# Patient Record
Sex: Female | Born: 1940 | Race: White | Hispanic: No | Marital: Single | Smoking: Never smoker
Health system: Southern US, Community
[De-identification: ages and names within clinical notes are randomized; demographics above are authoritative.]

## PROBLEM LIST (undated history)

## (undated) DIAGNOSIS — N289 Disorder of kidney and ureter, unspecified: Secondary | ICD-10-CM

## (undated) DIAGNOSIS — S82091A Other fracture of right patella, initial encounter for closed fracture: Secondary | ICD-10-CM

## (undated) DIAGNOSIS — I82409 Acute embolism and thrombosis of unspecified deep veins of unspecified lower extremity: Secondary | ICD-10-CM

## (undated) DIAGNOSIS — I10 Essential (primary) hypertension: Secondary | ICD-10-CM

## (undated) DIAGNOSIS — E118 Type 2 diabetes mellitus with unspecified complications: Secondary | ICD-10-CM

## (undated) DIAGNOSIS — I5042 Chronic combined systolic (congestive) and diastolic (congestive) heart failure: Secondary | ICD-10-CM

## (undated) DIAGNOSIS — F419 Anxiety disorder, unspecified: Secondary | ICD-10-CM

## (undated) DIAGNOSIS — I2510 Atherosclerotic heart disease of native coronary artery without angina pectoris: Secondary | ICD-10-CM

## (undated) DIAGNOSIS — F329 Major depressive disorder, single episode, unspecified: Secondary | ICD-10-CM

## (undated) DIAGNOSIS — M1990 Unspecified osteoarthritis, unspecified site: Secondary | ICD-10-CM

## (undated) DIAGNOSIS — F32A Depression, unspecified: Secondary | ICD-10-CM

## (undated) DIAGNOSIS — E785 Hyperlipidemia, unspecified: Secondary | ICD-10-CM

## (undated) DIAGNOSIS — G20 Parkinson's disease: Secondary | ICD-10-CM

## (undated) DIAGNOSIS — J449 Chronic obstructive pulmonary disease, unspecified: Secondary | ICD-10-CM

## (undated) DIAGNOSIS — N184 Chronic kidney disease, stage 4 (severe): Secondary | ICD-10-CM

## (undated) DIAGNOSIS — G20A1 Parkinson's disease without dyskinesia, without mention of fluctuations: Secondary | ICD-10-CM

## (undated) HISTORY — DX: Type 2 diabetes mellitus with unspecified complications: E11.8

## (undated) HISTORY — PX: NECK SURGERY: SHX720

## (undated) HISTORY — DX: Acute embolism and thrombosis of unspecified deep veins of unspecified lower extremity: I82.409

## (undated) HISTORY — PX: BACK SURGERY: SHX140

## (undated) HISTORY — DX: Major depressive disorder, single episode, unspecified: F32.9

## (undated) HISTORY — DX: Depression, unspecified: F32.A

## (undated) HISTORY — DX: Anxiety disorder, unspecified: F41.9

## (undated) HISTORY — DX: Unspecified osteoarthritis, unspecified site: M19.90

## (undated) HISTORY — DX: Chronic combined systolic (congestive) and diastolic (congestive) heart failure: I50.42

## (undated) HISTORY — PX: CORONARY ANGIOPLASTY WITH STENT PLACEMENT: SHX49

---

## 2016-07-12 ENCOUNTER — Emergency Department
Admission: EM | Admit: 2016-07-12 | Discharge: 2016-07-12 | Disposition: A | Discharge status: Home / Self Care | Payer: Medicare Other | Attending: Emergency Medicine | Admitting: Emergency Medicine

## 2016-07-12 ENCOUNTER — Encounter: Payer: Self-pay | Admitting: Emergency Medicine

## 2016-07-12 ENCOUNTER — Emergency Department: Payer: Medicare Other

## 2016-07-12 DIAGNOSIS — N189 Chronic kidney disease, unspecified: Secondary | ICD-10-CM | POA: Diagnosis not present

## 2016-07-12 DIAGNOSIS — N950 Postmenopausal bleeding: Secondary | ICD-10-CM | POA: Diagnosis not present

## 2016-07-12 DIAGNOSIS — I2510 Atherosclerotic heart disease of native coronary artery without angina pectoris: Secondary | ICD-10-CM | POA: Diagnosis not present

## 2016-07-12 DIAGNOSIS — N939 Abnormal uterine and vaginal bleeding, unspecified: Secondary | ICD-10-CM | POA: Diagnosis present

## 2016-07-12 DIAGNOSIS — I129 Hypertensive chronic kidney disease with stage 1 through stage 4 chronic kidney disease, or unspecified chronic kidney disease: Secondary | ICD-10-CM | POA: Insufficient documentation

## 2016-07-12 HISTORY — DX: Essential (primary) hypertension: I10

## 2016-07-12 HISTORY — DX: Atherosclerotic heart disease of native coronary artery without angina pectoris: I25.10

## 2016-07-12 LAB — COMPREHENSIVE METABOLIC PANEL
ALBUMIN: 3.7 g/dL (ref 3.5–5.0)
ALT: 10 U/L — AB (ref 14–54)
AST: 14 U/L — AB (ref 15–41)
Alkaline Phosphatase: 61 U/L (ref 38–126)
Anion gap: 3 — ABNORMAL LOW (ref 5–15)
BUN: 40 mg/dL — AB (ref 6–20)
CHLORIDE: 103 mmol/L (ref 101–111)
CO2: 32 mmol/L (ref 22–32)
Calcium: 8.8 mg/dL — ABNORMAL LOW (ref 8.9–10.3)
Creatinine, Ser: 2.11 mg/dL — ABNORMAL HIGH (ref 0.44–1.00)
GFR calc Af Amer: 25 mL/min — ABNORMAL LOW (ref >60)
GFR, EST NON AFRICAN AMERICAN: 22 mL/min — AB (ref >60)
GLUCOSE: 365 mg/dL — AB (ref 65–99)
POTASSIUM: 4.6 mmol/L (ref 3.5–5.1)
SODIUM: 138 mmol/L (ref 135–145)
Total Bilirubin: 1.5 mg/dL — ABNORMAL HIGH (ref 0.3–1.2)
Total Protein: 6.5 g/dL (ref 6.5–8.1)

## 2016-07-12 LAB — URINALYSIS COMPLETE WITH MICROSCOPIC (ARMC ONLY)
Bilirubin Urine: NEGATIVE
Glucose, UA: >500 mg/dL — AB
Ketones, ur: NEGATIVE mg/dL
Nitrite: NEGATIVE
PH: 6 (ref 5.0–8.0)
PROTEIN: 100 mg/dL — AB
SQUAMOUS EPITHELIAL / LPF: NONE SEEN
Specific Gravity, Urine: 1.008 (ref 1.005–1.030)

## 2016-07-12 LAB — WET PREP, GENITAL
Clue Cells Wet Prep HPF POC: NONE SEEN
Sperm: NONE SEEN
Trich, Wet Prep: NONE SEEN
Yeast Wet Prep HPF POC: NONE SEEN

## 2016-07-12 LAB — CBC WITH DIFFERENTIAL/PLATELET
BASOS ABS: 0.1 10*3/uL (ref 0–0.1)
BASOS PCT: 1 %
EOS ABS: 0.2 10*3/uL (ref 0–0.7)
EOS PCT: 3 %
HCT: 36.5 % (ref 35.0–47.0)
Hemoglobin: 12.4 g/dL (ref 12.0–16.0)
Lymphocytes Relative: 18 %
Lymphs Abs: 1.1 10*3/uL (ref 1.0–3.6)
MCH: 29.7 pg (ref 26.0–34.0)
MCHC: 33.8 g/dL (ref 32.0–36.0)
MCV: 87.8 fL (ref 80.0–100.0)
MONO ABS: 0.4 10*3/uL (ref 0.2–0.9)
Monocytes Relative: 7 %
Neutro Abs: 4.2 10*3/uL (ref 1.4–6.5)
Neutrophils Relative %: 71 %
PLATELETS: 169 10*3/uL (ref 150–440)
RBC: 4.16 MIL/uL (ref 3.80–5.20)
RDW: 13.8 % (ref 11.5–14.5)
WBC: 5.9 10*3/uL (ref 3.6–11.0)

## 2016-07-12 LAB — TYPE AND SCREEN
ABO/RH(D): A NEG
ANTIBODY SCREEN: NEGATIVE

## 2016-07-12 LAB — CHLAMYDIA/NGC RT PCR (ARMC ONLY)
CHLAMYDIA TR: NOT DETECTED
N GONORRHOEAE: NOT DETECTED

## 2016-07-12 NOTE — ED Provider Notes (Signed)
[facility] Emergency Department Provider Note   ____________________________________________   First MD Initiated Contact with Patient 07/12/16 1540     (approximate)  I have reviewed the triage vital signs and the nursing notes.   HISTORY  Chief Complaint Rectal Bleeding   HPI Molly Wolf is a 75 y.o. female with a history of vaginal bleeding 3 months ago who is presenting with vaginal bleeding today. Says that she found blood in her underwear today. She is denying any pain. However, she does say that she had vaginal bleeding 2 months ago and had a workup but said "they found nothing." She says that she also has a 75 pound weight loss over the past year. Denies any pain. Denies any diarrhea, nausea or vomiting.   No past medical history on file.  There are no active problems to display for this patient.   No past surgical history on file.  Prior to Admission medications   Not on File    Allergies Review of patient's allergies indicates no known allergies.  No family history on file.  Social History Social History  Substance Use Topics  . Smoking status: Not on file  . Smokeless tobacco: Not on file  . Alcohol use Not on file    Review of Systems Constitutional: No fever/chills Eyes: No visual changes. ENT: No sore throat. Cardiovascular: Denies chest pain. Respiratory: Denies shortness of breath. Gastrointestinal: No abdominal pain.  No nausea, no vomiting.  No diarrhea.  No constipation. Genitourinary: Negative for dysuria. Musculoskeletal: Negative for back pain. Skin: Negative for rash. Neurological: Negative for headaches, focal weakness or numbness.  10-point ROS otherwise negative.  ____________________________________________   PHYSICAL EXAM:  VITAL SIGNS: ED Triage Vitals  Enc Vitals Group     BP 07/12/16 1532 (!) 141/68     Pulse Rate 07/12/16 1532 62     Resp 07/12/16 1532 18     Temp 07/12/16 1532 97.6 F  (36.4 C)     Temp Source 07/12/16 1532 Oral     SpO2 07/12/16 1532 99 %     Weight 07/12/16 1536 164 lb (74.4 kg)     Height 07/12/16 1536 5\' 9"  (1.753 m)     Head Circumference --      Peak Flow --      Pain Score --      Pain Loc --      Pain Edu? --      Excl. in GC? --     Constitutional: Alert and oriented. Well appearing and in no acute distress. Eyes: Conjunctivae are normal. PERRL. EOMI. Head: Atraumatic. Nose: No congestion/rhinnorhea. Mouth/Throat: Mucous membranes are moist.  Neck: No stridor.   Cardiovascular: Normal rate, regular rhythm. Grossly normal heart sounds.   Respiratory: Normal respiratory effort.  No retractions. Lungs CTAB. Gastrointestinal: Soft and nontender. No distention. Trace heme positive brown stool. Genitourinary:  Blood clot seen at the introitus. Speculum exam with a small to moderate amount of brown blood in the vagina but without any pooling. Bimanual exam without any tenderness. No masses palpated or tenderness to the adnexae or the uterus. \Musculoskeletal: No lower extremity tenderness nor edema.  No joint effusions. Neurologic:  Normal speech and language. No gross focal neurologic deficits are appreciated.  Skin:  Skin is warm, dry and intact. No rash noted. Psychiatric: Mood and affect are normal. Speech and behavior are normal.  ____________________________________________   LABS (all labs ordered are listed, but only abnormal results are displayed)  Labs Reviewed  WET PREP, GENITAL - Abnormal; Notable for the following:       Result Value   WBC, Wet Prep HPF POC FEW (*)    All other components within normal limits  COMPREHENSIVE METABOLIC PANEL - Abnormal; Notable for the following:    Glucose, Bld 365 (*)    BUN 40 (*)    Creatinine, Ser 2.11 (*)    Calcium 8.8 (*)    AST 14 (*)    ALT 10 (*)    Total Bilirubin 1.5 (*)    GFR calc non Af Amer 22 (*)    GFR calc Af Amer 25 (*)    Anion gap 3 (*)    All other components  within normal limits  URINALYSIS COMPLETEWITH MICROSCOPIC ([facility] ONLY) - Abnormal; Notable for the following:    Color, Urine YELLOW (*)    APPearance CLOUDY (*)    Glucose, UA >500 (*)    Hgb urine dipstick 3+ (*)    Protein, ur 100 (*)    Leukocytes, UA 1+ (*)    Bacteria, UA RARE (*)    All other components within normal limits  CHLAMYDIA/NGC RT PCR ([facility] ONLY)  URINE CULTURE  CBC WITH DIFFERENTIAL/PLATELET  TYPE AND SCREEN   ____________________________________________  EKG   ____________________________________________  RADIOLOGY  US Transvaginal Non-OB (Accession [id number]) (Order [id number])  Imaging  Date: 07/12/2016 Department: [facility] EMERGENCY DEPARTMENT Released By/Authorizing: Myrna Blazer, MD (auto-released)  Exam Information   Status Exam Begun  Exam Ended   Final [99] 07/12/2016 4:20 PM 07/12/2016 5:16 PM  PACS Images   Show images for US Transvaginal Non-OB  Study Result   CLINICAL DATA:  Postmenopausal bleeding.  EXAM: TRANSABDOMINAL AND TRANSVAGINAL ULTRASOUND OF PELVIS  TECHNIQUE: Both transabdominal and transvaginal ultrasound examinations of the pelvis were performed. Transabdominal technique was performed for global imaging of the pelvis including uterus, ovaries, adnexal regions, and pelvic cul-de-sac. It was necessary to proceed with endovaginal exam following the transabdominal exam to visualize the endometrium and ovaries.  COMPARISON:  None  FINDINGS: Uterus  Measurements: 6.8 x 4.2 x 3.1 cm. No fibroids or other mass visualized. Calcifications are seen posteriorly within the uterus.  Endometrium  Thickness: 2.6 mm.  No focal abnormality visualized.  Right ovary  Not visualized.  Left ovary  Not visualized.  Other findings  No abnormal free fluid.  IMPRESSION: Uterine calcifications are noted. Ovaries are not visualized. Endometrial thickness within  normal limits. In the setting of post-menopausal bleeding, this is consistent with a benign etiology such as endometrial atrophy. If bleeding remains unresponsive to hormonal or medical therapy, sonohysterogram should be considered for focal lesion work-up. (Ref: Radiological Reasoning: Algorithmic Workup of Abnormal Vaginal Bleeding with Endovaginal Sonography and Sonohysterography. AJR 2008; [id number]:[id number])   Electronically Signed   By: Lupita Raider, M.D.   On: 07/12/2016 17:26     ____________________________________________   PROCEDURES  Procedure(s) performed:   Procedures  Critical Care performed:   ____________________________________________   INITIAL IMPRESSION / ASSESSMENT AND PLAN / ED COURSE  Pertinent labs & imaging results that were available during my care of the patient were reviewed by me and considered in my medical decision making (see chart for details).  ----------------------------------------- 7:18 PM on 07/12/2016 -----------------------------------------  Patient is resting comfortably at this time. I discussed the case with Dr. Bonney Aid of the OB/GYN service who agrees that this may be a result of uterine atrophy causing bleeding. However, he does recommend  that the patient is seen in clinic for evaluation and possible biopsy. I explained the results of the imaging as well as lab results to the family as well as the patient are understanding of the plan and willing to comply. The patient also had a urinalysis which showed too numerous to count red blood cells as well as white blood cells. The patient denies any burning with urination or frequency and says that she was just treated for UTI. She is denying any issues with her urination at this time. I will send a culture but will not treat her urine with antibiotics empirically. She also had a creatinine of 2.11 but has known kidney disease. The family and the patient do not know her baseline  creatinine. I recommended that they follow up outpatient to make sure that there is a worsening of the kidney function. I will not be doing a further workup for her kidney function at this time is I will assume this is close to her baseline because of her known kidney disease.  Clinical Course     ____________________________________________   FINAL CLINICAL IMPRESSION(S) / ED DIAGNOSES  Final diagnoses:  Vaginal bleeding  Chronic kidney disease.    NEW MEDICATIONS STARTED DURING THIS VISIT:  New Prescriptions   No medications on file     Note:  This document was prepared using Dragon voice recognition software and may include unintentional dictation errors.    Myrna Blazeravid Matthew Schaevitz, MD 07/12/16 Jerene Bears1920

## 2016-07-12 NOTE — ED Triage Notes (Signed)
Pt states that while she was shower this afternoon she noticed blood running down into the tub, states that she thinks the bleeding is from her rectum, denies any pain and states she has never had bleeding from her rectum, pt also c/o weakness and thinks she has lost a lot of weight lately, pt also states that she is staying with different family members

## 2016-07-12 NOTE — ED Notes (Signed)
In ultrasound

## 2016-07-15 LAB — URINE CULTURE: Culture: >=100000 — AB

## 2016-07-16 ENCOUNTER — Telehealth: Payer: Self-pay | Admitting: Pharmacist

## 2016-07-16 NOTE — Telephone Encounter (Signed)
Entered in error  Bari MantisKristin Kolin Erdahl PharmD Clinical Pharmacist 07/16/2016

## 2016-07-16 NOTE — Progress Notes (Signed)
07/16/16 at 1434: Called patient and left message concerning Urine CX from ED visit 07/12/16.  Patient phone # [phone]   Per Dr. Alphonzo LemmingsMcShane: Plan Rx for Cephalexin 250mg  po bid x 7 days  (patient with Crcl 24 ml/min at ED visit).  Per ED notes pt had no burning/frequency but stated she had recently been treated for UTI. IN ER for vaginal bleeding.   Results for orders placed or performed during the hospital encounter of 07/12/16  Wet prep, genital     Status: Abnormal   Collection Time: 07/12/16  3:41 PM  Result Value Ref Range Status   Yeast Wet Prep HPF POC NONE SEEN NONE SEEN Final   Trich, Wet Prep NONE SEEN NONE SEEN Final   Clue Cells Wet Prep HPF POC NONE SEEN NONE SEEN Final   WBC, Wet Prep HPF POC FEW (A) NONE SEEN Final   Sperm NONE SEEN  Final  Chlamydia/NGC rt PCR ([facility] only)     Status: None   Collection Time: 07/12/16  3:41 PM  Result Value Ref Range Status   Specimen source GC/Chlam SWAB  Final   Chlamydia Tr NOT DETECTED NOT DETECTED Final   N gonorrhoeae NOT DETECTED NOT DETECTED Final    Comment: (NOTE) 100  This methodology has not been evaluated in pregnant women or in 200  patients with a history of hysterectomy. 300 400  This methodology will not be performed on patients less than 3214  years of age.   Urine culture     Status: Abnormal   Collection Time: 07/12/16  5:51 PM  Result Value Ref Range Status   Specimen Description URINE, RANDOM  Final   Special Requests NONE  Final   Culture >=100,000 COLONIES/mL ESCHERICHIA COLI (A)  Final   Report Status 07/15/2016 FINAL  Final   Organism ID, Bacteria ESCHERICHIA COLI (A)  Final      Susceptibility   Escherichia coli - MIC*    AMPICILLIN >=32 RESISTANT Resistant     CEFAZOLIN 16 SENSITIVE Sensitive     CEFTRIAXONE <=1 SENSITIVE Sensitive     CIPROFLOXACIN >=4 RESISTANT Resistant     GENTAMICIN <=1 SENSITIVE Sensitive     IMIPENEM <=0.25 SENSITIVE Sensitive     NITROFURANTOIN <=16 SENSITIVE Sensitive      TRIMETH/SULFA >=320 RESISTANT Resistant     AMPICILLIN/SULBACTAM 16 INTERMEDIATE Intermediate     PIP/TAZO <=4 SENSITIVE Sensitive     Extended ESBL NEGATIVE Sensitive     * >=100,000 COLONIES/mL ESCHERICHIA COLI    Bari MantisKristin Glorianna Gott PharmD Clinical Pharmacist 07/16/2016

## 2016-07-17 NOTE — Progress Notes (Signed)
07/17/16 at 1500- 2nd attempt to contact patient concerning ER culture report. Left voicemail.  Bari MantisKristin Gael Londo PharmD Clinical Pharmacist 07/17/2016

## 2016-10-25 ENCOUNTER — Emergency Department (HOSPITAL_COMMUNITY): Payer: Medicare Other

## 2016-10-25 ENCOUNTER — Encounter (HOSPITAL_COMMUNITY): Payer: Self-pay | Admitting: Emergency Medicine

## 2016-10-25 ENCOUNTER — Emergency Department (HOSPITAL_COMMUNITY)
Admission: EM | Admit: 2016-10-25 | Discharge: 2016-10-25 | Disposition: A | Discharge status: Home / Self Care | Payer: Medicare Other | Attending: Emergency Medicine | Admitting: Emergency Medicine

## 2016-10-25 DIAGNOSIS — S0101XA Laceration without foreign body of scalp, initial encounter: Secondary | ICD-10-CM | POA: Diagnosis not present

## 2016-10-25 DIAGNOSIS — Z955 Presence of coronary angioplasty implant and graft: Secondary | ICD-10-CM | POA: Diagnosis not present

## 2016-10-25 DIAGNOSIS — S0990XA Unspecified injury of head, initial encounter: Secondary | ICD-10-CM | POA: Diagnosis present

## 2016-10-25 DIAGNOSIS — W228XXA Striking against or struck by other objects, initial encounter: Secondary | ICD-10-CM | POA: Diagnosis not present

## 2016-10-25 DIAGNOSIS — Y92009 Unspecified place in unspecified non-institutional (private) residence as the place of occurrence of the external cause: Secondary | ICD-10-CM | POA: Insufficient documentation

## 2016-10-25 DIAGNOSIS — Y999 Unspecified external cause status: Secondary | ICD-10-CM | POA: Insufficient documentation

## 2016-10-25 DIAGNOSIS — I2510 Atherosclerotic heart disease of native coronary artery without angina pectoris: Secondary | ICD-10-CM | POA: Insufficient documentation

## 2016-10-25 DIAGNOSIS — I129 Hypertensive chronic kidney disease with stage 1 through stage 4 chronic kidney disease, or unspecified chronic kidney disease: Secondary | ICD-10-CM | POA: Insufficient documentation

## 2016-10-25 DIAGNOSIS — E1122 Type 2 diabetes mellitus with diabetic chronic kidney disease: Secondary | ICD-10-CM | POA: Diagnosis not present

## 2016-10-25 DIAGNOSIS — Z794 Long term (current) use of insulin: Secondary | ICD-10-CM | POA: Insufficient documentation

## 2016-10-25 DIAGNOSIS — N189 Chronic kidney disease, unspecified: Secondary | ICD-10-CM | POA: Insufficient documentation

## 2016-10-25 DIAGNOSIS — G20 Parkinson's disease: Secondary | ICD-10-CM | POA: Insufficient documentation

## 2016-10-25 DIAGNOSIS — Z23 Encounter for immunization: Secondary | ICD-10-CM | POA: Diagnosis not present

## 2016-10-25 DIAGNOSIS — W19XXXA Unspecified fall, initial encounter: Secondary | ICD-10-CM

## 2016-10-25 DIAGNOSIS — Y939 Activity, unspecified: Secondary | ICD-10-CM | POA: Diagnosis not present

## 2016-10-25 HISTORY — DX: Hyperlipidemia, unspecified: E78.5

## 2016-10-25 HISTORY — DX: Parkinson's disease: G20

## 2016-10-25 HISTORY — DX: Parkinson's disease without dyskinesia, without mention of fluctuations: G20.A1

## 2016-10-25 LAB — URINALYSIS, ROUTINE W REFLEX MICROSCOPIC
Bilirubin Urine: NEGATIVE
HGB URINE DIPSTICK: NEGATIVE
Ketones, ur: NEGATIVE mg/dL
LEUKOCYTES UA: NEGATIVE
NITRITE: NEGATIVE
PH: 6 (ref 5.0–8.0)
Protein, ur: 100 mg/dL — AB
SPECIFIC GRAVITY, URINE: 1.012 (ref 1.005–1.030)
SQUAMOUS EPITHELIAL / LPF: NONE SEEN

## 2016-10-25 LAB — CBC
HCT: 33 % — ABNORMAL LOW (ref 36.0–46.0)
Hemoglobin: 10.8 g/dL — ABNORMAL LOW (ref 12.0–15.0)
MCH: 29.4 pg (ref 26.0–34.0)
MCHC: 32.7 g/dL (ref 30.0–36.0)
MCV: 89.9 fL (ref 78.0–100.0)
PLATELETS: 160 10*3/uL (ref 150–400)
RBC: 3.67 MIL/uL — ABNORMAL LOW (ref 3.87–5.11)
RDW: 12.7 % (ref 11.5–15.5)
WBC: 4.1 10*3/uL (ref 4.0–10.5)

## 2016-10-25 LAB — BASIC METABOLIC PANEL
Anion gap: 9 (ref 5–15)
BUN: 48 mg/dL — AB (ref 6–20)
CALCIUM: 8.6 mg/dL — AB (ref 8.9–10.3)
CO2: 26 mmol/L (ref 22–32)
CREATININE: 2.42 mg/dL — AB (ref 0.44–1.00)
Chloride: 101 mmol/L (ref 101–111)
GFR, EST AFRICAN AMERICAN: 21 mL/min — AB (ref >60)
GFR, EST NON AFRICAN AMERICAN: 18 mL/min — AB (ref >60)
Glucose, Bld: 396 mg/dL — ABNORMAL HIGH (ref 65–99)
Potassium: 4.5 mmol/L (ref 3.5–5.1)
SODIUM: 136 mmol/L (ref 135–145)

## 2016-10-25 LAB — CBG MONITORING, ED: GLUCOSE-CAPILLARY: 375 mg/dL — AB (ref 65–99)

## 2016-10-25 MED ORDER — HYDROCODONE-ACETAMINOPHEN 5-325 MG PO TABS
1.0000 | ORAL_TABLET | Freq: Once | ORAL | Status: AC
  Administered 2016-10-25: 1 via ORAL
  Filled 2016-10-25: qty 1

## 2016-10-25 MED ORDER — LIDOCAINE HCL (PF) 1 % IJ SOLN
5.0000 mL | Freq: Once | INTRAMUSCULAR | Status: AC
  Administered 2016-10-25: 5 mL via INTRADERMAL
  Filled 2016-10-25: qty 5

## 2016-10-25 MED ORDER — SODIUM CHLORIDE 0.9 % IV BOLUS (SEPSIS)
500.0000 mL | Freq: Once | INTRAVENOUS | Status: AC
  Administered 2016-10-25: 500 mL via INTRAVENOUS

## 2016-10-25 MED ORDER — TETANUS-DIPHTH-ACELL PERTUSSIS 5-2.5-18.5 LF-MCG/0.5 IM SUSP
0.5000 mL | Freq: Once | INTRAMUSCULAR | Status: AC
  Administered 2016-10-25: 0.5 mL via INTRAMUSCULAR
  Filled 2016-10-25: qty 0.5

## 2016-10-25 NOTE — ED Triage Notes (Signed)
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

## 2016-10-25 NOTE — ED Notes (Signed)
ED Provider at bedside. 

## 2016-10-25 NOTE — ED Notes (Signed)
Patient transported to CT 

## 2016-10-25 NOTE — ED Provider Notes (Addendum)
[facility]-EMERGENCY DEPT Provider Note   CSN: [id number] Arrival date & time: 10/25/16  0128     History   Chief Complaint Chief Complaint  Patient presents with  . Fall    head laceration    HPI   Blood pressure 164/82, pulse 80, temperature 97.7 F (36.5 C), temperature source Oral, resp. rate 16, height 5\' 7"  (1.702 m), weight 74.4 kg, SpO2 97 %.  Molly Wolf is a 76 y.o. female with past medical history significant for insulin-dependent diabetes, hypertension, hyperlipidemia, chronic kidney disease complaining of fall with head trauma. She takes Plavix, there was no loss of consciousness. She reports severe pain to the occipital area of the head where she sustained a cut, bleeding has been difficult to control. She denies cervicalgia, change in vision, dysarthria, ataxia, chest pain. There is no particular prodrome before the fall she states she falls frequently with the issues with her Parkinson's. She lives at home and is cared for by her grandson. She feels constantly lightheaded this is unchanged from prior. She's been eating and drinking normally, denies fever, chills, nausea, vomiting. She states that she is urinating more frequently and has dysuria as well. As per daughter she is difficult to control diabetes and her blood sugars normally run around 200.  Past Medical History:  Diagnosis Date  . Chronic kidney disease   . Coronary artery disease   . Diabetes mellitus without complication (HCC)   . Hyperlipidemia   . Hypertension   . Parkinson disease (HCC)   . Renal disorder     There are no active problems to display for this patient.   Past Surgical History:  Procedure Laterality Date  . CORONARY ANGIOPLASTY WITH STENT PLACEMENT      OB History    No data available       Home Medications    Prior to Admission medications   Medication Sig Start Date End Date Taking? Authorizing Provider  acetaminophen (TYLENOL) 325 MG tablet Take 650 mg by mouth every 6  (six) hours as needed for mild pain.   Yes Historical Provider, MD  atorvastatin (LIPITOR) 20 MG tablet Take 20 mg by mouth daily.   Yes Historical Provider, MD  Carbidopa-Levodopa ER (SINEMET CR) 25-100 MG tablet controlled release Take 2 tablets by mouth 4 (four) times daily.   Yes Historical Provider, MD  clopidogrel (PLAVIX) 75 MG tablet Take 75 mg by mouth daily.   Yes Historical Provider, MD  Coenzyme Q10 (COQ10) 50 MG CAPS Take 50 mg by mouth daily.   Yes Historical Provider, MD  CRANBERRY PO Take 1 tablet by mouth daily.   Yes Historical Provider, MD  escitalopram (LEXAPRO) 20 MG tablet Take 20 mg by mouth daily.   Yes Historical Provider, MD  furosemide (LASIX) 20 MG tablet Take 20 mg by mouth daily.   Yes Historical Provider, MD  gabapentin (NEURONTIN) 300 MG capsule Take 300 mg by mouth at bedtime.   Yes Historical Provider, MD  hydrALAZINE (APRESOLINE) 25 MG tablet Take 25 mg by mouth 3 (three) times daily.   Yes Historical Provider, MD  insulin glargine (LANTUS) 100 UNIT/ML injection Inject 40 Units into the skin at bedtime.   Yes Historical Provider, MD  insulin lispro (HUMALOG) 100 UNIT/ML injection Inject 6 Units into the skin 3 (three) times daily before meals.   Yes Historical Provider, MD  isosorbide mononitrate (IMDUR) 60 MG 24 hr tablet Take 60 mg by mouth daily.   Yes Historical Provider, MD  loratadine (  CLARITIN) 10 MG tablet Take 5 mg by mouth daily.   Yes Historical Provider, MD  LORazepam (ATIVAN) 1 MG tablet Take 1 mg by mouth 2 (two) times daily.   Yes Historical Provider, MD  metoprolol tartrate (LOPRESSOR) 25 MG tablet Take 12.5 mg by mouth 2 (two) times daily.   Yes Historical Provider, MD  mirabegron ER (MYRBETRIQ) 25 MG TB24 tablet Take 25 mg by mouth daily.   Yes Historical Provider, MD  nitroGLYCERIN (NITROSTAT) 0.4 MG SL tablet Place 0.4 mg under the tongue every 5 (five) minutes as needed for chest pain.   Yes Historical Provider, MD  Omega-3 Fatty Acids (FISH  OIL) 1000 MG CAPS Take 1,000 mg by mouth daily.   Yes Historical Provider, MD  polyethylene glycol (MIRALAX / GLYCOLAX) packet Take 17 g by mouth daily as needed for mild constipation.   Yes Historical Provider, MD  promethazine (PHENERGAN) 25 MG tablet Take 25 mg by mouth every 6 (six) hours as needed for nausea or vomiting.   Yes Historical Provider, MD  saccharomyces boulardii (FLORASTOR) 250 MG capsule Take 250 mg by mouth daily.   Yes Historical Provider, MD  traMADol (ULTRAM) 50 MG tablet Take 50 mg by mouth daily as needed for moderate pain.   Yes Historical Provider, MD  traZODone (DESYREL) 50 MG tablet Take 75 mg by mouth at bedtime.   Yes Historical Provider, MD    Family History History reviewed. No pertinent family history.  Social History Social History  Substance Use Topics  . Smoking status: Never Smoker  . Smokeless tobacco: Never Used  . Alcohol use No     Allergies   Patient has no known allergies.   Review of Systems Review of Systems   10 systems reviewed and found to be negative, except as noted in the HPI.   Physical Exam Updated Vital Signs BP 139/79   Pulse 82   Temp 97.7 F (36.5 C) (Oral)   Resp 16   Ht 5\' 7"  (1.702 m)   Wt 74.4 kg   SpO2 96%   BMI 25.69 kg/m   Physical Exam  Constitutional: She is oriented to person, place, and time. She appears well-developed and well-nourished. No distress.  HENT:  Head: Normocephalic.  Mouth/Throat: Oropharynx is clear and moist.  Large hematoma to occipital area there is a half centimeter laceration with bright red blood, no pulsatile bleeding.   Eyes: Conjunctivae and EOM are normal. Pupils are equal, round, and reactive to light.  Neck: Normal range of motion.  No midline C-spine  tenderness to palpation or step-offs appreciated. Patient has full range of motion without pain.  Grip strength, biceps, triceps 5/5 bilaterally;  can differentiate between pinprick and light touch bilaterally.     Cardiovascular: Normal rate, regular rhythm and intact distal pulses.   Pulmonary/Chest: Effort normal and breath sounds normal. No respiratory distress. She has no wheezes. She has no rales. She exhibits no tenderness.  No tenderness to palpation or crepitance along the chest  Abdominal: Soft. She exhibits no distension and no mass. There is no tenderness. There is no rebound and no guarding.  Musculoskeletal: Normal range of motion.  No tenderness to palpation along the greater trochanter, patient can flex both hips. Tenderness palpation along knees ankles shoulders elbows or any major joints.  Neurological: She is alert and oriented to person, place, and time.  Positive resting tremor in the head and bilateral upper extremities.   Skin: She is not diaphoretic.  Psychiatric:  She has a normal mood and affect.  Nursing note and vitals reviewed.    ED Treatments / Results  Labs (all labs ordered are listed, but only abnormal results are displayed) Labs Reviewed  BASIC METABOLIC PANEL - Abnormal; Notable for the following:       Result Value   Glucose, Bld 396 (*)    BUN 48 (*)    Creatinine, Ser 2.42 (*)    Calcium 8.6 (*)    GFR calc non Af Amer 18 (*)    GFR calc Af Amer 21 (*)    All other components within normal limits  CBC - Abnormal; Notable for the following:    RBC 3.67 (*)    Hemoglobin 10.8 (*)    HCT 33.0 (*)    All other components within normal limits  URINALYSIS, ROUTINE W REFLEX MICROSCOPIC - Abnormal; Notable for the following:    Color, Urine STRAW (*)    Glucose, UA >=500 (*)    Protein, ur 100 (*)    Bacteria, UA RARE (*)    All other components within normal limits  CBG MONITORING, ED - Abnormal; Notable for the following:    Glucose-Capillary 375 (*)    All other components within normal limits    EKG  EKG Interpretation  Date/Time:  Sunday October 25 2016 01:56:17 EST Ventricular Rate:  85 PR Interval:    QRS Duration: 108 QT  Interval:  420 QTC Calculation: 500 R Axis:   -26 Text Interpretation:  Sinus rhythm Incomplete left bundle branch block LVH with secondary repolarization abnormality Anterior Q waves, possibly due to LVH Baseline wander in lead(s) V3 Confirmed by DELO  MD, DOUGLAS ([id number]) on 10/25/2016 2:29:12 AM       Radiology Ct Head Wo Contrast  Result Date: 10/25/2016 CLINICAL DATA:  Status post fall. Hit back of head on floor. Large laceration at the back of the head. Concern for cervical spine injury. Initial encounter. EXAM: CT HEAD WITHOUT CONTRAST CT CERVICAL SPINE WITHOUT CONTRAST TECHNIQUE: Multidetector CT imaging of the head and cervical spine was performed following the standard protocol without intravenous contrast. Multiplanar CT image reconstructions of the cervical spine were also generated. COMPARISON:  None. FINDINGS: CT HEAD FINDINGS Brain: No evidence of acute infarction, hemorrhage, hydrocephalus, extra-axial collection or mass lesion/mass effect. Prominence of the ventricles and sulci reflects mild cortical volume loss. Scattered periventricular and subcortical white matter change likely reflects small vessel ischemic microangiopathy. The brainstem and fourth ventricle are within normal limits. The basal ganglia are unremarkable in appearance. The cerebral hemispheres demonstrate grossly normal gray-white differentiation. No mass effect or midline shift is seen. Vascular: No hyperdense vessel or unexpected calcification. Skull: There is no evidence of fracture; visualized osseous structures are unremarkable in appearance. Sinuses/Orbits: The orbits are within normal limits. The paranasal sinuses and mastoid air cells are well-aerated. Other: A soft tissue laceration is noted at the occiput, with associated soft tissue swelling. CT CERVICAL SPINE FINDINGS Alignment: Normal. Skull base and vertebrae: No acute fracture. No primary bone lesion or focal pathologic process. Soft tissues and spinal  canal: No prevertebral fluid or swelling. No visible canal hematoma. Disc levels: Multiple disc space narrowing is noted along the lower cervical spine, with associated anterior and posterior disc osteophyte complexes. Mild degenerative change is noted about the dens. There is mild chronic deformity at vertebral body C4, reflecting remote injury. Upper chest: Calcification is seen at the carotid bifurcations bilaterally. The visualized lung apices are grossly clear. The  thyroid gland is grossly unremarkable in appearance. Other: No additional soft tissue abnormalities are seen. IMPRESSION: 1. No evidence of traumatic intracranial injury or fracture. 2. No evidence of fracture or subluxation along the cervical spine. 3. Soft tissue laceration at the occiput, with associated soft tissue swelling. 4. Mild cortical volume loss and scattered small vessel ischemic microangiopathy. 5. Degenerative change along the lower cervical spine. Mild chronic deformity of vertebral body C4, reflecting remote injury. 6. Calcification at the carotid bifurcations bilaterally. Carotid ultrasound would be helpful for further evaluation, when and as deemed clinically appropriate. Electronically Signed   By: Roanna Raider M.D.   On: 10/25/2016 03:43   Ct Cervical Spine Wo Contrast  Result Date: 10/25/2016 CLINICAL DATA:  Status post fall. Hit back of head on floor. Large laceration at the back of the head. Concern for cervical spine injury. Initial encounter. EXAM: CT HEAD WITHOUT CONTRAST CT CERVICAL SPINE WITHOUT CONTRAST TECHNIQUE: Multidetector CT imaging of the head and cervical spine was performed following the standard protocol without intravenous contrast. Multiplanar CT image reconstructions of the cervical spine were also generated. COMPARISON:  None. FINDINGS: CT HEAD FINDINGS Brain: No evidence of acute infarction, hemorrhage, hydrocephalus, extra-axial collection or mass lesion/mass effect. Prominence of the ventricles and  sulci reflects mild cortical volume loss. Scattered periventricular and subcortical white matter change likely reflects small vessel ischemic microangiopathy. The brainstem and fourth ventricle are within normal limits. The basal ganglia are unremarkable in appearance. The cerebral hemispheres demonstrate grossly normal gray-white differentiation. No mass effect or midline shift is seen. Vascular: No hyperdense vessel or unexpected calcification. Skull: There is no evidence of fracture; visualized osseous structures are unremarkable in appearance. Sinuses/Orbits: The orbits are within normal limits. The paranasal sinuses and mastoid air cells are well-aerated. Other: A soft tissue laceration is noted at the occiput, with associated soft tissue swelling. CT CERVICAL SPINE FINDINGS Alignment: Normal. Skull base and vertebrae: No acute fracture. No primary bone lesion or focal pathologic process. Soft tissues and spinal canal: No prevertebral fluid or swelling. No visible canal hematoma. Disc levels: Multiple disc space narrowing is noted along the lower cervical spine, with associated anterior and posterior disc osteophyte complexes. Mild degenerative change is noted about the dens. There is mild chronic deformity at vertebral body C4, reflecting remote injury. Upper chest: Calcification is seen at the carotid bifurcations bilaterally. The visualized lung apices are grossly clear. The thyroid gland is grossly unremarkable in appearance. Other: No additional soft tissue abnormalities are seen. IMPRESSION: 1. No evidence of traumatic intracranial injury or fracture. 2. No evidence of fracture or subluxation along the cervical spine. 3. Soft tissue laceration at the occiput, with associated soft tissue swelling. 4. Mild cortical volume loss and scattered small vessel ischemic microangiopathy. 5. Degenerative change along the lower cervical spine. Mild chronic deformity of vertebral body C4, reflecting remote injury. 6.  Calcification at the carotid bifurcations bilaterally. Carotid ultrasound would be helpful for further evaluation, when and as deemed clinically appropriate. Electronically Signed   By: Roanna Raider M.D.   On: 10/25/2016 03:43    Procedures .Marland KitchenLaceration Repair Date/Time: 10/25/2016 5:57 AM Performed by: Wynetta Emery Authorized by: Wynetta Emery   Consent:    Consent obtained:  Verbal   Consent given by:  Patient Laceration details:    Location:  Scalp   Scalp location:  Occipital   Length (cm):  0.5 Repair type:    Repair type:  Complex Treatment:    Irrigation solution:  Sterile saline  Irrigation method:  Syringe Skin repair:    Repair method:  Sutures   Suture size:  3-0   Suture material:  Prolene Post-procedure details:    Dressing:  Antibiotic ointment and bulky dressing   Patient tolerance of procedure:  Tolerated well, no immediate complications Comments:     Initially I put a staple in the wound however there continue to be a significant amount of bleeding. Staple was removed and wound was anesthetized with 1% lidocaine without epinephrine approximately 2 mL and a large figure-of-eight was placed with good hemostasis. Anabiotic ointment and bulky dressing is applied.   (including critical care time)  Medications Ordered in ED Medications  HYDROcodone-acetaminophen (NORCO/VICODIN) 5-325 MG per tablet 1 tablet (1 tablet Oral Given 10/25/16 0215)  sodium chloride 0.9 % bolus 500 mL (0 mLs Intravenous Stopped 10/25/16 0529)  Tdap (BOOSTRIX) injection 0.5 mL (0.5 mLs Intramuscular Given 10/25/16 0215)  lidocaine (PF) (XYLOCAINE) 1 % injection 5 mL (5 mLs Intradermal Given by Other 10/25/16 0406)     Initial Impression / Assessment and Plan / ED Course  I have reviewed the triage vital signs and the nursing notes.  Pertinent labs & imaging results that were available during my care of the patient were reviewed by me and considered in my medical decision making  (see chart for details).  Clinical Course     Vitals:   10/25/16 0345 10/25/16 0415 10/25/16 0445 10/25/16 0515  BP: 125/84 153/98 147/93 139/79  Pulse: 74 75 82   Resp:      Temp:      TempSrc:      SpO2: 97% 98% 94% 96%  Weight:      Height:        Medications  HYDROcodone-acetaminophen (NORCO/VICODIN) 5-325 MG per tablet 1 tablet (1 tablet Oral Given 10/25/16 0215)  sodium chloride 0.9 % bolus 500 mL (0 mLs Intravenous Stopped 10/25/16 0529)  Tdap (BOOSTRIX) injection 0.5 mL (0.5 mLs Intramuscular Given 10/25/16 0215)  lidocaine (PF) (XYLOCAINE) 1 % injection 5 mL (5 mLs Intradermal Given by Other 10/25/16 0406)    Renella CunasJudy Tokunaga is 76 y.o. female presenting with Fall at home. She does not live alone and ambulation has been increasingly difficult with her Parkinson's, she does always ambulate with a walker. She has occipital head trauma with laceration. CT head and C-spine are negative, blood work reassuring. Urinalysis is without signs of infection. She has an arterial bleed which is small in the occipital area. Figure-of-eight sutures places and bulky dressing is applied with hemostasis achieved. I've advised family to leave the dressing on for the next 24-36 hours. They can remove the dressing and clean and reapply.  Evaluation does not show pathology that would require ongoing emergent intervention or inpatient treatment. Pt is hemodynamically stable and mentating appropriately. Discussed findings and plan with patient/guardian, who agrees with care plan. All questions answered. Return precautions discussed and outpatient follow up given.      Final Clinical Impressions(s) / ED Diagnoses   Final diagnoses:  Fall, initial encounter  Laceration of scalp, initial encounter    New Prescriptions Discharge Medication List as of 10/25/2016  5:14 AM       Wynetta EmeryNicole Shun Pletz, PA-C 10/25/16 0600    Geoffery Lyonsouglas Delo, MD 10/25/16 [id number]    Wynetta EmeryNicole Zakhari Fogel, PA-C 10/25/16 [id number]      Geoffery Lyonsouglas Delo, MD 10/25/16 [phone]

## 2016-10-25 NOTE — Discharge Instructions (Signed)
Keep the dressing on the wound for the next day.  If the bleeding goes through the dressing do not remove the dressing but apply direct pressure for 10 minutes if he continues to bleed return to the emergency room for recheck.  Stitches will have to be removed in 8-10 days.

## 2016-11-06 ENCOUNTER — Emergency Department: Payer: Medicare Other

## 2016-11-06 ENCOUNTER — Emergency Department
Admission: EM | Admit: 2016-11-06 | Discharge: 2016-11-06 | Disposition: A | Discharge status: Home / Self Care | Payer: Medicare Other | Attending: Emergency Medicine | Admitting: Emergency Medicine

## 2016-11-06 DIAGNOSIS — Z794 Long term (current) use of insulin: Secondary | ICD-10-CM | POA: Insufficient documentation

## 2016-11-06 DIAGNOSIS — E11649 Type 2 diabetes mellitus with hypoglycemia without coma: Secondary | ICD-10-CM | POA: Diagnosis not present

## 2016-11-06 DIAGNOSIS — I2510 Atherosclerotic heart disease of native coronary artery without angina pectoris: Secondary | ICD-10-CM | POA: Insufficient documentation

## 2016-11-06 DIAGNOSIS — G20 Parkinson's disease: Secondary | ICD-10-CM | POA: Insufficient documentation

## 2016-11-06 DIAGNOSIS — E1122 Type 2 diabetes mellitus with diabetic chronic kidney disease: Secondary | ICD-10-CM | POA: Insufficient documentation

## 2016-11-06 DIAGNOSIS — I129 Hypertensive chronic kidney disease with stage 1 through stage 4 chronic kidney disease, or unspecified chronic kidney disease: Secondary | ICD-10-CM | POA: Insufficient documentation

## 2016-11-06 DIAGNOSIS — E162 Hypoglycemia, unspecified: Secondary | ICD-10-CM

## 2016-11-06 DIAGNOSIS — Z79899 Other long term (current) drug therapy: Secondary | ICD-10-CM | POA: Insufficient documentation

## 2016-11-06 DIAGNOSIS — N189 Chronic kidney disease, unspecified: Secondary | ICD-10-CM | POA: Insufficient documentation

## 2016-11-06 DIAGNOSIS — N3000 Acute cystitis without hematuria: Secondary | ICD-10-CM | POA: Insufficient documentation

## 2016-11-06 LAB — COMPREHENSIVE METABOLIC PANEL
ALBUMIN: 3.6 g/dL (ref 3.5–5.0)
AST: 18 U/L (ref 15–41)
Alkaline Phosphatase: 60 U/L (ref 38–126)
Anion gap: 10 (ref 5–15)
BUN: 56 mg/dL — AB (ref 6–20)
CHLORIDE: 106 mmol/L (ref 101–111)
CO2: 22 mmol/L (ref 22–32)
CREATININE: 2.4 mg/dL — AB (ref 0.44–1.00)
Calcium: 8.7 mg/dL — ABNORMAL LOW (ref 8.9–10.3)
GFR calc Af Amer: 21 mL/min — ABNORMAL LOW (ref >60)
GFR, EST NON AFRICAN AMERICAN: 19 mL/min — AB (ref >60)
GLUCOSE: 178 mg/dL — AB (ref 65–99)
POTASSIUM: 4.6 mmol/L (ref 3.5–5.1)
SODIUM: 138 mmol/L (ref 135–145)
Total Bilirubin: 0.7 mg/dL (ref 0.3–1.2)
Total Protein: 6.7 g/dL (ref 6.5–8.1)

## 2016-11-06 LAB — GLUCOSE, CAPILLARY: Glucose-Capillary: 279 mg/dL — ABNORMAL HIGH (ref 65–99)

## 2016-11-06 LAB — URINALYSIS, COMPLETE (UACMP) WITH MICROSCOPIC
Bilirubin Urine: NEGATIVE
Glucose, UA: NEGATIVE mg/dL
Ketones, ur: NEGATIVE mg/dL
Nitrite: NEGATIVE
PROTEIN: 100 mg/dL — AB
SPECIFIC GRAVITY, URINE: 1.009 (ref 1.005–1.030)
pH: 5 (ref 5.0–8.0)

## 2016-11-06 LAB — INFLUENZA PANEL BY PCR (TYPE A & B)
INFLBPCR: NEGATIVE
Influenza A By PCR: NEGATIVE

## 2016-11-06 LAB — CBC
HEMATOCRIT: 34.3 % — AB (ref 35.0–47.0)
Hemoglobin: 11.5 g/dL — ABNORMAL LOW (ref 12.0–16.0)
MCH: 30.2 pg (ref 26.0–34.0)
MCHC: 33.6 g/dL (ref 32.0–36.0)
MCV: 89.9 fL (ref 80.0–100.0)
PLATELETS: 206 10*3/uL (ref 150–440)
RBC: 3.81 MIL/uL (ref 3.80–5.20)
RDW: 13.8 % (ref 11.5–14.5)
WBC: 6.8 10*3/uL (ref 3.6–11.0)

## 2016-11-06 LAB — TROPONIN I: Troponin I: <0.03 ng/mL (ref <0.03)

## 2016-11-06 MED ORDER — CEFTRIAXONE SODIUM-DEXTROSE 1-3.74 GM-% IV SOLR
1.0000 g | Freq: Once | INTRAVENOUS | Status: AC
  Administered 2016-11-06: 1 g via INTRAVENOUS
  Filled 2016-11-06: qty 50

## 2016-11-06 MED ORDER — CEPHALEXIN 500 MG PO CAPS: 500.0000 mg | ORAL_CAPSULE | Freq: Four times a day (QID) | ORAL | 0 refills | Status: AC

## 2016-11-06 MED ORDER — CEFTRIAXONE SODIUM 1 G IJ SOLR: 1.0000 g | Freq: Once | INTRAMUSCULAR | Status: DC

## 2016-11-06 MED ORDER — BLOOD GLUCOSE MONITOR KIT: PACK | 0 refills | Status: DC

## 2016-11-06 NOTE — ED Notes (Signed)
Pt is aaox4. Pt in NAD at this time.

## 2016-11-06 NOTE — ED Provider Notes (Signed)
[facility] Emergency Department Provider Note  ____________________________________________  Time seen: Approximately 7:18 PM  I have reviewed the triage vital signs and the nursing notes.   HISTORY  Chief Complaint Hypoglycemia   HPI Ronnesha Mester is a 76 y.o. female the history of type 2 diabetes on insulin, CAD, CK-MB, hypertension, hyperlipidemia and Parkinson's disease and presents for evaluation of hypoglycemia. Patient is accompanied by her daughter who tells me the patient received her dose of insulin this afternoon and was given a hamburger to eat. Daughter left the house and when she returned a few hours later patient had not eaten the sandwich. She noticed that later on patient started to become confused and EMS was called. Fire department arrived at the scene and patient was found to have a blood glucose of 30. They gave her by mouth glucose and when EMS arrived blood glucose was 62. Patient received 150 ML of D10 en route. Patient reports that she was in her usual state of health until lunchtime today. No cough, no fever, no chills, no nausea or vomiting, no diarrhea, no chest pain or shortness of breath, no abdominal pain, no dysuria. No recent changes on patient's insulin regimen. Patient is in sliding scale and Lantus at bedtime.  Past Medical History:  Diagnosis Date  . Chronic kidney disease   . Coronary artery disease   . Diabetes mellitus without complication ([place])   . Hyperlipidemia   . Hypertension   . Parkinson disease ([place])   . Renal disorder     There are no active problems to display for this patient.   Past Surgical History:  Procedure Laterality Date  . CORONARY ANGIOPLASTY WITH STENT PLACEMENT      Prior to Admission medications   Medication Sig Start Date End Date Taking? Authorizing Provider  acetaminophen (TYLENOL) 325 MG tablet Take 650 mg by mouth every 6 (six) hours as needed for mild pain.    Historical Provider, MD    atorvastatin (LIPITOR) 20 MG tablet Take 20 mg by mouth daily.    Historical Provider, MD  blood glucose meter kit and supplies KIT Dispense based on patient and insurance preference. Use up to four times daily as directed. (FOR ICD-9 250.00, 250.01). 11/06/16   Rudene Re, MD  Carbidopa-Levodopa ER (SINEMET CR) 25-100 MG tablet controlled release Take 2 tablets by mouth 4 (four) times daily.    Historical Provider, MD  cephALEXin (KEFLEX) 500 MG capsule Take 1 capsule (500 mg total) by mouth 4 (four) times daily. 11/06/16 11/13/16  Rudene Re, MD  clopidogrel (PLAVIX) 75 MG tablet Take 75 mg by mouth daily.    Historical Provider, MD  Coenzyme Q10 (COQ10) 50 MG CAPS Take 50 mg by mouth daily.    Historical Provider, MD  CRANBERRY PO Take 1 tablet by mouth daily.    Historical Provider, MD  escitalopram (LEXAPRO) 20 MG tablet Take 20 mg by mouth daily.    Historical Provider, MD  furosemide (LASIX) 20 MG tablet Take 20 mg by mouth daily.    Historical Provider, MD  gabapentin (NEURONTIN) 300 MG capsule Take 300 mg by mouth at bedtime.    Historical Provider, MD  hydrALAZINE (APRESOLINE) 25 MG tablet Take 25 mg by mouth 3 (three) times daily.    Historical Provider, MD  insulin glargine (LANTUS) 100 UNIT/ML injection Inject 40 Units into the skin at bedtime.    Historical Provider, MD  insulin lispro (HUMALOG) 100 UNIT/ML injection Inject 6 Units into  the skin 3 (three) times daily before meals.    Historical Provider, MD  isosorbide mononitrate (IMDUR) 60 MG 24 hr tablet Take 60 mg by mouth daily.    Historical Provider, MD  loratadine (CLARITIN) 10 MG tablet Take 5 mg by mouth daily.    Historical Provider, MD  LORazepam (ATIVAN) 1 MG tablet Take 1 mg by mouth 2 (two) times daily.    Historical Provider, MD  metoprolol tartrate (LOPRESSOR) 25 MG tablet Take 12.5 mg by mouth 2 (two) times daily.    Historical Provider, MD  mirabegron ER (MYRBETRIQ) 25 MG TB24 tablet Take 25 mg by mouth  daily.    Historical Provider, MD  nitroGLYCERIN (NITROSTAT) 0.4 MG SL tablet Place 0.4 mg under the tongue every 5 (five) minutes as needed for chest pain.    Historical Provider, MD  Omega-3 Fatty Acids (FISH OIL) 1000 MG CAPS Take 1,000 mg by mouth daily.    Historical Provider, MD  polyethylene glycol (MIRALAX / GLYCOLAX) packet Take 17 g by mouth daily as needed for mild constipation.    Historical Provider, MD  promethazine (PHENERGAN) 25 MG tablet Take 25 mg by mouth every 6 (six) hours as needed for nausea or vomiting.    Historical Provider, MD  saccharomyces boulardii (FLORASTOR) 250 MG capsule Take 250 mg by mouth daily.    Historical Provider, MD  traMADol (ULTRAM) 50 MG tablet Take 50 mg by mouth daily as needed for moderate pain.    Historical Provider, MD  traZODone (DESYREL) 50 MG tablet Take 75 mg by mouth at bedtime.    Historical Provider, MD    Allergies Patient has no known allergies.  No family history on file.  Social History Social History  Substance Use Topics  . Smoking status: Never Smoker  . Smokeless tobacco: Never Used  . Alcohol use No    Review of Systems  Constitutional: Negative for fever. Eyes: Negative for visual changes. ENT: Negative for sore throat. Neck: No neck pain  Cardiovascular: Negative for chest pain. Respiratory: Negative for shortness of breath. Gastrointestinal: Negative for abdominal pain, vomiting or diarrhea. Genitourinary: Negative for dysuria. Musculoskeletal: Negative for back pain. Skin: Negative for rash. Neurological: Negative for headaches, weakness or numbness. Psych: No SI or HI  ____________________________________________   PHYSICAL EXAM:  VITAL SIGNS: ED Triage Vitals  Enc Vitals Group     BP 11/06/16 1903 (!) 147/103     Pulse Rate 11/06/16 1903 80     Resp 11/06/16 1903 16     Temp 11/06/16 1913 (!) 92.6 F (33.7 C)     Temp Source 11/06/16 1913 Rectal     SpO2 11/06/16 1903 99 %     Weight  11/06/16 1902 164 lb (74.4 kg)     Height 11/06/16 1902 5' 7"  (1.702 m)     Head Circumference --      Peak Flow --      Pain Score --      Pain Loc --      Pain Edu? --      Excl. in [place]? --     Constitutional: Alert and oriented. Well appearing and in no apparent distress. HEENT:      Head: Normocephalic and atraumatic.         Eyes: Conjunctivae are normal. Sclera is non-icteric. EOMI. PERRL      Mouth/Throat: Mucous membranes are moist.       Neck: Supple with no signs of meningismus. Cardiovascular: Regular rate  and rhythm. No murmurs, gallops, or rubs. 2+ symmetrical distal pulses are present in all extremities. No JVD. Respiratory: Normal respiratory effort. Lungs are clear to auscultation bilaterally. No wheezes, crackles, or rhonchi.  Gastrointestinal: Soft, non tender, and non distended with positive bowel sounds. No rebound or guarding. Musculoskeletal: Nontender with normal range of motion in all extremities. No edema, cyanosis, or erythema of extremities. Neurologic: Normal speech and language. Face is symmetric. Moving all extremities. No gross focal neurologic deficits are appreciated. Skin: Skin is warm, dry and intact. No rash noted. Psychiatric: Mood and affect are normal. Speech and behavior are normal.  ____________________________________________   LABS (all labs ordered are listed, but only abnormal results are displayed)  Labs Reviewed  COMPREHENSIVE METABOLIC PANEL - Abnormal; Notable for the following:       Result Value   Glucose, Bld 178 (*)    BUN 56 (*)    Creatinine, Ser 2.40 (*)    Calcium 8.7 (*)    ALT <5 (*)    GFR calc non Af Amer 19 (*)    GFR calc Af Amer 21 (*)    All other components within normal limits  CBC - Abnormal; Notable for the following:    Hemoglobin 11.5 (*)    HCT 34.3 (*)    All other components within normal limits  URINALYSIS, COMPLETE (UACMP) WITH MICROSCOPIC - Abnormal; Notable for the following:    Color, Urine  YELLOW (*)    APPearance CLOUDY (*)    Hgb urine dipstick SMALL (*)    Protein, ur 100 (*)    Leukocytes, UA LARGE (*)    Bacteria, UA MANY (*)    Squamous Epithelial / LPF 0-5 (*)    All other components within normal limits  GLUCOSE, CAPILLARY - Abnormal; Notable for the following:    Glucose-Capillary 279 (*)    All other components within normal limits  TROPONIN I  INFLUENZA PANEL BY PCR (TYPE A & B)   ____________________________________________  EKG  ED ECG REPORT I, Rudene Re, the attending physician, personally viewed and interpreted this ECG.  Normal sinus rhythm, rate of 76, incomplete left bundle branch block, prolonged QTC, normal axis, T-wave inversions on 1 and aVL, no ST elevations. EKG is unchanged from prior.  ____________________________________________  RADIOLOGY  CXR: Negative  ____________________________________________   PROCEDURES  Procedure(s) performed: None Procedures Critical Care performed:  None ____________________________________________   INITIAL IMPRESSION / ASSESSMENT AND PLAN / ED COURSE  76 y.o. female the history of type 2 diabetes on insulin, CAD, CK-MB, hypertension, hyperlipidemia and Parkinson's disease and presents for evaluation of hypoglycemia. This episode happened in the setting of patient receiving her lunch insulin and not eating her meal. Blood glucose on arrival here is 153. Patient is hypothermic (rectally). No signs or symptoms of infectious etiology based on patient's history and my physical exam. However will check for flu, chest x-ray to rule out pneumonia, UA to eval for UTI. Patient given soda and sandwich upon arrival.   Clinical Course as of Nov 07 2043  Fri Nov 06, 2016  2004 UA positive for urinary tract infection. Although patient was hypothermic this could be seen with hypoglycemia, patient has normal white count, normal pulse therefore I don't think patient meets sepsis criteria. We'll give her a dose  of IV ceftriaxone and discharge her home on Keflex. Flu is pending.  [CV]  2043 Patient's glucometer was checked in the emergency room and is reading high compared to our glucometer (  our read 200 and home read 450 with same fingerstick). I am giving a prescription to the family for new glucometer in the morning. Recommended that they do not give her any insulin based on the readings of the old glucometer. Her flu is pending. Plan is to check her temperature in an hour once flu swab is back and if patient is still well with normal sugars to be discharged home on Keflex for UTI. Care transferred to Dr. Corky Downs.  [CV]    Clinical Course User Index [CV] Rudene Re, MD    Pertinent labs & imaging results that were available during my care of the patient were reviewed by me and considered in my medical decision making (see chart for details).    ____________________________________________   FINAL CLINICAL IMPRESSION(S) / ED DIAGNOSES  Final diagnoses:  Hypoglycemia  Acute cystitis without hematuria      NEW MEDICATIONS STARTED DURING THIS VISIT:  New Prescriptions   BLOOD GLUCOSE METER KIT AND SUPPLIES KIT    Dispense based on patient and insurance preference. Use up to four times daily as directed. (FOR ICD-9 250.00, 250.01).   CEPHALEXIN (KEFLEX) 500 MG CAPSULE    Take 1 capsule (500 mg total) by mouth 4 (four) times daily.     Note:  This document was prepared using Dragon voice recognition software and may include unintentional dictation errors.    Rudene Re, MD 11/06/16 2045

## 2016-11-06 NOTE — ED Notes (Signed)
Patient transported to X-ray 

## 2016-11-06 NOTE — ED Triage Notes (Signed)
Patient from home with c/o hypoglycemial . Fire reported CBG of 30 on scene. Raised to 62 with EMS arrival. D10 started ( infused). CBG 153 on arrival to ED.

## 2016-11-06 NOTE — ED Notes (Signed)
Dr. Cyril LoosenKinner is aware of pt's normal temp

## 2016-11-06 NOTE — ED Notes (Signed)
Patient a+o per baseline, sitting up and eating

## 2016-11-06 NOTE — ED Notes (Addendum)
Nurse tested pt's glucometer vs hospital glucometer. Pt's glucometer is reading 454 while hospital's is reading 279. Dr. Don PerkingVeronese notified.

## 2016-11-06 NOTE — ED Notes (Signed)
Pt assisted to restroom. Pt is able to ambulate with a one assist. Daughter remains at pt's bedside. Pt given yellow socks and tucked back into bed with multiple blanket. Pt in NAD at this time. Color WNL . Pt stating that she is feeling warmer.

## 2016-11-06 NOTE — Discharge Instructions (Signed)
Take antibiotics as prescribed. Do not take any insulin tonight. Resume your normal regimen tomorrow. Follow-up with your primary care doctor on Monday. Return to the emergency room if have fever, back pain, abdominal pain, nausea vomiting, or any other symptoms that are concerning to you.

## 2016-11-10 ENCOUNTER — Ambulatory Visit: Payer: Medicare Other | Admitting: Urology

## 2016-11-10 ENCOUNTER — Encounter: Payer: Self-pay | Admitting: Urology

## 2016-11-23 NOTE — Progress Notes (Signed)
11/24/2016 11:06 AM   Barry Brunner 1941-08-02 [id number]  Referring provider: Suann Larry, MD [address] [place], [place] [address]  Chief Complaint  Patient presents with  . New Patient (Initial Visit)    Recurrent uti referred by Dr. Jasper Loser    HPI: Patient is a 76 year old [place] female who is referred to [place] by, Dr. Arlyce Harman, for recurrent urinary tract infections with her daughter, [name].    Patient states that she has had several urinary tract infections over the last year.  Her symptoms with a urinary tract infection consist of suprapubic pressure, chills and lightheadedness.  She denies dysuria, gross hematuria, back pain, abdominal pain and flank pain.  She has not had any recent fevers, chills, nausea or vomiting.    She does have a history of nephrolithiasis with spontaneous passage of stones.   Reviewing her records,  she has had three documented UTI's.      She is not sexually active.  She is post menopausal.  She denies constipation and/or diarrhea.   She does engage in good perineal hygiene. She does not take tub baths.     She has incontinence.  She is using incontinence pads.  She is going through two to three depends daily.    She is not having pain with bladder filling.   She has not had any recent imaging studies.    She is drinking a big jug of water daily.   She is drinking three cups of coffee daily.  She is drinking one Pepsi.    Her PVR today was 260 mL.  She is currently on Myrbetriq 25 mg daily and taking cranberry tablets.    PMH: Past Medical History:  Diagnosis Date  . Anxiety   . Arthritis   . Chronic kidney disease   . Coronary artery disease   . Depression   . Diabetes mellitus without complication ([place])   . Hyperlipidemia   . Hypertension   . Parkinson disease ([place])   . Renal disorder     Surgical History: Past Surgical History:  Procedure Laterality Date  . BACK SURGERY    . CORONARY ANGIOPLASTY WITH STENT  PLACEMENT      Home Medications:  Allergies as of 11/24/2016   No Known Allergies     Medication List       Accurate as of 11/24/16 11:06 AM. Always use your most recent med list.          acetaminophen 325 MG tablet Commonly known as:  TYLENOL Take 650 mg by mouth every 6 (six) hours as needed for mild pain.   atorvastatin 20 MG tablet Commonly known as:  LIPITOR Take 20 mg by mouth daily.   blood glucose meter kit and supplies Kit Dispense based on patient and insurance preference. Use up to four times daily as directed. (FOR ICD-9 250.00, 250.01).   Carbidopa-Levodopa ER 25-100 MG tablet controlled release Commonly known as:  SINEMET CR Take 2 tablets by mouth 4 (four) times daily.   clopidogrel 75 MG tablet Commonly known as:  PLAVIX Take 75 mg by mouth daily.   CoQ10 50 MG Caps Take 50 mg by mouth daily.   CRANBERRY PO Take 1 tablet by mouth daily.   escitalopram 20 MG tablet Commonly known as:  LEXAPRO Take 20 mg by mouth daily.   Fish Oil 1000 MG Caps Take 1,000 mg by mouth daily.   furosemide 20 MG tablet Commonly known as:  LASIX Take 10 mg  by mouth 2 (two) times daily.   gabapentin 300 MG capsule Commonly known as:  NEURONTIN Take 300 mg by mouth at bedtime.   hydrALAZINE 25 MG tablet Commonly known as:  APRESOLINE Take 50 mg by mouth 2 (two) times daily.   insulin glargine 100 UNIT/ML injection Commonly known as:  LANTUS Inject 40 Units into the skin at bedtime.   insulin lispro 100 UNIT/ML injection Commonly known as:  HUMALOG Inject 3 Units into the skin 3 (three) times daily with meals.   isosorbide mononitrate 60 MG 24 hr tablet Commonly known as:  IMDUR Take 60 mg by mouth daily.   loratadine 10 MG tablet Commonly known as:  CLARITIN Take 5 mg by mouth daily as needed for allergies.   LORazepam 1 MG tablet Commonly known as:  ATIVAN Take 1 mg by mouth 3 (three) times daily.   MYRBETRIQ 25 MG Tb24 tablet Generic drug:   mirabegron ER Take 25 mg by mouth daily.   nitroGLYCERIN 0.4 MG SL tablet Commonly known as:  NITROSTAT Place 0.4 mg under the tongue every 5 (five) minutes as needed for chest pain.   polyethylene glycol packet Commonly known as:  MIRALAX / GLYCOLAX Take 17 g by mouth daily as needed for mild constipation.   promethazine 25 MG tablet Commonly known as:  PHENERGAN Take 25 mg by mouth every 6 (six) hours as needed for nausea or vomiting.   saccharomyces boulardii 250 MG capsule Commonly known as:  FLORASTOR Take 250 mg by mouth daily.       Allergies: No Known Allergies  Family History: Family History  Problem Relation Age of Onset  . Cervical cancer Mother   . Kidney failure Father   . Prostate cancer Neg Hx   . Kidney cancer Neg Hx   . Bladder Cancer Neg Hx     Social History:  reports that she has never smoked. She has never used smokeless tobacco. She reports that she does not drink alcohol or use drugs.  ROS: UROLOGY Frequent Urination?: Yes Hard to postpone urination?: Yes Burning/pain with urination?: No Get up at night to urinate?: Yes Leakage of urine?: Yes Urine stream starts and stops?: No Trouble starting stream?: No Do you have to strain to urinate?: No Blood in urine?: No Urinary tract infection?: No Sexually transmitted disease?: No Injury to kidneys or bladder?: No Painful intercourse?: No Weak stream?: No Currently pregnant?: No Vaginal bleeding?: Yes Last menstrual period?: n  Gastrointestinal Nausea?: No Vomiting?: No Indigestion/heartburn?: No Diarrhea?: No Constipation?: No  Constitutional Fever: No Night sweats?: No Weight loss?: No Fatigue?: No  Skin Skin rash/lesions?: No Itching?: No  Eyes Blurred vision?: No Double vision?: No  Ears/Nose/Throat Sore throat?: No Sinus problems?: No  Hematologic/Lymphatic Swollen glands?: No Easy bruising?: No  Cardiovascular Leg swelling?: No Chest pain?:  No  Respiratory Cough?: No Shortness of breath?: No  Endocrine Excessive thirst?: No  Musculoskeletal Back pain?: No Joint pain?: No  Neurological Headaches?: No Dizziness?: No  Psychologic Depression?: No Anxiety?: No  Physical Exam: BP (!) 168/100   Pulse 73   Ht 5' 7"  (1.702 m)   Constitutional: Well nourished. Alert and oriented, No acute distress. HEENT:  AT, moist mucus membranes. Trachea midline, no masses. Cardiovascular: No clubbing, cyanosis, or edema. Respiratory: Normal respiratory effort, no increased work of breathing. GI: Abdomen is soft, non tender, non distended, no abdominal masses. Liver and spleen not palpable.  No hernias appreciated.  Stool sample for occult testing is  not indicated.   GU: No CVA tenderness.  No bladder fullness or masses.  Atrophic external genitalia, normal pubic hair distribution, no lesions.  Normal urethral meatus, no lesions, no prolapse, no discharge.   Urethral caruncle.  No bladder fullness, tenderness or masses. Pale vagina mucosa, poor estrogen effect, no discharge, no lesions, good pelvic support, Grade II cystocele is noted.  No rectocele noted.  No cervical motion tenderness.  Uterus is freely mobile and non-fixed.  No adnexal/parametria masses or tenderness noted.  Anus and perineum are without rashes or lesions.    Skin: No rashes, bruises or suspicious lesions. Lymph: No cervical or inguinal adenopathy. Neurologic: Grossly intact, no focal deficits, moving all 4 extremities. Psychiatric: Normal mood and affect.  Laboratory Data: Lab Results  Component Value Date   WBC 6.8 11/06/2016   HGB 11.5 (L) 11/06/2016   HCT 34.3 (L) 11/06/2016   MCV 89.9 11/06/2016   PLT 206 11/06/2016    Lab Results  Component Value Date   CREATININE 2.40 (H) 11/06/2016     Lab Results  Component Value Date   AST 18 11/06/2016   Lab Results  Component Value Date   ALT <5 (L) 11/06/2016      Pertinent Imaging: Results  for DYANN, [place] (MRN [id number]) as of 11/24/2016 10:51  Ref. Range 11/24/2016 10:33  Scan Result Unknown 260    Assessment & Plan:    1. Recurrent UTI  - Patient is instructed to increase her water intake until the urine is pale yellow or clear.  I have advised her to take probiotics (yogurt, oral pills or vaginal suppositories), take cranberry pills or drink the juice and use the estrogen cream.  She is to take Vitamin C 1,000 mg daily to acidify the urine.  She should also avoid soaking in tubs and wipe front to back after urinating.  She may benefit from core strengthening exercises.  We can refer her to PT if she desires.    - Because of her history of recurrent UTIs, I have asked the patient to contact our office if she should experience symptoms of urinary tract infection so that we can CATH her for an urine specimen for urinalysis and culture. This is to prevent a skin contaminant from showing up in the urine culture.  If she should have her symptoms after hours or cannot get to our office, she should notify her other providers that she needs a catheterized specimen for UA and culture.   - I reviewed the symptoms of a urinary tract infection, such as a worsening of urinary urgency and frequency, dysuria, which is painful urination and not the pain of urine hitting sensitive perineal skin, hematuria, foul-smelling urine, suprapubic pain or mental status changes. Fevers, chills, nausea and or vomiting can also be signs of a possible UTI.  Positive urinalyses and positive urine cultures that are not associated with urinary symptoms should not be treated with antibiotics.    - I explained to the patient that being exposed to unnecessary antibiotics can put her at risk for increasing resistance of the bacteria to antibiotics, C. difficile and the side effects of the antibiotics.     2. Vaginal atrophy  - I explained to the patient that when women go through menopause and her estrogen levels are  severely diminished, the normal vaginal flora will change.  This is due to an increase of the vaginal canal's pH. Because of this, the vaginal canal may be colonized by bacteria from the  rectum instead of the protective lactobacillus.  This accompanied by the loss of the mucus barrier with vaginal atrophy is a cause of recurrent urinary tract infections.  - In some studies, the use of vaginal estrogen cream has been demonstrated to reduce  recurrent urinary tract infections to one a year.   - A new medication, prasterone ([place]) is a vaginal suppository containing DHEA also changes the vaginal pH.  There have not been studies on its effectiveness in reducing UTI's, but it does change the vaginal pH.  This could be an alternative for women who are hesitant in using estrogen containing products.  I have given her samples until estrogen cream samples are available.    - RTC in 3 months for exam  3. History of nephrolithiasis  - obtain a Renal stone study to rule out stones as a nidus for infection                  Return for RTC for CT report.  These notes generated with voice recognition software. I apologize for typographical errors.  Zara Council, [facility] [address], [place], [place] [address] [phone]

## 2016-11-24 ENCOUNTER — Ambulatory Visit: Payer: Medicare Other | Admitting: Urology

## 2016-11-24 ENCOUNTER — Encounter: Payer: Self-pay | Admitting: Urology

## 2016-11-24 VITALS — BP 168/100 | HR 73 | Ht 67.0 in

## 2016-11-24 DIAGNOSIS — Z87442 Personal history of urinary calculi: Secondary | ICD-10-CM | POA: Diagnosis not present

## 2016-11-24 DIAGNOSIS — N390 Urinary tract infection, site not specified: Secondary | ICD-10-CM | POA: Diagnosis not present

## 2016-11-24 DIAGNOSIS — N952 Postmenopausal atrophic vaginitis: Secondary | ICD-10-CM

## 2016-11-24 LAB — BLADDER SCAN AMB NON-IMAGING: Scan Result: 260

## 2016-12-07 ENCOUNTER — Ambulatory Visit
Admission: RE | Admit: 2016-12-07 | Discharge: 2016-12-07 | Disposition: A | Discharge status: Home / Self Care | Payer: Medicare Other | Source: Ambulatory Visit | Attending: Urology | Admitting: Urology

## 2016-12-07 DIAGNOSIS — I700 Atherosclerosis of aorta: Secondary | ICD-10-CM | POA: Insufficient documentation

## 2016-12-07 DIAGNOSIS — I2510 Atherosclerotic heart disease of native coronary artery without angina pectoris: Secondary | ICD-10-CM | POA: Insufficient documentation

## 2016-12-07 DIAGNOSIS — N329 Bladder disorder, unspecified: Secondary | ICD-10-CM | POA: Insufficient documentation

## 2016-12-07 DIAGNOSIS — Z87442 Personal history of urinary calculi: Secondary | ICD-10-CM

## 2016-12-07 DIAGNOSIS — I517 Cardiomegaly: Secondary | ICD-10-CM | POA: Insufficient documentation

## 2016-12-07 DIAGNOSIS — N390 Urinary tract infection, site not specified: Secondary | ICD-10-CM

## 2016-12-07 DIAGNOSIS — N200 Calculus of kidney: Secondary | ICD-10-CM | POA: Diagnosis not present

## 2016-12-07 DIAGNOSIS — N281 Cyst of kidney, acquired: Secondary | ICD-10-CM | POA: Insufficient documentation

## 2016-12-07 DIAGNOSIS — I313 Pericardial effusion (noninflammatory): Secondary | ICD-10-CM | POA: Insufficient documentation

## 2016-12-08 ENCOUNTER — Telehealth: Payer: Self-pay

## 2016-12-08 ENCOUNTER — Encounter: Payer: Self-pay | Admitting: Urology

## 2016-12-08 ENCOUNTER — Ambulatory Visit: Payer: Medicare Other | Admitting: Urology

## 2016-12-08 VITALS — BP 160/91 | HR 80 | Ht 66.0 in | Wt 164.7 lb

## 2016-12-08 DIAGNOSIS — N952 Postmenopausal atrophic vaginitis: Secondary | ICD-10-CM

## 2016-12-08 DIAGNOSIS — N390 Urinary tract infection, site not specified: Secondary | ICD-10-CM

## 2016-12-08 DIAGNOSIS — Z87442 Personal history of urinary calculi: Secondary | ICD-10-CM

## 2016-12-08 LAB — URINALYSIS, COMPLETE
Bilirubin, UA: NEGATIVE
Nitrite, UA: POSITIVE — AB
Specific Gravity, UA: 1.025 (ref 1.005–1.030)
Urobilinogen, Ur: 0.2 mg/dL (ref 0.2–1.0)
pH, UA: 5 (ref 5.0–7.5)

## 2016-12-08 LAB — MICROSCOPIC EXAMINATION
Bacteria, UA: NONE SEEN
EPITHELIAL CELLS (NON RENAL): NONE SEEN /HPF (ref 0–10)
RBC MICROSCOPIC, UA: NONE SEEN /HPF (ref 0–?)
WBC, UA: >30 /hpf — AB (ref 0–?)

## 2016-12-08 MED ORDER — CEPHALEXIN 250 MG PO CAPS: 250.0000 mg | ORAL_CAPSULE | Freq: Three times a day (TID) | ORAL | 0 refills | Status: DC

## 2016-12-08 NOTE — Telephone Encounter (Signed)
LMOM

## 2016-12-08 NOTE — Telephone Encounter (Signed)
Spoke with pt daughter in reference to needing to come in today. Daughter voiced understanding. Pt was added to Shannon's schedule today at 2:15.

## 2016-12-08 NOTE — Telephone Encounter (Signed)
-----   Message from Harle BattiestShannon A McGowan, PA-C sent at 12/07/2016  8:59 PM EST ----- Patient has air in her bladder and will need to come in today to have a CATH UA and urine sent for culture on my schedule.

## 2016-12-08 NOTE — Progress Notes (Signed)
12/08/2016 3:06 PM   Molly Wolf 04/10/1941 [id number]  Referring provider: Suann Larry, MD [address] [place], [place] [address]  Chief Complaint  Patient presents with  . Results    CT    HPI: 76 yo WF who presents today to discuss her CT Renal stone study results performed to evaluate for a possible nidus for recurrent UTI's.  Background history Patient is a 76 year old Caucasian female who is referred to [place] by, Dr. Arlyce Wolf, for recurrent urinary tract infections with her daughter, Molly Wolf.  Patient states that she has had several urinary tract infections over the last year.  Her symptoms with a urinary tract infection consist of suprapubic pressure, chills and lightheadedness.  She denies dysuria, gross hematuria, back pain, abdominal pain and flank pain.  She has not had any recent fevers, chills, nausea or vomiting.  She does have a history of nephrolithiasis with spontaneous passage of stones.   Reviewing her records,  she has had three documented UTI's.   She is not sexually active.  She is post menopausal.  She denies constipation and/or diarrhea.   She does engage in good perineal hygiene. She does not take tub baths.  She has incontinence.  She is using incontinence pads.  She is going through two to three depends daily.   She is not having pain with bladder filling.   She has not had any recent imaging studies.   She is drinking a big jug of water daily.   She is drinking three cups of coffee daily.  She is drinking one Pepsi.  Her PVR today was 260 mL.  She is currently on Myrbetriq 25 mg daily and taking cranberry tablets.    CT Renal stone study noted no renal or ureteral obstructing stone or hydronephrosis.  Tiny nonobstructing right renal calculi. 4 mm right upper pole low-density structure possibly a cyst but too small characterize.  Scarring of the left kidney may be related to prior obstruction.  Tiny upper pole nonobstructing renal calculus. Upper pole 1.2 cm  cyst and left lower pole 1.7 cm cyst.  Partially decompressed urinary bladder with circumferential wall thickening with slightly indistinct borders. Although wall thickening may be related to partial decompression result of cystitis or mucosal abnormality not excluded. Additionally, there is gas within the nondependent aspect which may be related to recent manipulation. If the patient did not have recent catheterization than fistula would need to be  considered although no discrete fistula is identified.  Cardiomegaly. Small pericardial effusion. Prominent coronary artery calcification.  Aortic atherosclerosis.  I have independently reviewed the films.     Today, she complains of frequency, urgency and incontinence.  She denies dysuria, gross hematuria, pneumaturia and passage of feces in the urine.  She has not had fevers, chills, nausea and vomiting.  She does not have a history of inflammatory bowel disease, diverticulitis or abdominal surgeries.  She was cathed for an UA today and it noted > 30 WBC's and it was nitrite positive.   PMH: Past Medical History:  Diagnosis Date  . Anxiety   . Arthritis   . Chronic kidney disease   . Coronary artery disease   . Depression   . Diabetes mellitus without complication ([place])   . Hyperlipidemia   . Hypertension   . Parkinson disease ([place])   . Renal disorder     Surgical History: Past Surgical History:  Procedure Laterality Date  . BACK SURGERY    . CORONARY ANGIOPLASTY WITH STENT  PLACEMENT      Home Medications:  Allergies as of 12/08/2016   No Known Allergies     Medication List       Accurate as of 12/08/16  3:06 PM. Always use your most recent med list.          acetaminophen 325 MG tablet Commonly known as:  TYLENOL Take 650 mg by mouth every 6 (six) hours as needed for mild pain.   atorvastatin 20 MG tablet Commonly known as:  LIPITOR Take 20 mg by mouth daily.   blood glucose meter kit and supplies Kit Dispense based on  patient and insurance preference. Use up to four times daily as directed. (FOR ICD-9 250.00, 250.01).   Carbidopa-Levodopa ER 25-100 MG tablet controlled release Commonly known as:  SINEMET CR Take 2 tablets by mouth 4 (four) times daily.   cephALEXin 250 MG capsule Commonly known as:  KEFLEX Take 1 capsule (250 mg total) by mouth 3 (three) times daily.   clopidogrel 75 MG tablet Commonly known as:  PLAVIX Take 75 mg by mouth daily.   CoQ10 50 MG Caps Take 50 mg by mouth daily.   CRANBERRY PO Take 1 tablet by mouth daily.   escitalopram 20 MG tablet Commonly known as:  LEXAPRO Take 20 mg by mouth daily.   Fish Oil 1000 MG Caps Take 1,000 mg by mouth daily.   furosemide 20 MG tablet Commonly known as:  LASIX Take 10 mg by mouth 2 (two) times daily.   gabapentin 300 MG capsule Commonly known as:  NEURONTIN Take 300 mg by mouth at bedtime.   hydrALAZINE 25 MG tablet Commonly known as:  APRESOLINE Take 50 mg by mouth 2 (two) times daily.   insulin glargine 100 UNIT/ML injection Commonly known as:  LANTUS Inject 40 Units into the skin at bedtime.   insulin lispro 100 UNIT/ML injection Commonly known as:  HUMALOG Inject 3 Units into the skin 3 (three) times daily with meals.   isosorbide mononitrate 60 MG 24 hr tablet Commonly known as:  IMDUR Take 60 mg by mouth daily.   loratadine 10 MG tablet Commonly known as:  CLARITIN Take 5 mg by mouth daily as needed for allergies.   LORazepam 1 MG tablet Commonly known as:  ATIVAN Take 1 mg by mouth 3 (three) times daily.   MYRBETRIQ 25 MG Tb24 tablet Generic drug:  mirabegron ER Take 25 mg by mouth daily.   nitroGLYCERIN 0.4 MG SL tablet Commonly known as:  NITROSTAT Place 0.4 mg under the tongue every 5 (five) minutes as needed for chest pain.   polyethylene glycol packet Commonly known as:  MIRALAX / GLYCOLAX Take 17 g by mouth daily as needed for mild constipation.   promethazine 25 MG tablet Commonly  known as:  PHENERGAN Take 25 mg by mouth every 6 (six) hours as needed for nausea or vomiting.   saccharomyces boulardii 250 MG capsule Commonly known as:  FLORASTOR Take 250 mg by mouth daily.       Allergies: No Known Allergies  Family History: Family History  Problem Relation Age of Onset  . Cervical cancer Mother   . Kidney failure Father   . Prostate cancer Neg Hx   . Kidney cancer Neg Hx   . Bladder Cancer Neg Hx     Social History:  reports that she has never smoked. She has never used smokeless tobacco. She reports that she does not drink alcohol or use drugs.  ROS: UROLOGY  Frequent Urination?: Yes Hard to postpone urination?: Yes Burning/pain with urination?: No Get up at night to urinate?: No Leakage of urine?: Yes Urine stream starts and stops?: No Trouble starting stream?: No Do you have to strain to urinate?: No Blood in urine?: No Urinary tract infection?: No Sexually transmitted disease?: No Injury to kidneys or bladder?: No Painful intercourse?: No Weak stream?: No Currently pregnant?: No Vaginal bleeding?: No Last menstrual period?: n  Gastrointestinal Nausea?: No Vomiting?: No Indigestion/heartburn?: No Diarrhea?: No Constipation?: No  Constitutional Fever: No Night sweats?: No Weight loss?: No Fatigue?: No  Skin Skin rash/lesions?: No Itching?: No  Eyes Blurred vision?: No Double vision?: No  Ears/Nose/Throat Sore throat?: No Sinus problems?: No  Hematologic/Lymphatic Swollen glands?: No Easy bruising?: No  Cardiovascular Leg swelling?: No Chest pain?: No  Respiratory Cough?: No Shortness of breath?: No  Endocrine Excessive thirst?: No  Musculoskeletal Back pain?: No Joint pain?: No  Neurological Headaches?: No Dizziness?: No  Psychologic Depression?: No Anxiety?: No  Physical Exam: BP (!) 160/91   Pulse 80   Ht 5' 6"  (1.676 m)   Wt 164 lb 11.2 oz (74.7 kg)   BMI 26.58 kg/m   Constitutional:  Well nourished. Alert and oriented, No acute distress. HEENT: [place] AT, moist mucus membranes. Trachea midline, no masses. Cardiovascular: No clubbing, cyanosis, or edema. Respiratory: Normal respiratory effort, no increased work of breathing. Skin: No rashes, bruises or suspicious lesions. Lymph: No cervical or inguinal adenopathy. Neurologic: Grossly intact, no focal deficits, moving all 4 extremities. Psychiatric: Normal mood and affect.  Laboratory Data: Lab Results  Component Value Date   WBC 6.8 11/06/2016   HGB 11.5 (L) 11/06/2016   HCT 34.3 (L) 11/06/2016   MCV 89.9 11/06/2016   PLT 206 11/06/2016    Lab Results  Component Value Date   CREATININE 2.40 (H) 11/06/2016     Lab Results  Component Value Date   AST 18 11/06/2016   Lab Results  Component Value Date   ALT <5 (L) 11/06/2016    Pertinent Imaging: Results for INDYA, OLIVERIA (MRN [id number]) as of 11/24/2016 10:51  Ref. Range 11/24/2016 10:33  Scan Result Unknown 260    Assessment & Plan:    1. Recurrent UTI  - question of a possible fistula due to air in bladder on CT  - start Keflex 250 mg tid for ten days  - UA suspicious for infection will send for culture - adjust when sensitives are available  - then place on suppressive therapy for 6 weeks and then cystoscopy to evaluate for fistula (discussed with Dr. Louis Meckel)  - Advised to contact our office or seek treatment in the ED if becomes febrile or pain/ vomiting are difficult control in order to arrange for emergent/urgent intervention  2. Vaginal atrophy  - continue Intrarosa suppositories  - RTC in 3 months for exam  3. History of nephrolithiasis  - no stones seen on CT Renal stone study      Return in about 6 weeks (around 01/19/2017) for cystoscopy.  These notes generated with voice recognition software. I apologize for typographical errors.  Zara Council, [facility] [address], [place],  [address] [phone]

## 2016-12-08 NOTE — Progress Notes (Signed)
In and Out Catheterization  Patient is present today for a I & O catheterization due to needing a clean catch urine. Patient was cleaned and prepped in a sterile fashion with betadine and Lidocaine 2% jelly was instilled into the urethra.  A 14FR cath was inserted no complications were noted , 50ml of urine return was noted, urine was turbid and yellow in color. A clean urine sample was collected for urinalysis and culture. Bladder was drained and catheter was removed with out difficulty.    Preformed by: Dallas Schimkeamona Umer Harig CMA

## 2016-12-10 ENCOUNTER — Ambulatory Visit: Payer: Medicare Other | Admitting: Urology

## 2016-12-10 LAB — CULTURE, URINE COMPREHENSIVE

## 2016-12-11 ENCOUNTER — Telehealth: Payer: Self-pay

## 2016-12-11 NOTE — Telephone Encounter (Signed)
LMOM

## 2016-12-11 NOTE — Telephone Encounter (Signed)
-----   Message from Harle BattiestShannon A McGowan, PA-C sent at 12/11/2016  8:11 AM EST ----- Please notify the patient that her urine culture was positive and to continue the Keflex.  I would like to have her have a CATH UA in two weeks for UA and culture.

## 2016-12-14 NOTE — Telephone Encounter (Signed)
Spoke with pt and daughter in reference to needing a cath specimen after abx are completed. Daughter stated that pt has a cysto on the 3/19 and would prefer to just have everything done at that appt. Reinforced with daughter that should be ok, as cysto will not be completed if infection present. Daughter voiced understanding.

## 2016-12-27 ENCOUNTER — Emergency Department: Payer: Medicare Other

## 2016-12-27 ENCOUNTER — Inpatient Hospital Stay
Admission: EM | Admit: 2016-12-27 | Discharge: 2016-12-29 | DRG: 563 | Disposition: A | Discharge status: Skilled Nursing Facility | Payer: Medicare Other | Attending: Internal Medicine | Admitting: Internal Medicine

## 2016-12-27 DIAGNOSIS — I129 Hypertensive chronic kidney disease with stage 1 through stage 4 chronic kidney disease, or unspecified chronic kidney disease: Secondary | ICD-10-CM | POA: Diagnosis present

## 2016-12-27 DIAGNOSIS — S82031A Displaced transverse fracture of right patella, initial encounter for closed fracture: Principal | ICD-10-CM | POA: Diagnosis present

## 2016-12-27 DIAGNOSIS — W010XXA Fall on same level from slipping, tripping and stumbling without subsequent striking against object, initial encounter: Secondary | ICD-10-CM | POA: Diagnosis present

## 2016-12-27 DIAGNOSIS — N289 Disorder of kidney and ureter, unspecified: Secondary | ICD-10-CM | POA: Diagnosis present

## 2016-12-27 DIAGNOSIS — Z79899 Other long term (current) drug therapy: Secondary | ICD-10-CM

## 2016-12-27 DIAGNOSIS — M25461 Effusion, right knee: Secondary | ICD-10-CM | POA: Diagnosis present

## 2016-12-27 DIAGNOSIS — Z794 Long term (current) use of insulin: Secondary | ICD-10-CM

## 2016-12-27 DIAGNOSIS — F419 Anxiety disorder, unspecified: Secondary | ICD-10-CM | POA: Diagnosis present

## 2016-12-27 DIAGNOSIS — D638 Anemia in other chronic diseases classified elsewhere: Secondary | ICD-10-CM | POA: Diagnosis present

## 2016-12-27 DIAGNOSIS — R32 Unspecified urinary incontinence: Secondary | ICD-10-CM | POA: Diagnosis present

## 2016-12-27 DIAGNOSIS — S0083XA Contusion of other part of head, initial encounter: Secondary | ICD-10-CM | POA: Diagnosis present

## 2016-12-27 DIAGNOSIS — E785 Hyperlipidemia, unspecified: Secondary | ICD-10-CM | POA: Diagnosis present

## 2016-12-27 DIAGNOSIS — R52 Pain, unspecified: Secondary | ICD-10-CM | POA: Diagnosis present

## 2016-12-27 DIAGNOSIS — S0081XA Abrasion of other part of head, initial encounter: Secondary | ICD-10-CM | POA: Diagnosis present

## 2016-12-27 DIAGNOSIS — Z841 Family history of disorders of kidney and ureter: Secondary | ICD-10-CM | POA: Diagnosis not present

## 2016-12-27 DIAGNOSIS — Y92009 Unspecified place in unspecified non-institutional (private) residence as the place of occurrence of the external cause: Secondary | ICD-10-CM | POA: Diagnosis not present

## 2016-12-27 DIAGNOSIS — N183 Chronic kidney disease, stage 3 (moderate): Secondary | ICD-10-CM | POA: Diagnosis present

## 2016-12-27 DIAGNOSIS — E1122 Type 2 diabetes mellitus with diabetic chronic kidney disease: Secondary | ICD-10-CM | POA: Diagnosis present

## 2016-12-27 DIAGNOSIS — Z66 Do not resuscitate: Secondary | ICD-10-CM | POA: Diagnosis present

## 2016-12-27 DIAGNOSIS — F329 Major depressive disorder, single episode, unspecified: Secondary | ICD-10-CM | POA: Diagnosis present

## 2016-12-27 DIAGNOSIS — N189 Chronic kidney disease, unspecified: Secondary | ICD-10-CM

## 2016-12-27 DIAGNOSIS — Z23 Encounter for immunization: Secondary | ICD-10-CM

## 2016-12-27 DIAGNOSIS — I2510 Atherosclerotic heart disease of native coronary artery without angina pectoris: Secondary | ICD-10-CM | POA: Diagnosis present

## 2016-12-27 DIAGNOSIS — Z955 Presence of coronary angioplasty implant and graft: Secondary | ICD-10-CM | POA: Diagnosis not present

## 2016-12-27 DIAGNOSIS — G20 Parkinson's disease: Secondary | ICD-10-CM | POA: Diagnosis present

## 2016-12-27 DIAGNOSIS — S82034A Nondisplaced transverse fracture of right patella, initial encounter for closed fracture: Secondary | ICD-10-CM

## 2016-12-27 DIAGNOSIS — Z7902 Long term (current) use of antithrombotics/antiplatelets: Secondary | ICD-10-CM

## 2016-12-27 DIAGNOSIS — M25561 Pain in right knee: Secondary | ICD-10-CM

## 2016-12-27 LAB — CBC WITH DIFFERENTIAL/PLATELET
BASOS ABS: 0 10*3/uL (ref 0–0.1)
BASOS PCT: 1 %
Eosinophils Absolute: 0.1 10*3/uL (ref 0–0.7)
Eosinophils Relative: 2 %
HEMATOCRIT: 30.8 % — AB (ref 35.0–47.0)
HEMOGLOBIN: 10.9 g/dL — AB (ref 12.0–16.0)
LYMPHS PCT: 12 %
Lymphs Abs: 0.8 10*3/uL — ABNORMAL LOW (ref 1.0–3.6)
MCH: 30.9 pg (ref 26.0–34.0)
MCHC: 35.3 g/dL (ref 32.0–36.0)
MCV: 87.4 fL (ref 80.0–100.0)
MONOS PCT: 7 %
Monocytes Absolute: 0.5 10*3/uL (ref 0.2–0.9)
NEUTROS ABS: 5.4 10*3/uL (ref 1.4–6.5)
NEUTROS PCT: 78 %
Platelets: 215 10*3/uL (ref 150–440)
RBC: 3.52 MIL/uL — ABNORMAL LOW (ref 3.80–5.20)
RDW: 12.9 % (ref 11.5–14.5)
WBC: 6.8 10*3/uL (ref 3.6–11.0)

## 2016-12-27 LAB — GLUCOSE, CAPILLARY: GLUCOSE-CAPILLARY: 261 mg/dL — AB (ref 65–99)

## 2016-12-27 LAB — COMPREHENSIVE METABOLIC PANEL
ALK PHOS: 64 U/L (ref 38–126)
ALT: 5 U/L — ABNORMAL LOW (ref 14–54)
AST: 14 U/L — AB (ref 15–41)
Albumin: 3.6 g/dL (ref 3.5–5.0)
Anion gap: 5 (ref 5–15)
BILIRUBIN TOTAL: 1 mg/dL (ref 0.3–1.2)
BUN: 43 mg/dL — ABNORMAL HIGH (ref 6–20)
CHLORIDE: 108 mmol/L (ref 101–111)
CO2: 23 mmol/L (ref 22–32)
CREATININE: 2.5 mg/dL — AB (ref 0.44–1.00)
Calcium: 8.8 mg/dL — ABNORMAL LOW (ref 8.9–10.3)
GFR calc Af Amer: 20 mL/min — ABNORMAL LOW (ref >60)
GFR, EST NON AFRICAN AMERICAN: 17 mL/min — AB (ref >60)
GLUCOSE: 160 mg/dL — AB (ref 65–99)
Potassium: 4.5 mmol/L (ref 3.5–5.1)
Sodium: 136 mmol/L (ref 135–145)
Total Protein: 6.9 g/dL (ref 6.5–8.1)

## 2016-12-27 LAB — PROTIME-INR
INR: 1.02
Prothrombin Time: 13.4 seconds (ref 11.4–15.2)

## 2016-12-27 MED ORDER — INSULIN ASPART 100 UNIT/ML ~~LOC~~ SOLN
0.0000 [IU] | Freq: Three times a day (TID) | SUBCUTANEOUS | Status: DC
Start: 2016-12-28 — End: 2016-12-29
  Administered 2016-12-29: 3 [IU] via SUBCUTANEOUS
  Filled 2016-12-27: qty 3

## 2016-12-27 MED ORDER — CLOPIDOGREL BISULFATE 75 MG PO TABS
75.0000 mg | ORAL_TABLET | Freq: Every day | ORAL | Status: DC
  Administered 2016-12-28 – 2016-12-29 (×2): 75 mg via ORAL
  Filled 2016-12-27 (×2): qty 1

## 2016-12-27 MED ORDER — IPRATROPIUM BROMIDE 0.02 % IN SOLN: 0.5000 mg | Freq: Four times a day (QID) | RESPIRATORY_TRACT | Status: DC | PRN

## 2016-12-27 MED ORDER — FUROSEMIDE 20 MG PO TABS
20.0000 mg | ORAL_TABLET | Freq: Every day | ORAL | Status: DC
  Administered 2016-12-28 – 2016-12-29 (×2): 20 mg via ORAL
  Filled 2016-12-27 (×2): qty 1

## 2016-12-27 MED ORDER — INFLUENZA VAC SPLIT QUAD 0.5 ML IM SUSY
0.5000 mL | PREFILLED_SYRINGE | INTRAMUSCULAR | Status: AC
  Administered 2016-12-29: 0.5 mL via INTRAMUSCULAR
  Filled 2016-12-27: qty 0.5

## 2016-12-27 MED ORDER — MORPHINE SULFATE (PF) 2 MG/ML IV SOLN
2.0000 mg | INTRAVENOUS | Status: DC | PRN
  Administered 2016-12-27: 2 mg via INTRAVENOUS
  Filled 2016-12-27: qty 1

## 2016-12-27 MED ORDER — INSULIN ASPART 100 UNIT/ML ~~LOC~~ SOLN
0.0000 [IU] | Freq: Every day | SUBCUTANEOUS | Status: DC
  Administered 2016-12-28 (×2): 3 [IU] via SUBCUTANEOUS
  Filled 2016-12-27 (×2): qty 3

## 2016-12-27 MED ORDER — MIRABEGRON ER 25 MG PO TB24
25.0000 mg | ORAL_TABLET | Freq: Every day | ORAL | Status: DC
  Administered 2016-12-28 – 2016-12-29 (×2): 25 mg via ORAL
  Filled 2016-12-27 (×2): qty 1

## 2016-12-27 MED ORDER — HYDRALAZINE HCL 50 MG PO TABS
50.0000 mg | ORAL_TABLET | Freq: Two times a day (BID) | ORAL | Status: DC
  Administered 2016-12-27: 50 mg via ORAL

## 2016-12-27 MED ORDER — ACETAMINOPHEN 325 MG PO TABS: 650.0000 mg | ORAL_TABLET | Freq: Four times a day (QID) | ORAL | Status: DC | PRN

## 2016-12-27 MED ORDER — MORPHINE SULFATE (PF) 2 MG/ML IV SOLN
2.0000 mg | Freq: Once | INTRAVENOUS | Status: AC
  Administered 2016-12-27: 2 mg via INTRAVENOUS
  Filled 2016-12-27: qty 1

## 2016-12-27 MED ORDER — ALBUTEROL SULFATE (2.5 MG/3ML) 0.083% IN NEBU: 2.5000 mg | INHALATION_SOLUTION | Freq: Four times a day (QID) | RESPIRATORY_TRACT | Status: DC | PRN

## 2016-12-27 MED ORDER — ATORVASTATIN CALCIUM 20 MG PO TABS
20.0000 mg | ORAL_TABLET | Freq: Every day | ORAL | Status: DC
  Administered 2016-12-28 – 2016-12-29 (×2): 20 mg via ORAL
  Filled 2016-12-27 (×2): qty 1

## 2016-12-27 MED ORDER — LORAZEPAM 1 MG PO TABS
1.0000 mg | ORAL_TABLET | Freq: Two times a day (BID) | ORAL | Status: DC
  Administered 2016-12-28 – 2016-12-29 (×4): 1 mg via ORAL
  Filled 2016-12-27 (×4): qty 1

## 2016-12-27 MED ORDER — ONDANSETRON HCL 4 MG/2ML IJ SOLN: 4.0000 mg | Freq: Four times a day (QID) | INTRAMUSCULAR | Status: DC | PRN

## 2016-12-27 MED ORDER — ONDANSETRON HCL 4 MG/2ML IJ SOLN
4.0000 mg | INTRAMUSCULAR | Status: AC
  Administered 2016-12-27: 4 mg via INTRAVENOUS
  Filled 2016-12-27: qty 2

## 2016-12-27 MED ORDER — BISACODYL 5 MG PO TBEC: 5.0000 mg | DELAYED_RELEASE_TABLET | Freq: Every day | ORAL | Status: DC | PRN

## 2016-12-27 MED ORDER — ISOSORBIDE MONONITRATE ER 30 MG PO TB24
60.0000 mg | ORAL_TABLET | Freq: Every day | ORAL | Status: DC
  Administered 2016-12-28 – 2016-12-29 (×2): 60 mg via ORAL
  Filled 2016-12-27 (×2): qty 2

## 2016-12-27 MED ORDER — CARBIDOPA-LEVODOPA ER 25-100 MG PO TBCR
2.0000 | EXTENDED_RELEASE_TABLET | Freq: Four times a day (QID) | ORAL | Status: DC
  Administered 2016-12-28 – 2016-12-29 (×4): 2 via ORAL
  Filled 2016-12-27 (×3): qty 2
  Filled 2016-12-27: qty 10
  Filled 2016-12-27 (×3): qty 2

## 2016-12-27 MED ORDER — ONDANSETRON HCL 4 MG PO TABS: 4.0000 mg | ORAL_TABLET | Freq: Four times a day (QID) | ORAL | Status: DC | PRN

## 2016-12-27 MED ORDER — ACETAMINOPHEN 650 MG RE SUPP: 650.0000 mg | Freq: Four times a day (QID) | RECTAL | Status: DC | PRN

## 2016-12-27 MED ORDER — METOPROLOL TARTRATE 25 MG PO TABS
12.5000 mg | ORAL_TABLET | Freq: Two times a day (BID) | ORAL | Status: DC
  Administered 2016-12-27 – 2016-12-29 (×4): 12.5 mg via ORAL
  Filled 2016-12-27: qty 1
  Filled 2016-12-27: qty 0.5
  Filled 2016-12-27: qty 1

## 2016-12-27 MED ORDER — LORATADINE 10 MG PO TABS: 5.0000 mg | ORAL_TABLET | Freq: Every day | ORAL | Status: DC | PRN

## 2016-12-27 MED ORDER — PROMETHAZINE HCL 25 MG PO TABS: 25.0000 mg | ORAL_TABLET | Freq: Four times a day (QID) | ORAL | Status: DC | PRN

## 2016-12-27 MED ORDER — OXYCODONE HCL 5 MG PO TABS
5.0000 mg | ORAL_TABLET | ORAL | Status: DC | PRN
  Administered 2016-12-28 – 2016-12-29 (×3): 5 mg via ORAL
  Filled 2016-12-27 (×3): qty 1

## 2016-12-27 MED ORDER — SENNOSIDES-DOCUSATE SODIUM 8.6-50 MG PO TABS: 1.0000 | ORAL_TABLET | Freq: Every evening | ORAL | Status: DC | PRN

## 2016-12-27 MED ORDER — HYDRALAZINE HCL 50 MG PO TABS
ORAL_TABLET | ORAL | Status: AC
  Filled 2016-12-27: qty 1

## 2016-12-27 MED ORDER — MORPHINE SULFATE (PF) 2 MG/ML IV SOLN
2.0000 mg | Freq: Once | INTRAVENOUS | Status: AC
Start: 2016-12-27 — End: 2016-12-27
  Administered 2016-12-27: 2 mg via INTRAVENOUS
  Filled 2016-12-27: qty 1

## 2016-12-27 MED ORDER — ESCITALOPRAM OXALATE 10 MG PO TABS
20.0000 mg | ORAL_TABLET | Freq: Every day | ORAL | Status: DC
  Administered 2016-12-28 – 2016-12-29 (×2): 20 mg via ORAL
  Filled 2016-12-27 (×2): qty 2

## 2016-12-27 MED ORDER — HEPARIN SODIUM (PORCINE) 5000 UNIT/ML IJ SOLN: 5000.0000 [IU] | Freq: Three times a day (TID) | INTRAMUSCULAR | Status: DC

## 2016-12-27 MED ORDER — SACCHAROMYCES BOULARDII 250 MG PO CAPS
250.0000 mg | ORAL_CAPSULE | Freq: Every day | ORAL | Status: DC
  Administered 2016-12-28 – 2016-12-29 (×2): 250 mg via ORAL
  Filled 2016-12-27 (×2): qty 1

## 2016-12-27 MED ORDER — METOPROLOL TARTRATE 25 MG PO TABS
ORAL_TABLET | ORAL | Status: AC
Start: 2016-12-27 — End: 2016-12-28
  Filled 2016-12-27: qty 1

## 2016-12-27 MED ORDER — NITROGLYCERIN 0.4 MG SL SUBL
0.4000 mg | SUBLINGUAL_TABLET | SUBLINGUAL | Status: DC | PRN
Start: 2016-12-27 — End: 2016-12-29

## 2016-12-27 MED ORDER — MAGNESIUM CITRATE PO SOLN
1.0000 | Freq: Once | ORAL | Status: DC | PRN
  Filled 2016-12-27: qty 296

## 2016-12-27 MED ORDER — MORPHINE SULFATE (PF) 2 MG/ML IV SOLN
1.0000 mg | INTRAVENOUS | Status: DC | PRN
  Administered 2016-12-29: 1 mg via INTRAVENOUS
  Filled 2016-12-27: qty 1

## 2016-12-27 MED ORDER — INSULIN GLARGINE 100 UNIT/ML ~~LOC~~ SOLN
40.0000 [IU] | Freq: Every day | SUBCUTANEOUS | Status: DC
  Administered 2016-12-28: 40 [IU] via SUBCUTANEOUS
  Filled 2016-12-27 (×3): qty 0.4

## 2016-12-27 MED ORDER — SODIUM CHLORIDE 0.9 % IV BOLUS (SEPSIS)
500.0000 mL | INTRAVENOUS | Status: AC
  Administered 2016-12-27: 500 mL via INTRAVENOUS

## 2016-12-27 MED ORDER — GABAPENTIN 300 MG PO CAPS
300.0000 mg | ORAL_CAPSULE | Freq: Every day | ORAL | Status: DC
  Administered 2016-12-28 – 2016-12-29 (×2): 300 mg via ORAL
  Filled 2016-12-27 (×2): qty 1

## 2016-12-27 NOTE — ED Triage Notes (Signed)
Pt presents from home via [facility] after a fall outside yesterday. Pt states she was not using her walker and tripped and fell in dirt. Pt remembers whole event, denies LOC. Pt states today she came in because R knee swelling from fall, pain with movement. Pt has not bore weight on R leg. Pt has abraision to R knee and bridge of nose. EMS reports pt takes plavis. No other injuries noted, pt not complaining about anything but being cold. States she has iced knee but not taken anything for pain.

## 2016-12-27 NOTE — ED Provider Notes (Signed)
[facility] Emergency Department Provider Note  ____________________________________________   First MD Initiated Contact with Patient 12/27/16 1851     (approximate)  I have reviewed the triage vital signs and the nursing notes.   HISTORY  Chief Complaint Fall    HPI Molly Wolf is a 76 y.o. female who presents by EMS for evaluation of acute onset right knee pain that happened last night.  She had a mechanical fall at home.  Reportedly she was walking outside without her walker and she does have a history of  Parkinsonism.  She turned around and then lost her balance and fell on her right knee and her face.  She has some abrasions and contusions to her face and swelling and pain in the right knee.  They put ice on it and were hoping it would be better by today but today it is significantly more swollen and she cannot bear weight.  Any sort of movement of the right knee makes it worse and nothing makes it better.  She is on Plavix and has a prior cardiac stent.  She denies chest pain, shortness of breath, nausea, vomiting, abdominal pain.  She has no pain in any of her other extremities.  The pain in her right knee is both aching and sharp, severe, and slightly better with rest.  She did not lose consciousness when she fell and struck her face and has no neck pain or residual headache.  Past Medical History:  Diagnosis Date  . Anxiety   . Arthritis   . Chronic kidney disease   . Coronary artery disease   . Depression   . Diabetes mellitus without complication ([place])   . Hyperlipidemia   . Hypertension   . Parkinson disease ([place])   . Renal disorder     Patient Active Problem List   Diagnosis Date Noted  . Intractable pain 12/27/2016    Past Surgical History:  Procedure Laterality Date  . BACK SURGERY    . CORONARY ANGIOPLASTY WITH STENT PLACEMENT      Prior to Admission medications   Medication Sig Start Date End Date Taking? Authorizing  Provider  acetaminophen (TYLENOL) 325 MG tablet Take 650 mg by mouth every 6 (six) hours as needed for mild pain.   Yes Historical Provider, MD  atorvastatin (LIPITOR) 20 MG tablet Take 20 mg by mouth daily.   Yes Historical Provider, MD  Carbidopa-Levodopa ER (SINEMET CR) 25-100 MG tablet controlled release Take 2 tablets by mouth 4 (four) times daily.   Yes Historical Provider, MD  clopidogrel (PLAVIX) 75 MG tablet Take 75 mg by mouth daily.   Yes Historical Provider, MD  Coenzyme Q10 (COQ10) 50 MG CAPS Take 50 mg by mouth daily.   Yes Historical Provider, MD  CRANBERRY PO Take 1 tablet by mouth daily.   Yes Historical Provider, MD  escitalopram (LEXAPRO) 20 MG tablet Take 20 mg by mouth daily.   Yes Historical Provider, MD  furosemide (LASIX) 20 MG tablet Take 20 mg by mouth daily.    Yes Historical Provider, MD  gabapentin (NEURONTIN) 300 MG capsule Take 300 mg by mouth daily.    Yes Historical Provider, MD  hydrALAZINE (APRESOLINE) 50 MG tablet Take 50 mg by mouth 2 (two) times daily.    Yes Historical Provider, MD  insulin glargine (LANTUS) 100 UNIT/ML injection Inject 40 Units into the skin at bedtime.    Yes Historical Provider, MD  insulin lispro (HUMALOG) 100 UNIT/ML injection Inject 6  Units into the skin 3 (three) times daily with meals.    Yes Historical Provider, MD  isosorbide mononitrate (IMDUR) 60 MG 24 hr tablet Take 60 mg by mouth daily.   Yes Historical Provider, MD  loratadine (CLARITIN) 10 MG tablet Take 5 mg by mouth daily as needed for allergies.    Yes Historical Provider, MD  LORazepam (ATIVAN) 1 MG tablet Take 1 mg by mouth 2 (two) times daily.    Yes Historical Provider, MD  metoprolol tartrate (LOPRESSOR) 25 MG tablet Take 12.5 mg by mouth 2 (two) times daily.   Yes Historical Provider, MD  mirabegron ER (MYRBETRIQ) 25 MG TB24 tablet Take 25 mg by mouth daily.   Yes Historical Provider, MD  nitroGLYCERIN (NITROSTAT) 0.4 MG SL tablet Place 0.4 mg under the tongue every 5  (five) minutes as needed for chest pain.   Yes Historical Provider, MD  Omega-3 Fatty Acids (FISH OIL) 1000 MG CAPS Take 1,000 mg by mouth daily.   Yes Historical Provider, MD  polyethylene glycol (MIRALAX / GLYCOLAX) packet Take 17 g by mouth daily as needed for mild constipation.   Yes Historical Provider, MD  promethazine (PHENERGAN) 25 MG tablet Take 25 mg by mouth every 6 (six) hours as needed for nausea or vomiting.   Yes Historical Provider, MD  saccharomyces boulardii (FLORASTOR) 250 MG capsule Take 250 mg by mouth daily.   Yes Historical Provider, MD  traMADol (ULTRAM) 50 MG tablet Take 50 mg by mouth daily as needed.   Yes Historical Provider, MD  blood glucose meter kit and supplies KIT Dispense based on patient and insurance preference. Use up to four times daily as directed. (FOR ICD-9 250.00, 250.01). 11/06/16   Rudene Re, MD  cephALEXin (KEFLEX) 250 MG capsule Take 1 capsule (250 mg total) by mouth 3 (three) times daily. Patient not taking: Reported on 12/27/2016 12/08/16   Nori Riis, PA-C    Allergies Patient has no known allergies.  Family History  Problem Relation Age of Onset  . Cervical cancer Mother   . Kidney failure Father   . Prostate cancer Neg Hx   . Kidney cancer Neg Hx   . Bladder Cancer Neg Hx     Social History Social History  Substance Use Topics  . Smoking status: Never Smoker  . Smokeless tobacco: Never Used  . Alcohol use No    Review of Systems Constitutional: No fever/chills Eyes: No visual changes. ENT: No sore throat. Cardiovascular: Denies chest pain. Respiratory: Denies shortness of breath. Gastrointestinal: No abdominal pain.  No nausea, no vomiting.  No diarrhea.  No constipation. Genitourinary: Negative for dysuria. Musculoskeletal: Abrasions to her face and significant pain and swelling to her right knee Skin: Negative for rash. Neurological: Negative for headaches, focal weakness or numbness.  10-point ROS otherwise  negative.  ____________________________________________   PHYSICAL EXAM:  VITAL SIGNS: ED Triage Vitals  Enc Vitals Group     BP 12/27/16 1700 (!) 165/88     Pulse Rate 12/27/16 1639 77     Resp 12/27/16 1639 20     Temp 12/27/16 1639 98.1 F (36.7 C)     Temp Source 12/27/16 1639 Oral     SpO2 12/27/16 1639 100 %     Weight --      Height --      Head Circumference --      Peak Flow --      Pain Score 12/27/16 1639 5     Pain  Loc --      Pain Edu? --      Excl. in [place]? --     Constitutional: Alert and oriented. No acute distress. Eyes: Conjunctivae are normal. PERRL. EOMI. Head: Abrasions to her face most notable to the bridge of her nose.  No lacerations requiring repair Nose: No congestion/rhinnorhea. Mouth/Throat: Mucous membranes are moist. Neck: No stridor.  No meningeal signs.  No cervical spine tenderness to palpation. Cardiovascular: Normal rate, regular rhythm. Good peripheral circulation. Grossly normal heart sounds. Respiratory: Normal respiratory effort.  No retractions. Lungs CTAB. Gastrointestinal: Soft and nontender. No distention.  Musculoskeletal: Significant swelling and ecchymosis to her right knee with severe tenderness with any amount of flexion, extension, or palpation.  Neurovascularly intact distal to the injury.  No other injuries to her extremities can be appreciated Neurologic:  Normal speech and language. No gross focal neurologic deficits are appreciated.  Skin:  Skin is warm, dry and intact. No rash noted. Psychiatric: Mood and affect are normal. Speech and behavior are normal.  ____________________________________________   LABS (all labs ordered are listed, but only abnormal results are displayed)  Labs Reviewed  CBC WITH DIFFERENTIAL/PLATELET - Abnormal; Notable for the following:       Result Value   RBC 3.52 (*)    Hemoglobin 10.9 (*)    HCT 30.8 (*)    Lymphs Abs 0.8 (*)    All other components within normal limits    COMPREHENSIVE METABOLIC PANEL - Abnormal; Notable for the following:    Glucose, Bld 160 (*)    BUN 43 (*)    Creatinine, Ser 2.50 (*)    Calcium 8.8 (*)    AST 14 (*)    ALT 5 (*)    GFR calc non Af Amer 17 (*)    GFR calc Af Amer 20 (*)    All other components within normal limits  GLUCOSE, CAPILLARY - Abnormal; Notable for the following:    Glucose-Capillary 261 (*)    All other components within normal limits  PROTIME-INR  BASIC METABOLIC PANEL  CBC   ____________________________________________  EKG  ED ECG REPORT I, Soul Deveney, the attending physician, personally viewed and interpreted this ECG.  Date: 12/28/2016 EKG Time: 22:04 Rate: 86 Rhythm: sinus with multiform ventricular complexes QRS Axis: normal Intervals: LVH ST/T Wave abnormalities: normal Conduction Disturbances: none Narrative Interpretation: Non-specific ST segment / T-wave changes, but no evidence of acute ischemia.  ____________________________________________  RADIOLOGY   Ct Head Wo Contrast  Result Date: 12/27/2016 CLINICAL DATA:  Fall yesterday striking her head. History of multiple falls. EXAM: CT HEAD WITHOUT CONTRAST TECHNIQUE: Contiguous axial images were obtained from the base of the skull through the vertex without intravenous contrast. COMPARISON:  10/25/2016 FINDINGS: Brain: The brainstem, cerebellum, cerebral peduncles, thalami, basal ganglia, basilar cisterns, and ventricular system appear within normal limits. Periventricular white matter and corona radiata hypodensities favor chronic ischemic microvascular white matter disease. No intracranial hemorrhage, mass lesion, or acute CVA. Vascular: There is atherosclerotic calcification of the cavernous carotid arteries bilaterally. Atherosclerotic calcification of the vertebral arteries. Skull: Hyperostosis frontalis interna. Chronic calcifications along the dura, as on prior exams. Sinuses/Orbits: Unremarkable Other: No supplemental  non-categorized findings. IMPRESSION: 1. No acute intracranial findings. 2. Periventricular white matter and corona radiata hypodensities favor chronic ischemic microvascular white matter disease. 3. Atherosclerosis. Electronically Signed   By: Van Clines M.D.   On: 12/27/2016 18:02   Ct Knee Right Wo Contrast  Result Date: 12/27/2016 CLINICAL DATA:  Knee pain and swelling post fall with patellar fracture. EXAM: CT OF THE RIGHT KNEE WITHOUT CONTRAST TECHNIQUE: Multidetector CT imaging of the RIGHT knee was performed according to the standard protocol. Multiplanar CT image reconstructions were also generated. COMPARISON:  Knee radiograph 12/27/2016 FINDINGS: Bones/Joint/Cartilage Generalized osteopenia. There is a transverse minimally displaced mildly comminuted fracture of the patella with associated hemorrhagic suprapatellar joint effusion. There is a 3 compartment osteoarthritis, moderate in severity of the right knee joint. No other fractures of seen. There is anterior soft tissue swelling about the knee. Ligaments Suboptimally assessed by CT. Muscles and Tendons Appear intact. Soft tissues Calcific atherosclerotic calcifications noted. IMPRESSION: Generalized osteopenia. Transverse minimally displaced mildly comminuted fracture of the patella with associated hemorrhagic suprapatellar joint effusion. 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 Electronically Signed   By: Fidela Salisbury M.D.   On: 12/27/2016 20:26   Dg Knee 3 View Right  Result Date: 12/27/2016 CLINICAL DATA:  Pain after fall yesterday EXAM: RIGHT KNEE - 3 VIEW COMPARISON:  None. FINDINGS: Lucencies project through the patella on the AP and oblique views, not obvious on the lateral view. Tricompartmental degenerative changes are seen. 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 Vascular calcifications are noted. There is a joint effusion in the suprapatellar space. IMPRESSION:  Lucencies through the patella best seen on the AP and oblique view with overlying soft tissue swelling and a joint effusion are concerning for a comminuted patellar fracture. The suspected fracture is not as well seen on the lateral view as typical. Recommend clinical correlation. Electronically Signed   By: Dorise Bullion III M.D   On: 12/27/2016 17:25    ____________________________________________   PROCEDURES  Procedure(s) performed:   Procedures   Critical Care performed: No ____________________________________________   INITIAL IMPRESSION / ASSESSMENT AND PLAN / ED COURSE  Pertinent labs & imaging results that were available during my care of the patient were reviewed by me and considered in my medical decision making (see chart for details).  The patient received a CT scan of her head and x-rays of her right knee while she was in triage.  The head CT is within normal limits and her knee radiographs were somewhat contradictory, suggesting a possible patellar fracture but appearing normal from the lateral view.  The patient is in a significant amount of pain even at rest so we are placing an IV, giving morphine, checking basic labs into spitting that surgery might be required, and obtaining a CT scan of her right knee without contrast for further evaluation.   Clinical Course as of Dec 28 116  Sun Dec 27, 2016  2113 I spoke by phone with Dr. Rudene Christians who reviewed the imaging and felt that the patient could be managed nonoperatively.  He recommended a knee immobilizer and follow-up within 3 days in clinic.  He said that she can bear full weight as long as the knee immobilizers on.  However I updated the patient and her daughter, the daughter and I share serious concerns that the patient is safe to go home.  She has significant parkinsonism and a significant resting tremor.  She is already weak ambulates with a walker.  She has slightly elevated creatinine over her baseline and has not  been eating or drinking today.  I think that she would benefit strongly from being admitted to manage her chronic medical issues and acute volume depletion and manage her acute pain in the right knee, obtain a consultation orthopedics, and obtain  consultations to PT and OT because she may need placement in rehabilitation or skilled nursing.  I have paged the hospitalist and will discuss this plan with her.  Currently the patient does have intractable pain and I will continue to treat with morphine IV  [CF]    Clinical Course User Index [CF] Hinda Kehr, MD    ____________________________________________  FINAL CLINICAL IMPRESSION(S) / ED DIAGNOSES  Final diagnoses:  Acute pain of right knee  Closed nondisplaced transverse fracture of right patella, initial encounter  Effusion of bursa of right knee  Parkinsonism, unspecified Parkinsonism type ([place])  Acute on chronic renal insufficiency  Intractable pain     MEDICATIONS GIVEN DURING THIS VISIT:  Medications  morphine 2 MG/ML injection 2 mg (2 mg Intravenous Given 12/27/16 2205)  metoprolol tartrate (LOPRESSOR) tablet 12.5 mg ( Oral Not Given 12/27/16 2331)  atorvastatin (LIPITOR) tablet 20 mg (not administered)  Carbidopa-Levodopa ER (SINEMET CR) 25-100 MG tablet controlled release 2 tablet (2 tablets Oral Not Given 12/28/16 0027)  clopidogrel (PLAVIX) tablet 75 mg (not administered)  escitalopram (LEXAPRO) tablet 20 mg (not administered)  furosemide (LASIX) tablet 20 mg (not administered)  gabapentin (NEURONTIN) capsule 300 mg (not administered)  hydrALAZINE (APRESOLINE) tablet 50 mg ( Oral Duplicate 06/07/06 8675)  insulin glargine (LANTUS) injection 40 Units (40 Units Subcutaneous Given 12/28/16 0023)  isosorbide mononitrate (IMDUR) 24 hr tablet 60 mg (not administered)  saccharomyces boulardii (FLORASTOR) capsule 250 mg (not administered)  promethazine (PHENERGAN) tablet 25 mg (not administered)  nitroGLYCERIN (NITROSTAT) SL tablet  0.4 mg (not administered)  mirabegron ER (MYRBETRIQ) tablet 25 mg (not administered)  LORazepam (ATIVAN) tablet 1 mg (1 mg Oral Given 12/28/16 0022)  loratadine (CLARITIN) tablet 5 mg (not administered)  acetaminophen (TYLENOL) tablet 650 mg (not administered)    Or  acetaminophen (TYLENOL) suppository 650 mg (not administered)  oxyCODONE (Oxy IR/ROXICODONE) immediate release tablet 5 mg (not administered)  senna-docusate (Senokot-S) tablet 1 tablet (not administered)  bisacodyl (DULCOLAX) EC tablet 5 mg (not administered)  magnesium citrate solution 1 Bottle (not administered)  ondansetron (ZOFRAN) tablet 4 mg (not administered)    Or  ondansetron (ZOFRAN) injection 4 mg (not administered)  albuterol (PROVENTIL) (2.5 MG/3ML) 0.083% nebulizer solution 2.5 mg (not administered)  ipratropium (ATROVENT) nebulizer solution 0.5 mg (not administered)  heparin injection 5,000 Units (5,000 Units Subcutaneous Not Given 12/28/16 0013)  morphine 2 MG/ML injection 1 mg (not administered)  insulin aspart (novoLOG) injection 0-15 Units (not administered)  insulin aspart (novoLOG) injection 0-5 Units (3 Units Subcutaneous Given 12/28/16 0023)  Influenza vac split quadrivalent PF (FLUARIX) injection 0.5 mL (not administered)  morphine 2 MG/ML injection 2 mg (2 mg Intravenous Given 12/27/16 1927)  ondansetron (ZOFRAN) injection 4 mg (4 mg Intravenous Given 12/27/16 1927)  sodium chloride 0.9 % bolus 500 mL (0 mLs Intravenous Stopped 12/27/16 2207)  morphine 2 MG/ML injection 2 mg (2 mg Intravenous Given 12/27/16 2125)     NEW OUTPATIENT MEDICATIONS STARTED DURING THIS VISIT:  Current Discharge Medication List      Current Discharge Medication List      Current Discharge Medication List       Note:  This document was prepared using Dragon voice recognition software and may include unintentional dictation errors.    Hinda Kehr, MD 12/28/16 [phone]

## 2016-12-27 NOTE — ED Notes (Signed)
Pt placed on bedpan and urinated. Cleaned and new brief placed on pt.

## 2016-12-27 NOTE — ED Notes (Signed)
Pt given milk and ate half a hamburger from family member.

## 2016-12-27 NOTE — H&P (Signed)
History and Physical   [facility] [place] [facility] Admission History and Physical [employer], D.O.    Patient Name: Molly Wolf MR#: [id number] Date of Birth: 1941/07/30 Date of Admission: 12/27/2016  Referring MD/NP/PA: Dr. Karma Greaser Primary Care Physician: Suann Larry, MD Patient coming from: Home Outpatient Specialists: Urology, [facility], [place]   Chief Complaint:  Chief Complaint  Patient presents with  . Fall    HPI: Molly Wolf is a 76 y.o. female with a known history of arthritis, anxiety, CKD, CAD, depression, DM, HLD, HTN, Parkinson's presents to the emergency department for evaluation of Fall.  Patient was in a usual state of health until This evening when she sustained a mechanical fall while walking without her walker. She did sustain head trauma but denies any loss of consciousness, preceding symptoms such as dizziness, lightheadedness, palpitations, chest pain or shortness of breath. At baseline patient has a tremor secondary to Parkinson's. She lost her balance landing on her right knee and was unable to bear weight following the fall. She complains of severe sharp knee pain anteriorly with movement, better with rest.   Otherwise there has been no change in status. Patient has been taking medication as prescribed and there has been no recent change in medication or diet.  No recent antibiotics.  There has been no recent illness, hospitalizations, travel or sick contacts.    Patient denies fevers/chills, weakness, dizziness, chest pain, shortness of breath, N/V/C/D, abdominal pain, dysuria/frequency, changes in mental status.   ED Course: Patient received Morphine '2mg'$  IV x 3, Zofran '4mg'$  x 1.  Review of Systems:  CONSTITUTIONAL: No fever/chills, fatigue, weakness, weight gain/loss, headache. EYES: No blurry or double vision. ENT: No tinnitus, postnasal drip, redness or soreness of the oropharynx. RESPIRATORY: No cough, dyspnea, wheeze.  No  hemoptysis.  CARDIOVASCULAR: No chest pain, palpitations, syncope, orthopnea. No lower extremity edema.  GASTROINTESTINAL: No nausea, vomiting, abdominal pain, diarrhea, constipation.  No hematemesis, melena or hematochezia. GENITOURINARY: No dysuria, frequency, hematuria. ENDOCRINE: No polyuria or nocturia. No heat or cold intolerance. HEMATOLOGY: No anemia, bruising, bleeding. INTEGUMENTARY: No rashes, ulcers, lesions. MUSCULOSKELETAL: No arthritis, gout, dyspnea. Positive right knee pain with movement NEUROLOGIC: No numbness, tingling, ataxia, seizure-type activity, weakness. PSYCHIATRIC: No anxiety, depression, insomnia.   Past Medical History:  Diagnosis Date  . Anxiety   . Arthritis   . Chronic kidney disease   . Coronary artery disease   . Depression   . Diabetes mellitus without complication ([place])   . Hyperlipidemia   . Hypertension   . Parkinson disease ([place])   . Renal disorder     Past Surgical History:  Procedure Laterality Date  . BACK SURGERY    . CORONARY ANGIOPLASTY WITH STENT PLACEMENT       reports that she has never smoked. She has never used smokeless tobacco. She reports that she does not drink alcohol or use drugs.  No Known Allergies  Family History  Problem Relation Age of Onset  . Cervical cancer Mother   . Kidney failure Father   . Prostate cancer Neg Hx   . Kidney cancer Neg Hx   . Bladder Cancer Neg Hx     Prior to Admission medications   Medication Sig Start Date End Date Taking? Authorizing Provider  acetaminophen (TYLENOL) 325 MG tablet Take 650 mg by mouth every 6 (six) hours as needed for mild pain.   Yes Historical Provider, MD  atorvastatin (LIPITOR) 20 MG tablet Take 20 mg by  mouth daily.   Yes Historical Provider, MD  Carbidopa-Levodopa ER (SINEMET CR) 25-100 MG tablet controlled release Take 2 tablets by mouth 4 (four) times daily.   Yes Historical Provider, MD  clopidogrel (PLAVIX) 75 MG tablet Take 75 mg by mouth daily.   Yes  Historical Provider, MD  Coenzyme Q10 (COQ10) 50 MG CAPS Take 50 mg by mouth daily.   Yes Historical Provider, MD  CRANBERRY PO Take 1 tablet by mouth daily.   Yes Historical Provider, MD  escitalopram (LEXAPRO) 20 MG tablet Take 20 mg by mouth daily.   Yes Historical Provider, MD  furosemide (LASIX) 20 MG tablet Take 20 mg by mouth daily.    Yes Historical Provider, MD  gabapentin (NEURONTIN) 300 MG capsule Take 300 mg by mouth daily.    Yes Historical Provider, MD  hydrALAZINE (APRESOLINE) 50 MG tablet Take 50 mg by mouth 2 (two) times daily.    Yes Historical Provider, MD  insulin glargine (LANTUS) 100 UNIT/ML injection Inject 40 Units into the skin at bedtime.    Yes Historical Provider, MD  insulin lispro (HUMALOG) 100 UNIT/ML injection Inject 6 Units into the skin 3 (three) times daily with meals.    Yes Historical Provider, MD  isosorbide mononitrate (IMDUR) 60 MG 24 hr tablet Take 60 mg by mouth daily.   Yes Historical Provider, MD  loratadine (CLARITIN) 10 MG tablet Take 5 mg by mouth daily as needed for allergies.    Yes Historical Provider, MD  LORazepam (ATIVAN) 1 MG tablet Take 1 mg by mouth 2 (two) times daily.    Yes Historical Provider, MD  metoprolol tartrate (LOPRESSOR) 25 MG tablet Take 12.5 mg by mouth 2 (two) times daily.   Yes Historical Provider, MD  mirabegron ER (MYRBETRIQ) 25 MG TB24 tablet Take 25 mg by mouth daily.   Yes Historical Provider, MD  nitroGLYCERIN (NITROSTAT) 0.4 MG SL tablet Place 0.4 mg under the tongue every 5 (five) minutes as needed for chest pain.   Yes Historical Provider, MD  Omega-3 Fatty Acids (FISH OIL) 1000 MG CAPS Take 1,000 mg by mouth daily.   Yes Historical Provider, MD  polyethylene glycol (MIRALAX / GLYCOLAX) packet Take 17 g by mouth daily as needed for mild constipation.   Yes Historical Provider, MD  promethazine (PHENERGAN) 25 MG tablet Take 25 mg by mouth every 6 (six) hours as needed for nausea or vomiting.   Yes Historical Provider, MD   saccharomyces boulardii (FLORASTOR) 250 MG capsule Take 250 mg by mouth daily.   Yes Historical Provider, MD  traMADol (ULTRAM) 50 MG tablet Take 50 mg by mouth daily as needed.   Yes Historical Provider, MD  blood glucose meter kit and supplies KIT Dispense based on patient and insurance preference. Use up to four times daily as directed. (FOR ICD-9 250.00, 250.01). 11/06/16   Rudene Re, MD  cephALEXin (KEFLEX) 250 MG capsule Take 1 capsule (250 mg total) by mouth 3 (three) times daily. Patient not taking: Reported on 12/27/2016 12/08/16   Nori Riis, PA-C    Physical Exam: Vitals:   12/27/16 1835 12/27/16 1915 12/27/16 1930 12/27/16 2030  BP: (!) 158/112 (!) 192/96 (!) 197/82 (!) 168/76  Pulse: 74 76 75 78  Resp: _0 Temp:      TempSrc:      SpO2: 99% 100% 98% 92%    GENERAL: 76 y.o.-year-old female patient, well-developed, well-nourished lying in the bed in no acute distress.  Pleasant  and cooperative.   HEENT: Head, normocephalic.Positive facial abrasions to the bridge of her nose Pupils equal, round, reactive to light and accommodation. No scleral icterus. Extraocular muscles intact. Nares are patent. Oropharynx is clear. Mucus membranes moist. NECK: Supple, full range of motion. No JVD, no bruit heard.  CHEST: Normal breath sounds bilaterally. No wheezing, rales, rhonchi or crackles. No use of accessory muscles of respiration.  No reproducible chest wall tenderness.  CARDIOVASCULAR: S1, S2 normal. No murmurs, rubs, or gallops. Cap refill <2 seconds. Pulses intact distally.  ABDOMEN: Soft, nondistended, nontender. No rebound, guarding, rigidity. Normoactive bowel sounds present in all four quadrants. No organomegaly or mass. EXTREMI.TIES: Positive right knee is in an immobilizer. There does not appear to be any distal edema. No anosis, or clubbing. No calf tenderness or Homan's sign.  NEUROLOGIC: The patient is alert and oriented x 3. Cranial nerves II through XII  are grossly intact with no focal sensorimotor deficit. Muscle strength 5/5 in all extremities. Sensation intact. Gait not checked. PSYCHIATRIC:  Normal affect, mood, thought content. SKIN: Warm, dry, and intact without obvious rash, lesion, or ulcer.    Labs on Admission:  CBC:  Recent Labs Lab 12/27/16 1910  WBC 6.8  NEUTROABS 5.4  HGB 10.9*  HCT 30.8*  MCV 87.4  PLT 935   Basic Metabolic Panel:  Recent Labs Lab 12/27/16 1910  NA 136  K 4.5  CL 108  CO2 23  GLUCOSE 160*  BUN 43*  CREATININE 2.50*  CALCIUM 8.8*   GFR: CrCl cannot be calculated (Unknown ideal weight.). Liver Function Tests:  Recent Labs Lab 12/27/16 1910  AST 14*  ALT 5*  ALKPHOS 64  BILITOT 1.0  PROT 6.9  ALBUMIN 3.6   No results for input(s): LIPASE, AMYLASE in the last 168 hours. No results for input(s): AMMONIA in the last 168 hours. Coagulation Profile:  Recent Labs Lab 12/27/16 1910  INR 1.02   Cardiac Enzymes: No results for input(s): CKTOTAL, CKMB, CKMBINDEX, TROPONINI in the last 168 hours. BNP (last 3 results) No results for input(s): PROBNP in the last 8760 hours. HbA1C: No results for input(s): HGBA1C in the last 72 hours. CBG: No results for input(s): GLUCAP in the last 168 hours. Lipid Profile: No results for input(s): CHOL, HDL, LDLCALC, TRIG, CHOLHDL, LDLDIRECT in the last 72 hours. Thyroid Function Tests: No results for input(s): TSH, T4TOTAL, FREET4, T3FREE, THYROIDAB in the last 72 hours. Anemia Panel: No results for input(s): VITAMINB12, FOLATE, FERRITIN, TIBC, IRON, RETICCTPCT in the last 72 hours. Urine analysis:  Sepsis Labs: _0 (procalcitonin:4,lacticidven:4) )No results found for this or any previous visit (from the past 240 hour(s)).   Radiological Exams on Admission: Ct Head Wo Contrast  Result Date: 12/27/2016 CLINICAL DATA:  Fall yesterday striking her head. History of multiple falls. EXAM: CT HEAD WITHOUT CONTRAST TECHNIQUE: Contiguous  axial images were obtained from the base of the skull through the vertex without intravenous contrast. COMPARISON:  10/25/2016 FINDINGS: Brain: The brainstem, cerebellum, cerebral peduncles, thalami, basal ganglia, basilar cisterns, and ventricular system appear within normal limits. Periventricular white matter and corona radiata hypodensities favor chronic ischemic microvascular white matter disease. No intracranial hemorrhage, mass lesion, or acute CVA. Vascular: There is atherosclerotic calcification of the cavernous carotid arteries bilaterally. Atherosclerotic calcification of the vertebral arteries. Skull: Hyperostosis frontalis interna. Chronic calcifications along the dura, as on prior exams. Sinuses/Orbits: Unremarkable Other: No supplemental non-categorized findings. IMPRESSION: 1. No acute intracranial findings. 2. Periventricular white matter and corona radiata hypodensities favor  chronic ischemic microvascular white matter disease. 3. Atherosclerosis. Electronically Signed   By: Van Clines M.D.   On: 12/27/2016 18:02   Ct Knee Right Wo Contrast  Result Date: 12/27/2016 CLINICAL DATA:  Knee pain and swelling post fall with patellar fracture. EXAM: CT OF THE RIGHT KNEE WITHOUT CONTRAST TECHNIQUE: Multidetector CT imaging of the RIGHT knee was performed according to the standard protocol. Multiplanar CT image reconstructions were also generated. COMPARISON:  Knee radiograph 12/27/2016 FINDINGS: Bones/Joint/Cartilage Generalized osteopenia. There is a transverse minimally displaced mildly comminuted fracture of the patella with associated hemorrhagic suprapatellar joint effusion. There is a 3 compartment osteoarthritis, moderate in severity of the right knee joint. No other fractures of seen. There is anterior soft tissue swelling about the knee. Ligaments Suboptimally assessed by CT. Muscles and Tendons Appear intact. Soft tissues Calcific atherosclerotic calcifications noted. IMPRESSION:  Generalized osteopenia. Transverse minimally displaced mildly comminuted fracture of the patella with associated hemorrhagic suprapatellar joint effusion. 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 Electronically Signed   By: Fidela Salisbury M.D.   On: 12/27/2016 20:26   Dg Knee 3 View Right  Result Date: 12/27/2016 CLINICAL DATA:  Pain after fall yesterday EXAM: RIGHT KNEE - 3 VIEW COMPARISON:  None. FINDINGS: Lucencies project through the patella on the AP and oblique views, not obvious on the lateral view. Tricompartmental degenerative changes are seen. 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 Vascular calcifications are noted. There is a joint effusion in the suprapatellar space. IMPRESSION: Lucencies through the patella best seen on the AP and oblique view with overlying soft tissue swelling and a joint effusion are concerning for a comminuted patellar fracture. The suspected fracture is not as well seen on the lateral view as typical. Recommend clinical correlation. Electronically Signed   By: Dorise Bullion III M.D   On: 12/27/2016 17:25    EKG: Sinus at 86 bpm, normal axis, LVH, nonspecific ST and T wave changes.  Assessment/Plan  This is a 76 y.o. female with a history of arthritis, anxiety, CKD, CAD, depression, DM, HLD, HTN, Parkinson's  now being admitted with:  #. Intractable pain / gait instability secondary to patellar fracture - Admit inpatient - Pain control - Knee immobilizer - Ortho and PT consults  #. History of HTN - Continue metoprolol, Lasix, hydralazine  #. History of HLD - Continue Lipitor  #. History of Parkinson - Continue Sinemet  #. History of CAD - Continue Plavix, nitro  #. History of depression - Continue Lexapro, Ativan  #. History of DM - Continue Lantus - RISS achs  #. History of urinary incontinence - Continue Myrbetriq  #. History of CK D, near baseline -Monitor BMP in a.m.  #.  History of Anemia -Repeat CBC in a.m.  Admission status: Inpatient IV Fluids: HL Diet/Nutrition: HH, CC Consults called: Ortho, PT  DVT Px: Lovenox, SCDs and early ambulation. Code Status: Full Code  Disposition Plan: To rehab in 3-4 days  All the records are reviewed and case discussed with ED provider. Management plans discussed with the patient who express understanding and agree with plan of care.  Tallin Hart D.O. on 12/27/2016 at 9:17 PM Between 7am to 6pm - Pager - [phone] After 6pm go to [email or web address] - [employer] [facility] [place] [facility] Office [phone] CC: Primary care physician; Suann Larry, MD   12/27/2016, 9:17 PM

## 2016-12-28 ENCOUNTER — Other Ambulatory Visit: Payer: Medicare Other | Admitting: Urology

## 2016-12-28 LAB — GLUCOSE, CAPILLARY
GLUCOSE-CAPILLARY: 90 mg/dL (ref 65–99)
Glucose-Capillary: 107 mg/dL — ABNORMAL HIGH (ref 65–99)
Glucose-Capillary: 266 mg/dL — ABNORMAL HIGH (ref 65–99)

## 2016-12-28 LAB — BASIC METABOLIC PANEL
Anion gap: 4 — ABNORMAL LOW (ref 5–15)
BUN: 46 mg/dL — ABNORMAL HIGH (ref 6–20)
CO2: 25 mmol/L (ref 22–32)
Calcium: 8.3 mg/dL — ABNORMAL LOW (ref 8.9–10.3)
Chloride: 108 mmol/L (ref 101–111)
Creatinine, Ser: 2.39 mg/dL — ABNORMAL HIGH (ref 0.44–1.00)
GFR calc Af Amer: 21 mL/min — ABNORMAL LOW (ref >60)
GFR, EST NON AFRICAN AMERICAN: 18 mL/min — AB (ref >60)
GLUCOSE: 180 mg/dL — AB (ref 65–99)
Potassium: 4.2 mmol/L (ref 3.5–5.1)
Sodium: 137 mmol/L (ref 135–145)

## 2016-12-28 LAB — CBC
HEMATOCRIT: 27.4 % — AB (ref 35.0–47.0)
HEMOGLOBIN: 9.6 g/dL — AB (ref 12.0–16.0)
MCH: 30.8 pg (ref 26.0–34.0)
MCHC: 35 g/dL (ref 32.0–36.0)
MCV: 87.8 fL (ref 80.0–100.0)
Platelets: 154 10*3/uL (ref 150–440)
RBC: 3.12 MIL/uL — ABNORMAL LOW (ref 3.80–5.20)
RDW: 13 % (ref 11.5–14.5)
WBC: 6 10*3/uL (ref 3.6–11.0)

## 2016-12-28 MED ORDER — HYDRALAZINE HCL 50 MG PO TABS
50.0000 mg | ORAL_TABLET | Freq: Four times a day (QID) | ORAL | Status: DC
  Administered 2016-12-28 – 2016-12-29 (×4): 50 mg via ORAL
  Filled 2016-12-28 (×4): qty 1

## 2016-12-28 NOTE — Progress Notes (Signed)
Family Meeting Note  Advance Directive:yes  Today a meeting took place with the Patient.  The following clinical team members were present during this meeting:MD  The following were discussed:Patient's diagnosis: , Patient's progosis: > 12 months and Goals for treatment: DNR  Additional follow-up to be provided: Palliative care c/s - Patient's daughter states patient has papers for same although patient couldn't confirm. I've asked them to bring it over if they have it. They are all in agreement for DNR, she may benefit from Palliative care to follow on D/C. I see continued decline from this point forward. Daughter is in agreement.  Time spent during discussion:20 minutes  Delfino LovettVipul Dan Dissinger, MD

## 2016-12-28 NOTE — Consult Note (Signed)
76 year old female with parkinsonism who suffered a patella fracture somewhat stellate without much displacement is seen at bedside. She has weakness to her quads she is able flex extend her toes X-ray show a comminuted fracture with 3 large fragments but almost no displacement. Extensor mechanism should be intact. Recommendation is to weight-bear as tolerated with knee in extension of follow-up will be in about 2 weeks if there is no displacement that point she'll require ORIF if not hopefully get her into a better brace up to 8 weeks of immobilization

## 2016-12-28 NOTE — Clinical Social Work Placement (Signed)
   CLINICAL SOCIAL WORK PLACEMENT  NOTE  Date:  12/28/2016  Patient Details  Name: Molly Wolf MRN: [id number] Date of Birth: 1940/12/01  Clinical Social Work is seeking post-discharge placement for this patient at the Skilled  Nursing Facility level of care (*CSW will initial, date and re-position this form in  chart as items are completed):  Yes   Patient/family provided with [place] [place] Clinical Social Work Department's list of facilities offering this level of care within the geographic area requested by the patient (or if unable, by the patient's family).  Yes   Patient/family informed of their freedom to choose among providers that offer the needed level of care, that participate in Medicare, Medicaid or managed care program needed by the patient, have an available bed and are willing to accept the patient.  Yes   Patient/family informed of [facility] interest [facility], as well as of the fact that they are under no obligation to receive care at these facilities.  PASRR submitted to EDS on 12/28/16     PASRR number received on       Existing PASRR number confirmed on 12/28/16     FL2 transmitted to all facilities in geographic area requested by pt/family on 12/28/16     FL2 transmitted to all facilities within larger geographic area on       Patient informed that his/her managed care company has contracts with or will negotiate with certain facilities, including the following:            Patient/family informed of bed offers received.  Patient chooses bed at       Physician recommends and patient chooses bed at      Patient to be transferred to   on  .  Patient to be transferred to facility by       Patient family notified on   of transfer.  Name of family member notified:        PHYSICIAN Please sign FL2, Please sign DNR     Additional Comment:    _______________________________________________ Darleene CleaverAnterhaus, Steffany Schoenfelder R,  LCSWA 12/28/2016, 5:15 PM

## 2016-12-28 NOTE — Evaluation (Signed)
Physical Therapy Evaluation Patient Details Name: Molly Wolf MRN: [id number] DOB: [birth year] Today's Date: 12/28/2016   History of Present Illness  presented to ER status post mechanical fall with R knee pain; admitted with R patellar fracture (comminuted, very minimally displaced).  Clinical Impression  Upon evaluation, patient alert and oriented to basic information; follows commands and demonstrates good effort/participation with all functional activities.  Bledsoe brace applied to R LE (replaced standard KI), locked in extension, for use throughout session per MD order.  Demonstrates fair R LE quad set, requires act assist to complete SLR and other supine therex (due to pain).  Significant Parkinsonian tremor throughout head, R hemi-body.  Able to complete bed mobility with mod assist (for management of R LE); requires mod assist +2 for sit/stand and bed/chair (4-5 steps) with RW.  Heavy tendency towards posterior LOB, step by step cuing for sequence and task completion. Minimal increase in pain with WBing; unable to tolerate additional activity due to fatigue, fear of falling. Would benefit from skilled PT to address above deficits and promote optimal return to PLOF; recommend transition to STR upon discharge from acute hospitalization.     Follow Up Recommendations SNF    Equipment Recommendations       Recommendations for Other Services       Precautions / Restrictions Precautions Precautions: Fall Required Braces or Orthoses:  (Bledsoe brace, locked in extension, to R knee at all times) Restrictions Weight Bearing Restrictions: Yes RLE Weight Bearing: Weight bearing as tolerated (with Bledsoe brace in place)      Mobility  Bed Mobility Overal bed mobility: Needs Assistance Bed Mobility: Supine to Sit     Supine to sit: Min assist;Mod assist     General bed mobility comments: assist for management of R LE and scooting forward to edge of bed  Transfers Overall  transfer level: Needs assistance Equipment used: Rolling walker (2 wheeled) Transfers: Sit to/from Stand Sit to Stand: Mod assist;+2 physical assistance         General transfer comment: assist for lift off, standing balance; tendancy towards posterior LOB  Ambulation/Gait Ambulation/Gait assistance: Mod assist;+2 physical assistance Ambulation Distance (Feet): 4 Feet Assistive device: Rolling walker (2 wheeled)       General Gait Details: 3-point, step to gait pattern with step by step cuing required to sequence; increase time to process with mild 'freezing' episodes apparent intermittently.  Minimal increase in R LE pain with WBing efforts.  Poor balance, high fall risk; unsafe to attempt without +2 at this time.  Stairs            Wheelchair Mobility    Modified Rankin (Stroke Patients Only)       Balance Overall balance assessment: Needs assistance Sitting-balance support: No upper extremity supported;Feet supported Sitting balance-Leahy Scale: Good     Standing balance support: Bilateral upper extremity supported Standing balance-Leahy Scale: Poor                               Pertinent Vitals/Pain Pain Assessment: 0-10 Pain Score: 4  Pain Location: R knee Pain Descriptors / Indicators: Aching;Guarding Pain Intervention(s): Limited activity within patient's tolerance;Monitored during session;Repositioned    Home Living Family/patient expects to be discharged to:: Private residence Living Arrangements: Children;Other relatives (son and grandchildren; reports someone in the home 24/7) Available Help at Discharge: Family;Available 24 hours/day Type of Home: House Home Access: Stairs to enter Entrance Stairs-Rails: Right;Left;Can reach both Entrance  Stairs-Number of Steps: 2-3 Home Layout: One level Home Equipment: Walker - 4 wheels;Wheelchair - manual      Prior Function Level of Independence: Independent with assistive device(s)          Comments: Sup/mod indep for household mobilization, assist from grand-daughter for ADLs/bathing as needed; utilizes manual WC for longer, community distances.  Does endorse multiple fall history.     Hand Dominance   Dominant Hand: Right    Extremity/Trunk Assessment   Upper Extremity Assessment Upper Extremity Assessment: Overall WFL for tasks assessed (Parkinsonian tremor in head, R UE)    Lower Extremity Assessment Lower Extremity Assessment: Generalized weakness (R LE grossly at least 2+ to 3-/5, limited by pain in R knee.  Fair quad set, completes SLR with act assist from therapist.  Parkinsonian tremor in R LE)       Communication   Communication: No difficulties  Cognition Arousal/Alertness: Awake/alert Behavior During Therapy: WFL for tasks assessed/performed Overall Cognitive Status: Within Functional Limits for tasks assessed                      General Comments      Exercises Other Exercises Other Exercises: Supine R LE quad sets, 1x5, AROM; act assist SLR and hip abduct/adduct, 1x5. Other Exercises: Fitted/adjusted bledsoe brace to R LE beginning of session for use with all therex, mobility throughout.  Locked in extension per MD order.   Assessment/Plan    PT Assessment Patient needs continued PT services  PT Problem List Decreased strength;Decreased range of motion;Decreased activity tolerance;Decreased balance;Decreased mobility;Decreased coordination;Decreased cognition;Decreased knowledge of use of DME;Decreased safety awareness;Decreased knowledge of precautions       PT Treatment Interventions DME instruction;Gait training;Stair training;Functional mobility training;Therapeutic activities;Therapeutic exercise;Balance training;Patient/family education    PT Goals (Current goals can be found in the Care Plan section)  Acute Rehab PT Goals Patient Stated Goal: to get better PT Goal Formulation: With patient Time For Goal Achievement:  01/11/17 Potential to Achieve Goals: Good    Frequency 7X/week   Barriers to discharge Decreased caregiver support      Co-evaluation               End of Session Equipment Utilized During Treatment: Gait belt Activity Tolerance: Patient tolerated treatment well Patient left: in chair;with call bell/phone within reach Nurse Communication: Mobility status PT Visit Diagnosis: Pain;Muscle weakness (generalized) (M62.81);Repeated falls (R29.6) Pain - Right/Left: Right Pain - part of body: Knee         Time: 1420-1456 PT Time Calculation (min) (ACUTE ONLY): 36 min   Charges:   PT Evaluation $PT Eval Moderate Complexity: 1 Procedure PT Treatments $Therapeutic Activity: 8-22 mins   PT G Codes:         Summar Mcglothlin H. Manson PasseyBrown, PT, DPT, NCS 12/28/16, 3:54 PM [phone]

## 2016-12-28 NOTE — NC FL2 (Signed)
[place] MEDICAID FL2 LEVEL OF CARE SCREENING TOOL     IDENTIFICATION  Patient Name: Molly Wolf Birthdate: 1941-02-06 Sex: female Admission Date (Current Location): 12/27/2016  [place]   Facility and Address:  [facility], [address], [place], [place] [address]      Provider Number: [id number]  Attending Physician Name and Address:  Delfino LovettVipul Shah, MD  Relative Name and Phone Number:  Freida BusmanLucas,Donna Daughter [phone]     Current Level of Care: Hospital Recommended Level of Care: Skilled Nursing Facility Prior Approval Number:    Date Approved/Denied:   PASRR Number: [id number]  Discharge Plan: SNF    Current Diagnoses: Patient Active Problem List   Diagnosis Date Noted  . Intractable pain 12/27/2016    Orientation RESPIRATION BLADDER Height & Weight     Self, Time, Situation, Place  Normal Continent Weight: 165 lb 9.6 oz (75.1 kg) Height:  5\' 7"  (170.2 cm)  BEHAVIORAL SYMPTOMS/MOOD NEUROLOGICAL BOWEL NUTRITION STATUS      Continent Diet (Carb Modified)  AMBULATORY STATUS COMMUNICATION OF NEEDS Skin   Limited Assist Verbally Normal                       Personal Care Assistance Level of Assistance  Bathing, Dressing, Feeding Bathing Assistance: Limited assistance Feeding assistance: Independent Dressing Assistance: Limited assistance     Functional Limitations Info  Hearing, Speech, Sight Sight Info: Adequate Hearing Info: Adequate Speech Info: Adequate    SPECIAL CARE FACTORS FREQUENCY  PT (By licensed PT)     PT Frequency: 5x a week              Contractures Contractures Info: Not present    Additional Factors Info  Code Status, Allergies, Psychotropic, Insulin Sliding Scale Code Status Info: DNR Allergies Info: NKA Psychotropic Info: escitalopram (LEXAPRO) tablet 20 mg LORazepam (ATIVAN) tablet 1 mg Insulin Sliding Scale Info: insulin aspart (novoLOG) injection 0-15 Units  3x a day with meals.       Current Medications (12/28/2016):  This is the current hospital active medication list Current Facility-Administered Medications  Medication Dose Route Frequency Provider Last Rate Last Dose  . acetaminophen (TYLENOL) tablet 650 mg  650 mg Oral Q6H PRN Alexis Hugelmeyer, DO       Or  . acetaminophen (TYLENOL) suppository 650 mg  650 mg Rectal Q6H PRN Alexis Hugelmeyer, DO      . albuterol (PROVENTIL) (2.5 MG/3ML) 0.083% nebulizer solution 2.5 mg  2.5 mg Nebulization Q6H PRN Alexis Hugelmeyer, DO      . atorvastatin (LIPITOR) tablet 20 mg  20 mg Oral Daily Alexis Hugelmeyer, DO      . bisacodyl (DULCOLAX) EC tablet 5 mg  5 mg Oral Daily PRN [employer] Hugelmeyer, DO      . Carbidopa-Levodopa ER (SINEMET CR) 25-100 MG tablet controlled release 2 tablet  2 tablet Oral QID Alexis Hugelmeyer, DO   2 tablet at 12/28/16 1357  . clopidogrel (PLAVIX) tablet 75 mg  75 mg Oral Daily Alexis Hugelmeyer, DO   75 mg at 12/28/16 0935  . escitalopram (LEXAPRO) tablet 20 mg  20 mg Oral Daily Alexis Hugelmeyer, DO   20 mg at 12/28/16 0936  . furosemide (LASIX) tablet 20 mg  20 mg Oral Daily Alexis Hugelmeyer, DO   20 mg at 12/28/16 0936  . gabapentin (NEURONTIN) capsule 300 mg  300 mg Oral Daily Alexis Hugelmeyer, DO   300 mg at 12/28/16 0935  .  hydrALAZINE (APRESOLINE) tablet 50 mg  50 mg Oral Q6H Vipul Shah, MD   50 mg at 12/28/16 1357  . Influenza vac split quadrivalent PF (FLUARIX) injection 0.5 mL  0.5 mL Intramuscular Tomorrow-1000 Alexis Hugelmeyer, DO      . insulin aspart (novoLOG) injection 0-15 Units  0-15 Units Subcutaneous TID WC Alexis Hugelmeyer, DO      . insulin aspart (novoLOG) injection 0-5 Units  0-5 Units Subcutaneous QHS Alexis Hugelmeyer, DO   3 Units at 12/28/16 0023  . insulin glargine (LANTUS) injection 40 Units  40 Units Subcutaneous QHS Alexis Hugelmeyer, DO   40 Units at 12/28/16 0023  . ipratropium (ATROVENT) nebulizer solution 0.5 mg  0.5 mg Nebulization Q6H  PRN Alexis Hugelmeyer, DO      . isosorbide mononitrate (IMDUR) 24 hr tablet 60 mg  60 mg Oral Daily Alexis Hugelmeyer, DO   60 mg at 12/28/16 0935  . loratadine (CLARITIN) tablet 5 mg  5 mg Oral Daily PRN [employer], DO      . LORazepam (ATIVAN) tablet 1 mg  1 mg Oral BID Alexis Hugelmeyer, DO   1 mg at 12/28/16 0935  . magnesium citrate solution 1 Bottle  1 Bottle Oral Once PRN Alexis Hugelmeyer, DO      . metoprolol tartrate (LOPRESSOR) tablet 12.5 mg  12.5 mg Oral BID Alexis Hugelmeyer, DO   12.5 mg at 12/28/16 0936  . mirabegron ER (MYRBETRIQ) tablet 25 mg  25 mg Oral Daily Alexis Hugelmeyer, DO   25 mg at 12/28/16 0936  . morphine 2 MG/ML injection 1 mg  1 mg Intravenous Q4H PRN Alexis Hugelmeyer, DO      . morphine 2 MG/ML injection 2 mg  2 mg Intravenous Q1H PRN Loleta Rose, MD   2 mg at 12/27/16 2205  . nitroGLYCERIN (NITROSTAT) SL tablet 0.4 mg  0.4 mg Sublingual Q5 min PRN Alexis Hugelmeyer, DO      . ondansetron (ZOFRAN) tablet 4 mg  4 mg Oral Q6H PRN Alexis Hugelmeyer, DO       Or  . ondansetron (ZOFRAN) injection 4 mg  4 mg Intravenous Q6H PRN Alexis Hugelmeyer, DO      . oxyCODONE (Oxy IR/ROXICODONE) immediate release tablet 5 mg  5 mg Oral Q4H PRN Alexis Hugelmeyer, DO   5 mg at 12/28/16 1357  . promethazine (PHENERGAN) tablet 25 mg  25 mg Oral Q6H PRN Alexis Hugelmeyer, DO      . saccharomyces boulardii (FLORASTOR) capsule 250 mg  250 mg Oral Daily Alexis Hugelmeyer, DO   250 mg at 12/28/16 0936  . senna-docusate (Senokot-S) tablet 1 tablet  1 tablet Oral QHS PRN Alexis Hugelmeyer, DO         Discharge Medications: Please see discharge summary for a list of discharge medications.  Relevant Imaging Results:  Relevant Lab Results:   Additional Information SSN [id number]  Darleene Cleaver, [place]

## 2016-12-28 NOTE — Progress Notes (Addendum)
[facility] [place] [facility]   PATIENT NAME: Molly Wolf    MR#:  [id number]  DATE OF BIRTH:  [birth year]  SUBJECTIVE:  CHIEF COMPLAINT:   Chief Complaint  Patient presents with  . Fall  wants to go to bathroom, not in much pain REVIEW OF SYSTEMS:  Review of Systems  Constitutional: Positive for malaise/fatigue. Negative for chills, fever and weight loss.  HENT: Negative for nosebleeds and sore throat.   Eyes: Negative for blurred vision.  Respiratory: Negative for cough, shortness of breath and wheezing.   Cardiovascular: Negative for chest pain, orthopnea, leg swelling and PND.  Gastrointestinal: Negative for abdominal pain, constipation, diarrhea, heartburn, nausea and vomiting.  Genitourinary: Negative for dysuria and urgency.  Musculoskeletal: Positive for falls and joint pain. Negative for back pain.  Skin: Negative for rash.  Neurological: Positive for weakness. Negative for dizziness, speech change, focal weakness and headaches.  Endo/Heme/Allergies: Does not bruise/bleed easily.  Psychiatric/Behavioral: Negative for depression.   DRUG ALLERGIES:  No Known Allergies VITALS:  Blood pressure (!) 175/76, pulse 92, temperature 98.8 F (37.1 C), temperature source Oral, resp. rate 18, height 5\' 7"  (1.702 m), weight 75.1 kg (165 lb 9.6 oz), SpO2 94 %. PHYSICAL EXAMINATION:  Physical Exam  Constitutional: She is oriented to person, place, and time and well-developed, well-nourished, and in no distress.  HENT:  Head: Normocephalic and atraumatic.  Eyes: Conjunctivae and EOM are normal. Pupils are equal, round, and reactive to light.  Neck: Normal range of motion. Neck supple. No tracheal deviation present. No thyromegaly present.  Cardiovascular: Normal rate, regular rhythm and normal heart sounds.   Pulmonary/Chest: Effort normal and breath sounds normal. No respiratory distress. She has no wheezes. She exhibits no tenderness.  Abdominal: Soft.  Bowel sounds are normal. She exhibits no distension. There is no tenderness.  Musculoskeletal:       Right knee: She exhibits decreased range of motion, swelling, effusion and abnormal patellar mobility.  Brace in place  Neurological: She is alert and oriented to person, place, and time. No cranial nerve deficit.  Skin: Skin is warm and dry. No rash noted.  Psychiatric: Mood and affect normal.   LABORATORY PANEL:  Female CBC  Recent Labs Lab 12/28/16 0334  WBC 6.0  HGB 9.6*  HCT 27.4*  PLT 154   ------------------------------------------------------------------------------------------------------------------ Chemistries   Recent Labs Lab 12/27/16 1910 12/28/16 0334  NA 136 137  K 4.5 4.2  CL 108 108  CO2 23 25  GLUCOSE 160* 180*  BUN 43* 46*  CREATININE 2.50* 2.39*  CALCIUM 8.8* 8.3*  AST 14*  --   ALT 5*  --   ALKPHOS 64  --   BILITOT 1.0  --    RADIOLOGY:  Ct Head Wo Contrast  Result Date: 12/27/2016 CLINICAL DATA:  Fall yesterday striking her head. History of multiple falls. EXAM: CT HEAD WITHOUT CONTRAST TECHNIQUE: Contiguous axial images were obtained from the base of the skull through the vertex without intravenous contrast. COMPARISON:  10/25/2016 FINDINGS: Brain: The brainstem, cerebellum, cerebral peduncles, thalami, basal ganglia, basilar cisterns, and ventricular system appear within normal limits. Periventricular white matter and corona radiata hypodensities favor chronic ischemic microvascular white matter disease. No intracranial hemorrhage, mass lesion, or acute CVA. Vascular: There is atherosclerotic calcification of the cavernous carotid arteries bilaterally. Atherosclerotic calcification of the vertebral arteries. Skull: Hyperostosis frontalis interna. Chronic calcifications along the dura, as on prior exams. Sinuses/Orbits: Unremarkable Other: No supplemental non-categorized findings. IMPRESSION: 1. No  acute intracranial findings. 2. Periventricular white  matter and corona radiata hypodensities favor chronic ischemic microvascular white matter disease. 3. Atherosclerosis. Electronically Signed   By: Gaylyn Rong M.D.   On: 12/27/2016 18:02   Ct Knee Right Wo Contrast  Result Date: 12/27/2016 CLINICAL DATA:  Knee pain and swelling post fall with patellar fracture. EXAM: CT OF THE RIGHT KNEE WITHOUT CONTRAST TECHNIQUE: Multidetector CT imaging of the RIGHT knee was performed according to the standard protocol. Multiplanar CT image reconstructions were also generated. COMPARISON:  Knee radiograph 12/27/2016 FINDINGS: Bones/Joint/Cartilage Generalized osteopenia. There is a transverse minimally displaced mildly comminuted fracture of the patella with associated hemorrhagic suprapatellar joint effusion. There is a 3 compartment osteoarthritis, moderate in severity of the right knee joint. No other fractures of seen. There is anterior soft tissue swelling about the knee. Ligaments Suboptimally assessed by CT. Muscles and Tendons Appear intact. Soft tissues Calcific atherosclerotic calcifications noted. IMPRESSION: Generalized osteopenia. Transverse minimally displaced mildly comminuted fracture of the patella with associated hemorrhagic suprapatellar joint effusion. 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 Electronically Signed   By: Ted Mcalpine M.D.   On: 12/27/2016 20:26   Dg Knee 3 View Right  Result Date: 12/27/2016 CLINICAL DATA:  Pain after fall yesterday EXAM: RIGHT KNEE - 3 VIEW COMPARISON:  None. FINDINGS: Lucencies project through the patella on the AP and oblique views, not obvious on the lateral view. Tricompartmental degenerative changes are seen. 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 Vascular calcifications are noted. There is a joint effusion in the suprapatellar space. IMPRESSION: Lucencies through the patella best seen on the AP and oblique view with overlying soft tissue swelling  and a joint effusion are concerning for a comminuted patellar fracture. The suspected fracture is not as well seen on the lateral view as typical. Recommend clinical correlation. Electronically Signed   By: Gerome Sam III M.D   On: 12/27/2016 17:25   ASSESSMENT AND PLAN:  This is a 76 y.o. female with a history of arthritis, anxiety, CKD, CAD, depression, DM, HLD, HTN, Parkinson's  now being admitted with:  #. Intractable pain / gait instability secondary to patellar fracture - Pain control - avoid narcotics if possible - Knee immobilizer - Ortho and PT consults pending - may need STR/SNF  # Uncontrolled HTN - likely due to uncontrolled pain - Continue metoprolol, Lasix, Imdur - Increase dose of hydralazine  #. History of HLD - Continue Lipitor  #. History of Parkinson - Continue Sinemet  #. History of CAD - Continue Plavix, Imdur  #. History of depression - Continue Lexapro, Ativan  #. History of DM - Continue Lantus - RISS achs  #. History of urinary incontinence - Continue Myrbetriq  #. History of CKD 3, near baseline -Monitor BMP  - Creat 2.39 today  #. History of Anemia of chronic dz -Repeat CBC    She lives with her grand-son close to [place]. He has HC-POA for her. Her daughter would be interested in placement if she qualifies.  They are in agreement for DNR   All the records are reviewed and case discussed with Care Management/Social Worker. Management plans discussed with the patient, family (d/w Daughter Molly Wolf via Phone) and they are in agreement.  CODE STATUS: Full Code  TOTAL TIME TAKING CARE OF THIS PATIENT: 35 minutes.   More than 50% of the time was spent in counseling/coordination of care: YES  POSSIBLE D/C IN 1-2 DAYS, DEPENDING ON CLINICAL CONDITION.  Delfino Lovett M.D on 12/28/2016 at 7:30 AM  Between 7am to 6pm - Pager - [phone]  After 6pm go to [email or web address] - Social research officer, government  [facility]    Office  [phone]  CC: Primary care physician; Darliss Ridgel, MD  Note: This dictation was prepared with Dragon dictation along with smaller phrase technology. Any transcriptional errors that result from this process are unintentional.

## 2016-12-28 NOTE — Progress Notes (Signed)
PT Cancellation Note  Patient Details Name: Molly Wolf MRN: [id number] DOB: 04-05-1941   Cancelled Treatment:    Reason Eval/Treat Not Completed: Medical issues which prohibited therapy (Consult received and chart reviewed. Patient with acute patellar fracture, comminuted/mildly displaced; pending ortho consult at this time.  Will hold mobility assessment until cleared by ortho.  Will re-attempt at later date/time.)   Tejuan Gholson H. Manson PasseyBrown, PT, DPT, NCS 12/28/16, 8:34 AM [phone]

## 2016-12-28 NOTE — Clinical Social Work Note (Signed)
Clinical Social Work Assessment  Patient Details  Name: Molly Wolf MRN: [id number] Date of Birth: June 11, 1941  Date of referral:  12/28/16               Reason for consult:  Facility Placement                Permission sought to share information with:  Family Supports, Magazine features editoracility Contact Representative Permission granted to share information::  Yes, Verbal Permission Granted  Name::     Freida BusmanLucas,Donna Daughter [phone]   Agency::  SNF admissions  Relationship::     Contact Information:     Housing/Transportation Living arrangements for the past 2 months:  Single Family Home Source of Information:  Adult Children, Patient Patient Interpreter Needed:  None Criminal Activity/Legal Involvement Pertinent to Current Situation/Hospitalization:  No - Comment as needed Significant Relationships:  Adult Children, Other Family Members Lives with:  Relatives Do you feel safe going back to the place where you live?  No Need for family participation in patient care:  Yes (Comment) (Patient requests to have her daughter involved in decision making.)  Care giving concerns: Patient and family states they feel she needs some short term rehab before she is able to return back home.   Social Worker assessment / plan:  Patient is a 76 year old female who lives with her granddaughter and grandson, patient is alert and oriented x4 and able to express her feelings.  Patient states she has been to rehab before but she could not remember where she was in the past.  CSW spoke to patient's [place] who said it [place] [facility] [place] and they would prefer a SNF in [place], preferably [facility].  CSW explained to patient and her daughter what to expect at SNF and the process for finding placement.  Patient was explained how insurance will pay for her stay at SNF.  Patient and her daughter did not express any other questions or concerns.  They gave CSW permission to begin bed search process in [place].     Employment status:  Retired Database administratornsurance information:  Managed Medicare PT Recommendations:  Skilled Nursing Facility Information / Referral to community resources:  Skilled Nursing Facility  Patient/Family's Response to care:  Patient and family agreeable to going to SNF for short term rehab.  Patient/Family's Understanding of and Emotional Response to Diagnosis, Current Treatment, and Prognosis:  Patient and family expressed they are hopeful patient will not [facility].  Emotional Assessment Appearance:  Appears stated age Attitude/Demeanor/Rapport:    Affect (typically observed):  Appropriate, Calm, Pleasant Orientation:  Oriented to  Time, Oriented to Place, Oriented to Self, Oriented to Situation Alcohol / Substance use:  Not Applicable Psych involvement (Current and /or in the community):  No (Comment)  Discharge Needs  Concerns to be addressed:  Lack of Support Readmission within the last 30 days:  No Current discharge risk:  None Barriers to Discharge:  Continued Medical Work up   [place] [employer], Kristle Wesch R, LCSWA 12/28/2016, 5:09 PM

## 2016-12-29 LAB — GLUCOSE, CAPILLARY
GLUCOSE-CAPILLARY: 85 mg/dL (ref 65–99)
Glucose-Capillary: 171 mg/dL — ABNORMAL HIGH (ref 65–99)

## 2016-12-29 MED ORDER — TRAMADOL HCL 50 MG PO TABS: 50.0000 mg | ORAL_TABLET | Freq: Every day | ORAL | 0 refills | Status: DC | PRN

## 2016-12-29 MED ORDER — LORAZEPAM 1 MG PO TABS: 1.0000 mg | ORAL_TABLET | Freq: Two times a day (BID) | ORAL | 0 refills | Status: DC | PRN

## 2016-12-29 NOTE — Discharge Instructions (Signed)
Patellar Fracture, Adult A patellar fracture is a break in the kneecap (patella). The patella is located on the front of the knee. A patellar fracture may make it difficult to walk. What are the causes? This condition may be caused by:  A fall or a hard, direct hit (blow) to the knee.  A very hard and strong bending of the knee. What increases the risk? The following factors make you more likely to experience a patellar fracture:  Playing contact sports or motor sports, especially sports that involve a lot of jumping.  Having bone abnormalities or diseases of the bone, such as osteoporosis or a bone tumor.  Having poor strength and flexibility.  Having metabolism disorders, hormone problems, or nutrition deficiencies and disorders, such as anorexia or bulimia. What are the signs or symptoms? Symptoms of this condition include:  Tender and swollen knee.  Pain when moving the knee, especially when straightening it.  Difficulty walking or using the knee to support body weight (bearing weight).  Misshapen knee, as if a bone is out of place. How is this diagnosed? This condition is diagnosed based on:  Your symptoms and medical history.  A physical exam.  X-rays. How is this treated? Treatment for this condition depends on the type of fracture that you have:  If your patella is still in the right position after the fracture and you can still straighten your leg, you may need to wear a splint or cast for 4-6 weeks.  If your patella is broken into multiple pieces but you are able to straighten your leg, you can usually be treated with a splint or cast for 4-6 weeks. In some cases, the patella may need to be removed before the cast is applied.  If you cannot straighten your leg after a patellar fracture, you will need to have surgery to hold the patella together until it heals. After surgery, a splint or cast will be applied for 4-6 weeks.  You may be prescribed medicine to help  relieve pain or prevent infection. Follow these instructions at home: If you have a splint:   Wear the splint as told by your health care provider. Remove it only as told by your health care provider.  Loosen the splint if your toes tingle, become numb, or turn cold and blue.  Keep the splint clean.  If the splint is not waterproof:  Do not let it get wet.  Cover it with a watertight covering when you take a bath or a shower. If you have a cast:   Do not stick anything inside the cast to scratch your skin. Doing that increases your risk of infection.  Check the skin around the cast every day. Tell your health care provider about any concerns.  You may put lotion on dry skin around the edges of the cast. Do not put lotion on the skin underneath the cast.  Keep the cast clean.  If the cast is not waterproof:  Do not let it get wet.  Cover it with a watertight covering when you take a bath or a shower. Managing pain, stiffness, and swelling   If directed, put ice on the injured area:  If you have a removable splint, remove it as told by your health care provider.  Put ice in a plastic bag.  Place a towel between your skin and the bag or between your cast and the bag.  Leave the ice on for 20 minutes, 2-3 times a day.  Move  your toes often to avoid stiffness and to lessen swelling.  Raise (elevate) the injured area above the level of your heart while you are sitting or lying down. Medicines   Take over-the-counter and prescription medicines only as told by your health care provider.  If you were prescribed an antibiotic medicine, use it as told by your health care provider. Do not stop taking the antibiotic even if you start to feel better. Activity   Return to your normal activities as told by your health care provider. Ask your health care provider what activities are safe for you.  Do exercises as told by your health care provider.  Ask your health care provider  when it is safe to drive if you have a splint or cast on your leg.  Do not drive or use heavy machinery while taking prescription pain medicine. General instructions   Do not use the injured limb to support your body weight until your health care provider says that you can. Use crutches as told by your health care provider.  Do not use any products that contain nicotine or tobacco, such as cigarettes and e-cigarettes. These can delay bone healing. If you need help quitting, ask your health care provider.  Keep all follow-up visits as told by your health care provider. This is important. Contact a health care provider if:  You have symptoms that get worse or do not get better after 2 weeks of treatment.  You have severe, persistent pain. Get help right away if:  You have redness, swelling, or increasing pain in your knee.  You have a fever.  You have blue or gray skin below the fracture site or in the toes.  You have numbness or loss of feeling below the fracture site. This information is not intended to replace advice given to you by your health care provider. Make sure you discuss any questions you have with your health care provider. Document Released: 06/27/2003 Document Revised: 07/10/2016 Document Reviewed: 07/10/2016 Elsevier Interactive Patient Education  2017 [employer]

## 2016-12-29 NOTE — [place] FL2 (Signed)
[place] MEDICAID FL2 LEVEL OF CARE SCREENING TOOL     IDENTIFICATION  Patient Name: Molly Wolf Birthdate: 17-Jan-1941 Sex: female Admission Date (Current Location): 12/27/2016  [place]   Facility and Address:  [facility], [address], [place], [place] [address]      Provider Number: [id number]  Attending Physician Name and Address:  Delfino LovettVipul Shah, MD  Relative Name and Phone Number:  Freida BusmanLucas,Donna Daughter [phone]     Current Level of Care: Hospital Recommended Level of Care: Skilled Nursing Facility Prior Approval Number:    Date Approved/Denied:   PASRR Number: [id number]  Discharge Plan: SNF    Current Diagnoses: Patient Active Problem List   Diagnosis Date Noted  . Intractable pain 12/27/2016    Orientation RESPIRATION BLADDER Height & Weight     Self, Time, Situation, Place  Normal Continent Weight: 165 lb 9.6 oz (75.1 kg) Height:  5\' 7"  (170.2 cm)  BEHAVIORAL SYMPTOMS/MOOD NEUROLOGICAL BOWEL NUTRITION STATUS      Continent Diet (Carb Modified)  AMBULATORY STATUS COMMUNICATION OF NEEDS Skin   Limited Assist Verbally Normal                       Personal Care Assistance Level of Assistance  Bathing, Dressing, Feeding Bathing Assistance: Limited assistance Feeding assistance: Independent Dressing Assistance: Limited assistance     Functional Limitations Info  Hearing, Speech, Sight Sight Info: Adequate Hearing Info: Adequate Speech Info: Adequate    SPECIAL CARE FACTORS FREQUENCY  PT (By licensed PT)     PT Frequency: 5x a week              Contractures Contractures Info: Not present    Additional Factors Info  Code Status, Allergies, Psychotropic, Insulin Sliding Scale Code Status Info: DNR Allergies Info: NKA Psychotropic Info: escitalopram (LEXAPRO) tablet 20 mg LORazepam (ATIVAN) tablet 1 mg Insulin Sliding Scale Info: insulin aspart (novoLOG) injection 0-15 Units  3x a day with meals.       Current Medications (12/29/2016):  This is the current hospital active medication list Current Facility-Administered Medications  Medication Dose Route Frequency Provider Last Rate Last Dose  . acetaminophen (TYLENOL) tablet 650 mg  650 mg Oral Q6H PRN Alexis Hugelmeyer, DO       Or  . acetaminophen (TYLENOL) suppository 650 mg  650 mg Rectal Q6H PRN Alexis Hugelmeyer, DO      . albuterol (PROVENTIL) (2.5 MG/3ML) 0.083% nebulizer solution 2.5 mg  2.5 mg Nebulization Q6H PRN Alexis Hugelmeyer, DO      . atorvastatin (LIPITOR) tablet 20 mg  20 mg Oral Daily Alexis Hugelmeyer, DO   20 mg at 12/28/16 1741  . bisacodyl (DULCOLAX) EC tablet 5 mg  5 mg Oral Daily PRN [employer] Hugelmeyer, DO      . Carbidopa-Levodopa ER (SINEMET CR) 25-100 MG tablet controlled release 2 tablet  2 tablet Oral QID Alexis Hugelmeyer, DO   2 tablet at 12/28/16 2200  . clopidogrel (PLAVIX) tablet 75 mg  75 mg Oral Daily Alexis Hugelmeyer, DO   75 mg at 12/28/16 0935  . escitalopram (LEXAPRO) tablet 20 mg  20 mg Oral Daily Alexis Hugelmeyer, DO   20 mg at 12/28/16 0936  . furosemide (LASIX) tablet 20 mg  20 mg Oral Daily Alexis Hugelmeyer, DO   20 mg at 12/28/16 0936  . gabapentin (NEURONTIN) capsule 300 mg  300 mg Oral Daily Alexis Hugelmeyer, DO   300 mg at  12/28/16 0935  . hydrALAZINE (APRESOLINE) tablet 50 mg  50 mg Oral Q6H Vipul Shah, MD   50 mg at 12/29/16 0549  . Influenza vac split quadrivalent PF (FLUARIX) injection 0.5 mL  0.5 mL Intramuscular Tomorrow-1000 Alexis Hugelmeyer, DO      . insulin aspart (novoLOG) injection 0-15 Units  0-15 Units Subcutaneous TID WC Alexis Hugelmeyer, DO      . insulin aspart (novoLOG) injection 0-5 Units  0-5 Units Subcutaneous QHS Alexis Hugelmeyer, DO   3 Units at 12/28/16 2200  . insulin glargine (LANTUS) injection 40 Units  40 Units Subcutaneous QHS Alexis Hugelmeyer, DO   40 Units at 12/28/16 0023  . ipratropium (ATROVENT) nebulizer solution 0.5 mg  0.5  mg Nebulization Q6H PRN Alexis Hugelmeyer, DO      . isosorbide mononitrate (IMDUR) 24 hr tablet 60 mg  60 mg Oral Daily Alexis Hugelmeyer, DO   60 mg at 12/28/16 0935  . loratadine (CLARITIN) tablet 5 mg  5 mg Oral Daily PRN [employer], DO      . LORazepam (ATIVAN) tablet 1 mg  1 mg Oral BID Alexis Hugelmeyer, DO   1 mg at 12/28/16 2200  . magnesium citrate solution 1 Bottle  1 Bottle Oral Once PRN Alexis Hugelmeyer, DO      . metoprolol tartrate (LOPRESSOR) tablet 12.5 mg  12.5 mg Oral BID Alexis Hugelmeyer, DO   12.5 mg at 12/28/16 2200  . mirabegron ER (MYRBETRIQ) tablet 25 mg  25 mg Oral Daily Alexis Hugelmeyer, DO   25 mg at 12/28/16 0936  . morphine 2 MG/ML injection 1 mg  1 mg Intravenous Q4H PRN Alexis Hugelmeyer, DO      . morphine 2 MG/ML injection 2 mg  2 mg Intravenous Q1H PRN Loleta Rose, MD   2 mg at 12/27/16 2205  . nitroGLYCERIN (NITROSTAT) SL tablet 0.4 mg  0.4 mg Sublingual Q5 min PRN Alexis Hugelmeyer, DO      . ondansetron (ZOFRAN) tablet 4 mg  4 mg Oral Q6H PRN Alexis Hugelmeyer, DO       Or  . ondansetron (ZOFRAN) injection 4 mg  4 mg Intravenous Q6H PRN Alexis Hugelmeyer, DO      . oxyCODONE (Oxy IR/ROXICODONE) immediate release tablet 5 mg  5 mg Oral Q4H PRN Alexis Hugelmeyer, DO   5 mg at 12/29/16 0549  . promethazine (PHENERGAN) tablet 25 mg  25 mg Oral Q6H PRN Alexis Hugelmeyer, DO      . saccharomyces boulardii (FLORASTOR) capsule 250 mg  250 mg Oral Daily Alexis Hugelmeyer, DO   250 mg at 12/28/16 0936  . senna-docusate (Senokot-S) tablet 1 tablet  1 tablet Oral QHS PRN Alexis Hugelmeyer, DO         Discharge Medications: Please see discharge summary for a list of discharge medications.  Relevant Imaging Results:  Relevant Lab Results:   Additional Information SSN [id number]  Jaydynn Wolford, Darleen Crocker, LCSW

## 2016-12-29 NOTE — Clinical Social Work Placement (Signed)
   CLINICAL SOCIAL WORK PLACEMENT  NOTE  Date:  12/29/2016  Patient Details  Name: Molly CunasJudy Cradle MRN: [id number] Date of Birth: 02/05/1941  Clinical Social Work is seeking post-discharge placement for this patient at the Skilled  Nursing Facility level of care (*CSW will initial, date and re-position this form in  chart as items are completed):  Yes   Patient/family provided with [facility] Clinical Social Work Department's list of facilities offering this level of care within the geographic area requested by the patient (or if unable, by the patient's family).  Yes   Patient/family informed of their freedom to choose among providers that offer the needed level of care, that participate in Medicare, Medicaid or managed care program needed by the patient, have an available bed and are willing to accept the patient.  Yes   Patient/family informed of [facility]'s ownership interest in [facility] and [facility], as well as of the fact that they are under no obligation to receive care at these facilities.  PASRR submitted to EDS on 12/28/16     PASRR number received on       Existing PASRR number confirmed on 12/28/16     FL2 transmitted to all facilities in geographic area requested by pt/family on 12/28/16     FL2 transmitted to all facilities within larger geographic area on       Patient informed that his/her managed care company has contracts with or will negotiate with certain facilities, including the following:        Yes   Patient/family informed of bed offers received.  Patient chooses bed at  [facility])     Physician recommends and patient chooses bed at      Patient to be transferred to  [facility]) on 12/29/16.  Patient to be transferred to facility by  [facility])     Patient family notified on 12/29/16 of transfer.  Name of family member notified:   (Patients daughter [name]. )     PHYSICIAN Please sign FL2, Please sign  DNR     Additional Comment:    _______________________________________________ Ralene BatheMackenzie Myha Arizpe, Student-Social Work 12/29/2016, 10:00 AM

## 2016-12-29 NOTE — Progress Notes (Signed)
Patient is medically stable for discharge today to [facility]. Per Magda Paganini, admissions coordinator at [facility], patient can come to room 505. Social work Patent examiner discharge orders in [place]. Social work Theatre manager met with patient at bedside and explained that patient will discharge today to [facility]. Patient verbally agreed she understood. CSW spoke to patient's daughter [name] making her aware of patients discharge today. RN will call report and arrange EMS for transport. Please re-consult if future social work need arise. Social work Architectural technologist off.   Danie Chandler, Social Work Intern  [phone]

## 2016-12-29 NOTE — Discharge Summary (Addendum)
[facility] [place] NAME: Molly Wolf    MR#:  [id number]  DATE OF BIRTH:  [birth year]  DATE OF ADMISSION:  12/27/2016   ADMITTING PHYSICIAN: Harvie Bridge, DO  DATE OF DISCHARGE: 12/29/2016  PRIMARY CARE PHYSICIAN: Arlyce Harman, Luther Redo, MD   ADMISSION DIAGNOSIS:  Acute on chronic renal insufficiency [N28.9, N18.9] Intractable pain [R52] Closed nondisplaced transverse fracture of right patella, initial encounter [S82.034A] Effusion of bursa of right knee [M25.461] Acute pain of right knee [M25.561] Parkinsonism, unspecified Parkinsonism type ([place]) [G20] DISCHARGE DIAGNOSIS:  Active Problems:   Intractable pain  SECONDARY DIAGNOSIS:   Past Medical History:  Diagnosis Date  . Anxiety   . Arthritis   . Chronic kidney disease   . Coronary artery disease   . Depression   . Diabetes mellitus without complication ([place])   . Hyperlipidemia   . Hypertension   . Parkinson disease ([place])   . Renal disorder    HOSPITAL COURSE:  This is a 76 y.o.femalewith a history of arthritis, anxiety, CKD, CAD, depression, DM, HLD, HTN, Parkinson'snow being admitted with:  #. Intractable pain / gait instability secondary to patellar fracture - somewhat stellate without much displacement. She has weakness to her quads she is able flex extend her toes. X-ray show a comminuted fracture with 3 large fragments but almost no displacement. Extensor mechanism should be intact. Ortho Recommendation is to weight-bear as tolerated with knee in extension of follow-up will be in about 2 weeks if there is no displacement that point she'll require ORIF if not hopefully get her into a better brace up to 8 weeks of immobilization.  # Uncontrolled HTN - likely due to uncontrolled pain - Continue metoprolol, Lasix, Imdur - Increased dose of hydralazine  #. History of Parkinson - Continue Sinemet  #. History of CAD - Continue Plavix, Imdur  #. History  of urinary incontinence - Continue Myrbetriq  #. History of CKD 3, near baseline  #. History of Anemia of chronic dz -stable DISCHARGE CONDITIONS:  stable CONSULTS OBTAINED:  Treatment Team:  Hessie Knows, MD DRUG ALLERGIES:  No Known Allergies DISCHARGE MEDICATIONS:   Allergies as of 12/29/2016   No Known Allergies     Medication List    STOP taking these medications   cephALEXin 250 MG capsule Commonly known as:  KEFLEX     TAKE these medications   acetaminophen 325 MG tablet Commonly known as:  TYLENOL Take 650 mg by mouth every 6 (six) hours as needed for mild pain.   atorvastatin 20 MG tablet Commonly known as:  LIPITOR Take 20 mg by mouth daily.   blood glucose meter kit and supplies Kit Dispense based on patient and insurance preference. Use up to four times daily as directed. (FOR ICD-9 250.00, 250.01).   Carbidopa-Levodopa ER 25-100 MG tablet controlled release Commonly known as:  SINEMET CR Take 2 tablets by mouth 4 (four) times daily.   clopidogrel 75 MG tablet Commonly known as:  PLAVIX Take 75 mg by mouth daily.   CoQ10 50 MG Caps Take 50 mg by mouth daily.   CRANBERRY PO Take 1 tablet by mouth daily.   escitalopram 20 MG tablet Commonly known as:  LEXAPRO Take 20 mg by mouth daily.   Fish Oil 1000 MG Caps Take 1,000 mg by mouth daily.   furosemide 20 MG tablet Commonly known as:  LASIX Take 20 mg by mouth daily.   gabapentin 300 MG capsule Commonly known  as:  NEURONTIN Take 300 mg by mouth daily.   hydrALAZINE 50 MG tablet Commonly known as:  APRESOLINE Take 50 mg by mouth 2 (two) times daily.   insulin glargine 100 UNIT/ML injection Commonly known as:  LANTUS Inject 40 Units into the skin at bedtime.   insulin lispro 100 UNIT/ML injection Commonly known as:  HUMALOG Inject 6 Units into the skin 3 (three) times daily with meals.   isosorbide mononitrate 60 MG 24 hr tablet Commonly known as:  IMDUR Take 60 mg by mouth  daily.   loratadine 10 MG tablet Commonly known as:  CLARITIN Take 5 mg by mouth daily as needed for allergies.   LORazepam 1 MG tablet Commonly known as:  ATIVAN Take 1 tablet (1 mg total) by mouth 2 (two) times daily as needed for anxiety. What changed:  when to take this  reasons to take this   metoprolol tartrate 25 MG tablet Commonly known as:  LOPRESSOR Take 12.5 mg by mouth 2 (two) times daily.   MYRBETRIQ 25 MG Tb24 tablet Generic drug:  mirabegron ER Take 25 mg by mouth daily.   nitroGLYCERIN 0.4 MG SL tablet Commonly known as:  NITROSTAT Place 0.4 mg under the tongue every 5 (five) minutes as needed for chest pain.   polyethylene glycol packet Commonly known as:  MIRALAX / GLYCOLAX Take 17 g by mouth daily as needed for mild constipation.   promethazine 25 MG tablet Commonly known as:  PHENERGAN Take 25 mg by mouth every 6 (six) hours as needed for nausea or vomiting.   saccharomyces boulardii 250 MG capsule Commonly known as:  FLORASTOR Take 250 mg by mouth daily.   traMADol 50 MG tablet Commonly known as:  ULTRAM Take 1 tablet (50 mg total) by mouth daily as needed.      DISCHARGE INSTRUCTIONS:  weight-bear as tolerated with knee in extension DIET:  Regular diet DISCHARGE CONDITION:  Good ACTIVITY:  Activity as tolerated OXYGEN:  Home Oxygen: No.  Oxygen Delivery: room air DISCHARGE LOCATION:  nursing home - Palliative care eval while at the facility  If you experience worsening of your admission symptoms, develop shortness of breath, life threatening emergency, suicidal or homicidal thoughts you must seek medical attention immediately by calling 911 or calling your MD immediately  if symptoms less severe.  You Must read complete instructions/literature along with all the possible adverse reactions/side effects for all the Medicines you take and that have been prescribed to you. Take any new Medicines after you have completely understood and  accpet all the possible adverse reactions/side effects.   Please note  You were cared for by a hospitalist during your hospital stay. If you have any questions about your discharge medications or the care you received while you were in the hospital after you are discharged, you can call the unit and asked to speak with the hospitalist on call if the hospitalist that took care of you is not available. Once you are discharged, your primary care physician will handle any further medical issues. Please note that NO REFILLS for any discharge medications will be authorized once you are discharged, as it is imperative that you return to your primary care physician (or establish a relationship with a primary care physician if you do not have one) for your aftercare needs so that they can reassess your need for medications and monitor your lab values.    On the day of Discharge:  VITAL SIGNS:  Blood pressure (!) 161/75,  pulse 62, temperature 98 F (36.7 C), temperature source Oral, resp. rate 18, height 5' 7"  (1.702 m), weight 75.1 kg (165 lb 9.6 oz), SpO2 94 %. PHYSICAL EXAMINATION:  GENERAL:  76 y.o.-year-old patient lying in the bed with no acute distress.  EYES: Pupils equal, round, reactive to light and accommodation. No scleral icterus. Extraocular muscles intact.  HEENT: Head atraumatic, normocephalic. Oropharynx and nasopharynx clear.  NECK:  Supple, no jugular venous distention. No thyroid enlargement, no tenderness.  LUNGS: Normal breath sounds bilaterally, no wheezing, rales,rhonchi or crepitation. No use of accessory muscles of respiration.  CARDIOVASCULAR: S1, S2 normal. No murmurs, rubs, or gallops.  ABDOMEN: Soft, non-tender, non-distended. Bowel sounds present. No organomegaly or mass.  EXTREMITIES: No pedal edema, cyanosis, or clubbing. Rt Knee in Brace - She exhibits decreased range of motion, swelling, effusion and abnormal patellar mobility. Brace in place  NEUROLOGIC: Cranial nerves  II through XII are intact. Muscle strength 5/5 in all extremities. Sensation intact. Gait not checked. Tremors from Parkinson's dz PSYCHIATRIC: The patient is alert and oriented x 3.  SKIN: No obvious rash, lesion, or ulcer.  DATA REVIEW:   CBC  Recent Labs Lab 12/28/16 0334  WBC 6.0  HGB 9.6*  HCT 27.4*  PLT 154    Chemistries   Recent Labs Lab 12/27/16 1910 12/28/16 0334  NA 136 137  K 4.5 4.2  CL 108 108  CO2 23 25  GLUCOSE 160* 180*  BUN 43* 46*  CREATININE 2.50* 2.39*  CALCIUM 8.8* 8.3*  AST 14*  --   ALT 5*  --   ALKPHOS 64  --   BILITOT 1.0  --      Contact information for follow-up providers    GAINES, THOMAS CHRISTOPHER, PA-C. Schedule an appointment as soon as possible for a visit on 01/01/2017.   Specialties:  Orthopedic Surgery, Emergency Medicine Why:  for a follow up appointment  @ 10:00 am Contact information: [place] [address] [phone]        Suann Larry, MD. Schedule an appointment as soon as possible for a visit on 12/30/2016.   Why:  @ 2:00 pm; [address], [place], [place] [phone]) Contact information: [place] [address] [phone]            Contact information for after-discharge care    [facility] .   Specialty:  [place] information: [place] [phone]                    Management plans discussed with the patient, family and they are in agreement.  CODE STATUS: DNR   TOTAL TIME TAKING CARE OF THIS PATIENT: 45 minutes.    Max Sane M.D on 12/29/2016 at 10:04 AM  Between 7am to 6pm - Pager - [phone]  After 6pm go to [email or web address] - Proofreader  [facility]  Office  [phone]  CC: Primary care physician; Suann Larry, MD   Note: This dictation was prepared with Dragon  dictation along with smaller phrase technology. Any transcriptional errors that result from this process are unintentional.

## 2016-12-29 NOTE — Discharge Planning (Signed)
Patient IV removed.  Discharge papers given, explained and educated.  RN assessment and VS revealed stability for DC to [facility]. [facility] and s/w Janann AugustVictoria Coleman, RN.  Packet prepped and ready to take to facility by EMS.  Informed of FU appts and appts made.  Paper scripts x2  In chart and both signed by Dr.  Meryle ReadyFamily aware of discharge plan and EMS contacted to transport.  Awaiting arrival of EMS.

## 2017-01-26 ENCOUNTER — Emergency Department: Payer: Medicare Other

## 2017-01-26 ENCOUNTER — Inpatient Hospital Stay (HOSPITAL_COMMUNITY)
Admit: 2017-01-26 | Discharge: 2017-01-26 | Disposition: A | Discharge status: Home / Self Care | Payer: Medicare Other | Attending: Emergency Medicine | Admitting: Emergency Medicine

## 2017-01-26 ENCOUNTER — Inpatient Hospital Stay: Payer: Medicare Other

## 2017-01-26 ENCOUNTER — Encounter: Payer: Self-pay | Admitting: Emergency Medicine

## 2017-01-26 ENCOUNTER — Inpatient Hospital Stay
Admission: EM | Admit: 2017-01-26 | Discharge: 2017-01-29 | DRG: 291 | Disposition: A | Discharge status: Home Health | Payer: Medicare Other | Attending: Specialist | Admitting: Specialist

## 2017-01-26 DIAGNOSIS — N179 Acute kidney failure, unspecified: Secondary | ICD-10-CM | POA: Diagnosis not present

## 2017-01-26 DIAGNOSIS — Z825 Family history of asthma and other chronic lower respiratory diseases: Secondary | ICD-10-CM

## 2017-01-26 DIAGNOSIS — N184 Chronic kidney disease, stage 4 (severe): Secondary | ICD-10-CM | POA: Diagnosis present

## 2017-01-26 DIAGNOSIS — J449 Chronic obstructive pulmonary disease, unspecified: Secondary | ICD-10-CM | POA: Diagnosis present

## 2017-01-26 DIAGNOSIS — I214 Non-ST elevation (NSTEMI) myocardial infarction: Secondary | ICD-10-CM

## 2017-01-26 DIAGNOSIS — I4892 Unspecified atrial flutter: Secondary | ICD-10-CM | POA: Diagnosis present

## 2017-01-26 DIAGNOSIS — I350 Nonrheumatic aortic (valve) stenosis: Secondary | ICD-10-CM | POA: Diagnosis present

## 2017-01-26 DIAGNOSIS — I2510 Atherosclerotic heart disease of native coronary artery without angina pectoris: Secondary | ICD-10-CM | POA: Diagnosis present

## 2017-01-26 DIAGNOSIS — S82001D Unspecified fracture of right patella, subsequent encounter for closed fracture with routine healing: Secondary | ICD-10-CM | POA: Diagnosis not present

## 2017-01-26 DIAGNOSIS — I82441 Acute embolism and thrombosis of right tibial vein: Secondary | ICD-10-CM | POA: Diagnosis present

## 2017-01-26 DIAGNOSIS — D649 Anemia, unspecified: Secondary | ICD-10-CM | POA: Diagnosis present

## 2017-01-26 DIAGNOSIS — I5043 Acute on chronic combined systolic (congestive) and diastolic (congestive) heart failure: Secondary | ICD-10-CM | POA: Diagnosis present

## 2017-01-26 DIAGNOSIS — I248 Other forms of acute ischemic heart disease: Secondary | ICD-10-CM | POA: Diagnosis present

## 2017-01-26 DIAGNOSIS — E1122 Type 2 diabetes mellitus with diabetic chronic kidney disease: Secondary | ICD-10-CM | POA: Diagnosis present

## 2017-01-26 DIAGNOSIS — I82409 Acute embolism and thrombosis of unspecified deep veins of unspecified lower extremity: Secondary | ICD-10-CM | POA: Diagnosis not present

## 2017-01-26 DIAGNOSIS — J9621 Acute and chronic respiratory failure with hypoxia: Secondary | ICD-10-CM | POA: Diagnosis present

## 2017-01-26 DIAGNOSIS — K5900 Constipation, unspecified: Secondary | ICD-10-CM | POA: Diagnosis present

## 2017-01-26 DIAGNOSIS — I472 Ventricular tachycardia, unspecified: Secondary | ICD-10-CM

## 2017-01-26 DIAGNOSIS — Z841 Family history of disorders of kidney and ureter: Secondary | ICD-10-CM

## 2017-01-26 DIAGNOSIS — Z955 Presence of coronary angioplasty implant and graft: Secondary | ICD-10-CM

## 2017-01-26 DIAGNOSIS — I5031 Acute diastolic (congestive) heart failure: Secondary | ICD-10-CM | POA: Diagnosis not present

## 2017-01-26 DIAGNOSIS — I509 Heart failure, unspecified: Secondary | ICD-10-CM

## 2017-01-26 DIAGNOSIS — Z79899 Other long term (current) drug therapy: Secondary | ICD-10-CM | POA: Diagnosis not present

## 2017-01-26 DIAGNOSIS — N189 Chronic kidney disease, unspecified: Secondary | ICD-10-CM | POA: Diagnosis not present

## 2017-01-26 DIAGNOSIS — I130 Hypertensive heart and chronic kidney disease with heart failure and stage 1 through stage 4 chronic kidney disease, or unspecified chronic kidney disease: Secondary | ICD-10-CM | POA: Diagnosis present

## 2017-01-26 DIAGNOSIS — G20 Parkinson's disease: Secondary | ICD-10-CM | POA: Diagnosis present

## 2017-01-26 DIAGNOSIS — J9601 Acute respiratory failure with hypoxia: Secondary | ICD-10-CM | POA: Diagnosis present

## 2017-01-26 DIAGNOSIS — I2699 Other pulmonary embolism without acute cor pulmonale: Secondary | ICD-10-CM | POA: Diagnosis not present

## 2017-01-26 DIAGNOSIS — R0602 Shortness of breath: Secondary | ICD-10-CM | POA: Diagnosis present

## 2017-01-26 DIAGNOSIS — F329 Major depressive disorder, single episode, unspecified: Secondary | ICD-10-CM | POA: Diagnosis present

## 2017-01-26 DIAGNOSIS — E785 Hyperlipidemia, unspecified: Secondary | ICD-10-CM | POA: Diagnosis present

## 2017-01-26 DIAGNOSIS — Z794 Long term (current) use of insulin: Secondary | ICD-10-CM | POA: Diagnosis not present

## 2017-01-26 DIAGNOSIS — F419 Anxiety disorder, unspecified: Secondary | ICD-10-CM | POA: Diagnosis present

## 2017-01-26 DIAGNOSIS — I82401 Acute embolism and thrombosis of unspecified deep veins of right lower extremity: Secondary | ICD-10-CM | POA: Diagnosis not present

## 2017-01-26 DIAGNOSIS — I160 Hypertensive urgency: Secondary | ICD-10-CM | POA: Diagnosis present

## 2017-01-26 DIAGNOSIS — Z7902 Long term (current) use of antithrombotics/antiplatelets: Secondary | ICD-10-CM | POA: Diagnosis not present

## 2017-01-26 DIAGNOSIS — Z8249 Family history of ischemic heart disease and other diseases of the circulatory system: Secondary | ICD-10-CM

## 2017-01-26 DIAGNOSIS — J9600 Acute respiratory failure, unspecified whether with hypoxia or hypercapnia: Secondary | ICD-10-CM | POA: Diagnosis present

## 2017-01-26 DIAGNOSIS — E119 Type 2 diabetes mellitus without complications: Secondary | ICD-10-CM

## 2017-01-26 HISTORY — DX: Other fracture of right patella, initial encounter for closed fracture: S82.091A

## 2017-01-26 HISTORY — DX: Chronic kidney disease, stage 4 (severe): N18.4

## 2017-01-26 HISTORY — DX: Chronic obstructive pulmonary disease, unspecified: J44.9

## 2017-01-26 LAB — BASIC METABOLIC PANEL
Anion gap: 13 (ref 5–15)
BUN: 35 mg/dL — AB (ref 6–20)
CHLORIDE: 103 mmol/L (ref 101–111)
CO2: 21 mmol/L — AB (ref 22–32)
CREATININE: 2.51 mg/dL — AB (ref 0.44–1.00)
Calcium: 8.4 mg/dL — ABNORMAL LOW (ref 8.9–10.3)
GFR calc non Af Amer: 17 mL/min — ABNORMAL LOW (ref >60)
GFR, EST AFRICAN AMERICAN: 20 mL/min — AB (ref >60)
Glucose, Bld: 276 mg/dL — ABNORMAL HIGH (ref 65–99)
POTASSIUM: 4.8 mmol/L (ref 3.5–5.1)
SODIUM: 137 mmol/L (ref 135–145)

## 2017-01-26 LAB — CBC
HEMATOCRIT: 30.2 % — AB (ref 35.0–47.0)
Hemoglobin: 10.1 g/dL — ABNORMAL LOW (ref 12.0–16.0)
MCH: 29.3 pg (ref 26.0–34.0)
MCHC: 33.6 g/dL (ref 32.0–36.0)
MCV: 87.3 fL (ref 80.0–100.0)
Platelets: 144 10*3/uL — ABNORMAL LOW (ref 150–440)
RBC: 3.46 MIL/uL — AB (ref 3.80–5.20)
RDW: 13.7 % (ref 11.5–14.5)
WBC: 6 10*3/uL (ref 3.6–11.0)

## 2017-01-26 LAB — GLUCOSE, CAPILLARY
GLUCOSE-CAPILLARY: 251 mg/dL — AB (ref 65–99)
GLUCOSE-CAPILLARY: 229 mg/dL — AB (ref 65–99)

## 2017-01-26 LAB — TROPONIN I
TROPONIN I: 2.26 ng/mL — AB (ref <0.03)
Troponin I: 0.94 ng/mL (ref <0.03)
Troponin I: 2.61 ng/mL (ref <0.03)

## 2017-01-26 LAB — APTT: aPTT: 30 seconds (ref 24–36)

## 2017-01-26 LAB — PROTIME-INR
INR: 1.15
Prothrombin Time: 14.8 seconds (ref 11.4–15.2)

## 2017-01-26 LAB — ECHOCARDIOGRAM COMPLETE
HEIGHTINCHES: 66 in
Weight: 2640 oz

## 2017-01-26 LAB — HEPARIN LEVEL (UNFRACTIONATED): HEPARIN UNFRACTIONATED: 0.49 [IU]/mL (ref 0.30–0.70)

## 2017-01-26 LAB — MAGNESIUM: Magnesium: 1.7 mg/dL (ref 1.7–2.4)

## 2017-01-26 LAB — BRAIN NATRIURETIC PEPTIDE: B Natriuretic Peptide: 2049 pg/mL — ABNORMAL HIGH (ref 0.0–100.0)

## 2017-01-26 MED ORDER — POLYETHYLENE GLYCOL 3350 17 G PO PACK
17.0000 g | PACK | Freq: Every day | ORAL | Status: DC | PRN
  Administered 2017-01-27 – 2017-01-29 (×2): 17 g via ORAL
  Filled 2017-01-26 (×2): qty 1

## 2017-01-26 MED ORDER — TECHNETIUM TO 99M ALBUMIN AGGREGATED
3.5060 | Freq: Once | INTRAVENOUS | Status: AC | PRN
  Administered 2017-01-26: 3.506 via INTRAVENOUS

## 2017-01-26 MED ORDER — TECHNETIUM TC 99M DIETHYLENETRIAME-PENTAACETIC ACID
32.1870 | Freq: Once | INTRAVENOUS | Status: AC | PRN
  Administered 2017-01-26: 32.187 via INTRAVENOUS

## 2017-01-26 MED ORDER — FUROSEMIDE 10 MG/ML IJ SOLN: 40.0000 mg | Freq: Once | INTRAMUSCULAR | Status: DC

## 2017-01-26 MED ORDER — PROCAINAMIDE HCL 100 MG/ML IJ SOLN
500.0000 mg | Freq: Once | INTRAVENOUS | Status: DC
  Filled 2017-01-26: qty 5

## 2017-01-26 MED ORDER — METOPROLOL TARTRATE 25 MG PO TABS
12.5000 mg | ORAL_TABLET | Freq: Once | ORAL | Status: AC
  Administered 2017-01-26: 12.5 mg via ORAL
  Filled 2017-01-26: qty 1

## 2017-01-26 MED ORDER — METOPROLOL TARTRATE 25 MG PO TABS
12.5000 mg | ORAL_TABLET | Freq: Two times a day (BID) | ORAL | Status: DC
  Administered 2017-01-26 – 2017-01-29 (×6): 12.5 mg via ORAL
  Filled 2017-01-26 (×6): qty 1

## 2017-01-26 MED ORDER — MIRABEGRON ER 25 MG PO TB24
25.0000 mg | ORAL_TABLET | Freq: Every day | ORAL | Status: DC
  Administered 2017-01-27 – 2017-01-29 (×3): 25 mg via ORAL
  Filled 2017-01-26 (×3): qty 1

## 2017-01-26 MED ORDER — NITROGLYCERIN 0.4 MG SL SUBL
0.4000 mg | SUBLINGUAL_TABLET | SUBLINGUAL | Status: DC | PRN
  Administered 2017-01-26 (×2): 0.4 mg via SUBLINGUAL
  Filled 2017-01-26: qty 1

## 2017-01-26 MED ORDER — INSULIN GLARGINE 100 UNIT/ML ~~LOC~~ SOLN
40.0000 [IU] | Freq: Every day | SUBCUTANEOUS | Status: DC
  Administered 2017-01-26 – 2017-01-28 (×3): 40 [IU] via SUBCUTANEOUS
  Filled 2017-01-26 (×4): qty 0.4

## 2017-01-26 MED ORDER — ASPIRIN EC 81 MG PO TBEC
81.0000 mg | DELAYED_RELEASE_TABLET | Freq: Every day | ORAL | Status: DC
  Administered 2017-01-27 – 2017-01-29 (×3): 81 mg via ORAL
  Filled 2017-01-26 (×3): qty 1

## 2017-01-26 MED ORDER — CARBIDOPA-LEVODOPA ER 25-100 MG PO TBCR
2.0000 | EXTENDED_RELEASE_TABLET | Freq: Four times a day (QID) | ORAL | Status: DC
  Administered 2017-01-26 – 2017-01-29 (×12): 2 via ORAL
  Filled 2017-01-26 (×16): qty 2

## 2017-01-26 MED ORDER — MAGNESIUM SULFATE 2 GM/50ML IV SOLN
2.0000 g | Freq: Once | INTRAVENOUS | Status: AC
  Administered 2017-01-26: 2 g via INTRAVENOUS
  Filled 2017-01-26: qty 50

## 2017-01-26 MED ORDER — HEPARIN (PORCINE) IN NACL 100-0.45 UNIT/ML-% IJ SOLN
1350.0000 [IU]/h | INTRAMUSCULAR | Status: DC
  Administered 2017-01-26 – 2017-01-27 (×2): 1200 [IU]/h via INTRAVENOUS
  Administered 2017-01-28: 1350 [IU]/h via INTRAVENOUS
  Filled 2017-01-26 (×4): qty 250

## 2017-01-26 MED ORDER — ESCITALOPRAM OXALATE 10 MG PO TABS
20.0000 mg | ORAL_TABLET | Freq: Every day | ORAL | Status: DC
  Administered 2017-01-27 – 2017-01-29 (×3): 20 mg via ORAL
  Filled 2017-01-26 (×3): qty 2

## 2017-01-26 MED ORDER — SACCHAROMYCES BOULARDII 250 MG PO CAPS
250.0000 mg | ORAL_CAPSULE | Freq: Every day | ORAL | Status: DC
  Administered 2017-01-27 – 2017-01-29 (×3): 250 mg via ORAL
  Filled 2017-01-26 (×3): qty 1

## 2017-01-26 MED ORDER — LORAZEPAM 1 MG PO TABS
1.0000 mg | ORAL_TABLET | Freq: Two times a day (BID) | ORAL | Status: DC | PRN
  Administered 2017-01-27 – 2017-01-29 (×4): 1 mg via ORAL
  Filled 2017-01-26 (×4): qty 1

## 2017-01-26 MED ORDER — ONDANSETRON HCL 4 MG/2ML IJ SOLN
4.0000 mg | Freq: Four times a day (QID) | INTRAMUSCULAR | Status: DC | PRN
  Administered 2017-01-26: 4 mg via INTRAVENOUS
  Filled 2017-01-26: qty 2

## 2017-01-26 MED ORDER — FUROSEMIDE 10 MG/ML IJ SOLN
40.0000 mg | Freq: Once | INTRAMUSCULAR | Status: AC
  Administered 2017-01-26: 40 mg via INTRAVENOUS
  Filled 2017-01-26: qty 4

## 2017-01-26 MED ORDER — ATORVASTATIN CALCIUM 20 MG PO TABS
20.0000 mg | ORAL_TABLET | Freq: Every day | ORAL | Status: DC
Start: 2017-01-26 — End: 2017-01-29
  Administered 2017-01-26 – 2017-01-29 (×4): 20 mg via ORAL
  Filled 2017-01-26 (×4): qty 1

## 2017-01-26 MED ORDER — SODIUM CHLORIDE 0.9% FLUSH
3.0000 mL | Freq: Two times a day (BID) | INTRAVENOUS | Status: DC
Start: 2017-01-26 — End: 2017-01-29
  Administered 2017-01-26 – 2017-01-29 (×5): 3 mL via INTRAVENOUS

## 2017-01-26 MED ORDER — HYDRALAZINE HCL 50 MG PO TABS
50.0000 mg | ORAL_TABLET | Freq: Two times a day (BID) | ORAL | Status: DC
  Administered 2017-01-26 – 2017-01-27 (×4): 50 mg via ORAL
  Filled 2017-01-26 (×4): qty 1

## 2017-01-26 MED ORDER — LORATADINE 10 MG PO TABS: 5.0000 mg | ORAL_TABLET | Freq: Every day | ORAL | Status: DC | PRN

## 2017-01-26 MED ORDER — CLOPIDOGREL BISULFATE 75 MG PO TABS
75.0000 mg | ORAL_TABLET | Freq: Every day | ORAL | Status: DC
  Administered 2017-01-27 – 2017-01-29 (×3): 75 mg via ORAL
  Filled 2017-01-26 (×3): qty 1

## 2017-01-26 MED ORDER — HEPARIN BOLUS VIA INFUSION
4000.0000 [IU] | Freq: Once | INTRAVENOUS | Status: AC
  Administered 2017-01-26: 4000 [IU] via INTRAVENOUS
  Filled 2017-01-26: qty 4000

## 2017-01-26 MED ORDER — TRAMADOL HCL 50 MG PO TABS: 50.0000 mg | ORAL_TABLET | Freq: Two times a day (BID) | ORAL | Status: DC | PRN

## 2017-01-26 MED ORDER — FUROSEMIDE 10 MG/ML IJ SOLN
80.0000 mg | Freq: Two times a day (BID) | INTRAMUSCULAR | Status: DC
  Administered 2017-01-27 – 2017-01-28 (×3): 80 mg via INTRAVENOUS
  Filled 2017-01-26 (×3): qty 8

## 2017-01-26 MED ORDER — NITROGLYCERIN IN D5W 200-5 MCG/ML-% IV SOLN
0.0000 ug/min | Freq: Once | INTRAVENOUS | Status: AC
  Administered 2017-01-26: 5 ug/min via INTRAVENOUS
  Filled 2017-01-26: qty 250

## 2017-01-26 MED ORDER — GABAPENTIN 300 MG PO CAPS
300.0000 mg | ORAL_CAPSULE | Freq: Every day | ORAL | Status: DC
  Administered 2017-01-27 – 2017-01-29 (×3): 300 mg via ORAL
  Filled 2017-01-26 (×3): qty 1

## 2017-01-26 MED ORDER — ACETAMINOPHEN 650 MG RE SUPP: 650.0000 mg | Freq: Four times a day (QID) | RECTAL | Status: DC | PRN

## 2017-01-26 MED ORDER — ACETAMINOPHEN 325 MG PO TABS: 650.0000 mg | ORAL_TABLET | Freq: Four times a day (QID) | ORAL | Status: DC | PRN

## 2017-01-26 MED ORDER — METOPROLOL TARTRATE 5 MG/5ML IV SOLN
5.0000 mg | Freq: Once | INTRAVENOUS | Status: AC
  Administered 2017-01-26: 5 mg via INTRAVENOUS
  Filled 2017-01-26: qty 5

## 2017-01-26 MED ORDER — ISOSORBIDE MONONITRATE ER 60 MG PO TB24
60.0000 mg | ORAL_TABLET | Freq: Once | ORAL | Status: AC
  Administered 2017-01-26: 60 mg via ORAL
  Filled 2017-01-26: qty 1

## 2017-01-26 MED ORDER — ASPIRIN 81 MG PO CHEW
324.0000 mg | CHEWABLE_TABLET | Freq: Once | ORAL | Status: AC
  Administered 2017-01-26: 324 mg via ORAL
  Filled 2017-01-26: qty 4

## 2017-01-26 NOTE — Progress Notes (Signed)
ANTICOAGULATION CONSULT NOTE - Initial Consult  Pharmacy Consult for Heparin Drip  Indication: pulmonary embolus  No Known Allergies  Patient Measurements: Height:  (167.6 cm) Weight: 165 lb (74.8 kg) IBW/kg (Calculated) : 59.3  Vital Signs: Temp: 98.2 F (36.8 C) (04/17 1105) Temp Source: Axillary (04/17 1105) BP: 164/92 (04/17 1400) Pulse Rate: 69 (04/17 1400)  Labs:  Recent Labs  01/26/17 1112  HGB 10.1*  HCT 30.2*  PLT 144*  CREATININE 2.51*  TROPONINI 0.94*    Estimated Creatinine Clearance: 19.4 mL/min (A) (by C-G formula based on SCr of 2.51 mg/dL (H)).   Medical History: Past Medical History:  Diagnosis Date  . Anxiety   . Arthritis   . Chronic kidney disease   . Coronary artery disease   . Depression   . Diabetes mellitus without complication ([facility])   . Hyperlipidemia   . Hypertension   . Parkinson disease ([facility])   . Renal disorder     Assessment: 76 yo female with acute respiratory distress and possible PE. Pharmacy consulted for heparin dosing and monitoring.   Goal of Therapy:  Heparin level 0.3-0.7 units/ml Monitor platelets by anticoagulation protocol: Yes   Plan:  Baseline labs ordered Give 4000 units bolus x 1 Start heparin infusion at 1200 units/hr Check anti-Xa level in 8 hours and daily while on heparin Continue to monitor H&H and platelets  Gardner Candle, PharmD, BCPS Clinical Pharmacist 01/26/2017 2:26 PM

## 2017-01-26 NOTE — Progress Notes (Signed)
*  PRELIMINARY RESULTS* Echocardiogram 2D Echocardiogram has been performed.  Molly Wolf 01/26/2017, 3:21 PM

## 2017-01-26 NOTE — ED Notes (Signed)
Echo at bedside

## 2017-01-26 NOTE — ED Notes (Signed)
Date and time results received: 01/26/17 1153 (use smartphrase ".now" to insert current time)  Test: Troponin Critical Value: 0.94  Name of Provider Notified: Dr. Don Perking  Orders Received? Or Actions Taken?: Orders Received - See Orders for details

## 2017-01-26 NOTE — ED Notes (Signed)
Patient oxygen sats 86% on room air.  Patient placed on 2L [place] for comfort.

## 2017-01-26 NOTE — ED Notes (Signed)
Admitting MD at bedside.

## 2017-01-26 NOTE — Progress Notes (Signed)
eLink Physician-Brief Progress Note Patient Name: Zen Felling DOB: 1940/12/05 MRN: [id number]   Date of Service  01/26/2017  HPI/Events of Note  76 yo with acute resp distress PCCM consulted  eICU Interventions  Findings c/w acute CHF with acute cardiorenal syndrome      Intervention Category Evaluation Type: New Patient Evaluation  Jordynn Marcella 01/26/2017, 5:29 PM

## 2017-01-26 NOTE — ED Triage Notes (Signed)
Pt to ed with c/o sob since yesterday.  Pt with wet cough noted at triage.  Pt also reports sob with laying flat.

## 2017-01-26 NOTE — H&P (Signed)
[facility] [place] NAME: Molly Wolf    MR#:  [id number]  DATE OF BIRTH:  [birth year]  DATE OF ADMISSION:  01/26/2017  PRIMARY CARE PHYSICIAN: Arlyce Harman Luther Redo, MD   REQUESTING/REFERRING PHYSICIAN: Aundria Rud  CHIEF COMPLAINT:   Chief Complaint  Patient presents with  . Shortness of Breath    HISTORY OF PRESENT ILLNESS:  Molly Wolf  is a 76 y.o. female presented with shortness of breath for a few days. She just got out of rehabilitation yesterday. She was in rehabilitation after being discharged from the hospital with a right patella fracture and she is in a brace. She has been coughing up whitish phlegm. She can't breathe well. Her son called her daughter today because of her shortness of breath and was brought in to the ER. In the ER, she was found to be in acute respiratory failure and placed on BiPAP. She is felt to be in heart failure and given a dose of Lasix and started on nitro drip. With her creatinine being elevated a VQ scan was ordered to rule out pulmonary embolism. She also had episodes that looked like ventricular tachycardia but are actually atrial flutter. No complaints of chest pain. Hospitalist services were contacted for admission.  PAST MEDICAL HISTORY:   Past Medical History:  Diagnosis Date  . Anxiety   . Arthritis   . Chronic kidney disease   . Coronary artery disease   . Depression   . Diabetes mellitus without complication ([place])   . Hyperlipidemia   . Hypertension   . Parkinson disease ([place])   . Renal disorder     PAST SURGICAL HISTORY:   Past Surgical History:  Procedure Laterality Date  . BACK SURGERY    . CORONARY ANGIOPLASTY WITH STENT PLACEMENT    . NECK SURGERY      SOCIAL HISTORY:   Social History  Substance Use Topics  . Smoking status: Never Smoker  . Smokeless tobacco: Never Used  . Alcohol use No    FAMILY HISTORY:   Family History  Problem Relation Age of  Onset  . Cervical cancer Mother   . Kidney failure Father   . CAD Father   . CAD Brother   . Prostate cancer Neg Hx   . Kidney cancer Neg Hx   . Bladder Cancer Neg Hx     DRUG ALLERGIES:  No Known Allergies  REVIEW OF SYSTEMS:  CONSTITUTIONAL: No fever. Positive for fatigue.  EYES: No blurred or double vision. Wears glasses. EARS, NOSE, AND THROAT: No tinnitus or ear pain. Positive for sore throat. Positive for runny nose RESPIRATORY: Some cough with whitish phlegm, positive for shortness of breath. Occasional wheezing. No hemoptysis.  CARDIOVASCULAR: No chest pain, orthopnea, edema.  GASTROINTESTINAL: Some nausea and vomiting. No diarrhea or abdominal pain. No blood in bowel movements GENITOURINARY: No dysuria, hematuria.  ENDOCRINE: No polyuria, nocturia,  HEMATOLOGY: No anemia, easy bruising or bleeding SKIN: No rash or lesion. MUSCULOSKELETAL: No joint pain or arthritis.   NEUROLOGIC: No tingling, numbness. Felt faint  PSYCHIATRY: History of anxiety and depression.   MEDICATIONS AT HOME:   Prior to Admission medications   Medication Sig Start Date End Date Taking? Authorizing Provider  acetaminophen (TYLENOL) 325 MG tablet Take 650 mg by mouth every 6 (six) hours as needed for mild pain.   Yes Historical Provider, MD  atorvastatin (LIPITOR) 20 MG tablet Take 20 mg by mouth daily.   Yes Historical Provider,  MD  Carbidopa-Levodopa ER (SINEMET CR) 25-100 MG tablet controlled release Take 2 tablets by mouth 4 (four) times daily.   Yes Historical Provider, MD  clopidogrel (PLAVIX) 75 MG tablet Take 75 mg by mouth daily.   Yes Historical Provider, MD  Coenzyme Q10 (COQ10) 50 MG CAPS Take 50 mg by mouth daily.   Yes Historical Provider, MD  CRANBERRY PO Take 1 tablet by mouth daily.   Yes Historical Provider, MD  escitalopram (LEXAPRO) 20 MG tablet Take 20 mg by mouth daily.   Yes Historical Provider, MD  furosemide (LASIX) 20 MG tablet Take 20 mg by mouth daily.    Yes Historical  Provider, MD  gabapentin (NEURONTIN) 300 MG capsule Take 300 mg by mouth daily.    Yes Historical Provider, MD  hydrALAZINE (APRESOLINE) 50 MG tablet Take 50 mg by mouth 2 (two) times daily.    Yes Historical Provider, MD  insulin glargine (LANTUS) 100 UNIT/ML injection Inject 40 Units into the skin at bedtime.    Yes Historical Provider, MD  insulin lispro (HUMALOG) 100 UNIT/ML injection Inject 6 Units into the skin 3 (three) times daily with meals.    Yes Historical Provider, MD  isosorbide mononitrate (IMDUR) 60 MG 24 hr tablet Take 60 mg by mouth daily.   Yes Historical Provider, MD  loratadine (CLARITIN) 10 MG tablet Take 5 mg by mouth daily as needed for allergies.    Yes Historical Provider, MD  LORazepam (ATIVAN) 1 MG tablet Take 1 tablet (1 mg total) by mouth 2 (two) times daily as needed for anxiety. 12/29/16  Yes Max Sane, MD  metoprolol tartrate (LOPRESSOR) 25 MG tablet Take 12.5 mg by mouth 2 (two) times daily.   Yes Historical Provider, MD  mirabegron ER (MYRBETRIQ) 25 MG TB24 tablet Take 25 mg by mouth daily.   Yes Historical Provider, MD  nitroGLYCERIN (NITROSTAT) 0.4 MG SL tablet Place 0.4 mg under the tongue every 5 (five) minutes as needed for chest pain.   Yes Historical Provider, MD  Omega-3 Fatty Acids (FISH OIL) 1000 MG CAPS Take 1,000 mg by mouth daily.   Yes Historical Provider, MD  polyethylene glycol (MIRALAX / GLYCOLAX) packet Take 17 g by mouth daily as needed for mild constipation.   Yes Historical Provider, MD  promethazine (PHENERGAN) 25 MG tablet Take 25 mg by mouth every 6 (six) hours as needed for nausea or vomiting.   Yes Historical Provider, MD  saccharomyces boulardii (FLORASTOR) 250 MG capsule Take 250 mg by mouth daily.   Yes Historical Provider, MD  traMADol (ULTRAM) 50 MG tablet Take 1 tablet (50 mg total) by mouth daily as needed. 12/29/16  Yes Max Sane, MD  blood glucose meter kit and supplies KIT Dispense based on patient and insurance preference. Use up  to four times daily as directed. (FOR ICD-9 250.00, 250.01). 11/06/16   Rudene Re, MD      VITAL SIGNS:  Blood pressure (!) 148/97, pulse 67, temperature 98.2 F (36.8 C), temperature source Axillary, resp. rate 19, height _0  (1.676 m), weight 74.8 kg (165 lb), SpO2 100 %.  PHYSICAL EXAMINATION:  GENERAL:  76 y.o.-year-old patient lying in the bed with Respiratory acute distress.  EYES: Pupils equal, round, reactive to light and accommodation. No scleral icterus. Extraocular muscles intact.  HEENT: Head atraumatic, normocephalic. Oropharynx and nasopharynx clear.  NECK:  Supple, positive for jugular venous distention. No thyroid enlargement, no tenderness.  LUNGS: Decreased breath sounds bilaterally, no wheezing. Positive rales at  the bases. No rhonchi or crepitation. No use of accessory muscles of respiration.  CARDIOVASCULAR: S1, S2 normal. No murmurs, rubs, or gallops.  ABDOMEN: Soft, nontender, nondistended. Bowel sounds present. No organomegaly or mass.  EXTREMITIES: Trace edema. No cyanosis, or clubbing.  NEUROLOGIC: Cranial nerves II through XII are intact. Sensation intact. Gait not checked.  PSYCHIATRIC: The patient is alert and oriented x 3.  SKIN: No rash, lesion, or ulcer.   LABORATORY PANEL:   CBC  Recent Labs Lab 01/26/17 1112  WBC 6.0  HGB 10.1*  HCT 30.2*  PLT 144*   ------------------------------------------------------------------------------------------------------------------  Chemistries   Recent Labs Lab 01/26/17 1112  NA 137  K 4.8  CL 103  CO2 21*  GLUCOSE 276*  BUN 35*  CREATININE 2.51*  CALCIUM 8.4*  MG 1.7   ------------------------------------------------------------------------------------------------------------------  Cardiac Enzymes  Recent Labs Lab 01/26/17 1112  TROPONINI 0.94*   ------------------------------------------------------------------------------------------------------------------  RADIOLOGY:  Dg  Chest 2 View  Result Date: 01/26/2017 CLINICAL DATA:  Shortness of breath since yesterday, productive cough, orthopnea. History of diabetes and coronary artery disease with angioplasty and stent placement. Nonsmoker. EXAM: CHEST  2 VIEW COMPARISON:  Chest x-ray of November 06, 2016 FINDINGS: The lungs are well-expanded. The pulmonary vascularity is engorged. The interstitial markings are mildly increased bilaterally. The cardiac silhouette is enlarged. There are small bilateral pleural effusions layering posteriorly. There is calcification in the wall of the aortic arch. There is multilevel degenerative disc disease of the thoracic spine. IMPRESSION: CHF with mild pulmonary interstitial edema new since the previous study. No acute pneumonia. Thoracic aortic atherosclerosis. Electronically Signed   By: David  Martinique M.D.   On: 01/26/2017 11:52    EKG:   1 EKG shows sinus rhythm 94 bpm Q waves anteriorly 2. Next EKG tracing showed atrial flutter 126 bpm  IMPRESSION AND PLAN:   1.  Acute hypoxic respiratory failure requiring BiPAP on presentation to oxygenate and to move air. Admit to the CCU stepdown. Case discussed with critical care specialist. 2. Acute CHF. Echocardiogram to determine ejection fraction. Give another dose of 40 of Lasix to make a total of 80 Lasix IV twice a day. Patient placed on nitro drip. Metoprolol to be continued. 3. Rule out pulmonary embolism with VQ scan and ultrasound of the lower extremities. Empiric heparin drip for right now. Patient is a set up for pulmonary embolism with recent rehabilitation stay with a right patellar fracture in a leg brace. 4. Elevated troponin. Could be demand ischemia from acute respiratory failure and acute CHF. Continue to monitor troponins. Heparin drip aspirin and Plavix. Patient does have a history of coronary artery disease 4.5 atrial flutter. Give IV magnesium. Continue oral metoprolol 5. Type 2 diabetes mellitus on sliding scale and Lantus  insulin 6. Parkinson's disease continue Sinemet 7. History of COPD put on nebulizer treatments 8. Essential hypertension continue usual medications for now 9. Chronic kidney disease stage III monitor with diuresis 10. Right patella fracture history 11. Hyperlipidemia unspecified continue statin  All the records are reviewed and case discussed with ED provider. Management plans discussed with the patient, family and they are in agreement.  CODE STATUS: Full code  TOTAL TIME TAKING CARE OF THIS PATIENT: 55 minutes, patient will be admitted to the critical care unit secondary to being critically ill. Case discussed with cardiology Dr. Saunders Revel and critical care specialist. Case discussed with ER physician. Case discussed with daughter at the bedside   Milas Hock.D on 01/26/2017 at 2:51 PM  Between 7am to 6pm - Pager - [phone]  After 6pm call admission pager [phone]  [facility]  [phone]  CC: Primary care physician; Suann Larry, MD

## 2017-01-26 NOTE — Progress Notes (Signed)
Dr. Renae Gloss made aware of troponin of 2.26/ on heparin gtt/ cardio following / will continue to monitor

## 2017-01-26 NOTE — Progress Notes (Addendum)
Patient ID: Molly Wolf, female   DOB: [birth year], 76 y.o.   MRN: [id number] Patient taken off bipap to go to nuclear medicine on nasal canula.  ICU called me that since she is off bipap and no longer meets criteria to come to icu, can be admitted to telemetry. Ordered changed. Dr Renae Gloss

## 2017-01-26 NOTE — ED Notes (Signed)
Called Echo Tech to inform of order for echo 1426

## 2017-01-26 NOTE — ED Notes (Signed)
A second EKG was done at 1120 due to not being able to obtain a clear EKG in triage because pt has Parkinson's. Second EKG was performed while pt was laying down in bed.

## 2017-01-26 NOTE — Consult Note (Signed)
Name: Molly Wolf MRN: [id number] DOB: [birth year]    ADMISSION DATE:  01/26/2017 CONSULTATION DATE:  01/26/17  REFERRING MD : Dr. Leslye Peer  CHIEF COMPLAINT:  Acute Respiratory Distress  BRIEF PATIENT DESCRIPTION: 76 year old female with CHF EXACERBATION  SIGNIFICANT EVENTS  4/17 >> Patient admitted to [facility] with Acute Shortness of breath related to CHF Exacerbation  STUDIES:  01/26/17 ECHO>>Wall thickness was increased in a pattern of mildLVH.   There was moderate focal basal hypertrophy of the septum. Systolic function was mildly reduced. The estimated ejectionfraction was in the range of 45% to 50% 01/26/17 Vascular ultrasound>>Occlusive thrombus of the right peroneal vein in the calf  HISTORY OF PRESENT ILLNESS:  Molly Wolf  Is a 76 year old female with known history of CKD, COPD,CAD,Diastolic dysfunction,Parkinsons, and DM.  She recently fell and suffered a right patellar fracture.   She had been at  [facility] for rehab and released on 4/16.  Over the past week or so she has had progressive dyspnea on exertion.She was brought to [facility] on 4/17 for worsening dyspnea.  Upon arrival she was noted to be Hypertensive  With SBP of 187. Her rhythm initially was concerning for V-tach however subsequent EKG confirmed it to SR.  BNP is elevated upto 2049 and CXR is concerning for CHF exacerbation with mild Pulmonary edema. Patient was placed initially on BiPAP and later was transitioned to nasal canula. PCCM was consulted for further recommendation.   PAST MEDICAL HISTORY :   has a past medical history of Anxiety; Arthritis; CKD (chronic kidney disease), stage IV ([place]); Closed patellar sleeve fracture of right knee; COPD (chronic obstructive pulmonary disease) ([place]); Coronary artery disease; Depression; Diabetes mellitus without complication ([place]); Diastolic dysfunction; Hyperlipidemia; Hypertension; and Parkinson disease ([place]).  has a past surgical history that includes Coronary angioplasty  with stent; Back surgery; and Neck surgery. Prior to Admission medications   Medication Sig Start Date End Date Taking? Authorizing Provider  acetaminophen (TYLENOL) 325 MG tablet Take 650 mg by mouth every 6 (six) hours as needed for mild pain.   Yes Historical Provider, MD  atorvastatin (LIPITOR) 20 MG tablet Take 20 mg by mouth daily.   Yes Historical Provider, MD  Carbidopa-Levodopa ER (SINEMET CR) 25-100 MG tablet controlled release Take 2 tablets by mouth 4 (four) times daily.   Yes Historical Provider, MD  clopidogrel (PLAVIX) 75 MG tablet Take 75 mg by mouth daily.   Yes Historical Provider, MD  Coenzyme Q10 (COQ10) 50 MG CAPS Take 50 mg by mouth daily.   Yes Historical Provider, MD  CRANBERRY PO Take 1 tablet by mouth daily.   Yes Historical Provider, MD  escitalopram (LEXAPRO) 20 MG tablet Take 20 mg by mouth daily.   Yes Historical Provider, MD  furosemide (LASIX) 20 MG tablet Take 20 mg by mouth daily.    Yes Historical Provider, MD  gabapentin (NEURONTIN) 300 MG capsule Take 300 mg by mouth daily.    Yes Historical Provider, MD  hydrALAZINE (APRESOLINE) 50 MG tablet Take 50 mg by mouth 2 (two) times daily.    Yes Historical Provider, MD  insulin glargine (LANTUS) 100 UNIT/ML injection Inject 40 Units into the skin at bedtime.    Yes Historical Provider, MD  insulin lispro (HUMALOG) 100 UNIT/ML injection Inject 6 Units into the skin 3 (three) times daily with meals.    Yes Historical Provider, MD  isosorbide mononitrate (IMDUR) 60 MG 24 hr tablet Take 60 mg by mouth daily.  Yes Historical Provider, MD  loratadine (CLARITIN) 10 MG tablet Take 5 mg by mouth daily as needed for allergies.    Yes Historical Provider, MD  LORazepam (ATIVAN) 1 MG tablet Take 1 tablet (1 mg total) by mouth 2 (two) times daily as needed for anxiety. 12/29/16  Yes Max Sane, MD  metoprolol tartrate (LOPRESSOR) 25 MG tablet Take 12.5 mg by mouth 2 (two) times daily.   Yes Historical Provider, MD  mirabegron ER  (MYRBETRIQ) 25 MG TB24 tablet Take 25 mg by mouth daily.   Yes Historical Provider, MD  nitroGLYCERIN (NITROSTAT) 0.4 MG SL tablet Place 0.4 mg under the tongue every 5 (five) minutes as needed for chest pain.   Yes Historical Provider, MD  Omega-3 Fatty Acids (FISH OIL) 1000 MG CAPS Take 1,000 mg by mouth daily.   Yes Historical Provider, MD  polyethylene glycol (MIRALAX / GLYCOLAX) packet Take 17 g by mouth daily as needed for mild constipation.   Yes Historical Provider, MD  promethazine (PHENERGAN) 25 MG tablet Take 25 mg by mouth every 6 (six) hours as needed for nausea or vomiting.   Yes Historical Provider, MD  saccharomyces boulardii (FLORASTOR) 250 MG capsule Take 250 mg by mouth daily.   Yes Historical Provider, MD  traMADol (ULTRAM) 50 MG tablet Take 1 tablet (50 mg total) by mouth daily as needed. 12/29/16  Yes Max Sane, MD  blood glucose meter kit and supplies KIT Dispense based on patient and insurance preference. Use up to four times daily as directed. (FOR ICD-9 250.00, 250.01). 11/06/16   Rudene Re, MD   No Known Allergies  FAMILY HISTORY:  family history includes CAD in her brother and father; Cervical cancer in her mother; Kidney failure in her father. SOCIAL HISTORY:  reports that she has never smoked. She has never used smokeless tobacco. She reports that she does not drink alcohol or use drugs.  REVIEW OF SYSTEMS:   Constitutional: Negative for fever, chills, weight loss, malaise/fatigue and diaphoresis.  HENT: Negative for hearing loss, ear pain, nosebleeds, congestion, sore throat, neck pain, tinnitus and ear discharge.   Eyes: Negative for blurred vision, double vision, photophobia, pain, discharge and redness.  Respiratory: Negative for cough, hemoptysis, sputum production, shortness of breath, wheezing and stridor.   Cardiovascular: Negative for chest pain, palpitations, orthopnea, claudication, leg swelling and PND.  Gastrointestinal: Negative for heartburn,  nausea, vomiting, abdominal pain, diarrhea, constipation, blood in stool and melena.  Genitourinary: Negative for dysuria, urgency, frequency, hematuria and flank pain.  Musculoskeletal: Negative for myalgias, back pain, joint pain and falls.  Skin: Negative for itching and rash.  Neurological: Negative for dizziness, tingling, tremors, sensory change, speech change, focal weakness, seizures, loss of consciousness, weakness and headaches.  Endo/Heme/Allergies: Negative for environmental allergies and polydipsia. Does not bruise/bleed easily.  SUBJECTIVE: "Patient states that she is not short of breath, her breathing has improved"  VITAL SIGNS: Temp:  [97.9 F (36.6 C)-98.4 F (36.9 C)] 98.4 F (36.9 C) (04/17 1952) Pulse Rate:  [52-92] 52 (04/17 1952) Resp:  [14-30] 16 (04/17 1952) BP: (141-206)/(30-120) 158/89 (04/17 1952) SpO2:  [93 %-100 %] 97 % (04/17 1952) Weight:  [74.8 kg (165 lb)-77.6 kg (171 lb 1.6 oz)] 77.6 kg (171 lb 1.6 oz) (04/17 1737)  PHYSICAL EXAMINATION: General:  Elderly Caucasian female,with resting tremors that is worse with intention Neuro:  Awake, alert, oriented  HEENT:  AT,[place],perrla Cardiovascular: S1S2,Regular, +Murmur Lungs:  Diminished bibasilar, no wheezes, crackles, rhonchi noted Abdomen:  Soft, ND,NT  Musculoskeletal:  No edema, cyanosis present Skin:  Warm, dry and intact   Recent Labs Lab 01/26/17 1112  NA 137  K 4.8  CL 103  CO2 21*  BUN 35*  CREATININE 2.51*  GLUCOSE 276*    Recent Labs Lab 01/26/17 1112  HGB 10.1*  HCT 30.2*  WBC 6.0  PLT 144*   Dg Chest 2 View  Result Date: 01/26/2017 CLINICAL DATA:  Shortness of breath since yesterday, productive cough, orthopnea. History of diabetes and coronary artery disease with angioplasty and stent placement. Nonsmoker. EXAM: CHEST  2 VIEW COMPARISON:  Chest x-ray of November 06, 2016 FINDINGS: The lungs are well-expanded. The pulmonary vascularity is engorged. The interstitial markings are  mildly increased bilaterally. The cardiac silhouette is enlarged. There are small bilateral pleural effusions layering posteriorly. There is calcification in the wall of the aortic arch. There is multilevel degenerative disc disease of the thoracic spine. IMPRESSION: CHF with mild pulmonary interstitial edema new since the previous study. No acute pneumonia. Thoracic aortic atherosclerosis. Electronically Signed   By: David  [place] M.D.   On: 01/26/2017 11:52   Nm Pulmonary Perf And Vent  Result Date: 01/26/2017 CLINICAL DATA:  Shortness of breath EXAM: NUCLEAR MEDICINE VENTILATION - PERFUSION LUNG SCAN TECHNIQUE: Ventilation images were obtained in multiple projections using inhaled aerosol Tc-40m DTPA. Perfusion images were obtained in multiple projections after intravenous injection of Tc-60m MAA. RADIOPHARMACEUTICALS:  32.1 mCi Technetium-59m DTPA aerosol inhalation and 3.5 mCi Technetium-28m MAA IV COMPARISON:  Chest radiograph earlier today. FINDINGS: Ventilation: No focal ventilation defect. Perfusion: No wedge shaped peripheral perfusion defects to suggest acute pulmonary embolism. IMPRESSION: Low probability pulmonary embolus. Electronically Signed   By: Elsie Stain M.D.   On: 01/26/2017 17:02   US Venous Img Lower Bilateral  Result Date: 01/26/2017 CLINICAL DATA:  76 y/o F; history of right knee fracture with shortness of breath. EXAM: BILATERAL LOWER EXTREMITY VENOUS DOPPLER ULTRASOUND TECHNIQUE: Gray-scale sonography with graded compression, as well as color Doppler and duplex ultrasound were performed to evaluate the lower extremity deep venous systems from the level of the common femoral vein and including the common femoral, femoral, profunda femoral, popliteal and calf veins including the posterior tibial, peroneal and gastrocnemius veins when visible. The superficial great saphenous vein was also interrogated. Spectral Doppler was utilized to evaluate flow at rest and with distal  augmentation maneuvers in the common femoral, femoral and popliteal veins. COMPARISON:  None. FINDINGS: RIGHT LOWER EXTREMITY Common Femoral Vein: No evidence of thrombus. Normal compressibility, respiratory phasicity and response to augmentation. Saphenofemoral Junction: No evidence of thrombus. Normal compressibility and flow on color Doppler imaging. Profunda Femoral Vein: No evidence of thrombus. Normal compressibility and flow on color Doppler imaging. Femoral Vein: No evidence of thrombus. Normal compressibility, respiratory phasicity and response to augmentation. Popliteal Vein: No evidence of thrombus. Normal compressibility, respiratory phasicity and response to augmentation. Calf Veins: Occlusive thrombus of the right peroneal vein. Other calf veins of are patent. Superficial Great Saphenous Vein: No evidence of thrombus. Normal compressibility and flow on color Doppler imaging. Venous Reflux:  None. Other Findings:  None. LEFT LOWER EXTREMITY Common Femoral Vein: No evidence of thrombus. Normal compressibility, respiratory phasicity and response to augmentation. Saphenofemoral Junction: No evidence of thrombus. Normal compressibility and flow on color Doppler imaging. Profunda Femoral Vein: No evidence of thrombus. Normal compressibility and flow on color Doppler imaging. Femoral Vein: No evidence of thrombus. Normal compressibility, respiratory phasicity and response to augmentation. Popliteal Vein: No evidence of  thrombus. Normal compressibility, respiratory phasicity and response to augmentation. Calf Veins: No evidence of thrombus. Normal compressibility and flow on color Doppler imaging. Superficial Great Saphenous Vein: No evidence of thrombus. Normal compressibility and flow on color Doppler imaging. Venous Reflux:  None. Other Findings: Left popliteal fossa complex cyst measuring 4.8 x 1.5 x 2.7 cm, probably a Baker cyst. IMPRESSION: 1. Occlusive thrombus of the right peroneal vein in the calf. No  evidence of additional thrombus of the bilateral lower extremities. 2. Left popliteal fossa complex cyst, probably a Baker cyst. These results will be called to the ordering clinician or representative by the Radiologist Assistant, and communication documented in the PACS or zVision Dashboard. Electronically Signed   By: Kristine Garbe M.D.   On: 01/26/2017 14:59    ASSESSMENT / PLAN: Cardiorenal Syndrome  Acute shortness of breath related to Acute combined systolic/diastolic CHF CKD Stage IV Occlusive thrombus of the right peroneal vein in the calf Elevated Troponin possibly demand ischemia Hypertensive urgency   Heparin GTT Trend Troponin Diuresed with Lasix Continue Aspirin Continue imdur, metoprolol Continue Nitro gtt PRN Hydralazine for SBP<170 Worsening renal function,follow BUN and creatinine with the use of lasix Cardiology following     Bincy Varughese,AG-ACNP Pulmonary and [place]   01/26/2017, 8:39 PM   Merton Border, MD PCCM service Mobile [phone] Pager [phone] 01/27/2017

## 2017-01-26 NOTE — Consult Note (Signed)
Cardiology Consult    Patient ID: Molly Wolf MRN: [id number], DOB/AGE: September 04, 1941   Admit date: 01/26/2017 Date of Consult: 01/26/2017  Primary Physician: Darliss Ridgel, MD Primary Cardiologist: New - C. End, MD  Requesting Provider: R. Renae Gloss, MD  Patient Profile    Molly Wolf is a 76 y.o. female with a history of CAD s/p stenting, CKD IV, Parkinsons, HTN, HL, DM, and COPD, who is being seen today for the evaluation of dyspnea at the request of Dr. Renae Gloss.  Past Medical History   Past Medical History:  Diagnosis Date  . Anxiety   . Arthritis   . CKD (chronic kidney disease), stage IV (HCC)   . Closed patellar sleeve fracture of right knee    a. 01/2017 - conservatively managed.  Marland Kitchen COPD (chronic obstructive pulmonary disease) (HCC)   . Coronary artery disease    a. s/p remote stenting of unknown vessel @ [facility].  . Depression   . Diabetes mellitus without complication (HCC)   . Diastolic dysfunction    a. 01/2008 Echo ([facility]): EF > 55%, mod-sev LVH w/ grade 1 DD, biatrial enlargement, mild AS, trace MR/TR.  Marland Kitchen Hyperlipidemia   . Hypertension   . Parkinson disease [facility])     Past Surgical History:  Procedure Laterality Date  . BACK SURGERY    . CORONARY ANGIOPLASTY WITH STENT PLACEMENT    . NECK SURGERY       Allergies  No Known Allergies  History of Present Illness    76  y/o ? with a h/o CAD s/p prior stenting of unknown vessel, HTN, HL, diast dysfxn, parkinsons, DM, CKD IV, and COPD.  She recently fell and suffered a right patellar fracture.  She has been seen by ortho and has been managed conservatively/splinted.  She had been @ [facility] to rehab and was released yesterday.  Per pt and family, over the past week or so, she has had progressive DOE and also dyspnea on exertion.  Her dtr says that a cxr was recently performed while @ rehab and was reportedly ok.  She had worsening dyspnea overnight and this am, prompting ER visit this am  Upon arrival, she  was hypertensive (187/30).  She was placed on BiPAP.  ECG showed sinus rhythm.  There was concern for VT and subsequently atrial flutter, however review of ecg's and tele clearly show sinus rhythm with a significant amount of baseline artifact related to tremors. CXR showed CHF w/ interstitial edema.  Creat is mildly elevated above baseline @ 2.51. BNP up @ 2049. Stable anemia.  She is breathing much better on BiPAP.  A lower ext u/s has shown an occlusive thrombus of the right peroneal vein in the calf.  ? Baker's cyst on the left.  Stat echo has shown mild LV dysfxn (45-50%), grade 2 diast dysfxn, mild to mod AS, mkild AI, mild to mod MR, mild TR, mildly dil LA/RA, and a small pericardial effusion.  Inpatient Medications    . [START ON 01/27/2017] aspirin EC  81 mg Oral Daily  . atorvastatin  20 mg Oral Daily  . Carbidopa-Levodopa ER  2 tablet Oral QID  . [START ON 01/27/2017] clopidogrel  75 mg Oral Daily  . [START ON 01/27/2017] escitalopram  20 mg Oral Daily  . furosemide  40 mg Intravenous Once  . [START ON 01/27/2017] furosemide  80 mg Intravenous BID  . [START ON 01/27/2017] gabapentin  300 mg Oral Daily  . hydrALAZINE  50 mg Oral  BID  . insulin glargine  40 Units Subcutaneous QHS  . metoprolol tartrate  12.5 mg Oral BID  . [START ON 01/27/2017] mirabegron ER  25 mg Oral Daily  . [START ON 01/27/2017] saccharomyces boulardii  250 mg Oral Daily  . sodium chloride flush  3 mL Intravenous Q12H    Family History    Family History  Problem Relation Age of Onset  . Cervical cancer Mother   . Kidney failure Father   . CAD Father   . CAD Brother   . Prostate cancer Neg Hx   . Kidney cancer Neg Hx   . Bladder Cancer Neg Hx     Social History    Social History   Social History  . Marital status: Single    Spouse name: N/A  . Number of children: N/A  . Years of education: N/A   Occupational History  . Not on file.   Social History Main Topics  . Smoking status: Never Smoker  .  Smokeless tobacco: Never Used  . Alcohol use No  . Drug use: No  . Sexual activity: No   Other Topics Concern  . Not on file   Social History Narrative  . No narrative on file     Review of Systems    General:  No chills, fever, night sweats or weight changes.  Cardiovascular:  No chest pain, +++ dyspnea on exertion, +++ bilat LE edema, +++ orthopnea, no palpitations, paroxysmal nocturnal dyspnea. Dermatological: No rash, lesions/masses Respiratory: +++ cough and congestion, +++ dyspnea Urologic: No hematuria, dysuria Abdominal:   No nausea, vomiting, diarrhea, bright red blood per rectum, melena, or hematemesis Neurologic:  No visual changes, wkns, changes in mental status. All other systems reviewed and are otherwise negative except as noted above.  Physical Exam    Blood pressure (!) 165/93, pulse 64, temperature 98.2 F (36.8 C), temperature source Axillary, resp. rate 20, height  (1.676 m), weight 165 lb (74.8 kg), SpO2 100 %.  General: Pleasant, currently requiring BiPAP. Psych: Flat affect. Neuro: Alert and oriented X 3. Resting tremor that is worse with intention.  Moves all extremities spontaneously. HEENT: Normal  Neck: Supple without bruits.  Difficult to gauge jvp. Lungs:  Resp regular and unlabored, bibasilar crackles. Heart: RRR no s3, s4,2/6 holosytsolic murmur loudest @ apex. Abdomen: Soft, non-tender, non-distended, BS + x 4.  Extremities: No clubbing, cyanosis or edema. DP/PT/Radials 2+ and equal bilaterally.  Labs     Recent Labs  01/26/17 1112  TROPONINI 0.94*   Lab Results  Component Value Date   WBC 6.0 01/26/2017   HGB 10.1 (L) 01/26/2017   HCT 30.2 (L) 01/26/2017   MCV 87.3 01/26/2017   PLT 144 (L) 01/26/2017     Recent Labs Lab 01/26/17 1112  NA 137  K 4.8  CL 103  CO2 21*  BUN 35*  CREATININE 2.51*  CALCIUM 8.4*  GLUCOSE 276*     Radiology Studies    Dg Chest 2 View  Result Date: 01/26/2017 CLINICAL DATA:  Shortness  of breath since yesterday, productive cough, orthopnea. History of diabetes and coronary artery disease with angioplasty and stent placement. Nonsmoker. EXAM: CHEST  2 VIEW COMPARISON:  Chest x-ray of November 06, 2016 FINDINGS: The lungs are well-expanded. The pulmonary vascularity is engorged. The interstitial markings are mildly increased bilaterally. The cardiac silhouette is enlarged. There are small bilateral pleural effusions layering posteriorly. There is calcification in the wall of the aortic arch. There is multilevel  degenerative disc disease of the thoracic spine. IMPRESSION: CHF with mild pulmonary interstitial edema new since the previous study. No acute pneumonia. Thoracic aortic atherosclerosis. Electronically Signed   By: David  [place] M.D.   On: 01/26/2017 11:52   Ct Head Wo Contrast  Result Date: 12/27/2016 CLINICAL DATA:  Fall yesterday striking her head. History of multiple falls. EXAM: CT HEAD WITHOUT CONTRAST TECHNIQUE: Contiguous axial images were obtained from the base of the skull through the vertex without intravenous contrast. COMPARISON:  10/25/2016 FINDINGS: Brain: The brainstem, cerebellum, cerebral peduncles, thalami, basal ganglia, basilar cisterns, and ventricular system appear within normal limits. Periventricular white matter and corona radiata hypodensities favor chronic ischemic microvascular white matter disease. No intracranial hemorrhage, mass lesion, or acute CVA. Vascular: There is atherosclerotic calcification of the cavernous carotid arteries bilaterally. Atherosclerotic calcification of the vertebral arteries. Skull: Hyperostosis frontalis interna. Chronic calcifications along the dura, as on prior exams. Sinuses/Orbits: Unremarkable Other: No supplemental non-categorized findings. IMPRESSION: 1. No acute intracranial findings. 2. Periventricular white matter and corona radiata hypodensities favor chronic ischemic microvascular white matter disease. 3.  Atherosclerosis. Electronically Signed   By: Gaylyn Rong M.D.   On: 12/27/2016 18:02   Ct Knee Right Wo Contrast  Result Date: 12/27/2016 CLINICAL DATA:  Knee pain and swelling post fall with patellar fracture. EXAM: CT OF THE RIGHT KNEE WITHOUT CONTRAST TECHNIQUE: Multidetector CT imaging of the RIGHT knee was performed according to the standard protocol. Multiplanar CT image reconstructions were also generated. COMPARISON:  Knee radiograph 12/27/2016 FINDINGS: Bones/Joint/Cartilage Generalized osteopenia. There is a transverse minimally displaced mildly comminuted fracture of the patella with associated hemorrhagic suprapatellar joint effusion. There is a 3 compartment osteoarthritis, moderate in severity of the right knee joint. No other fractures of seen. There is anterior soft tissue swelling about the knee. Ligaments Suboptimally assessed by CT. Muscles and Tendons Appear intact. Soft tissues Calcific atherosclerotic calcifications noted. IMPRESSION: Generalized osteopenia. Transverse minimally displaced mildly comminuted fracture of the patella with associated hemorrhagic suprapatellar joint effusion. 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 Electronically Signed   By: Ted Mcalpine M.D.   On: 12/27/2016 20:26   US Venous Img Lower Bilateral  Result Date: 01/26/2017 CLINICAL DATA:  76 y/o F; history of right knee fracture with shortness of breath. EXAM: BILATERAL LOWER EXTREMITY VENOUS DOPPLER ULTRASOUND TECHNIQUE: Gray-scale sonography with graded compression, as well as color Doppler and duplex ultrasound were performed to evaluate the lower extremity deep venous systems from the level of the common femoral vein and including the common femoral, femoral, profunda femoral, popliteal and calf veins including the posterior tibial, peroneal and gastrocnemius veins when visible. The superficial great saphenous vein was also interrogated. Spectral Doppler was utilized to  evaluate flow at rest and with distal augmentation maneuvers in the common femoral, femoral and popliteal veins. COMPARISON:  None. FINDINGS: RIGHT LOWER EXTREMITY Common Femoral Vein: No evidence of thrombus. Normal compressibility, respiratory phasicity and response to augmentation. Saphenofemoral Junction: No evidence of thrombus. Normal compressibility and flow on color Doppler imaging. Profunda Femoral Vein: No evidence of thrombus. Normal compressibility and flow on color Doppler imaging. Femoral Vein: No evidence of thrombus. Normal compressibility, respiratory phasicity and response to augmentation. Popliteal Vein: No evidence of thrombus. Normal compressibility, respiratory phasicity and response to augmentation. Calf Veins: Occlusive thrombus of the right peroneal vein. Other calf veins of are patent. Superficial Great Saphenous Vein: No evidence of thrombus. Normal compressibility and flow on color Doppler imaging. Venous Reflux:  None. Other  Findings:  None. LEFT LOWER EXTREMITY Common Femoral Vein: No evidence of thrombus. Normal compressibility, respiratory phasicity and response to augmentation. Saphenofemoral Junction: No evidence of thrombus. Normal compressibility and flow on color Doppler imaging. Profunda Femoral Vein: No evidence of thrombus. Normal compressibility and flow on color Doppler imaging. Femoral Vein: No evidence of thrombus. Normal compressibility, respiratory phasicity and response to augmentation. Popliteal Vein: No evidence of thrombus. Normal compressibility, respiratory phasicity and response to augmentation. Calf Veins: No evidence of thrombus. Normal compressibility and flow on color Doppler imaging. Superficial Great Saphenous Vein: No evidence of thrombus. Normal compressibility and flow on color Doppler imaging. Venous Reflux:  None. Other Findings: Left popliteal fossa complex cyst measuring 4.8 x 1.5 x 2.7 cm, probably a Baker cyst. IMPRESSION: 1. Occlusive thrombus of  the right peroneal vein in the calf. No evidence of additional thrombus of the bilateral lower extremities. 2. Left popliteal fossa complex cyst, probably a Baker cyst. These results will be called to the ordering clinician or representative by the Radiologist Assistant, and communication documented in the PACS or zVision Dashboard. Electronically Signed   By: Mitzi Hansen M.D.   On: 01/26/2017 14:59   Dg Knee 3 View Right  Result Date: 12/27/2016 CLINICAL DATA:  Pain after fall yesterday EXAM: RIGHT KNEE - 3 VIEW COMPARISON:  None. FINDINGS: Lucencies project through the patella on the AP and oblique views, not obvious on the lateral view. Tricompartmental degenerative changes are seen. 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 Vascular calcifications are noted. There is a joint effusion in the suprapatellar space. IMPRESSION: Lucencies through the patella best seen on the AP and oblique view with overlying soft tissue swelling and a joint effusion are concerning for a comminuted patellar fracture. The suspected fracture is not as well seen on the lateral view as typical. Recommend clinical correlation. Electronically Signed   By: Gerome Sam III M.D   On: 12/27/2016 17:25    ECG & Cardiac Imaging    RSR, 94, LAD, IVCD, poor R progression - personally reviewed  Tele - sinus rhythm with significant baseline artifact in the setting of tremor.  No evidence for either VT or Afib.  2D Echocardiogram 4.17.2018  Left ventricle: Wall thickness was increased in a pattern of mild LVH. There was moderate focal basal hypertrophy of the septum.  Systolic function was mildly reduced. The estimated ejection fraction was in the range of 45% to 50%. There is akinesis of the basal-midinferolateral and inferior myocardium. Features are consistent with a pseudonormal left ventricular filling pattern, with concomitant abnormal relaxation and increased filling pressure (grade 2  diastolic dysfunction). - Aortic valve: Moderately thickened, mildly calcified leaflets. Cusp separation was reduced. There was mild to moderate stenosis. There was mild regurgitation. Valve area (VTI): 1.65 cm^2. Valve area (Vmax): 1.44 cm^2. Valve area (Vmean): 1.39 cm^2. - Mitral valve: Calcified annulus. Mildly thickened leaflets .   Mobility of the anterior leaflet was mildly restricted.   Transvalvular velocity was within the normal range. There was no evidence for stenosis. There was mild to moderate regurgitation. - Left atrium: The atrium was mildly dilated. - Right ventricle: The cavity size was mildly dilated. Systolic function was mildly reduced. - Right atrium: The atrium was mildly dilated. - Pericardium, extracardiac: A small pericardial effusion was identified.  Assessment & Plan    1.  Acute Respiratory Failure:  Pt is recently s/p fall and patellar fracture with subsequent immobility.  She has had progressive dyspnea and also  developed mild R>L lower ext swelling.  She presented to the ED this am with profound dyspnea req BiPAP and marked hypertension.  CXR consistent with volume overload.  She has received IV lasix.  LE u/s shows R peroneal vein DVT, thus also raising concern for PE.  She is not a candidate for CTA 2/2 CKD IV.  Echo shows mildly reduced LV and RV fxn.  Only mild TR w/o evidence for PAH.  Agree with IV lasix, heparin, and V:Q scan.  2.  Acute combined systolic/diastolic CHF:  In the setting of marked hypertension.  As above, EF 45-50% with grade 2 DD.  Initial BP was 187/30.  Improving some with diuresis and IV ntg.  Cont  blocker, nitrate, and IV lasix.  Based on wts in Epic, it appears that she is only about 1 or two lbs above prior dry wt.  3.  Hypertensive Urgency:  BP elevated in setting of above.  Cont home meds and follow with diuresis.  4.  Elevated troponin:  Trop 0.94 in setting of above.  No h/o chest pain.  Will have to follow trend to determine if  this a primary event, ie NSTEMI or demand ischemia.  ECG is non-acute. EF 45-50% with akinesis of the basal-midinferolateral and inferior myocardium.  Cont asa/plavix (on chronically),  blocker, statin, and heparin.  She would not be a candidate for cath in setting of CKD IV.  5.  R peroneal vein DVT:  Heparin for now w/ plan to transition to oral anticoagulant.  CrCl calculates to ~ 22.  Eliquis and savaysa may be options as CrCl is > 15, though if renal fxn tenuous, coumadin is likely the better option.  Await V:Q scan.  6.  Parkinsons:  Significant tremor. Cont home meds.  7.  HL:  Cont statin.  8. DM II:  Insulin per IM.  Signed, Nicolasa Ducking, NP 01/26/2017, 4:12 PM

## 2017-01-26 NOTE — ED Provider Notes (Signed)
[facility] Emergency Department Provider Note  ____________________________________________  Time seen: Approximately 11:57 AM  I have reviewed the triage vital signs and the nursing notes.   HISTORY  Chief Complaint Shortness of Breath   HPI Molly Wolf is a 76 y.o. female h/o Parkinson disease, CAD, CKD, diabetes, hypertension, hyperlipidemia who presents for evaluation of shortness of breath. Patient has been in rehabilitation since March 20 and went home yesterday after sustaining a patellar fracture. Patient reports that she has barely gotten up over the course of the last few weeks and spent most of her days in bed. Over the course of the last 3 days she has gotten progressively more short of breath. She also noticed swelling of her lower extremities with the right (side of fracture) greater than left. She reports that her shortness of breath becomes severe with minimal exertion or laying flat. She is requiring multiple pillows to sleep over the course of the last few days. No prior history of CHF. Patient denies chest pain, changes in her chronic cough, fever, chills, abdominal pain, nausea, vomiting. She has never been on oxygen before.  Past Medical History:  Diagnosis Date  . Anxiety   . Arthritis   . CKD (chronic kidney disease), stage IV ([place])   . Closed patellar sleeve fracture of right knee    a. 01/2017 - conservatively managed.  Marland Kitchen COPD (chronic obstructive pulmonary disease) ([place])   . Coronary artery disease    a. s/p remote stenting of unknown vessel @ [facility].  . Depression   . Diabetes mellitus without complication ([place])   . Diastolic dysfunction    a. 01/2008 Echo ([facility]): EF > 55%, mod-sev LVH w/ grade 1 DD, biatrial enlargement, mild AS, trace MR/TR.  Marland Kitchen Hyperlipidemia   . Hypertension   . Parkinson disease [facility])     Patient Active Problem List   Diagnosis Date Noted  . Acute respiratory failure with hypoxia ([place]) 01/26/2017  . Acute  respiratory failure ([place]) 01/26/2017  . Intractable pain 12/27/2016    Past Surgical History:  Procedure Laterality Date  . BACK SURGERY    . CORONARY ANGIOPLASTY WITH STENT PLACEMENT    . NECK SURGERY      Prior to Admission medications   Medication Sig Start Date End Date Taking? Authorizing Provider  acetaminophen (TYLENOL) 325 MG tablet Take 650 mg by mouth every 6 (six) hours as needed for mild pain.   Yes Historical Provider, MD  atorvastatin (LIPITOR) 20 MG tablet Take 20 mg by mouth daily.   Yes Historical Provider, MD  Carbidopa-Levodopa ER (SINEMET CR) 25-100 MG tablet controlled release Take 2 tablets by mouth 4 (four) times daily.   Yes Historical Provider, MD  clopidogrel (PLAVIX) 75 MG tablet Take 75 mg by mouth daily.   Yes Historical Provider, MD  Coenzyme Q10 (COQ10) 50 MG CAPS Take 50 mg by mouth daily.   Yes Historical Provider, MD  CRANBERRY PO Take 1 tablet by mouth daily.   Yes Historical Provider, MD  escitalopram (LEXAPRO) 20 MG tablet Take 20 mg by mouth daily.   Yes Historical Provider, MD  furosemide (LASIX) 20 MG tablet Take 20 mg by mouth daily.    Yes Historical Provider, MD  gabapentin (NEURONTIN) 300 MG capsule Take 300 mg by mouth daily.    Yes Historical Provider, MD  hydrALAZINE (APRESOLINE) 50 MG tablet Take 50 mg by mouth 2 (two) times daily.    Yes Historical Provider, MD  insulin glargine (  LANTUS) 100 UNIT/ML injection Inject 40 Units into the skin at bedtime.    Yes Historical Provider, MD  insulin lispro (HUMALOG) 100 UNIT/ML injection Inject 6 Units into the skin 3 (three) times daily with meals.    Yes Historical Provider, MD  isosorbide mononitrate (IMDUR) 60 MG 24 hr tablet Take 60 mg by mouth daily.   Yes Historical Provider, MD  loratadine (CLARITIN) 10 MG tablet Take 5 mg by mouth daily as needed for allergies.    Yes Historical Provider, MD  LORazepam (ATIVAN) 1 MG tablet Take 1 tablet (1 mg total) by mouth 2 (two) times daily as needed for  anxiety. 12/29/16  Yes Max Sane, MD  metoprolol tartrate (LOPRESSOR) 25 MG tablet Take 12.5 mg by mouth 2 (two) times daily.   Yes Historical Provider, MD  mirabegron ER (MYRBETRIQ) 25 MG TB24 tablet Take 25 mg by mouth daily.   Yes Historical Provider, MD  nitroGLYCERIN (NITROSTAT) 0.4 MG SL tablet Place 0.4 mg under the tongue every 5 (five) minutes as needed for chest pain.   Yes Historical Provider, MD  Omega-3 Fatty Acids (FISH OIL) 1000 MG CAPS Take 1,000 mg by mouth daily.   Yes Historical Provider, MD  polyethylene glycol (MIRALAX / GLYCOLAX) packet Take 17 g by mouth daily as needed for mild constipation.   Yes Historical Provider, MD  promethazine (PHENERGAN) 25 MG tablet Take 25 mg by mouth every 6 (six) hours as needed for nausea or vomiting.   Yes Historical Provider, MD  saccharomyces boulardii (FLORASTOR) 250 MG capsule Take 250 mg by mouth daily.   Yes Historical Provider, MD  traMADol (ULTRAM) 50 MG tablet Take 1 tablet (50 mg total) by mouth daily as needed. 12/29/16  Yes Max Sane, MD  blood glucose meter kit and supplies KIT Dispense based on patient and insurance preference. Use up to four times daily as directed. (FOR ICD-9 250.00, 250.01). 11/06/16   Rudene Re, MD    Allergies Patient has no known allergies.  Family History  Problem Relation Age of Onset  . Cervical cancer Mother   . Kidney failure Father   . CAD Father   . CAD Brother   . Prostate cancer Neg Hx   . Kidney cancer Neg Hx   . Bladder Cancer Neg Hx     Social History Social History  Substance Use Topics  . Smoking status: Never Smoker  . Smokeless tobacco: Never Used  . Alcohol use No    Review of Systems  Constitutional: Negative for fever. Eyes: Negative for visual changes. ENT: Negative for sore throat. Neck: No neck pain  Cardiovascular: Negative for chest pain. + orthopnea Respiratory: + shortness of breath. Gastrointestinal: Negative for abdominal pain, vomiting or  diarrhea. Genitourinary: Negative for dysuria. Musculoskeletal: Negative for back pain. + LE edema Skin: Negative for rash. Neurological: Negative for headaches, weakness or numbness. Psych: No SI or HI  ____________________________________________   PHYSICAL EXAM:  VITAL SIGNS: ED Triage Vitals  Enc Vitals Group     BP 01/26/17 1105 (!) 187/30     Pulse Rate 01/26/17 1105 92     Resp --      Temp 01/26/17 1105 98.2 F (36.8 C)     Temp Source 01/26/17 1105 Axillary     SpO2 01/26/17 1105 95 %     Weight 01/26/17 1107 165 lb (74.8 kg)     Height 01/26/17 1107 5' 6"  (1.676 m)     Head Circumference --  Peak Flow --      Pain Score --      Pain Loc --      Pain Edu? --      Excl. in [place]? --     Constitutional: Alert and oriented, mild respiratory distress.  HEENT:      Head: Normocephalic and atraumatic.         Eyes: Conjunctivae are normal. Sclera is non-icteric. EOMI. PERRL      Mouth/Throat: Mucous membranes are moist.       Neck: Supple with no signs of meningismus. Cardiovascular: Regular rate and rhythm. No murmurs, gallops, or rubs. 2+ symmetrical distal pulses are present in all extremities. No JVD. Respiratory: Increased work of breathing, requiring 2 L nasal cannula for hypoxia in the upper 80s at room air, patient has crackles on the left base, no wheezing and good air movement.  Gastrointestinal: Soft, non tender, and non distended with positive bowel sounds. No rebound or guarding. Musculoskeletal: Pedal edema on the LLE and 2+ pitting edema on the RLE from knee down. Neurologic: Normal speech and language. Face is symmetric. Moving all extremities. No gross focal neurologic deficits are appreciated. Skin: Skin is warm, dry and intact. No rash noted. Psychiatric: Mood and affect are normal. Speech and behavior are normal.  ____________________________________________   LABS (all labs ordered are listed, but only abnormal results are displayed)  Labs  Reviewed  BASIC METABOLIC PANEL - Abnormal; Notable for the following:       Result Value   CO2 21 (*)    Glucose, Bld 276 (*)    BUN 35 (*)    Creatinine, Ser 2.51 (*)    Calcium 8.4 (*)    GFR calc non Af Amer 17 (*)    GFR calc Af Amer 20 (*)    All other components within normal limits  CBC - Abnormal; Notable for the following:    RBC 3.46 (*)    Hemoglobin 10.1 (*)    HCT 30.2 (*)    Platelets 144 (*)    All other components within normal limits  TROPONIN I - Abnormal; Notable for the following:    Troponin I 0.94 (*)    All other components within normal limits  BRAIN NATRIURETIC PEPTIDE - Abnormal; Notable for the following:    B Natriuretic Peptide 2,049.0 (*)    All other components within normal limits  GLUCOSE, CAPILLARY - Abnormal; Notable for the following:    Glucose-Capillary 251 (*)    All other components within normal limits  TROPONIN I - Abnormal; Notable for the following:    Troponin I 2.26 (*)    All other components within normal limits  MAGNESIUM  PROTIME-INR  APTT  TROPONIN I  HEPARIN LEVEL (UNFRACTIONATED)  BASIC METABOLIC PANEL  CBC   ____________________________________________  EKG  ED ECG REPORT I, Rudene Re, the attending physician, personally viewed and interpreted this ECG.  Normal sinus rhythm, rate of 94, incomplete left bundle branch block, normal QTC, left axis deviation, no ST elevations or depressions. No significant changes when compared to prior from 12/27/16  13:43 - V. tach, rate of 126, normal QTC, left axis deviation, no ST elevations or depressions. ____________________________________________  RADIOLOGY  CXR: CHF with mild pulmonary interstitial edema new since the previous study. No acute pneumonia. Thoracic aortic atherosclerosis. ____________________________________________   PROCEDURES  Procedure(s) performed: None Procedures Critical Care performed: yes  CRITICAL CARE Performed by: Rudene Re  ?  Total critical care time:  40 min  Critical care time was exclusive of separately billable procedures and treating other patients.  Critical care was necessary to treat or prevent imminent or life-threatening deterioration.  Critical care was time spent personally by me on the following activities: development of treatment plan with patient and/or surrogate as well as nursing, discussions with consultants, evaluation of patient's response to treatment, examination of patient, obtaining history from patient or surrogate, ordering and performing treatments and interventions, ordering and review of laboratory studies, ordering and review of radiographic studies, pulse oximetry and re-evaluation of patient's condition.  ____________________________________________   INITIAL IMPRESSION / ASSESSMENT AND PLAN / ED COURSE  76 y.o. female h/o Parkinson disease, CAD, CKD, diabetes, hypertension, hyperlipidemia who presents for evaluation of shortness of breath, orthopnea, and asymmetric swelling of b/l LE. Differential diagnoses including CHF exacerbation with a chest x-ray concerning for pulmonary edema, patient with new orthopnea, lower extremity swelling. I'm also concerned about possible PE however patient has chronic kidney disease and unable to undergo a CT of her chest. She does have asymmetric leg swelling and has had decreased mobility over the course of the last 4 weeks. I will send her for Doppler studies at this time. Patent's BP in the 200s, patient has not taken meds this am. We'll put patient on BiPAP at and give her nitroglycerin. Will hold off on Lasix at this time in case patient has a large PE which is preload dependent. If Dopplers are negative but we'll go ahead and give her Lasix. Also we'll hold off duoneb at this time as patient is moving good air and has no wheezing.  Clinical Course as of Jan 27 1944  Tue Jan 26, 2017  1354 Patient noted to have runs of V. tach with  normal mental status and blood pressure. Discussed with Dr. Saunders Revel who recommended IV metoprolol. He also recommended starting patient on anticoagulation for possible PE until studies are done. Will start patient on heparin.   [CV]    Clinical Course User Index [CV] Rudene Re, MD    Pertinent labs & imaging results that were available during my care of the patient were reviewed by me and considered in my medical decision making (see chart for details).    ____________________________________________   FINAL CLINICAL IMPRESSION(S) / ED DIAGNOSES  Final diagnoses:  Acute respiratory failure with hypoxia (HCC)  Acute on chronic congestive heart failure, unspecified heart failure type (HCC)  Non-ST elevation myocardial infarction (NSTEMI) (HCC)  V-tach (HCC)  Shortness of breath      NEW MEDICATIONS STARTED DURING THIS VISIT:  Current Discharge Medication List       Note:  This document was prepared using Dragon voice recognition software and may include unintentional dictation errors.    Rudene Re, MD 01/26/17 1945

## 2017-01-26 NOTE — ED Notes (Signed)
Patient placed on bipap by respiratory.

## 2017-01-27 DIAGNOSIS — R0602 Shortness of breath: Secondary | ICD-10-CM

## 2017-01-27 DIAGNOSIS — I509 Heart failure, unspecified: Secondary | ICD-10-CM

## 2017-01-27 DIAGNOSIS — I82401 Acute embolism and thrombosis of unspecified deep veins of right lower extremity: Secondary | ICD-10-CM

## 2017-01-27 DIAGNOSIS — N189 Chronic kidney disease, unspecified: Secondary | ICD-10-CM

## 2017-01-27 LAB — GLUCOSE, CAPILLARY
Glucose-Capillary: 152 mg/dL — ABNORMAL HIGH (ref 65–99)
Glucose-Capillary: 165 mg/dL — ABNORMAL HIGH (ref 65–99)

## 2017-01-27 LAB — HEPARIN LEVEL (UNFRACTIONATED): HEPARIN UNFRACTIONATED: 0.42 [IU]/mL (ref 0.30–0.70)

## 2017-01-27 LAB — BASIC METABOLIC PANEL
ANION GAP: 6 (ref 5–15)
BUN: 42 mg/dL — AB (ref 6–20)
CALCIUM: 8.2 mg/dL — AB (ref 8.9–10.3)
CO2: 25 mmol/L (ref 22–32)
CREATININE: 2.46 mg/dL — AB (ref 0.44–1.00)
Chloride: 105 mmol/L (ref 101–111)
GFR calc Af Amer: 21 mL/min — ABNORMAL LOW (ref >60)
GFR calc non Af Amer: 18 mL/min — ABNORMAL LOW (ref >60)
GLUCOSE: 180 mg/dL — AB (ref 65–99)
Potassium: 4.5 mmol/L (ref 3.5–5.1)
Sodium: 136 mmol/L (ref 135–145)

## 2017-01-27 LAB — IRON AND TIBC
Iron: 16 ug/dL — ABNORMAL LOW (ref 28–170)
SATURATION RATIOS: 6 % — AB (ref 10.4–31.8)
TIBC: 250 ug/dL (ref 250–450)
UIBC: 234 ug/dL

## 2017-01-27 LAB — CBC
HCT: 26.8 % — ABNORMAL LOW (ref 35.0–47.0)
HEMOGLOBIN: 8.8 g/dL — AB (ref 12.0–16.0)
MCH: 28.6 pg (ref 26.0–34.0)
MCHC: 32.9 g/dL (ref 32.0–36.0)
MCV: 86.8 fL (ref 80.0–100.0)
Platelets: 134 10*3/uL — ABNORMAL LOW (ref 150–440)
RBC: 3.08 MIL/uL — ABNORMAL LOW (ref 3.80–5.20)
RDW: 13.9 % (ref 11.5–14.5)
WBC: 4.5 10*3/uL (ref 3.6–11.0)

## 2017-01-27 NOTE — Progress Notes (Signed)
Brief consult note:  Patient admitted with acute respiratory failure and cardiac issues. In spite of her high risk secondary to recent orthopedic surgery and immobilization duplex ultrasound of the lower extremities demonstrated only tibial vein DVT. VQ scan is low probability for pulmonary embolism.  I do not recommend IVC filter at this time. Given the finding of tibial DVT in the face of her immobilization I do concur with heparinization and a short course of oral anticoagulation after discharge.  Full consult to follow

## 2017-01-27 NOTE — Progress Notes (Signed)
ANTICOAGULATION CONSULT NOTE - Initial Consult  Pharmacy Consult for Heparin Drip  Indication: pulmonary embolus   Pharmacy consulted for heparin drip management for 76 yo female being treated for pulmonary embolus. Patient is currently ordered heparin 1200 units/hr.   Goal of Therapy:  Heparin level 0.3-0.7 units/ml Monitor platelets by anticoagulation protocol: Yes   Plan:  Will continue heparin 1200 units/hr. Will recheck anti-Xa level with am labs.    No Known Allergies  Patient Measurements: Height:  (167.6 cm) Weight: 171 lb 1.6 oz (77.6 kg) IBW/kg (Calculated) : 59.3  Vital Signs: Temp: 97.9 F (36.6 C) (04/18 1247) Temp Source: Oral (04/18 1247) BP: 137/65 (04/18 1247) Pulse Rate: 55 (04/18 1247)  Labs:  Recent Labs  01/26/17 1112 01/26/17 1519 01/26/17 1722 01/26/17 2117 01/26/17 2256 01/27/17 0655  HGB 10.1*  --   --   --   --  8.8*  HCT 30.2*  --   --   --   --  26.8*  PLT 144*  --   --   --   --  134*  APTT  --  30  --   --   --   --   LABPROT  --  14.8  --   --   --   --   INR  --  1.15  --   --   --   --   HEPARINUNFRC  --   --   --   --  0.49 0.42  CREATININE 2.51*  --   --   --   --  2.46*  TROPONINI 0.94*  --  2.26* 2.61*  --   --     Estimated Creatinine Clearance: 20.1 mL/min (A) (by C-G formula based on SCr of 2.46 mg/dL (H)).   Medical History: Past Medical History:  Diagnosis Date  . Anxiety   . Arthritis   . CKD (chronic kidney disease), stage IV (HCC)   . Closed patellar sleeve fracture of right knee    a. 01/2017 - conservatively managed.  Marland Kitchen COPD (chronic obstructive pulmonary disease) (HCC)   . Coronary artery disease    a. s/p remote stenting of unknown vessel @ [facility].  . Depression   . Diabetes mellitus without complication (HCC)   . Diastolic dysfunction    a. 01/2008 Echo ([facility]): EF > 55%, mod-sev LVH w/ grade 1 DD, biatrial enlargement, mild AS, trace MR/TR.  Marland Kitchen Hyperlipidemia   . Hypertension   . Parkinson disease  [facility])     Pharmacy will continue to monitor and adjust per consult.   [name],  01/27/2017 3:23 PM

## 2017-01-27 NOTE — Progress Notes (Signed)
[employer] [facility] [place] [facility]   PATIENT NAME: Molly Wolf    MR#:  [id number]  DATE OF BIRTH:  11/07/1940  SUBJECTIVE:  CHIEF COMPLAINT:   Chief Complaint  Patient presents with  . Shortness of Breath   Recent admission with patellar fracture, sent to rehab with cast- came with SOB.  now found to have DVT. Due to renal failure- only VQ scn is done- reported low probablity for PE.  REVIEW OF SYSTEMS:  CONSTITUTIONAL: No fever,positive for fatigue or weakness.  EYES: No blurred or double vision.  EARS, NOSE, AND THROAT: No tinnitus or ear pain.  RESPIRATORY: No cough, positive for shortness of breath,no wheezing ,no hemoptysis.  CARDIOVASCULAR: No chest pain, orthopnea, edema.  GASTROINTESTINAL: No nausea, vomiting, diarrhea or abdominal pain.  GENITOURINARY: No dysuria, hematuria.  ENDOCRINE: No polyuria, nocturia,  HEMATOLOGY: No anemia, easy bruising or bleeding SKIN: No rash or lesion. MUSCULOSKELETAL: No joint pain or arthritis.   NEUROLOGIC: No tingling, numbness, weakness.  PSYCHIATRY: No anxiety or depression.   ROS  DRUG ALLERGIES:  No Known Allergies  VITALS:  Blood pressure (!) 149/67, pulse 66, temperature 98.5 F (36.9 C), temperature source Oral, resp. rate 18, height  (1.676 m), weight 77.6 kg (171 lb 1.6 oz), SpO2 98 %.  PHYSICAL EXAMINATION:  GENERAL:  76 y.o.-year-old patient lying in the bed with no acute distress.  EYES: Pupils equal, round, reactive to light and accommodation. No scleral icterus. Extraocular muscles intact.  HEENT: Head atraumatic, normocephalic. Oropharynx and nasopharynx clear.  NECK:  Supple, no jugular venous distention. No thyroid enlargement, no tenderness.  LUNGS: Normal breath sounds bilaterally, no wheezing, rales,rhonchi or crepitation. No use of accessory muscles of respiration.  CARDIOVASCULAR: S1, S2 normal. No murmurs, rubs, or gallops.  ABDOMEN: Soft, nontender, nondistended. Bowel sounds  present. No organomegaly or mass.  EXTREMITIES: No pedal edema, cyanosis, or clubbing.  NEUROLOGIC: Cranial nerves II through XII are intact. Muscle strength 3-4/5 in all extremities. Sensation intact. Gait not checked.  PSYCHIATRIC: The patient is alert and oriented x 3.  SKIN: No obvious rash, lesion, or ulcer.   Physical Exam LABORATORY PANEL:   CBC  Recent Labs Lab 01/27/17 0655  WBC 4.5  HGB 8.8*  HCT 26.8*  PLT 134*   ------------------------------------------------------------------------------------------------------------------  Chemistries   Recent Labs Lab 01/26/17 1112 01/27/17 0655  NA 137 136  K 4.8 4.5  CL 103 105  CO2 21* 25  GLUCOSE 276* 180*  BUN 35* 42*  CREATININE 2.51* 2.46*  CALCIUM 8.4* 8.2*  MG 1.7  --    ------------------------------------------------------------------------------------------------------------------  Cardiac Enzymes  Recent Labs Lab 01/26/17 1722 01/26/17 2117  TROPONINI 2.26* 2.61*   ------------------------------------------------------------------------------------------------------------------  RADIOLOGY:  Dg Chest 2 View  Result Date: 01/26/2017 CLINICAL DATA:  Shortness of breath since yesterday, productive cough, orthopnea. History of diabetes and coronary artery disease with angioplasty and stent placement. Nonsmoker. EXAM: CHEST  2 VIEW COMPARISON:  Chest x-ray of November 06, 2016 FINDINGS: The lungs are well-expanded. The pulmonary vascularity is engorged. The interstitial markings are mildly increased bilaterally. The cardiac silhouette is enlarged. There are small bilateral pleural effusions layering posteriorly. There is calcification in the wall of the aortic arch. There is multilevel degenerative disc disease of the thoracic spine. IMPRESSION: CHF with mild pulmonary interstitial edema new since the previous study. No acute pneumonia. Thoracic aortic atherosclerosis. Electronically Signed   By: David  [place]  M.D.   On: 01/26/2017 11:52   Nm Pulmonary  Perf And Vent  Result Date: 01/26/2017 CLINICAL DATA:  Shortness of breath EXAM: NUCLEAR MEDICINE VENTILATION - PERFUSION LUNG SCAN TECHNIQUE: Ventilation images were obtained in multiple projections using inhaled aerosol Tc-40m DTPA. Perfusion images were obtained in multiple projections after intravenous injection of Tc-26m MAA. RADIOPHARMACEUTICALS:  32.1 mCi Technetium-46m DTPA aerosol inhalation and 3.5 mCi Technetium-60m MAA IV COMPARISON:  Chest radiograph earlier today. FINDINGS: Ventilation: No focal ventilation defect. Perfusion: No wedge shaped peripheral perfusion defects to suggest acute pulmonary embolism. IMPRESSION: Low probability pulmonary embolus. Electronically Signed   By: Elsie Stain M.D.   On: 01/26/2017 17:02   US Venous Img Lower Bilateral  Result Date: 01/26/2017 CLINICAL DATA:  76 y/o F; history of right knee fracture with shortness of breath. EXAM: BILATERAL LOWER EXTREMITY VENOUS DOPPLER ULTRASOUND TECHNIQUE: Gray-scale sonography with graded compression, as well as color Doppler and duplex ultrasound were performed to evaluate the lower extremity deep venous systems from the level of the common femoral vein and including the common femoral, femoral, profunda femoral, popliteal and calf veins including the posterior tibial, peroneal and gastrocnemius veins when visible. The superficial great saphenous vein was also interrogated. Spectral Doppler was utilized to evaluate flow at rest and with distal augmentation maneuvers in the common femoral, femoral and popliteal veins. COMPARISON:  None. FINDINGS: RIGHT LOWER EXTREMITY Common Femoral Vein: No evidence of thrombus. Normal compressibility, respiratory phasicity and response to augmentation. Saphenofemoral Junction: No evidence of thrombus. Normal compressibility and flow on color Doppler imaging. Profunda Femoral Vein: No evidence of thrombus. Normal compressibility and flow on color  Doppler imaging. Femoral Vein: No evidence of thrombus. Normal compressibility, respiratory phasicity and response to augmentation. Popliteal Vein: No evidence of thrombus. Normal compressibility, respiratory phasicity and response to augmentation. Calf Veins: Occlusive thrombus of the right peroneal vein. Other calf veins of are patent. Superficial Great Saphenous Vein: No evidence of thrombus. Normal compressibility and flow on color Doppler imaging. Venous Reflux:  None. Other Findings:  None. LEFT LOWER EXTREMITY Common Femoral Vein: No evidence of thrombus. Normal compressibility, respiratory phasicity and response to augmentation. Saphenofemoral Junction: No evidence of thrombus. Normal compressibility and flow on color Doppler imaging. Profunda Femoral Vein: No evidence of thrombus. Normal compressibility and flow on color Doppler imaging. Femoral Vein: No evidence of thrombus. Normal compressibility, respiratory phasicity and response to augmentation. Popliteal Vein: No evidence of thrombus. Normal compressibility, respiratory phasicity and response to augmentation. Calf Veins: No evidence of thrombus. Normal compressibility and flow on color Doppler imaging. Superficial Great Saphenous Vein: No evidence of thrombus. Normal compressibility and flow on color Doppler imaging. Venous Reflux:  None. Other Findings: Left popliteal fossa complex cyst measuring 4.8 x 1.5 x 2.7 cm, probably a Baker cyst. IMPRESSION: 1. Occlusive thrombus of the right peroneal vein in the calf. No evidence of additional thrombus of the bilateral lower extremities. 2. Left popliteal fossa complex cyst, probably a Baker cyst. These results will be called to the ordering clinician or representative by the Radiologist Assistant, and communication documented in the PACS or zVision Dashboard. Electronically Signed   By: Mitzi Hansen M.D.   On: 01/26/2017 14:59    ASSESSMENT AND PLAN:   Active Problems:   Acute respiratory  failure with hypoxia (HCC)   Acute respiratory failure (HCC)  1.  Acute hypoxic respiratory failure requiring BiPAP on presentation     Likely CHF, improved - now on nasal canula oxygen.   Appreciated help by Dr. Belia Heman. 2. Acute CHF- combined systolic + diastolic .  Echocardiogram reviewed.  Initially placed on nitro drip. Metoprolol to be continued.   Improved now. 3. Ruled out pulmonary embolism with VQ scan     DVT positive- per doppler.   Heparin IV drip.   Pt had recent patellar fracture with brace, sent to rehab. 4. Elevated troponin. Could be demand ischemia from acute respiratory failure and acute CHF.  And there is renal failure.   Troponin went > 2.5   Called cardiology consult.  Heparin drip aspirin and Plavix. Patient does have a history of coronary artery disease 4.  atrial flutter. Give IV magnesium. Continue oral metoprolol 5. Type 2 diabetes mellitus on sliding scale and Lantus insulin 6. Parkinson's disease continue Sinemet 7. History of COPD put on nebulizer treatments 8. Essential hypertension continue usual medications for now 9. Chronic kidney disease stage III monitor with diuresis 10. Right patella fracture history 11. Hyperlipidemia unspecified continue statin   All the records are reviewed and case discussed with Care Management/Social Workerr. Management plans discussed with the patient, family and they are in agreement.  CODE STATUS: Full.  TOTAL TIME TAKING CARE OF THIS PATIENT: 35 minutes.   POSSIBLE D/C IN 1-2 DAYS, DEPENDING ON CLINICAL CONDITION.   Altamese Dilling M.D on 01/27/2017   Between 7am to 6pm - Pager - [phone]  After 6pm go to [email or web address] - password [email or web address]  [facility]  Office  [phone]  CC: Primary care physician; Darliss Ridgel, MD  Note: This dictation was prepared with Dragon dictation along with smaller phrase technology. Any transcriptional errors that result from this process are  unintentional.

## 2017-01-27 NOTE — Progress Notes (Signed)
Name: Molly Wolf MRN: [id number] DOB: 1941/09/27    ADMISSION DATE:  01/26/2017 CONSULTATION DATE:  01/26/17  REFERRING MD : Dr. Renae Gloss  CHIEF COMPLAINT:  Acute Respiratory Distress  BRIEF PATIENT DESCRIPTION:  76 yo female admitted 04/17 with acute on chronic respiratory failure secondary to acute CHF exacerbation requiring continuous Bipap and hypertensive urgency requiring Nitro gtt   SIGNIFICANT EVENTS  4/17-Patient admitted to [facility] with Acute Shortness of breath related to CHF Exacerbation  STUDIES:  01/26/17 ECHO>>Wall thickness was increased in a pattern of mildLVH.   There was moderate focal basal hypertrophy of the septum. Systolic function was mildly reduced. The estimated ejectionfraction was in the range of 45% to 50% 01/26/17 Vascular Ultrasound>>Occlusive thrombus of the right peroneal vein in the calf  REVIEW OF SYSTEMS: Positives in BOLD Constitutional: Negative for fever, chills, weight loss, malaise/fatigue and diaphoresis.  HENT: Negative for hearing loss, ear pain, nosebleeds, congestion, sore throat, neck pain, tinnitus and ear discharge.   Eyes: Negative for blurred vision, double vision, photophobia, pain, discharge and redness.  Respiratory: Negative for cough, hemoptysis, sputum production, shortness of breath, wheezing and stridor.   Cardiovascular: Negative for chest pain, palpitations, orthopnea, claudication, leg swelling and PND.  Gastrointestinal: Negative for heartburn, nausea, vomiting, abdominal pain, diarrhea, constipation, blood in stool and melena.  Genitourinary: Negative for dysuria, urgency, frequency, hematuria and flank pain.  Musculoskeletal: Negative for myalgias, back pain, joint pain and falls.  Skin: Negative for itching and rash.  Neurological: dizziness, tingling, chronic tremors, sensory change, speech change, focal weakness, seizures, loss of consciousness, weakness and headaches.  Endo/Heme/Allergies: Negative for environmental  allergies and polydipsia. Does not bruise/bleed easily.  SUBJECTIVE:  Pt sitting up in chair states her breathing has improved significantly she is currently on 2L O2 via nasal canula   VITAL SIGNS: Temp:  [97.9 F (36.6 C)-98.4 F (36.9 C)] 98.2 F (36.8 C) (04/18 0439) Pulse Rate:  [52-88] 64 (04/18 0439) Resp:  [14-30] 16 (04/18 0439) BP: (141-200)/(75-120) 157/90 (04/18 0439) SpO2:  [93 %-100 %] 100 % (04/18 0439) Weight:  [77.6 kg (171 lb 1.6 oz)] 77.6 kg (171 lb 1.6 oz) (04/17 1737)  PHYSICAL EXAMINATION: General:  Elderly Caucasian female, with generalized resting tremors  Neuro:  Awake, alert, oriented, follows commands  HEENT:  Supple, no JVD Cardiovascular: NSR, nsr, s1s2, +murmur Lungs:  Diminished bibasilar, no wheezes, crackles, rhonchi noted, even, non labored  Abdomen:  Soft, ND,NT, +BS x4 Musculoskeletal:  No edema, normal bulk and tone  Skin:  Warm, dry and intact   Recent Labs Lab 01/26/17 1112 01/27/17 0655  NA 137 136  K 4.8 4.5  CL 103 105  CO2 21* 25  BUN 35* 42*  CREATININE 2.51* 2.46*  GLUCOSE 276* 180*    Recent Labs Lab 01/26/17 1112 01/27/17 0655  HGB 10.1* 8.8*  HCT 30.2* 26.8*  WBC 6.0 4.5  PLT 144* 134*   Dg Chest 2 View  Result Date: 01/26/2017 CLINICAL DATA:  Shortness of breath since yesterday, productive cough, orthopnea. History of diabetes and coronary artery disease with angioplasty and stent placement. Nonsmoker. EXAM: CHEST  2 VIEW COMPARISON:  Chest x-ray of November 06, 2016 FINDINGS: The lungs are well-expanded. The pulmonary vascularity is engorged. The interstitial markings are mildly increased bilaterally. The cardiac silhouette is enlarged. There are small bilateral pleural effusions layering posteriorly. There is calcification in the wall of the aortic arch. There is multilevel degenerative disc disease of the thoracic spine. IMPRESSION: CHF with mild  pulmonary interstitial edema new since the previous study. No acute  pneumonia. Thoracic aortic atherosclerosis. Electronically Signed   By: Rei Medlen  [place] M.D.   On: 01/26/2017 11:52   Nm Pulmonary Perf And Vent  Result Date: 01/26/2017 CLINICAL DATA:  Shortness of breath EXAM: NUCLEAR MEDICINE VENTILATION - PERFUSION LUNG SCAN TECHNIQUE: Ventilation images were obtained in multiple projections using inhaled aerosol Tc-29m DTPA. Perfusion images were obtained in multiple projections after intravenous injection of Tc-22m MAA. RADIOPHARMACEUTICALS:  32.1 mCi Technetium-80m DTPA aerosol inhalation and 3.5 mCi Technetium-30m MAA IV COMPARISON:  Chest radiograph earlier today. FINDINGS: Ventilation: No focal ventilation defect. Perfusion: No wedge shaped peripheral perfusion defects to suggest acute pulmonary embolism. IMPRESSION: Low probability pulmonary embolus. Electronically Signed   By: Elsie Stain M.D.   On: 01/26/2017 17:02   US Venous Img Lower Bilateral  Result Date: 01/26/2017 CLINICAL DATA:  76 y/o F; history of right knee fracture with shortness of breath. EXAM: BILATERAL LOWER EXTREMITY VENOUS DOPPLER ULTRASOUND TECHNIQUE: Gray-scale sonography with graded compression, as well as color Doppler and duplex ultrasound were performed to evaluate the lower extremity deep venous systems from the level of the common femoral vein and including the common femoral, femoral, profunda femoral, popliteal and calf veins including the posterior tibial, peroneal and gastrocnemius veins when visible. The superficial great saphenous vein was also interrogated. Spectral Doppler was utilized to evaluate flow at rest and with distal augmentation maneuvers in the common femoral, femoral and popliteal veins. COMPARISON:  None. FINDINGS: RIGHT LOWER EXTREMITY Common Femoral Vein: No evidence of thrombus. Normal compressibility, respiratory phasicity and response to augmentation. Saphenofemoral Junction: No evidence of thrombus. Normal compressibility and flow on color Doppler imaging.  Profunda Femoral Vein: No evidence of thrombus. Normal compressibility and flow on color Doppler imaging. Femoral Vein: No evidence of thrombus. Normal compressibility, respiratory phasicity and response to augmentation. Popliteal Vein: No evidence of thrombus. Normal compressibility, respiratory phasicity and response to augmentation. Calf Veins: Occlusive thrombus of the right peroneal vein. Other calf veins of are patent. Superficial Great Saphenous Vein: No evidence of thrombus. Normal compressibility and flow on color Doppler imaging. Venous Reflux:  None. Other Findings:  None. LEFT LOWER EXTREMITY Common Femoral Vein: No evidence of thrombus. Normal compressibility, respiratory phasicity and response to augmentation. Saphenofemoral Junction: No evidence of thrombus. Normal compressibility and flow on color Doppler imaging. Profunda Femoral Vein: No evidence of thrombus. Normal compressibility and flow on color Doppler imaging. Femoral Vein: No evidence of thrombus. Normal compressibility, respiratory phasicity and response to augmentation. Popliteal Vein: No evidence of thrombus. Normal compressibility, respiratory phasicity and response to augmentation. Calf Veins: No evidence of thrombus. Normal compressibility and flow on color Doppler imaging. Superficial Great Saphenous Vein: No evidence of thrombus. Normal compressibility and flow on color Doppler imaging. Venous Reflux:  None. Other Findings: Left popliteal fossa complex cyst measuring 4.8 x 1.5 x 2.7 cm, probably a Baker cyst. IMPRESSION: 1. Occlusive thrombus of the right peroneal vein in the calf. No evidence of additional thrombus of the bilateral lower extremities. 2. Left popliteal fossa complex cyst, probably a Baker cyst. These results will be called to the ordering clinician or representative by the Radiologist Assistant, and communication documented in the PACS or zVision Dashboard. Electronically Signed   By: Mitzi Hansen M.D.    On: 01/26/2017 14:59    ASSESSMENT / PLAN: Cardiorenal Syndrome Acute on Chronic Hypoxic Respiratory Failure secondary to acute diastolic and systolic CHF exacerbation-improving   CKD Stage IV Occlusive thrombus  of the right peroneal vein in the calf Elevated Troponin secondary NSTEMI Hypertensive urgency-improving  P: Supplemental O2 to maintain O2 sats >92% or for dyspnea  Continue heparin gtt for now per Cardiology recommendations pt will need to be transitioned to oral anticoagulant prior to discharge  Vascular Surgery consulted for evaluation of need for IVC filter placement  Continue IV lasix dosing per Cardiology recommendations  Per Cardiology pt poor candidate for cardiac cath due to CKD IV recommending possible outpatient Myoview  Continue scheduled aspirin, imdur, metoprolol-cardiology may switch to carvedilol, hydralazine, plavix, and lipitor  Trend BMP's  Replace electrolytes as indicated Monitor UOP   PCCM will sign off today 01/27/2017 pt is stable from a respiratory standpoint if additional assistance needed please feel free to call on call pager.    Sonda Rumble, AGNP  Pulmonary/Critical Care Pager [phone] (please enter 7 digits) PCCM Consult Pager [phone] (please enter 7 digits)   Billy Fischer, MD PCCM service Mobile [phone] Pager [phone] 01/28/2017

## 2017-01-27 NOTE — Care Management (Signed)
patient discharge from [facility] to home 4/16 and patient says home health was set up but not sure with whom.  Contacted [facility] and agency does not have a referral for patient.  left message with facility social worker to obtain name of agency that has referral.  Patient with shortness of breath initiallyrequiring 02 which is acute. She has a walker and lives with her grandson.  Recently moved to [place] from [place] and has not obtained a local PCP. Continues to see her PCP in [place]. she says she would not want to consider returning to skilled nursing- would prefer to return home. Being ruled out for pulmonary embolus. Elevated troponins. Cardiology consulting

## 2017-01-27 NOTE — Progress Notes (Signed)
Patient complaining that she hasnt had BM in a few days.  Has tried several times.  Given miralax per request.  Continue to monitor.

## 2017-01-27 NOTE — Progress Notes (Signed)
Inpatient Diabetes Program Recommendations  AACE/ADA: New Consensus Statement on Inpatient Glycemic Control (2015)  Target Ranges:  Prepandial:   less than 140 mg/dL      Peak postprandial:   less than 180 mg/dL (1-2 hours)      Critically ill patients:  140 - 180 mg/dL   Results for [name], [name] (MRN [id number]) as of 01/27/2017 10:10  Ref. Range 01/26/2017 14:13 01/26/2017 21:42 01/27/2017 08:06  Glucose-Capillary Latest Ref Range: 65 - 99 mg/dL 409 (H) 811 (H) 914 (H)    Admit with: SOB  History: DM  Home DM Meds: Lantus 40 units QHS       Humalog 6 units TID with meals  Current Insulin Orders: Lantus 40 units QHS     MD- Please consider starting Novolog Moderate Correction Scale/ SSI (0-15 units) TID AC + HS      --Will follow patient during hospitalization--  Ambrose Finland RN, MSN, CDE Diabetes Coordinator Inpatient Glycemic Control Team Team Pager: [phone] (8a-5p)

## 2017-01-27 NOTE — Progress Notes (Addendum)
Progress Note  Patient Name: Molly Wolf Date of Encounter: 01/27/2017  Primary Cardiologist: previously followed by Dr. Jarvis Newcomer in [place] - plans to change care to [facility] - C. End, MD   Subjective   Breathing much improved.  No chest pain.   Inpatient Medications    Scheduled Meds: . aspirin EC  81 mg Oral Daily  . atorvastatin  20 mg Oral Daily  . Carbidopa-Levodopa ER  2 tablet Oral QID  . clopidogrel  75 mg Oral Daily  . escitalopram  20 mg Oral Daily  . furosemide  40 mg Intravenous Once  . furosemide  80 mg Intravenous BID  . gabapentin  300 mg Oral Daily  . hydrALAZINE  50 mg Oral BID  . insulin glargine  40 Units Subcutaneous QHS  . metoprolol tartrate  12.5 mg Oral BID  . mirabegron ER  25 mg Oral Daily  . saccharomyces boulardii  250 mg Oral Daily  . sodium chloride flush  3 mL Intravenous Q12H   Continuous Infusions: . heparin 1,200 Units/hr (01/26/17 1522)   PRN Meds: acetaminophen **OR** acetaminophen, loratadine, LORazepam, nitroGLYCERIN, ondansetron (ZOFRAN) IV, polyethylene glycol, traMADol   Vital Signs    Vitals:   01/26/17 1737 01/26/17 1952 01/26/17 2307 01/27/17 0439  BP: (!) 164/91 (!) 158/89  (!) 157/90  Pulse: 69 (!) 52 65 64  Resp: Temp: 97.9 F (36.6 C) 98.4 F (36.9 C)  98.2 F (36.8 C)  TempSrc:  Oral  Oral  SpO2: 93% 97%  100%  Weight: 171 lb 1.6 oz (77.6 kg)     Height:        Intake/Output Summary (Last 24 hours) at 01/27/17 0914 Last data filed at 01/27/17 0442  Gross per 24 hour  Intake              210 ml  Output              450 ml  Net             -240 ml   Filed Weights   01/26/17 1107 01/26/17 1737  Weight: 165 lb (74.8 kg) 171 lb 1.6 oz (77.6 kg)    Physical Exam   GEN: Well nourished, well developed, in no acute distress.  HEENT: Grossly normal.  Neck: Supple, JVP ~ 12 cm, no carotid bruits, or masses. Cardiac: RRR, 2/6 SEM RUSB, no rubs/gallops. No clubbing, cyanosis, edema.   Radials/DP/PT 2+ and equal bilaterally.  Respiratory:  Respirations regular and unlabored, bibasilar crackles. GI: Soft, nontender, nondistended, BS + x 4. MS: no deformity or atrophy. Skin: warm and dry, no rash. Neuro:  Strength and sensation are intact.  Tremor. Psych: AAOx3.  Normal affect.  Labs    Chemistry Recent Labs Lab 01/26/17 1112 01/27/17 0655  NA 137 136  K 4.8 4.5  CL 103 105  CO2 21* 25  GLUCOSE 276* 180*  BUN 35* 42*  CREATININE 2.51* 2.46*  CALCIUM 8.4* 8.2*  GFRNONAA 17* 18*  GFRAA 20* 21*  ANIONGAP 13 6     Hematology Recent Labs Lab 01/26/17 1112 01/27/17 0655  WBC 6.0 4.5  RBC 3.46* 3.08*  HGB 10.1* 8.8*  HCT 30.2* 26.8*  MCV 87.3 86.8  MCH 29.3 28.6  MCHC 33.6 32.9  RDW 13.7 13.9  PLT 144* 134*    Cardiac Enzymes Recent Labs Lab 01/26/17 1112 01/26/17 1722 01/26/17 2117  TROPONINI 0.94* 2.26* 2.61*     BNP Recent Labs  Lab 01/26/17 1112  BNP 2,049.0*    Radiology    Dg Chest 2 View  Result Date: 01/26/2017 CLINICAL DATA:  Shortness of breath since yesterday, productive cough, orthopnea. History of diabetes and coronary artery disease with angioplasty and stent placement. Nonsmoker. EXAM: CHEST  2 VIEW COMPARISON:  Chest x-ray of November 06, 2016 FINDINGS: The lungs are well-expanded. The pulmonary vascularity is engorged. The interstitial markings are mildly increased bilaterally. The cardiac silhouette is enlarged. There are small bilateral pleural effusions layering posteriorly. There is calcification in the wall of the aortic arch. There is multilevel degenerative disc disease of the thoracic spine. IMPRESSION: CHF with mild pulmonary interstitial edema new since the previous study. No acute pneumonia. Thoracic aortic atherosclerosis. Electronically Signed   By: David  [place] M.D.   On: 01/26/2017 11:52   Nm Pulmonary Perf And Vent  Result Date: 01/26/2017 CLINICAL DATA:  Shortness of breath EXAM: NUCLEAR MEDICINE VENTILATION  - PERFUSION LUNG SCAN TECHNIQUE: Ventilation images were obtained in multiple projections using inhaled aerosol Tc-52m DTPA. Perfusion images were obtained in multiple projections after intravenous injection of Tc-69m MAA. RADIOPHARMACEUTICALS:  32.1 mCi Technetium-30m DTPA aerosol inhalation and 3.5 mCi Technetium-55m MAA IV COMPARISON:  Chest radiograph earlier today. FINDINGS: Ventilation: No focal ventilation defect. Perfusion: No wedge shaped peripheral perfusion defects to suggest acute pulmonary embolism. IMPRESSION: Low probability pulmonary embolus. Electronically Signed   By: Elsie Stain M.D.   On: 01/26/2017 17:02   US Venous Img Lower Bilateral  Result Date: 01/26/2017 CLINICAL DATA:  76 y/o F; history of right knee fracture with shortness of breath. EXAM: BILATERAL LOWER EXTREMITY VENOUS DOPPLER ULTRASOUND TECHNIQUE: Gray-scale sonography with graded compression, as well as color Doppler and duplex ultrasound were performed to evaluate the lower extremity deep venous systems from the level of the common femoral vein and including the common femoral, femoral, profunda femoral, popliteal and calf veins including the posterior tibial, peroneal and gastrocnemius veins when visible. The superficial great saphenous vein was also interrogated. Spectral Doppler was utilized to evaluate flow at rest and with distal augmentation maneuvers in the common femoral, femoral and popliteal veins. COMPARISON:  None. FINDINGS: RIGHT LOWER EXTREMITY Common Femoral Vein: No evidence of thrombus. Normal compressibility, respiratory phasicity and response to augmentation. Saphenofemoral Junction: No evidence of thrombus. Normal compressibility and flow on color Doppler imaging. Profunda Femoral Vein: No evidence of thrombus. Normal compressibility and flow on color Doppler imaging. Femoral Vein: No evidence of thrombus. Normal compressibility, respiratory phasicity and response to augmentation. Popliteal Vein: No  evidence of thrombus. Normal compressibility, respiratory phasicity and response to augmentation. Calf Veins: Occlusive thrombus of the right peroneal vein. Other calf veins of are patent. Superficial Great Saphenous Vein: No evidence of thrombus. Normal compressibility and flow on color Doppler imaging. Venous Reflux:  None. Other Findings:  None. LEFT LOWER EXTREMITY Common Femoral Vein: No evidence of thrombus. Normal compressibility, respiratory phasicity and response to augmentation. Saphenofemoral Junction: No evidence of thrombus. Normal compressibility and flow on color Doppler imaging. Profunda Femoral Vein: No evidence of thrombus. Normal compressibility and flow on color Doppler imaging. Femoral Vein: No evidence of thrombus. Normal compressibility, respiratory phasicity and response to augmentation. Popliteal Vein: No evidence of thrombus. Normal compressibility, respiratory phasicity and response to augmentation. Calf Veins: No evidence of thrombus. Normal compressibility and flow on color Doppler imaging. Superficial Great Saphenous Vein: No evidence of thrombus. Normal compressibility and flow on color Doppler imaging. Venous Reflux:  None. Other Findings: Left popliteal fossa complex  cyst measuring 4.8 x 1.5 x 2.7 cm, probably a Baker cyst. IMPRESSION: 1. Occlusive thrombus of the right peroneal vein in the calf. No evidence of additional thrombus of the bilateral lower extremities. 2. Left popliteal fossa complex cyst, probably a Baker cyst. These results will be called to the ordering clinician or representative by the Radiologist Assistant, and communication documented in the PACS or zVision Dashboard. Electronically Signed   By: Mitzi Hansen M.D.   On: 01/26/2017 14:59    Telemetry    rsr - Personally Reviewed   Cardiac Studies   2D Echocardiogram 4.17.2018  Left ventricle: Wall thickness was increased in a pattern of mild   LVH. There was moderate focal basal hypertrophy  of the septum.   Systolic function was mildly reduced. The estimated ejection   fraction was in the range of 45% to 50%. There is akinesis of the   basal-midinferolateral and inferior myocardium. Features are   consistent with a pseudonormal left ventricular filling pattern,   with concomitant abnormal relaxation and increased filling   pressure (grade 2 diastolic dysfunction). - Aortic valve: Moderately thickened, mildly calcified leaflets.   Cusp separation was reduced. There was mild to moderate stenosis.   There was mild regurgitation. Valve area (VTI): 1.65 cm^2. Valve   area (Vmax): 1.44 cm^2. Valve area (Vmean): 1.39 cm^2. - Mitral valve: Calcified annulus. Mildly thickened leaflets .   Mobility of the anterior leaflet was mildly restricted.   Transvalvular velocity was within the normal range. There was no   evidence for stenosis. There was mild to moderate regurgitation. - Left atrium: The atrium was mildly dilated. - Right ventricle: The cavity size was mildly dilated. Systolic   function was mildly reduced. - Right atrium: The atrium was mildly dilated. - Pericardium, extracardiac: A small pericardial effusion was   identified.   Patient Profile     76 y.o. female with a history of CAD s/p stenting, CKD IV, Parkinsons, HTN, HL, DM, and COPD admitted to [facility] on 4/17 with progressive dyspnea/resp failure, acute combined CHF, elevated troponin, and RLE DVT.  V:Q low prob for PE.  Assessment & Plan    1.  Acute respiratory failure with hypoxia:  In setting of hypertensive urgency and CHF.  V:Q low prob for PE.  Initially req BiPAP.  Now on [place]. Feeling much better.  2.  Acute combined systolic and diastolic CHF:  In setting of hypertensive urgency.  Pt now able to convey that she was previously on lasix but this was discontinued while she was in rehab.  Other adjustments were made to bp meds as well.  She has had progressive dyspnea over the past week, and also lower ext  swelling.  Treated with IV lasix yesterday.  Minus 240 ml overnight.  Wt pending this am.  Still w/ crackles on exam.  JVP ~ 12.  Creat improved w/ diuresis.  EF 45-50% w/ grade2 DD.  Cont diuresis today.  Will need to cont to work on BP - trending 150's - 160's.  Will switch metoprolol to carvedilol and add long acting nitrate.  3.  Hypertensive Urgency:  As above, recent changes to bp meds and discontinuation of diuretic @ rehab.  BP 150's to 160's.  Change metoprolol to coreg.  Cont hydralazine and add long acting nitrate for afterload reduction.  4.  CAD/NSTEMI:  s/p prior PCI of unknown vessel -3-4 yrs ago in [facility].  She initially was eval @ [facility] and told med rx was  best option but then changed cardiologists (now Dr. Jarvis Newcomer in [place]) and then underwent PCI.  She has not had any c/p recently but Troponin 0.94  2.26  2.61 in setting of resp failure/htn urgency/chf.  Echo showed EF of 45-50%.  She is a poor candidate for cath 2/2 CKD IV.  Cont med rx w/ plavix,  blocker, statin, adding nitrate.  Cont heparin for another 24 hrs.  With need for oral anticoagulation, will likely d/c ASA once it is initiated.  She will be transitioning her cardiology care to [facility], and we can arrange for outpt f/u with Dr. Okey Dupre @ which point we can consider myoview to assess for high risk features.  5.   R peroneal vein DVT:  In setting of recent R patellar fx and immobility.  V:Q scan neg for PE.  Cont heparin for now.  As there is no plan for invasive eval, prob ok to initiate coumadin.  CKD IV makes DOAC less ideal.  Plan to d/c asa once starting OAC.  6.  Parkinsons:  Cont home meds.  7. HL: Cont statin.  8.  DM II:  Insulin per IM.  9.  Normocytic anemia:  h/h drifting down.  Check iron studies and guaiac stools prior to committing to [facility].  Signed, Nicolasa Ducking, NP  01/27/2017, 9:14 AM     Attending Note Patient seen and examined, agree with detailed note above,  Patient presentation and  plan discussed on rounds.   Improved shortness of breath and leg edema Feels close to her baseline Waxing waning constipation Reports she is going back home after she leaves the hospital, completed rehabilitation at [facility] Notes indicating Lasix was held during rehabilitation with worsening shortness of breath and leg swelling  Also review of lab work shows anemia in the past 3 months Iron level of 16 She does report poor by mouth intake for the past month, did not like the food at [employer]  On physical exam lungs with scant crackles at the bases, JVP 12+, heart sounds regular with no murmurs appreciated, abdomen soft nontender, no significant lower extremity edema  Lab work reviewed showing hematocrit 34 down to 26 in the past 3 months  --- Acute on chronic diastolic CHF Would discharge on Lasix 40 mg daily Needs follow-up with CHF clinic and cardiology clinic  ---Anemia Likely contributing to symptoms   iron is low,   she would benefit from iron infusion , Iron pill daily with laxative   ---hypertension Currently on hydralazine and metoprolol No room to advance the beta blocker given heart rate in the mid 50s this afternoon Could add long-acting nitrate if needed  Greater than 50% was spent in counseling and coordination of care with patient Total encounter time 35 minutes or more   Signed: Dossie Arbour  M.D., Ph.D. [facility]

## 2017-01-27 NOTE — Progress Notes (Signed)
ANTICOAGULATION CONSULT NOTE - Initial Consult  Pharmacy Consult for Heparin Drip  Indication: pulmonary embolus  No Known Allergies  Patient Measurements: Height:  (167.6 cm) Weight: 171 lb 1.6 oz (77.6 kg) IBW/kg (Calculated) : 59.3  Vital Signs: Temp: 98.4 F (36.9 C) (04/17 1952) Temp Source: Oral (04/17 1952) BP: 158/89 (04/17 1952) Pulse Rate: 65 (04/17 2307)  Labs:  Recent Labs  01/26/17 1112 01/26/17 1519 01/26/17 1722 01/26/17 2117 01/26/17 2256  HGB 10.1*  --   --   --   --   HCT 30.2*  --   --   --   --   PLT 144*  --   --   --   --   APTT  --  30  --   --   --   LABPROT  --  14.8  --   --   --   INR  --  1.15  --   --   --   HEPARINUNFRC  --   --   --   --  0.49  CREATININE 2.51*  --   --   --   --   TROPONINI 0.94*  --  2.26* 2.61*  --     Estimated Creatinine Clearance: 19.7 mL/min (A) (by C-G formula based on SCr of 2.51 mg/dL (H)).   Medical History: Past Medical History:  Diagnosis Date  . Anxiety   . Arthritis   . CKD (chronic kidney disease), stage IV (HCC)   . Closed patellar sleeve fracture of right knee    a. 01/2017 - conservatively managed.  Marland Kitchen COPD (chronic obstructive pulmonary disease) (HCC)   . Coronary artery disease    a. s/p remote stenting of unknown vessel @ [facility].  . Depression   . Diabetes mellitus without complication (HCC)   . Diastolic dysfunction    a. 01/2008 Echo ([facility]): EF > 55%, mod-sev LVH w/ grade 1 DD, biatrial enlargement, mild AS, trace MR/TR.  Marland Kitchen Hyperlipidemia   . Hypertension   . Parkinson disease [facility])     Assessment: 76 yo female with acute respiratory distress and possible PE. Pharmacy consulted for heparin dosing and monitoring.   Goal of Therapy:  Heparin level 0.3-0.7 units/ml Monitor platelets by anticoagulation protocol: Yes   Plan:  Baseline labs ordered Give 4000 units bolus x 1 Start heparin infusion at 1200 units/hr Check anti-Xa level in 8 hours and daily while on heparin Continue  to monitor H&H and platelets   4/17 PM heparin level 0.49. Continue current regimen. Check confirmatory level in 8 hours.  Erich Montane, PharmD, BCPS Clinical Pharmacist 01/27/2017 12:34 AM

## 2017-01-28 LAB — CBC
HCT: 26.2 % — ABNORMAL LOW (ref 35.0–47.0)
HEMOGLOBIN: 8.9 g/dL — AB (ref 12.0–16.0)
MCH: 29.4 pg (ref 26.0–34.0)
MCHC: 33.9 g/dL (ref 32.0–36.0)
MCV: 86.8 fL (ref 80.0–100.0)
Platelets: 139 10*3/uL — ABNORMAL LOW (ref 150–440)
RBC: 3.02 MIL/uL — AB (ref 3.80–5.20)
RDW: 13.9 % (ref 11.5–14.5)
WBC: 3.7 10*3/uL (ref 3.6–11.0)

## 2017-01-28 LAB — GLUCOSE, CAPILLARY
Glucose-Capillary: 106 mg/dL — ABNORMAL HIGH (ref 65–99)
Glucose-Capillary: 132 mg/dL — ABNORMAL HIGH (ref 65–99)

## 2017-01-28 LAB — POTASSIUM: Potassium: 4.5 mmol/L (ref 3.5–5.1)

## 2017-01-28 LAB — PROTIME-INR
INR: 1.11
Prothrombin Time: 14.3 seconds (ref 11.4–15.2)

## 2017-01-28 LAB — FERRITIN: FERRITIN: 99 ng/mL (ref 11–307)

## 2017-01-28 LAB — HEPARIN LEVEL (UNFRACTIONATED): HEPARIN UNFRACTIONATED: 0.29 [IU]/mL — AB (ref 0.30–0.70)

## 2017-01-28 MED ORDER — ISOSORBIDE DINITRATE ER 40 MG PO CPCR
40.0000 mg | ORAL_CAPSULE | Freq: Two times a day (BID) | ORAL | Status: DC
  Administered 2017-01-28 – 2017-01-29 (×3): 40 mg via ORAL
  Filled 2017-01-28 (×5): qty 1

## 2017-01-28 MED ORDER — WARFARIN SODIUM 5 MG PO TABS
5.0000 mg | ORAL_TABLET | Freq: Every day | ORAL | Status: DC
  Administered 2017-01-28 – 2017-01-29 (×2): 5 mg via ORAL
  Filled 2017-01-28 (×2): qty 1

## 2017-01-28 MED ORDER — ENOXAPARIN SODIUM 80 MG/0.8ML ~~LOC~~ SOLN
1.0000 mg/kg | SUBCUTANEOUS | Status: DC
  Administered 2017-01-28 – 2017-01-29 (×2): 80 mg via SUBCUTANEOUS
  Filled 2017-01-28 (×3): qty 0.8

## 2017-01-28 MED ORDER — ISOSORBIDE DINITRATE ER 40 MG PO CPCR
40.0000 mg | ORAL_CAPSULE | Freq: Two times a day (BID) | ORAL | Status: DC
  Filled 2017-01-28: qty 1

## 2017-01-28 MED ORDER — WARFARIN - PHARMACIST DOSING INPATIENT
Freq: Every day | Status: DC
  Administered 2017-01-28 – 2017-01-29 (×2)

## 2017-01-28 MED ORDER — INSULIN ASPART 100 UNIT/ML ~~LOC~~ SOLN: 0.0000 [IU] | Freq: Three times a day (TID) | SUBCUTANEOUS | Status: DC

## 2017-01-28 MED ORDER — HYDRALAZINE HCL 50 MG PO TABS
100.0000 mg | ORAL_TABLET | Freq: Two times a day (BID) | ORAL | Status: DC
  Administered 2017-01-28 – 2017-01-29 (×3): 100 mg via ORAL
  Filled 2017-01-28 (×4): qty 2

## 2017-01-28 MED ORDER — HEPARIN BOLUS VIA INFUSION
1100.0000 [IU] | Freq: Once | INTRAVENOUS | Status: AC
  Administered 2017-01-28: 1100 [IU] via INTRAVENOUS
  Filled 2017-01-28: qty 1100

## 2017-01-28 MED ORDER — INSULIN ASPART 100 UNIT/ML ~~LOC~~ SOLN: 0.0000 [IU] | Freq: Every day | SUBCUTANEOUS | Status: DC

## 2017-01-28 MED ORDER — FUROSEMIDE 10 MG/ML IJ SOLN
40.0000 mg | Freq: Two times a day (BID) | INTRAMUSCULAR | Status: DC
  Administered 2017-01-28 – 2017-01-29 (×2): 40 mg via INTRAVENOUS
  Filled 2017-01-28 (×2): qty 4

## 2017-01-28 MED ORDER — ISOSORBIDE DINITRATE ER 40 MG PO TBCR
40.0000 mg | EXTENDED_RELEASE_TABLET | Freq: Two times a day (BID) | ORAL | Status: DC
  Filled 2017-01-28: qty 1

## 2017-01-28 NOTE — Progress Notes (Addendum)
ANTICOAGULATION CONSULT NOTE - Initial Consult  Pharmacy Consult for Heparin Drip  Indication: pulmonary embolus   Pharmacy consulted for heparin drip management for 76 yo female being treated for pulmonary embolus. Patient is currently ordered heparin 1200 units/hr.   Goal of Therapy:  Heparin level 0.3-0.7 units/ml Monitor platelets by anticoagulation protocol: Yes   Plan:  Will continue heparin 1200 units/hr. Will recheck anti-Xa level with am labs.   4/19 AM heparin level 0.29. 1100 unit bolus and increase rate to 1350 units/hr. Recheck in 8 hours.  No Known Allergies  Patient Measurements: Height:  (167.6 cm) Weight: 171 lb 1.6 oz (77.6 kg) IBW/kg (Calculated) : 59.3  Vital Signs: Temp: 97.7 F (36.5 C) (04/19 0349) Temp Source: Oral (04/19 0349) BP: 144/79 (04/19 0349) Pulse Rate: 63 (04/19 0349)  Labs:  Recent Labs  01/26/17 1112 01/26/17 1519 01/26/17 1722 01/26/17 2117 01/26/17 2256 01/27/17 0655 01/28/17 0421  HGB 10.1*  --   --   --   --  8.8* 8.9*  HCT 30.2*  --   --   --   --  26.8* 26.2*  PLT 144*  --   --   --   --  134* 139*  APTT  --  30  --   --   --   --   --   LABPROT  --  14.8  --   --   --   --   --   INR  --  1.15  --   --   --   --   --   HEPARINUNFRC  --   --   --   --  0.49 0.42 0.29*  CREATININE 2.51*  --   --   --   --  2.46*  --   TROPONINI 0.94*  --  2.26* 2.61*  --   --   --     Estimated Creatinine Clearance: 20.1 mL/min (A) (by C-G formula based on SCr of 2.46 mg/dL (H)).   Medical History: Past Medical History:  Diagnosis Date  . Anxiety   . Arthritis   . CKD (chronic kidney disease), stage IV (HCC)   . Closed patellar sleeve fracture of right knee    a. 01/2017 - conservatively managed.  Marland Kitchen COPD (chronic obstructive pulmonary disease) (HCC)   . Coronary artery disease    a. s/p remote stenting of unknown vessel @ [facility].  . Depression   . Diabetes mellitus without complication (HCC)   . Diastolic dysfunction    a. 01/2008 Echo ([facility]): EF > 55%, mod-sev LVH w/ grade 1 DD, biatrial enlargement, mild AS, trace MR/TR.  Marland Kitchen Hyperlipidemia   . Hypertension   . Parkinson disease [facility])     Pharmacy will continue to monitor and adjust per consult.   Chequita Mofield S,  01/28/2017 6:12 AM

## 2017-01-28 NOTE — Progress Notes (Signed)
[employer] - [place] [facility]   PATIENT NAME: Molly Wolf    MR#:  [id number]  DATE OF BIRTH:  09-Nov-1940  SUBJECTIVE:   Patient here due to shortness of breath and noted to be in acute respiratory failure secondary to CHF and also noted to have a DVT. Shortness of breath has improved. Denies any chest pain or any other associated complaints presently.  REVIEW OF SYSTEMS:    Review of Systems  Constitutional: Negative for chills and fever.  HENT: Negative for congestion and tinnitus.   Eyes: Negative for blurred vision and double vision.  Respiratory: Positive for shortness of breath (improved). Negative for cough and wheezing.   Cardiovascular: Negative for chest pain, orthopnea and PND.  Gastrointestinal: Negative for abdominal pain, diarrhea, nausea and vomiting.  Genitourinary: Negative for dysuria and hematuria.  Neurological: Negative for dizziness, sensory change and focal weakness.  All other systems reviewed and are negative.   Nutrition: Heart Healthy/Carb control Tolerating Diet: Yes Tolerating PT: Await Eval.    DRUG ALLERGIES:  No Known Allergies  VITALS:  Blood pressure (!) 153/67, pulse 62, temperature 98 F (36.7 C), temperature source Oral, resp. rate 16, height  (1.676 m), weight 77.6 kg (171 lb 1.6 oz), SpO2 99 %.  PHYSICAL EXAMINATION:   Physical Exam  GENERAL:  76 y.o.-year-old patient lying in bed in no acute distress.  EYES: Pupils equal, round, reactive to light and accommodation. No scleral icterus. Extraocular muscles intact.  HEENT: Head atraumatic, normocephalic. Oropharynx and nasopharynx clear.  NECK:  Supple, no jugular venous distention. No thyroid enlargement, no tenderness.  LUNGS: Normal breath sounds bilaterally, no wheezing, rales, rhonchi. No use of accessory muscles of respiration.  CARDIOVASCULAR: S1, S2 normal. No murmurs, rubs, or gallops.  ABDOMEN: Soft, nontender, nondistended. Bowel sounds present.  No organomegaly or mass.  EXTREMITIES: No cyanosis, clubbing or edema b/l.    NEUROLOGIC: Cranial nerves II through XII are intact. No focal Motor or sensory deficits b/l. Globally weak   PSYCHIATRIC: The patient is alert and oriented x 3.  SKIN: No obvious rash, lesion, or ulcer.    LABORATORY PANEL:   CBC  Recent Labs Lab 01/28/17 0421  WBC 3.7  HGB 8.9*  HCT 26.2*  PLT 139*   ------------------------------------------------------------------------------------------------------------------  Chemistries   Recent Labs Lab 01/26/17 1112 01/27/17 0655  NA 137 136  K 4.8 4.5  CL 103 105  CO2 21* 25  GLUCOSE 276* 180*  BUN 35* 42*  CREATININE 2.51* 2.46*  CALCIUM 8.4* 8.2*  MG 1.7  --    ------------------------------------------------------------------------------------------------------------------  Cardiac Enzymes  Recent Labs Lab 01/26/17 2117  TROPONINI 2.61*   ------------------------------------------------------------------------------------------------------------------  RADIOLOGY:  Nm Pulmonary Perf And Vent  Result Date: 01/26/2017 CLINICAL DATA:  Shortness of breath EXAM: NUCLEAR MEDICINE VENTILATION - PERFUSION LUNG SCAN TECHNIQUE: Ventilation images were obtained in multiple projections using inhaled aerosol Tc-8m DTPA. Perfusion images were obtained in multiple projections after intravenous injection of Tc-69m MAA. RADIOPHARMACEUTICALS:  32.1 mCi Technetium-71m DTPA aerosol inhalation and 3.5 mCi Technetium-30m MAA IV COMPARISON:  Chest radiograph earlier today. FINDINGS: Ventilation: No focal ventilation defect. Perfusion: No wedge shaped peripheral perfusion defects to suggest acute pulmonary embolism. IMPRESSION: Low probability pulmonary embolus. Electronically Signed   By: Elsie Stain M.D.   On: 01/26/2017 17:02     ASSESSMENT AND PLAN:   76 year old female with past medical history of depression, Parkinson's disease, hyperlipidemia,  hypertension, history of CHF, diabetes, coronary artery disease, chronic kidney  disease stage IV recent patellar fracture presented to the hospital due to shortness of breath.  1. Acute respiratory failure with hypoxia-secondary to CHF. -Diuresis with IV Lasix and, since improved since admission. Continue O2 supplementation. We'll reduce Lasix.  2. CHF-acute on chronic combined diastolic/systolic dysfunction. -Showing ejection fraction of 45-50% was on high dose IV Lasix 80 twice a day, with taper dose to 40 twice a day. -about 2-1/2 L negative since admission. - cont. Metoprolol, Isosorbide  3. Acute DVT - pt. Has recent patellar Fracture.  - was on IV heparin gtt and will switch to Lovenox, Coumadin today.   4. Elevated troponin-secondary to CHF and respiratory failure, no acute chest pain. Seen by cardiology and no plans for aggressive intervention given her chronic kidney disease and advanced age.  5. Parkinson's disease-continue Sinemet.  6. Diabetes type 2 without complication-continue Lantus, sliding scale insulin.  7. Depression-continue Lexapro.  Await PT eval and possible d/c in 1-2 days.    All the records are reviewed and case discussed with Care Management/Social Worker. Management plans discussed with the patient, family and they are in agreement.  CODE STATUS: Full code  DVT Prophylaxis: Lovenox, Coumadin  TOTAL TIME TAKING CARE OF THIS PATIENT: 30 minutes.   POSSIBLE D/C IN 1-2 DAYS, DEPENDING ON CLINICAL CONDITION.   [place] Siren M.D on 01/28/2017 at 4:46 PM  Between 7am to 6pm - Pager - [phone]  After 6pm go to [email or web address] - Scientist, research (life sciences) [facility]  Office  [phone]  CC: Primary care physician; Darliss Ridgel, MD

## 2017-01-28 NOTE — Consult Note (Signed)
[facility] Vascular Consult Note  MRN : [id number]  Molly Wolf is a 76 y.o. (Mar 14, 1941) female who presents with chief complaint of  Chief Complaint  Patient presents with  . Shortness of Breath  .  History of Present Illness: I am asked to evaluate the patient by Dr. Elisabeth Pigeon for DVT.  The patient is a 76 year old woman who has multiple medical problems including advanced renal insufficiency and coronary artery disease. She presented to [facility] with profound shortness of breath. She denied chest pain. She recently has been dealing with orthopedic issues and has had her leg immobilized. Given these 2 findings suspicion for pulmonary embolism was high and a VQ scan was obtained secondary to her renal insufficiency. However, this was a low probability scan.  Patient denies leg swelling. She does have right leg pain secondary to her patellar fracture but this has been getting better. Duplex ultrasound the right lower extremity demonstrated tibial vein DVT.  Current Facility-Administered Medications  Medication Dose Route Frequency Provider Last Rate Last Dose  . acetaminophen (TYLENOL) tablet 650 mg  650 mg Oral Q6H PRN Alford Highland, MD       Or  . acetaminophen (TYLENOL) suppository 650 mg  650 mg Rectal Q6H PRN Alford Highland, MD      . aspirin EC tablet 81 mg  81 mg Oral Daily Alford Highland, MD   81 mg at 01/28/17 1024  . atorvastatin (LIPITOR) tablet 20 mg  20 mg Oral Daily Alford Highland, MD   20 mg at 01/28/17 1024  . Carbidopa-Levodopa ER (SINEMET CR) 25-100 MG tablet controlled release 2 tablet  2 tablet Oral QID Alford Highland, MD   2 tablet at 01/28/17 1750  . clopidogrel (PLAVIX) tablet 75 mg  75 mg Oral Daily Alford Highland, MD   75 mg at 01/28/17 1024  . enoxaparin (LOVENOX) injection 80 mg  1 mg/kg Subcutaneous Q24H Houston Siren, MD   80 mg at 01/28/17 1127  . escitalopram (LEXAPRO) tablet 20 mg  20 mg Oral Daily  Alford Highland, MD   20 mg at 01/28/17 1024  . furosemide (LASIX) injection 40 mg  40 mg Intravenous Once Alford Highland, MD      . furosemide (LASIX) injection 40 mg  40 mg Intravenous BID Houston Siren, MD   40 mg at 01/28/17 1750  . gabapentin (NEURONTIN) capsule 300 mg  300 mg Oral Daily Alford Highland, MD   300 mg at 01/28/17 1024  . hydrALAZINE (APRESOLINE) tablet 100 mg  100 mg Oral BID Laurier Nancy, MD   100 mg at 01/28/17 1025  . insulin aspart (novoLOG) injection 0-5 Units  0-5 Units Subcutaneous QHS Houston Siren, MD      . insulin aspart (novoLOG) injection 0-9 Units  0-9 Units Subcutaneous TID WC Houston Siren, MD      . insulin glargine (LANTUS) injection 40 Units  40 Units Subcutaneous QHS Alford Highland, MD   40 Units at 01/27/17 2127  . isosorbide dinitrate (DILATRATE-SR) capsule 40 mg  40 mg Oral Q12H Laurier Nancy, MD   40 mg at 01/28/17 1024  . loratadine (CLARITIN) tablet 5 mg  5 mg Oral Daily PRN Alford Highland, MD      . LORazepam (ATIVAN) tablet 1 mg  1 mg Oral BID PRN Alford Highland, MD   1 mg at 01/27/17 2127  . metoprolol tartrate (LOPRESSOR) tablet 12.5 mg  12.5 mg Oral BID [employer],  MD   12.5 mg at 01/28/17 1025  . mirabegron ER (MYRBETRIQ) tablet 25 mg  25 mg Oral Daily Alford Highland, MD   25 mg at 01/28/17 1024  . nitroGLYCERIN (NITROSTAT) SL tablet 0.4 mg  0.4 mg Sublingual Q5 min PRN Nita Sickle, MD   0.4 mg at 01/26/17 1216  . ondansetron (ZOFRAN) injection 4 mg  4 mg Intravenous Q6H PRN Alexis Hugelmeyer, DO   4 mg at 01/26/17 2238  . polyethylene glycol (MIRALAX / GLYCOLAX) packet 17 g  17 g Oral Daily PRN Alford Highland, MD   17 g at 01/27/17 1400  . saccharomyces boulardii (FLORASTOR) capsule 250 mg  250 mg Oral Daily Alford Highland, MD   250 mg at 01/28/17 1024  . sodium chloride flush (NS) 0.9 % injection 3 mL  3 mL Intravenous Q12H Alford Highland, MD   3 mL at 01/27/17 2128  . traMADol (ULTRAM) tablet 50 mg  50 mg Oral Q12H  PRN Alford Highland, MD      . warfarin (COUMADIN) tablet 5 mg  5 mg Oral q1800 Houston Siren, MD   5 mg at 01/28/17 1750  . Warfarin - Pharmacist Dosing Inpatient   Does not apply [id number] Houston Siren, MD        Past Medical History:  Diagnosis Date  . Anxiety   . Arthritis   . CKD (chronic kidney disease), stage IV (HCC)   . Closed patellar sleeve fracture of right knee    a. 01/2017 - conservatively managed.  Marland Kitchen COPD (chronic obstructive pulmonary disease) (HCC)   . Coronary artery disease    a. s/p remote stenting of unknown vessel @ [facility].  . Depression   . Diabetes mellitus without complication (HCC)   . Diastolic dysfunction    a. 01/2008 Echo ([facility]): EF > 55%, mod-sev LVH w/ grade 1 DD, biatrial enlargement, mild AS, trace MR/TR.  Marland Kitchen Hyperlipidemia   . Hypertension   . Parkinson disease [facility])     Past Surgical History:  Procedure Laterality Date  . BACK SURGERY    . CORONARY ANGIOPLASTY WITH STENT PLACEMENT    . NECK SURGERY      Social History Social History  Substance Use Topics  . Smoking status: Never Smoker  . Smokeless tobacco: Never Used  . Alcohol use No    Family History Family History  Problem Relation Age of Onset  . Cervical cancer Mother   . Kidney failure Father   . CAD Father   . CAD Brother   . Prostate cancer Neg Hx   . Kidney cancer Neg Hx   . Bladder Cancer Neg Hx   No family history of bleeding/clotting disorders, porphyria or autoimmune disease   No Known Allergies   REVIEW OF SYSTEMS (Negative unless checked)  Constitutional: Weight loss  Fever  Chills Cardiac: Chest pain   Chest pressure   Palpitations   Shortness of breath when laying flat   Shortness of breath at rest   Shortness of breath with exertion. Vascular:  Pain in legs with walking   Pain in legs at rest   Pain in legs when laying flat   Claudication   Pain in feet when walking  Pain in feet at rest  Pain in feet when laying flat    History of DVT   Phlebitis   Swelling in legs   Varicose veins   Non-healing ulcers Pulmonary:   Uses home oxygen   Productive cough   Hemoptysis     Wheeze  COPD   Asthma Neurologic:  Dizziness  Blackouts   Seizures   History of stroke   History of TIA  Aphasia   Temporary blindness   Dysphagia   Weakness or numbness in arms   Weakness or numbness in legs Musculoskeletal:  Arthritis   Joint swelling   Joint pain   Low back pain Hematologic:  Easy bruising  Easy bleeding   Hypercoagulable state   Anemic  Hepatitis Gastrointestinal:  Blood in stool   Vomiting blood  Gastroesophageal reflux/heartburn   Difficulty swallowing. Genitourinary:  Chronic kidney disease   Difficult urination  Frequent urination  Burning with urination   Blood in urine Skin:  Rashes   Ulcers   Wounds Psychological:  History of anxiety    History of major depression.  Physical Examination  Vitals:   01/28/17 0349 01/28/17 0814 01/28/17 1344 01/28/17 2007  BP: (!) 144/79 (!) 185/74 (!) 153/67 (!) 185/79  Pulse: 63 65 62 64  Resp: Temp: 97.7 F (36.5 C) 97.8 F (36.6 C) 98 F (36.7 C) 98.4 F (36.9 C)  TempSrc: Oral Oral Oral Oral  SpO2: 98% 99% 99% 100%  Weight:      Height:       Body mass index is 27.62 kg/m. Gen:  WD/WN, NAD Head: [place]/AT, No temporalis wasting. Prominent temp pulse not noted. Ear/Nose/Throat: Hearing grossly intact, nares w/o erythema or drainage, oropharynx w/o Erythema/Exudate Eyes: Sclera non-icteric, conjunctiva clear Neck: Trachea midline.  No JVD.  Pulmonary:  Good air movement, respirations not labored, equal bilaterally.  Cardiac: RRR, normal S1, S2. Vascular:  Vessel Right Left  Radial Palpable Palpable  Ulnar Palpable Palpable  Brachial Palpable Palpable  Carotid Palpable, without bruit Palpable, without bruit  Gastrointestinal: soft, non-tender/non-distended. No  guarding/reflex.  Musculoskeletal: M/S 5/5 throughout.  Extremities without ischemic changes.  No deformity or atrophy. No edema. Neurologic: Continuous tremor present Sensation grossly intact in extremities.  Symmetrical.  Speech is fluent. Motor exam as listed above. Psychiatric: Judgment intact, Mood & affect appropriate for pt's clinical situation. Dermatologic: No rashes or ulcers noted.  No cellulitis or open wounds. Lymph : No Cervical, Axillary, or Inguinal lymphadenopathy.      CBC Lab Results  Component Value Date   WBC 3.7 01/28/2017   HGB 8.9 (L) 01/28/2017   HCT 26.2 (L) 01/28/2017   MCV 86.8 01/28/2017   PLT 139 (L) 01/28/2017    BMET    Component Value Date/Time   NA 136 01/27/2017 0655   K 4.5 01/28/2017 1630   CL 105 01/27/2017 0655   CO2 25 01/27/2017 0655   GLUCOSE 180 (H) 01/27/2017 0655   BUN 42 (H) 01/27/2017 0655   CREATININE 2.46 (H) 01/27/2017 0655   CALCIUM 8.2 (L) 01/27/2017 0655   GFRNONAA 18 (L) 01/27/2017 0655   GFRAA 21 (L) 01/27/2017 0655   Estimated Creatinine Clearance: 20.1 mL/min (A) (by C-G formula based on SCr of 2.46 mg/dL (H)).  COAG Lab Results  Component Value Date   INR 1.11 01/28/2017   INR 1.15 01/26/2017   INR 1.02 12/27/2016    Radiology Dg Chest 2 View  Result Date: 01/26/2017 CLINICAL DATA:  Shortness of breath since yesterday, productive cough, orthopnea. History of diabetes and coronary artery disease with angioplasty and stent placement. Nonsmoker. EXAM: CHEST  2 VIEW COMPARISON:  Chest x-ray of November 06, 2016 FINDINGS: The lungs are well-expanded. The pulmonary vascularity is engorged. The interstitial markings are mildly increased  bilaterally. The cardiac silhouette is enlarged. There are small bilateral pleural effusions layering posteriorly. There is calcification in the wall of the aortic arch. There is multilevel degenerative disc disease of the thoracic spine. IMPRESSION: CHF with mild pulmonary interstitial  edema new since the previous study. No acute pneumonia. Thoracic aortic atherosclerosis. Electronically Signed   By: David  [place] M.D.   On: 01/26/2017 11:52   Nm Pulmonary Perf And Vent  Result Date: 01/26/2017 CLINICAL DATA:  Shortness of breath EXAM: NUCLEAR MEDICINE VENTILATION - PERFUSION LUNG SCAN TECHNIQUE: Ventilation images were obtained in multiple projections using inhaled aerosol Tc-64m DTPA. Perfusion images were obtained in multiple projections after intravenous injection of Tc-21m MAA. RADIOPHARMACEUTICALS:  32.1 mCi Technetium-79m DTPA aerosol inhalation and 3.5 mCi Technetium-19m MAA IV COMPARISON:  Chest radiograph earlier today. FINDINGS: Ventilation: No focal ventilation defect. Perfusion: No wedge shaped peripheral perfusion defects to suggest acute pulmonary embolism. IMPRESSION: Low probability pulmonary embolus. Electronically Signed   By: Elsie Stain M.D.   On: 01/26/2017 17:02   US Venous Img Lower Bilateral  Result Date: 01/26/2017 CLINICAL DATA:  76 y/o F; history of right knee fracture with shortness of breath. EXAM: BILATERAL LOWER EXTREMITY VENOUS DOPPLER ULTRASOUND TECHNIQUE: Gray-scale sonography with graded compression, as well as color Doppler and duplex ultrasound were performed to evaluate the lower extremity deep venous systems from the level of the common femoral vein and including the common femoral, femoral, profunda femoral, popliteal and calf veins including the posterior tibial, peroneal and gastrocnemius veins when visible. The superficial great saphenous vein was also interrogated. Spectral Doppler was utilized to evaluate flow at rest and with distal augmentation maneuvers in the common femoral, femoral and popliteal veins. COMPARISON:  None. FINDINGS: RIGHT LOWER EXTREMITY Common Femoral Vein: No evidence of thrombus. Normal compressibility, respiratory phasicity and response to augmentation. Saphenofemoral Junction: No evidence of thrombus. Normal  compressibility and flow on color Doppler imaging. Profunda Femoral Vein: No evidence of thrombus. Normal compressibility and flow on color Doppler imaging. Femoral Vein: No evidence of thrombus. Normal compressibility, respiratory phasicity and response to augmentation. Popliteal Vein: No evidence of thrombus. Normal compressibility, respiratory phasicity and response to augmentation. Calf Veins: Occlusive thrombus of the right peroneal vein. Other calf veins of are patent. Superficial Great Saphenous Vein: No evidence of thrombus. Normal compressibility and flow on color Doppler imaging. Venous Reflux:  None. Other Findings:  None. LEFT LOWER EXTREMITY Common Femoral Vein: No evidence of thrombus. Normal compressibility, respiratory phasicity and response to augmentation. Saphenofemoral Junction: No evidence of thrombus. Normal compressibility and flow on color Doppler imaging. Profunda Femoral Vein: No evidence of thrombus. Normal compressibility and flow on color Doppler imaging. Femoral Vein: No evidence of thrombus. Normal compressibility, respiratory phasicity and response to augmentation. Popliteal Vein: No evidence of thrombus. Normal compressibility, respiratory phasicity and response to augmentation. Calf Veins: No evidence of thrombus. Normal compressibility and flow on color Doppler imaging. Superficial Great Saphenous Vein: No evidence of thrombus. Normal compressibility and flow on color Doppler imaging. Venous Reflux:  None. Other Findings: Left popliteal fossa complex cyst measuring 4.8 x 1.5 x 2.7 cm, probably a Baker cyst. IMPRESSION: 1. Occlusive thrombus of the right peroneal vein in the calf. No evidence of additional thrombus of the bilateral lower extremities. 2. Left popliteal fossa complex cyst, probably a Baker cyst. These results will be called to the ordering clinician or representative by the Radiologist Assistant, and communication documented in the PACS or zVision Dashboard.  Electronically Signed   By:  Mitzi Hansen M.D.   On: 01/26/2017 14:59      Assessment/Plan 1. DVT right lower extremity: Patient has tibial vein DVT and a low probability VQ scan. She will continue to use the brace secondary to her patellar fracture on the right and therefore is immobilized. Furthermore she is currently in the hospital and in bed a significant percentage of the time. Thus, I believe anticoagulation is warranted even though her DVT is only at the tibial level. IVC filter is not indicated at this time as she is tolerating her anticoagulation and there is no evidence of pulmonary embolism. 2. Congestive heart failure: This is most likely etiology of her shortness of breath and cause for admission. Cardiology is following as his medicine. She is undergoing diuresis and supplemental oxygen. Continued treatment as ordered. 3. Renal insufficiency: Diuresis will be monitored and nephrotoxic medications will be avoided.   Levora Dredge, MD  01/28/2017 8:50 PM    This note was created with Dragon medical transcription system.  Any error is purely unintentional

## 2017-01-28 NOTE — Progress Notes (Signed)
ANTICOAGULATION CONSULT NOTE - Initial Consult  Pharmacy Consult for warfarin and Enoxaparin  Indication: VTE  No Known Allergies  Patient Measurements: Height: 5' 6"  (167.6 cm) Weight: 171 lb 1.6 oz (77.6 kg) IBW/kg (Calculated) : 59.3 Heparin Dosing Weight:   Vital Signs: Temp: 97.8 F (36.6 C) (04/19 0814) Temp Source: Oral (04/19 0814) BP: 185/74 (04/19 0814) Pulse Rate: 65 (04/19 0814)  Labs:  Recent Labs  01/26/17 1112 01/26/17 1519 01/26/17 1722 01/26/17 2117 01/26/17 2256 01/27/17 0655 01/28/17 0421  HGB 10.1*  --   --   --   --  8.8* 8.9*  HCT 30.2*  --   --   --   --  26.8* 26.2*  PLT 144*  --   --   --   --  134* 139*  APTT  --  30  --   --   --   --   --   LABPROT  --  14.8  --   --   --   --   --   INR  --  1.15  --   --   --   --   --   HEPARINUNFRC  --   --   --   --  0.49 0.42 0.29*  CREATININE 2.51*  --   --   --   --  2.46*  --   TROPONINI 0.94*  --  2.26* 2.61*  --   --   --     Estimated Creatinine Clearance: 20.1 mL/min (A) (by C-G formula based on SCr of 2.46 mg/dL (H)).   Medical History: Past Medical History:  Diagnosis Date  . Anxiety   . Arthritis   . CKD (chronic kidney disease), stage IV ([place])   . Closed patellar sleeve fracture of right knee    a. 01/2017 - conservatively managed.  Marland Kitchen COPD (chronic obstructive pulmonary disease) ([place])   . Coronary artery disease    a. s/p remote stenting of unknown vessel @ [facility].  . Depression   . Diabetes mellitus without complication ([place])   . Diastolic dysfunction    a. 01/2008 Echo ([facility]): EF > 55%, mod-sev LVH w/ grade 1 DD, biatrial enlargement, mild AS, trace MR/TR.  Marland Kitchen Hyperlipidemia   . Hypertension   . Parkinson disease ([place])     Medications:  Prescriptions Prior to Admission  Medication Sig Dispense Refill Last Dose  . acetaminophen (TYLENOL) 325 MG tablet Take 650 mg by mouth every 6 (six) hours as needed for mild pain.   prn at prn  . atorvastatin (LIPITOR) 20 MG tablet Take 20  mg by mouth daily.   01/25/2017 at Unknown time  . Carbidopa-Levodopa ER (SINEMET CR) 25-100 MG tablet controlled release Take 2 tablets by mouth 4 (four) times daily.   01/25/2017 at Unknown time  . clopidogrel (PLAVIX) 75 MG tablet Take 75 mg by mouth daily.   01/25/2017 at Unknown time  . Coenzyme Q10 (COQ10) 50 MG CAPS Take 50 mg by mouth daily.   01/25/2017 at Unknown time  . CRANBERRY PO Take 1 tablet by mouth daily.   01/25/2017 at Unknown time  . escitalopram (LEXAPRO) 20 MG tablet Take 20 mg by mouth daily.   01/25/2017 at Unknown time  . furosemide (LASIX) 20 MG tablet Take 20 mg by mouth daily.    01/25/2017 at Unknown time  . gabapentin (NEURONTIN) 300 MG capsule Take 300 mg by mouth daily.    01/25/2017 at Unknown time  .  hydrALAZINE (APRESOLINE) 50 MG tablet Take 50 mg by mouth 2 (two) times daily.    01/25/2017 at Unknown time  . insulin glargine (LANTUS) 100 UNIT/ML injection Inject 40 Units into the skin at bedtime.    01/25/2017 at Unknown time  . insulin lispro (HUMALOG) 100 UNIT/ML injection Inject 6 Units into the skin 3 (three) times daily with meals.    01/25/2017 at Unknown time  . isosorbide mononitrate (IMDUR) 60 MG 24 hr tablet Take 60 mg by mouth daily.   01/25/2017 at Unknown time  . loratadine (CLARITIN) 10 MG tablet Take 5 mg by mouth daily as needed for allergies.    prn at prn  . LORazepam (ATIVAN) 1 MG tablet Take 1 tablet (1 mg total) by mouth 2 (two) times daily as needed for anxiety. 5 tablet 0 prn at prn  . metoprolol tartrate (LOPRESSOR) 25 MG tablet Take 12.5 mg by mouth 2 (two) times daily.   01/25/2017 at Unknown time  . mirabegron ER (MYRBETRIQ) 25 MG TB24 tablet Take 25 mg by mouth daily.   01/25/2017 at Unknown time  . nitroGLYCERIN (NITROSTAT) 0.4 MG SL tablet Place 0.4 mg under the tongue every 5 (five) minutes as needed for chest pain.   prn at prn  . Omega-3 Fatty Acids (FISH OIL) 1000 MG CAPS Take 1,000 mg by mouth daily.   01/25/2017 at Unknown time  .  polyethylene glycol (MIRALAX / GLYCOLAX) packet Take 17 g by mouth daily as needed for mild constipation.   prn at prn  . promethazine (PHENERGAN) 25 MG tablet Take 25 mg by mouth every 6 (six) hours as needed for nausea or vomiting.   prn at prn  . saccharomyces boulardii (FLORASTOR) 250 MG capsule Take 250 mg by mouth daily.   01/25/2017 at Unknown time  . traMADol (ULTRAM) 50 MG tablet Take 1 tablet (50 mg total) by mouth daily as needed. 5 tablet 0 prn at prn  . blood glucose meter kit and supplies KIT Dispense based on patient and insurance preference. Use up to four times daily as directed. (FOR ICD-9 250.00, 250.01). 1 each 0 Taking   Scheduled:  . aspirin EC  81 mg Oral Daily  . atorvastatin  20 mg Oral Daily  . Carbidopa-Levodopa ER  2 tablet Oral QID  . clopidogrel  75 mg Oral Daily  . enoxaparin (LOVENOX) injection  1 mg/kg Subcutaneous Q24H  . escitalopram  20 mg Oral Daily  . furosemide  40 mg Intravenous Once  . furosemide  80 mg Intravenous BID  . gabapentin  300 mg Oral Daily  . hydrALAZINE  100 mg Oral BID  . insulin glargine  40 Units Subcutaneous QHS  . isosorbide dinitrate  40 mg Oral Q12H  . metoprolol tartrate  12.5 mg Oral BID  . mirabegron ER  25 mg Oral Daily  . saccharomyces boulardii  250 mg Oral Daily  . sodium chloride flush  3 mL Intravenous Q12H  . warfarin  5 mg Oral q1800  . Warfarin - Pharmacist Dosing Inpatient   Does not apply q1800    Assessment: Pharmacy consulted to dose and monitor warfarin and Enoxaparin in this 76 year old women with CKD previously on Heparin gtt. MD Verdell Carmine would like to start patient on Lovenox therapy (bridging) along with warfarin.   Goal of Therapy:  INR 2-3 Monitor platelets by anticoagulation protocol: Yes   Plan:  Will discontinue Heparin gtt and will start Enoxaparin 80 mg daily starting @  12:00. Will also start Warfarin 5 mg PO daily.   PT/INR and CBCs with am labs.    Edvardo Honse D 01/28/2017,11:07 AM

## 2017-01-28 NOTE — H&P (Signed)
Molly Wolf is a 76 y.o. female  [id number]  Primary Cardiologist: Neoma Laming Reason for Consultation: Elevated troponin  HPI: This is a 76 year old white female with a past medical history of multiple medical problems, including chronic stage IV renal disease and history of coronary artery disease status post PCI at [facility] in the past with normal left reticular systolic function in the past presented to the hospital with shortness of breath. She had a VQ scan which was unremarkable. She denies any chest pain and shortness of breath has significantly improved since admission.   Review of Systems: Does have some orthopnea PND but no chest pain   Past Medical History:  Diagnosis Date  . Anxiety   . Arthritis   . CKD (chronic kidney disease), stage IV ([place])   . Closed patellar sleeve fracture of right knee    a. 01/2017 - conservatively managed.  Marland Kitchen COPD (chronic obstructive pulmonary disease) ([place])   . Coronary artery disease    a. s/p remote stenting of unknown vessel @ [facility].  . Depression   . Diabetes mellitus without complication ([place])   . Diastolic dysfunction    a. 01/2008 Echo ([facility]): EF > 55%, mod-sev LVH w/ grade 1 DD, biatrial enlargement, mild AS, trace MR/TR.  Marland Kitchen Hyperlipidemia   . Hypertension   . Parkinson disease ([place])     Medications Prior to Admission  Medication Sig Dispense Refill  . acetaminophen (TYLENOL) 325 MG tablet Take 650 mg by mouth every 6 (six) hours as needed for mild pain.    Marland Kitchen atorvastatin (LIPITOR) 20 MG tablet Take 20 mg by mouth daily.    . Carbidopa-Levodopa ER (SINEMET CR) 25-100 MG tablet controlled release Take 2 tablets by mouth 4 (four) times daily.    . clopidogrel (PLAVIX) 75 MG tablet Take 75 mg by mouth daily.    . Coenzyme Q10 (COQ10) 50 MG CAPS Take 50 mg by mouth daily.    Marland Kitchen CRANBERRY PO Take 1 tablet by mouth daily.    Marland Kitchen escitalopram (LEXAPRO) 20 MG tablet Take 20 mg by mouth daily.    . furosemide (LASIX) 20 MG tablet Take 20 mg  by mouth daily.     Marland Kitchen gabapentin (NEURONTIN) 300 MG capsule Take 300 mg by mouth daily.     . hydrALAZINE (APRESOLINE) 50 MG tablet Take 50 mg by mouth 2 (two) times daily.     . insulin glargine (LANTUS) 100 UNIT/ML injection Inject 40 Units into the skin at bedtime.     . insulin lispro (HUMALOG) 100 UNIT/ML injection Inject 6 Units into the skin 3 (three) times daily with meals.     . isosorbide mononitrate (IMDUR) 60 MG 24 hr tablet Take 60 mg by mouth daily.    Marland Kitchen loratadine (CLARITIN) 10 MG tablet Take 5 mg by mouth daily as needed for allergies.     Marland Kitchen LORazepam (ATIVAN) 1 MG tablet Take 1 tablet (1 mg total) by mouth 2 (two) times daily as needed for anxiety. 5 tablet 0  . metoprolol tartrate (LOPRESSOR) 25 MG tablet Take 12.5 mg by mouth 2 (two) times daily.    . mirabegron ER (MYRBETRIQ) 25 MG TB24 tablet Take 25 mg by mouth daily.    . nitroGLYCERIN (NITROSTAT) 0.4 MG SL tablet Place 0.4 mg under the tongue every 5 (five) minutes as needed for chest pain.    . Omega-3 Fatty Acids (FISH OIL) 1000 MG CAPS Take 1,000 mg by mouth daily.    Marland Kitchen  polyethylene glycol (MIRALAX / GLYCOLAX) packet Take 17 g by mouth daily as needed for mild constipation.    . promethazine (PHENERGAN) 25 MG tablet Take 25 mg by mouth every 6 (six) hours as needed for nausea or vomiting.    . saccharomyces boulardii (FLORASTOR) 250 MG capsule Take 250 mg by mouth daily.    . traMADol (ULTRAM) 50 MG tablet Take 1 tablet (50 mg total) by mouth daily as needed. 5 tablet 0  . blood glucose meter kit and supplies KIT Dispense based on patient and insurance preference. Use up to four times daily as directed. (FOR ICD-9 250.00, 250.01). 1 each 0     . aspirin EC  81 mg Oral Daily  . atorvastatin  20 mg Oral Daily  . Carbidopa-Levodopa ER  2 tablet Oral QID  . clopidogrel  75 mg Oral Daily  . escitalopram  20 mg Oral Daily  . furosemide  40 mg Intravenous Once  . furosemide  80 mg Intravenous BID  . gabapentin  300 mg  Oral Daily  . hydrALAZINE  50 mg Oral BID  . insulin glargine  40 Units Subcutaneous QHS  . metoprolol tartrate  12.5 mg Oral BID  . mirabegron ER  25 mg Oral Daily  . saccharomyces boulardii  250 mg Oral Daily  . sodium chloride flush  3 mL Intravenous Q12H    Infusions: . heparin 1,350 Units/hr (01/28/17 7622)    No Known Allergies  Social History   Social History  . Marital status: Single    Spouse name: N/A  . Number of children: N/A  . Years of education: N/A   Occupational History  . Not on file.   Social History Main Topics  . Smoking status: Never Smoker  . Smokeless tobacco: Never Used  . Alcohol use No  . Drug use: No  . Sexual activity: No   Other Topics Concern  . Not on file   Social History Narrative  . No narrative on file    Family History  Problem Relation Age of Onset  . Cervical cancer Mother   . Kidney failure Father   . CAD Father   . CAD Brother   . Prostate cancer Neg Hx   . Kidney cancer Neg Hx   . Bladder Cancer Neg Hx     PHYSICAL EXAM: Vitals:   01/28/17 0349 01/28/17 0814  BP: (!) 144/79 (!) 185/74  Pulse: 63 65  Resp: 18 18  Temp: 97.7 F (36.5 C) 97.8 F (36.6 C)     Intake/Output Summary (Last 24 hours) at 01/28/17 0853 Last data filed at 01/28/17 0819  Gross per 24 hour  Intake           868.53 ml  Output             2300 ml  Net         -1431.47 ml    General:  Well appearing. No respiratory difficulty HEENT: normal Neck: supple. no JVD. Carotids 2+ bilat; no bruits. No lymphadenopathy or thryomegaly appreciated. Cor: PMI nondisplaced. Regular rate & rhythm. No rubs, gallops or murmurs. Lungs: clear Abdomen: soft, nontender, nondistended. No hepatosplenomegaly. No bruits or masses. Good bowel sounds. Extremities: no cyanosis, clubbing, rash, edema Neuro: alert & oriented x 3, cranial nerves grossly intact. moves all 4 extremities w/o difficulty. Affect pleasant.  QJF:HLKTG rhythm with atrial flutter and  controlled ventricular rate  Results for orders placed or performed during the hospital encounter of 01/26/17 (  from the past 24 hour(s))  Glucose, capillary     Status: Abnormal   Collection Time: 01/27/17  9:21 PM  Result Value Ref Range   Glucose-Capillary 165 (H) 65 - 99 mg/dL  Ferritin     Status: None   Collection Time: 01/28/17  4:21 AM  Result Value Ref Range   Ferritin 99 11 - 307 ng/mL  CBC     Status: Abnormal   Collection Time: 01/28/17  4:21 AM  Result Value Ref Range   WBC 3.7 3.6 - 11.0 K/uL   RBC 3.02 (L) 3.80 - 5.20 MIL/uL   Hemoglobin 8.9 (L) 12.0 - 16.0 g/dL   HCT 26.2 (L) 35.0 - 47.0 %   MCV 86.8 80.0 - 100.0 fL   MCH 29.4 26.0 - 34.0 pg   MCHC 33.9 32.0 - 36.0 g/dL   RDW 13.9 11.5 - 14.5 %   Platelets 139 (L) 150 - 440 K/uL  Heparin level (unfractionated)     Status: Abnormal   Collection Time: 01/28/17  4:21 AM  Result Value Ref Range   Heparin Unfractionated 0.29 (L) 0.30 - 0.70 IU/mL   Dg Chest 2 View  Result Date: 01/26/2017 CLINICAL DATA:  Shortness of breath since yesterday, productive cough, orthopnea. History of diabetes and coronary artery disease with angioplasty and stent placement. Nonsmoker. EXAM: CHEST  2 VIEW COMPARISON:  Chest x-ray of November 06, 2016 FINDINGS: The lungs are well-expanded. The pulmonary vascularity is engorged. The interstitial markings are mildly increased bilaterally. The cardiac silhouette is enlarged. There are small bilateral pleural effusions layering posteriorly. There is calcification in the wall of the aortic arch. There is multilevel degenerative disc disease of the thoracic spine. IMPRESSION: CHF with mild pulmonary interstitial edema new since the previous study. No acute pneumonia. Thoracic aortic atherosclerosis. Electronically Signed   By: David  Martinique M.D.   On: 01/26/2017 11:52   Nm Pulmonary Perf And Vent  Result Date: 01/26/2017 CLINICAL DATA:  Shortness of breath EXAM: NUCLEAR MEDICINE VENTILATION - PERFUSION  LUNG SCAN TECHNIQUE: Ventilation images were obtained in multiple projections using inhaled aerosol Tc-73mDTPA. Perfusion images were obtained in multiple projections after intravenous injection of Tc-942mAA. RADIOPHARMACEUTICALS:  32.1 mCi Technetium-9920mPA aerosol inhalation and 3.5 mCi Technetium-10m40m IV COMPARISON:  Chest radiograph earlier today. FINDINGS: Ventilation: No focal ventilation defect. Perfusion: No wedge shaped peripheral perfusion defects to suggest acute pulmonary embolism. IMPRESSION: Low probability pulmonary embolus. Electronically Signed   By: JohnStaci Righter.   On: 01/26/2017 17:02   Us VKoreaous Img Lower Bilateral  Result Date: 01/26/2017 CLINICAL DATA:  76 y48 F; history of right knee fracture with shortness of breath. EXAM: BILATERAL LOWER EXTREMITY VENOUS DOPPLER ULTRASOUND TECHNIQUE: Gray-scale sonography with graded compression, as well as color Doppler and duplex ultrasound were performed to evaluate the lower extremity deep venous systems from the level of the common femoral vein and including the common femoral, femoral, profunda femoral, popliteal and calf veins including the posterior tibial, peroneal and gastrocnemius veins when visible. The superficial great saphenous vein was also interrogated. Spectral Doppler was utilized to evaluate flow at rest and with distal augmentation maneuvers in the common femoral, femoral and popliteal veins. COMPARISON:  None. FINDINGS: RIGHT LOWER EXTREMITY Common Femoral Vein: No evidence of thrombus. Normal compressibility, respiratory phasicity and response to augmentation. Saphenofemoral Junction: No evidence of thrombus. Normal compressibility and flow on color Doppler imaging. Profunda Femoral Vein: No evidence of thrombus. Normal compressibility and flow on color Doppler  imaging. Femoral Vein: No evidence of thrombus. Normal compressibility, respiratory phasicity and response to augmentation. Popliteal Vein: No evidence of  thrombus. Normal compressibility, respiratory phasicity and response to augmentation. Calf Veins: Occlusive thrombus of the right peroneal vein. Other calf veins of are patent. Superficial Great Saphenous Vein: No evidence of thrombus. Normal compressibility and flow on color Doppler imaging. Venous Reflux:  None. Other Findings:  None. LEFT LOWER EXTREMITY Common Femoral Vein: No evidence of thrombus. Normal compressibility, respiratory phasicity and response to augmentation. Saphenofemoral Junction: No evidence of thrombus. Normal compressibility and flow on color Doppler imaging. Profunda Femoral Vein: No evidence of thrombus. Normal compressibility and flow on color Doppler imaging. Femoral Vein: No evidence of thrombus. Normal compressibility, respiratory phasicity and response to augmentation. Popliteal Vein: No evidence of thrombus. Normal compressibility, respiratory phasicity and response to augmentation. Calf Veins: No evidence of thrombus. Normal compressibility and flow on color Doppler imaging. Superficial Great Saphenous Vein: No evidence of thrombus. Normal compressibility and flow on color Doppler imaging. Venous Reflux:  None. Other Findings: Left popliteal fossa complex cyst measuring 4.8 x 1.5 x 2.7 cm, probably a Baker cyst. IMPRESSION: 1. Occlusive thrombus of the right peroneal vein in the calf. No evidence of additional thrombus of the bilateral lower extremities. 2. Left popliteal fossa complex cyst, probably a Baker cyst. These results will be called to the ordering clinician or representative by the Radiologist Assistant, and communication documented in the PACS or zVision Dashboard. Electronically Signed   By: Kristine Garbe M.D.   On: 01/26/2017 14:59     ASSESSMENT AND PLAN: Atrial flutter with controlled ventricular rate and elevated troponin without chest pain. Elevated troponin due to demand ischemia and renal failure. Due to multiple medical problems including anemia  and renal failure unable to do cardiac catheterization. Advise treating the patient medically. Will get echocardiogram to further evaluate ejection fraction at this time.  Kiah Keay A

## 2017-01-29 ENCOUNTER — Telehealth: Payer: Self-pay | Admitting: Physician Assistant

## 2017-01-29 DIAGNOSIS — I82409 Acute embolism and thrombosis of unspecified deep veins of unspecified lower extremity: Secondary | ICD-10-CM

## 2017-01-29 LAB — BASIC METABOLIC PANEL
Anion gap: 7 (ref 5–15)
BUN: 48 mg/dL — AB (ref 6–20)
CALCIUM: 8.5 mg/dL — AB (ref 8.9–10.3)
CHLORIDE: 101 mmol/L (ref 101–111)
CO2: 30 mmol/L (ref 22–32)
CREATININE: 2.5 mg/dL — AB (ref 0.44–1.00)
GFR calc non Af Amer: 17 mL/min — ABNORMAL LOW (ref >60)
GFR, EST AFRICAN AMERICAN: 20 mL/min — AB (ref >60)
Glucose, Bld: 42 mg/dL — CL (ref 65–99)
Potassium: 4.1 mmol/L (ref 3.5–5.1)
Sodium: 138 mmol/L (ref 135–145)

## 2017-01-29 LAB — GLUCOSE, CAPILLARY
GLUCOSE-CAPILLARY: 74 mg/dL (ref 65–99)
GLUCOSE-CAPILLARY: 78 mg/dL (ref 65–99)
GLUCOSE-CAPILLARY: 89 mg/dL (ref 65–99)
Glucose-Capillary: 39 mg/dL — CL (ref 65–99)
Glucose-Capillary: 43 mg/dL — CL (ref 65–99)
Glucose-Capillary: 59 mg/dL — ABNORMAL LOW (ref 65–99)
Glucose-Capillary: 65 mg/dL (ref 65–99)

## 2017-01-29 LAB — CBC
HEMATOCRIT: 30.3 % — AB (ref 35.0–47.0)
HEMOGLOBIN: 10 g/dL — AB (ref 12.0–16.0)
MCH: 28.6 pg (ref 26.0–34.0)
MCHC: 33 g/dL (ref 32.0–36.0)
MCV: 86.8 fL (ref 80.0–100.0)
Platelets: 216 10*3/uL (ref 150–440)
RBC: 3.49 MIL/uL — ABNORMAL LOW (ref 3.80–5.20)
RDW: 13.8 % (ref 11.5–14.5)
WBC: 4.5 10*3/uL (ref 3.6–11.0)

## 2017-01-29 LAB — PROTIME-INR
INR: 1.05
Prothrombin Time: 13.7 seconds (ref 11.4–15.2)

## 2017-01-29 MED ORDER — INSULIN GLARGINE 100 UNIT/ML ~~LOC~~ SOLN
20.0000 [IU] | Freq: Every day | SUBCUTANEOUS | Status: DC
  Filled 2017-01-29: qty 0.2

## 2017-01-29 MED ORDER — CARBIDOPA-LEVODOPA 25-100 MG PO TABS
2.0000 | ORAL_TABLET | Freq: Four times a day (QID) | ORAL | Status: DC
  Administered 2017-01-29: 2 via ORAL
  Filled 2017-01-29: qty 2

## 2017-01-29 MED ORDER — WARFARIN SODIUM 5 MG PO TABS: 5.0000 mg | ORAL_TABLET | Freq: Every day | ORAL | Status: DC

## 2017-01-29 MED ORDER — ENOXAPARIN SODIUM 80 MG/0.8ML ~~LOC~~ SOLN: 1.0000 mg/kg | SUBCUTANEOUS | Status: DC

## 2017-01-29 MED ORDER — FUROSEMIDE 40 MG PO TABS: 40.0000 mg | ORAL_TABLET | Freq: Every day | ORAL | Status: DC

## 2017-01-29 NOTE — Progress Notes (Signed)
Hypoglycemic Event  CBG: 43  Treatment: 15 GM carbohydrate snack  Symptoms: None  Follow-up CBG: Time:1152 CBG Result: 78  Possible Reasons for Event: Inadequate meal intake  Comments/MD notified: Dr. Cherlynn Kaiser aware, to review DM Coordinator note and make adjustments. Patient about to eat lunch.     Molly Wolf

## 2017-01-29 NOTE — Progress Notes (Signed)
Patient and grandson given education/demonstration regarding how to give lovenox injections - Home Health following as well.

## 2017-01-29 NOTE — Evaluation (Signed)
Occupational Therapy Evaluation Patient Details Name: Molly Wolf MRN: [id number] DOB: 06-Mar-1941 Today's Date: 01/29/2017    History of Present Illness Molly Wolf  is a 76 y.o. female presented with shortness of breath for a few days. She just got out of rehabilitation yesterday. She was in rehabilitation after being discharged from the hospital with a right patella fracture and she is in a brace. She has been coughing up whitish phlegm. She can't breathe well. Her son called her daughter because of her shortness of breath and was brought in to the ER. In the ER, she was found to be in acute respiratory failure and placed on BiPAP. She is felt to be in heart failure and given a dose of Lasix and started on nitro drip. With her creatinine being elevated a VQ scan was ordered to rule out pulmonary embolism. She also had episodes that looked like ventricular tachycardia but are actually atrial flutter. No complaints of chest pain. Pt now admitted with RLE DVT as well as acute respiratory failure secondary to CHF.    Clinical Impression   Pt is 76 year old female admitted with SOB with recent Dc home after going to rehab at [facility] after sustaining a R patellar fracture and has a knee brace in place at all times, WBAT and knee flexion limited to 90 degrees.  Pt lives with her grandson and his girlfriend and will have 24/7 assist.  Pt currently requires supervision only with extra time and min assist for LB dressing while in seated position due to pain and limited AROM of R knee.  She declined instruction in use of AD and prefers to have her family assist her so no further therapy recommended in hospital or after DC home.  Rec supervision 24/7 to prevent falls at home.    Follow Up Recommendations  No OT follow up    Equipment Recommendations   (rec using a shower chair with back and pt does not recall if hers has a back or not)    Recommendations for Other Services       Precautions /  Restrictions Precautions Precautions: Fall;Knee Precaution Comments: Must wear R knee brace secondary to patellar fracture. Flexion limited to 90 degrees Required Braces or Orthoses: Knee Immobilizer - Right Knee Immobilizer - Right: On when out of bed or walking Restrictions Weight Bearing Restrictions: No Other Position/Activity Restrictions: No WB restrictions per patient report      Mobility Bed Mobility Overal bed mobility: Modified Independent             General bed mobility comments: Pt moves slowly and with use of bed rails. Able to perform without external assistance  Transfers Overall transfer level: Needs assistance Equipment used: Rolling walker (2 wheeled) Transfers: Sit to/from Stand Sit to Stand: Min guard         General transfer comment: Pt able to perform transfers without external assistance. Requires UE support on walker to stabilize    Balance Overall balance assessment: Needs assistance Sitting-balance support: No upper extremity supported Sitting balance-Leahy Scale: Good     Standing balance support: No upper extremity supported Standing balance-Leahy Scale: Fair Standing balance comment: Pt able to maintain wide stance without UE support. She is unable to place her feet together without falling unless she has external support from walker                           ADL either performed or  assessed with clinical judgement   ADL Overall ADL's : Needs assistance/impaired Eating/Feeding: Independent;Set up Eating/Feeding Details (indicate cue type and reason): tremors in B hands, and head but ale to compensate and not interested in rec for weighted utensils to assist  Grooming: Wash/dry hands;Wash/dry face;Brushing hair;Independent;Set up Grooming Details (indicate cue type and reason): extra time needed             Lower Body Dressing: Set up;Minimal assistance;Cueing for safety;Sit to/from stand Lower Body Dressing Details  (indicate cue type and reason): with LE brace on; rec AD but pt indicated she will have family complete for her since its easier   Toilet Transfer Details (indicate cue type and reason): pt declined going to bathroom to complete toilet transfer due to fatigue                 Vision         Perception     Praxis      Pertinent Vitals/Pain Pain Assessment: No/denies pain     Hand Dominance Right   Extremity/Trunk Assessment Upper Extremity Assessment Upper Extremity Assessment: Generalized weakness   Lower Extremity Assessment Lower Extremity Assessment: Defer to PT evaluation RLE Deficits / Details: Pt able to perform R LAQ against gravity. No MMT of R knee flexion or extension       Communication Communication Communication: No difficulties   Cognition Arousal/Alertness: Awake/alert Behavior During Therapy: WFL for tasks assessed/performed;Anxious Overall Cognitive Status: Within Functional Limits for tasks assessed                                     General Comments       Exercises     Shoulder Instructions      Home Living Family/patient expects to be discharged to:: Private residence Living Arrangements: Other relatives Available Help at Discharge: Family;Available 24 hours/day Type of Home: House Home Access: Stairs to enter [employer] of Steps: 2-3 Entrance Stairs-Rails: Right;Left;Can reach both Home Layout: One level     Bathroom Shower/Tub: Walk-in shower;Curtain (pt has a Paediatric nurse)   Bathroom Toilet: Standard     Home Equipment: Environmental consultant - 4 wheels;Wheelchair - manual;Shower seat;Bedside commode          Prior Functioning/Environment Level of Independence: Needs assistance    ADL's / Homemaking Assistance Needed: will have 24 7 assist from family; was at [facility] for rehab, went home for a day and came back to hospital with SOB   Comments: Sup/mod indep for household mobilization, assist from  daughter for ADLs/bathing as needed; utilizes manual WC for longer, community distances.  Does endorse multiple 4-5 falls in the last 12 months        OT Problem List:        OT Treatment/Interventions:      OT Goals(Current goals can be found in the care plan section) Acute Rehab OT Goals Patient Stated Goal: Return to prior level of function  OT Frequency:     Barriers to D/C:            Co-evaluation              End of Session    Activity Tolerance: Patient limited by fatigue Patient left: in bed;with call bell/phone within reach;with bed alarm set  OT Visit Diagnosis: Muscle weakness (generalized) (M62.81)  Time: 1315-1340 OT Time Calculation (min): 25 min Charges:  OT General Charges $OT Visit: 1 Procedure OT Evaluation $OT Eval Low Complexity: 1 Procedure OT Treatments $Self Care/Home Management : 8-22 mins G-Codes:     Susanne Borders, OTR/L ascom [phone] 01/29/17, 1:52 PM

## 2017-01-29 NOTE — Progress Notes (Signed)
Hypoglycemic Event  CBG: 59  Treatment: 15 GM carbohydrate snack  Symptoms: None  Follow-up CBG: Time:1707 CBG Result:89  Possible Reasons for Event: Inadequate meal intake  Comments: Patient about to eat dinner    Clayborne Dana

## 2017-01-29 NOTE — Progress Notes (Signed)
[facility]  Daily Progress Note    Subjective: Patient without complaint today. On coumadin for oral anticoagulation.   Objective: Vitals:   01/28/17 1344 01/28/17 2007 01/29/17 0538 01/29/17 0746  BP: (!) 153/67 (!) 185/79 (!) 162/68 (!) 157/79  Pulse: 62 64 61 68  Resp: Temp: 98 F (36.7 C) 98.4 F (36.9 C) 98.3 F (36.8 C) 97.5 F (36.4 C)  TempSrc: Oral Oral Oral Oral  SpO2: 99% 100% 100% 99%  Weight:   162 lb 8 oz (73.7 kg)   Height:        Intake/Output Summary (Last 24 hours) at 01/29/17 1124 Last data filed at 01/29/17 3244  Gross per 24 hour  Intake              120 ml  Output             2400 ml  Net            -2280 ml   Physical Exam: A&Ox3, NAD CV: RRR Pulmonary: CTA Bilaterally Abdomen: Soft, Nontender, Nondistended Vascular:  Right Lower Extremity: minimal edema, non-tender to palpation. No acute vascular compromise.   Laboratory: CBC    Component Value Date/Time   WBC 4.5 01/29/2017 0626   HGB 10.0 (L) 01/29/2017 0626   HCT 30.3 (L) 01/29/2017 0626   PLT 216 01/29/2017 0626   BMET    Component Value Date/Time   NA 138 01/29/2017 0626   K 4.1 01/29/2017 0626   CL 101 01/29/2017 0626   CO2 30 01/29/2017 0626   GLUCOSE 42 (LL) 01/29/2017 0626   BUN 48 (H) 01/29/2017 0626   CREATININE 2.50 (H) 01/29/2017 0626   CALCIUM 8.5 (L) 01/29/2017 0626   GFRNONAA 17 (L) 01/29/2017 0626   GFRAA 20 (L) 01/29/2017 0102   Assessment/Planning: 76 year old female with right patellar fracture and found to have right peroneal DVT and PE - stable 1) Transitioning over to PO anticoagulation with coumadin - Will be on PO anticoagulation for at least one year with PE 2) Will follow up with [place] as outpatient to follow DVT  Cleda Daub PA-C 01/29/2017 11:24 AM

## 2017-01-29 NOTE — Progress Notes (Signed)
Hypoglycemic Event  CBG: 39  Treatment: 15 GM carbohydrate snack  Symptoms: None  Follow-up CBG: Time:0758 CBG Result:74  Possible Reasons for Event: Inadequate meal intake  Comments: about to eat breakfast    Molly Wolf

## 2017-01-29 NOTE — Care Management Important Message (Signed)
Important Message  Patient Details  Name: Molly Wolf MRN: [id number] Date of Birth: 04/24/1941   Medicare Important Message Given:  Yes    Eber Hong, RN 01/29/2017, 8:09 AM

## 2017-01-29 NOTE — Progress Notes (Signed)
[employer] [facility] [place] [facility]   PATIENT NAME: Molly Wolf    MR#:  [id number]  DATE OF BIRTH:  1941/06/07  SUBJECTIVE:   Patient here due to shortness of breath and noted to be in acute respiratory failure secondary to CHF and also noted to have a DVT. Shortness of breath has improved. No other acute events overnight.   REVIEW OF SYSTEMS:    Review of Systems  Constitutional: Negative for chills and fever.  HENT: Negative for congestion and tinnitus.   Eyes: Negative for blurred vision and double vision.  Respiratory: Positive for shortness of breath (improved). Negative for cough and wheezing.   Cardiovascular: Negative for chest pain, orthopnea and PND.  Gastrointestinal: Negative for abdominal pain, diarrhea, nausea and vomiting.  Genitourinary: Negative for dysuria and hematuria.  Neurological: Negative for dizziness, sensory change and focal weakness.  All other systems reviewed and are negative.   Nutrition: Heart Healthy/Carb control Tolerating Diet: Yes Tolerating PT: Eval noted.    DRUG ALLERGIES:  No Known Allergies  VITALS:  Blood pressure 139/65, pulse 62, temperature 97.8 F (36.6 C), temperature source Oral, resp. rate 19, height  (1.676 m), weight 73.7 kg (162 lb 8 oz), SpO2 95 %.  PHYSICAL EXAMINATION:   Physical Exam  GENERAL:  76 y.o.-year-old patient lying in bed in no acute distress.  EYES: Pupils equal, round, reactive to light and accommodation. No scleral icterus. Extraocular muscles intact.  HEENT: Head atraumatic, normocephalic. Oropharynx and nasopharynx clear.  NECK:  Supple, no jugular venous distention. No thyroid enlargement, no tenderness.  LUNGS: Normal breath sounds bilaterally, no wheezing, rales, rhonchi. No use of accessory muscles of respiration.  CARDIOVASCULAR: S1, S2 normal. No murmurs, rubs, or gallops.  ABDOMEN: Soft, nontender, nondistended. Bowel sounds present. No organomegaly or mass.   EXTREMITIES: No cyanosis, clubbing or edema b/l.    NEUROLOGIC: Cranial nerves II through XII are intact. No focal Motor or sensory deficits b/l. Globally weak  ,  + Parkinsonian Tremor PSYCHIATRIC: The patient is alert and oriented x 3.  SKIN: No obvious rash, lesion, or ulcer.    LABORATORY PANEL:   CBC  Recent Labs Lab 01/29/17 0626  WBC 4.5  HGB 10.0*  HCT 30.3*  PLT 216   ------------------------------------------------------------------------------------------------------------------  Chemistries   Recent Labs Lab 01/26/17 1112  01/29/17 0626  NA 137  < > 138  K 4.8  < > 4.1  CL 103  < > 101  CO2 21*  < > 30  GLUCOSE 276*  < > 42*  BUN 35*  < > 48*  CREATININE 2.51*  < > 2.50*  CALCIUM 8.4*  < > 8.5*  MG 1.7  --   --   < > = values in this interval not displayed. ------------------------------------------------------------------------------------------------------------------  Cardiac Enzymes  Recent Labs Lab 01/26/17 2117  TROPONINI 2.61*   ------------------------------------------------------------------------------------------------------------------  RADIOLOGY:  No results found.   ASSESSMENT AND PLAN:   76 year old female with past medical history of depression, Parkinson's disease, hyperlipidemia, hypertension, history of CHF, diabetes, coronary artery disease, chronic kidney disease stage IV recent patellar fracture presented to the hospital due to shortness of breath.  1. Acute respiratory failure with hypoxia-secondary to CHF. -Much improved with IV diuresis. Lasix was tapered yesterday and switched to oral today. -Ambulated and weaned off oxygen and did not qualify.  2. CHF-acute on chronic combined diastolic/systolic dysfunction. - Echo showing ejection fraction of 45-50% was on high dose IV Lasix 80 twice a  day, and tapered to 40 IV BID yesterday and to Oral Daily. About 4.5 L (-) since admission. - cont. Metoprolol, Isosorbide  3.  Acute DVT - pt. Has recent patellar Fracture.  - cont. Lovenox, Coumadin.  Follow INR to goal of 1-2.   4. Elevated troponin-secondary to CHF and respiratory failure, no acute chest pain. Seen by cardiology and no plans for aggressive intervention given her chronic kidney disease and advanced age.  5. Parkinson's disease-continue Sinemet.  6. Diabetes type 2 without complication-continue Lantus, sliding scale insulin.  7. Depression-continue Lexapro.  PT eval noted and needs Home Health services and possible d/c today or tomorrow. Case Management working on Lovenox prescription.    All the records are reviewed and case discussed with Care Management/Social Worker. Management plans discussed with the patient, family and they are in agreement.  CODE STATUS: Full code  DVT Prophylaxis: Lovenox, Coumadin  TOTAL TIME TAKING CARE OF THIS PATIENT: 30 minutes.   POSSIBLE D/C IN 1-2 DAYS, DEPENDING ON CLINICAL CONDITION.   Houston Siren M.D on 01/29/2017 at 2:33 PM  Between 7am to 6pm - Pager - [phone]  After 6pm go to [email or web address] - Scientist, research (life sciences) [place]  Office  [phone]  CC: Primary care physician; Darliss Ridgel, MD

## 2017-01-29 NOTE — Evaluation (Signed)
Physical Therapy Evaluation Patient Details Name: Molly Wolf MRN: [id number] DOB: Jan 21, 1941 Today's Date: 01/29/2017   History of Present Illness  Molly Wolf  is a 76 y.o. female presented with shortness of breath for a few days. She just got out of rehabilitation yesterday. She was in rehabilitation after being discharged from the hospital with a right patella fracture and she is in a brace. She has been coughing up whitish phlegm. She can't breathe well. Her son called her daughter because of her shortness of breath and was brought in to the ER. In the ER, she was found to be in acute respiratory failure and placed on BiPAP. She is felt to be in heart failure and given a dose of Lasix and started on nitro drip. With her creatinine being elevated a VQ scan was ordered to rule out pulmonary embolism. She also had episodes that looked like ventricular tachycardia but are actually atrial flutter. No complaints of chest pain. Pt now admitted with RLE DVT as well as acute respiratory failure secondary to CHF.   Clinical Impression  Pt admitted with above diagnosis. Pt currently with functional limitations due to the deficits listed below (see PT Problem List).  R knee immobilizer donned for all mobility secondary to R patella fracture. Pt moves slowly with bed mobility, transfers, and ambulation but is able to perform without external assistance. No overt LOB with ambulation with use of rolling walker. SaO2 remains >95% on room air and HR in the 60's bpm. Pt with history of repeated falls. She is safe to return home with [facility] PT and supervision for all out of bed mobiilty with rolling walker. Pt will benefit from skilled PT services to address deficits in strength, balance, and mobility in order to return to full function at home.     Follow Up Recommendations Home health PT;Supervision for mobility/OOB    Equipment Recommendations  None recommended by PT;Other (comment) (Use walker at all times)     Recommendations for Other Services       Precautions / Restrictions Precautions Precautions: Fall;Knee Precaution Comments: Must wear R knee brace secondary to patellar fracture. Flexion limited to 90 degrees Required Braces or Orthoses: Knee Immobilizer - Right Knee Immobilizer - Right: On when out of bed or walking Restrictions Weight Bearing Restrictions: No Other Position/Activity Restrictions: No WB restrictions per patient report      Mobility  Bed Mobility Overal bed mobility: Modified Independent             General bed mobility comments: Pt moves slowly and with use of bed rails. Able to perform without external assistance  Transfers Overall transfer level: Needs assistance Equipment used: Rolling walker (2 wheeled) Transfers: Sit to/from Stand Sit to Stand: Min guard         General transfer comment: Pt able to perform transfers without external assistance. Requires UE support on walker to stabilize  Ambulation/Gait Ambulation/Gait assistance: Min guard Ambulation Distance (Feet): 80 Feet Assistive device: Rolling walker (2 wheeled) Gait Pattern/deviations: Decreased step length - right;Decreased step length - left Gait velocity: Decreased Gait velocity interpretation: <1.8 ft/sec, indicative of risk for recurrent falls General Gait Details: Pt ambulates with short shuffling steps to RN station and back to room. She has persistent RUE and head tremor from PD. SaO2 remains around 98% on room air and HR in the upper 60's bpm during ambulation. Pt reports SOB but no signs of respiratory distress evident  Stairs  Wheelchair Mobility    Modified Rankin (Stroke Patients Only)       Balance Overall balance assessment: Needs assistance Sitting-balance support: No upper extremity supported Sitting balance-Leahy Scale: Good     Standing balance support: No upper extremity supported Standing balance-Leahy Scale: Fair Standing balance  comment: Pt able to maintain wide stance without UE support. She is unable to place her feet together without falling unless she has external support from walker                             Pertinent Vitals/Pain Pain Assessment: No/denies pain    Home Living Family/patient expects to be discharged to:: Private residence Living Arrangements: Other relatives (Grandson and his girlfriend) Available Help at Discharge: Family;Available 24 hours/day Type of Home: House Home Access: Stairs to enter Entrance Stairs-Rails: Right;Left;Can reach both Entrance Stairs-Number of Steps: 2-3 Home Layout: One level Home Equipment: Walker - 4 wheels;Wheelchair - manual;Shower seat;Bedside commode      Prior Function Level of Independence: Needs assistance         Comments: Sup/mod indep for household mobilization, assist from daughter for ADLs/bathing as needed; utilizes manual WC for longer, community distances.  Does endorse multiple 4-5 falls in the last 12 months     Hand Dominance   Dominant Hand: Right    Extremity/Trunk Assessment   Upper Extremity Assessment Upper Extremity Assessment: Generalized weakness    Lower Extremity Assessment Lower Extremity Assessment: Generalized weakness;RLE deficits/detail RLE Deficits / Details: Pt able to perform R LAQ against gravity. No MMT of R knee flexion or extension       Communication   Communication: No difficulties  Cognition Arousal/Alertness: Awake/alert Behavior During Therapy: WFL for tasks assessed/performed Overall Cognitive Status: Within Functional Limits for tasks assessed                                        General Comments      Exercises     Assessment/Plan    PT Assessment Patient needs continued PT services  PT Problem List Decreased strength;Decreased balance       PT Treatment Interventions DME instruction;Gait training;Stair training;Functional mobility training;Therapeutic  activities;Therapeutic exercise;Neuromuscular re-education;Balance training;Patient/family education    PT Goals (Current goals can be found in the Care Plan section)  Acute Rehab PT Goals Patient Stated Goal: Return to prior level of function PT Goal Formulation: With patient Time For Goal Achievement: 02/12/17 Potential to Achieve Goals: Fair    Frequency Min 2X/week   Barriers to discharge        Co-evaluation               End of Session Equipment Utilized During Treatment: Gait belt Activity Tolerance: Patient tolerated treatment well Patient left: in bed;with call bell/phone within reach;with bed alarm set Nurse Communication: Mobility status;Other (comment) (Vitals during ambulation) PT Visit Diagnosis: Unsteadiness on feet (R26.81);Repeated falls (R29.6);Muscle weakness (generalized) (M62.81)    Time: 1203-1222 PT Time Calculation (min) (ACUTE ONLY): 19 min   Charges:   PT Evaluation $PT Eval Low Complexity: 1 Procedure     PT G Codes:   PT G-Codes **NOT FOR INPATIENT CLASS** Functional Assessment Tool Used: AM-PAC 6 Clicks Basic Mobility Functional Limitation: Mobility: Walking and moving around Mobility: Walking and Moving Around Current Status (R6045): At least 40 percent but less than 60 percent  impaired, limited or restricted Mobility: Walking and Moving Around Goal Status [phone]): At least 20 percent but less than 40 percent impaired, limited or restricted    Lynnea Maizes PT, DPT    Tu Bayle 01/29/2017, 12:58 PM

## 2017-01-29 NOTE — Telephone Encounter (Signed)
Attempted to contact pt regarding discharge from [facility] on 01/29/17. Left message asking pt to call back regarding discharge instructions and/or medications. Advised pt of appt w/ Eula Listen, PA on 02/08/17 at 2:00 w/ [facility]. Asked pt to call back if unable to keep this appt.

## 2017-01-29 NOTE — Progress Notes (Signed)
Patient given discharge teaching and paperwork regarding medications, diet, follow-up appointments and activity. Patient understanding verbalized. No complaints at this time. IV and telemetry discontinued prior to leaving. Skin assessment as previously charted and vitals are stable; on room air. Patient being discharged to home. Grandson present during discharge teaching. Prescriptions called in by Care Management; home health set up.

## 2017-01-29 NOTE — Care Management (Signed)
Informed by attending that patient can discharge home today with home health.  Patient will require lovenox injections to bridge her coumadin.  Patient is agreeable.  Informed Marcelino Duster with [facility] of potential discharge today or tomorrow and will need home visit within 24 hours of discharge and draw protimes.  Obtained order from attending for home health SN PT Aide. Lovenox script called to [facility] pharmacy- [address] and patient cost will be 64 dollars which is not cost prohibitive for patient.  Patient excited about discharging home.  Left message for daughter  Lupita Leash to call CM to discuss discharge.

## 2017-01-29 NOTE — Telephone Encounter (Signed)
TCM for pt to see Molly Listen, PA on 02/08/17 @ 2:00. Pt has not been given discharge instructions yet - I do not want to break the glass to see when she is expected to be discharged. Will attempt TCM call later.

## 2017-01-29 NOTE — Progress Notes (Addendum)
Inpatient Diabetes Program Recommendations  AACE/ADA: New Consensus Statement on Inpatient Glycemic Control (2015)  Target Ranges:  Prepandial:   less than 140 mg/dL      Peak postprandial:   less than 180 mg/dL (1-2 hours)      Critically ill patients:  140 - 180 mg/dL   Lab Results  Component Value Date   GLUCAP 74 01/29/2017    Review of Glycemic Control  Results for THIA, OLESEN (MRN [id number]) as of 01/29/2017 09:11  Ref. Range 01/28/2017 16:54 01/28/2017 20:43 01/29/2017 07:32 01/29/2017 07:33 01/29/2017 07:58  Glucose-Capillary Latest Ref Range: 65 - 99 mg/dL 106 (H) 132 (H) 40 (LL) 39 (LL) 74    Diabetes history: Type 2 Outpatient Diabetes medications: Lantus 40 units qhs, Humalog 6 units tid Current orders for Inpatient glycemic control: Lantus 40 units qhs, Novolog 0-9 units tid, Novolog 0-5 units qhs  Inpatient Diabetes Program Recommendations:  Consider decreasing Lantus 20 units qhs, continue Novolog as ordered.    Met patient at the bedside- reports taking  Humalog 3-6 units tid and Lantus 40 units qhs. Denies hypoglycemia at home.  Consider decreasing Lantus to 30 units qhs  (patient has no family doctor), Consider 4 units Humalog insulin bid with breakfast and supper.    Gentry Fitz, RN, BA, MHA, CDE Diabetes Coordinator Inpatient Diabetes Program  [phone] (Team Pager) [phone] ([place]) 01/29/2017 9:13 AM

## 2017-01-29 NOTE — Progress Notes (Signed)
   Discussed with Dr. Cherlynn Kaiser, the patient is being switched to PO Lasix today with plans to discharge to Rehab. Cardiology follow-up has been arranged. Please contact on-call provider for [facility] via Amion if we can be of further assistance this admission.   Signed, Ellsworth Lennox, PA-C 01/29/2017, 12:12 PM

## 2017-01-29 NOTE — Care Management (Signed)
Patient's grandson presented on unit to take patient home. (Voicemail messages have been left but calls not returned.) Notified attending and he will reconcile the discharge when he arrives home in approximately an hour.  Per attending request, CM called script for Coumadin to [facility] [address]-  #30 qd one refill.  Instructed grandson - Pennie Rushing to obtain the meds from [place].  Lucila Maine will be instructed on injection techniques.  Notified Elon Jester with [facility] of definite discharge and the need to see patient 4.21.

## 2017-01-29 NOTE — Progress Notes (Signed)
SATURATION QUALIFICATIONS: (This note is used to comply with regulatory documentation for home oxygen)  Patient Saturations on Room Air at Rest = 97%  Patient Saturations on Room Air while Ambulating = 96%  Patient Saturations on n/a Liters of oxygen while Ambulating = n/a%  Please briefly explain why patient needs home oxygen: does not need

## 2017-01-29 NOTE — Progress Notes (Signed)
ANTICOAGULATION CONSULT NOTE - Initial Consult  Pharmacy Consult for warfarin and Enoxaparin  Indication: VTE  No Known Allergies  Patient Measurements: Height: 5' 6"  (167.6 cm) Weight: 162 lb 8 oz (73.7 kg) IBW/kg (Calculated) : 59.3 Heparin Dosing Weight:   Vital Signs: Temp: 98.3 F (36.8 C) (04/20 0538) Temp Source: Oral (04/20 0538) BP: 162/68 (04/20 0538) Pulse Rate: 61 (04/20 0538)  Labs:  Recent Labs  01/26/17 1112 01/26/17 1519 01/26/17 1722 01/26/17 2117 01/26/17 2256 01/27/17 0655 01/28/17 0421 01/28/17 1141 01/29/17 0626  HGB 10.1*  --   --   --   --  8.8* 8.9*  --  10.0*  HCT 30.2*  --   --   --   --  26.8* 26.2*  --  30.3*  PLT 144*  --   --   --   --  134* 139*  --  216  APTT  --  30  --   --   --   --   --   --   --   LABPROT  --  14.8  --   --   --   --   --  14.3 13.7  INR  --  1.15  --   --   --   --   --  1.11 1.05  HEPARINUNFRC  --   --   --   --  0.49 0.42 0.29*  --   --   CREATININE 2.51*  --   --   --   --  2.46*  --   --   --   TROPONINI 0.94*  --  2.26* 2.61*  --   --   --   --   --     Estimated Creatinine Clearance: 19.7 mL/min (A) (by C-G formula based on SCr of 2.46 mg/dL (H)).   Medical History: Past Medical History:  Diagnosis Date  . Anxiety   . Arthritis   . CKD (chronic kidney disease), stage IV ([place])   . Closed patellar sleeve fracture of right knee    a. 01/2017 - conservatively managed.  Marland Kitchen COPD (chronic obstructive pulmonary disease) ([place])   . Coronary artery disease    a. s/p remote stenting of unknown vessel @ [facility].  . Depression   . Diabetes mellitus without complication ([place])   . Diastolic dysfunction    a. 01/2008 Echo ([facility]): EF > 55%, mod-sev LVH w/ grade 1 DD, biatrial enlargement, mild AS, trace MR/TR.  Marland Kitchen Hyperlipidemia   . Hypertension   . Parkinson disease ([place])     Medications:  Prescriptions Prior to Admission  Medication Sig Dispense Refill Last Dose  . acetaminophen (TYLENOL) 325 MG tablet Take  650 mg by mouth every 6 (six) hours as needed for mild pain.   prn at prn  . atorvastatin (LIPITOR) 20 MG tablet Take 20 mg by mouth daily.   01/25/2017 at Unknown time  . Carbidopa-Levodopa ER (SINEMET CR) 25-100 MG tablet controlled release Take 2 tablets by mouth 4 (four) times daily.   01/25/2017 at Unknown time  . clopidogrel (PLAVIX) 75 MG tablet Take 75 mg by mouth daily.   01/25/2017 at Unknown time  . Coenzyme Q10 (COQ10) 50 MG CAPS Take 50 mg by mouth daily.   01/25/2017 at Unknown time  . CRANBERRY PO Take 1 tablet by mouth daily.   01/25/2017 at Unknown time  . escitalopram (LEXAPRO) 20 MG tablet Take 20 mg by mouth daily.   01/25/2017 at  Unknown time  . furosemide (LASIX) 20 MG tablet Take 20 mg by mouth daily.    01/25/2017 at Unknown time  . gabapentin (NEURONTIN) 300 MG capsule Take 300 mg by mouth daily.    01/25/2017 at Unknown time  . hydrALAZINE (APRESOLINE) 50 MG tablet Take 50 mg by mouth 2 (two) times daily.    01/25/2017 at Unknown time  . insulin glargine (LANTUS) 100 UNIT/ML injection Inject 40 Units into the skin at bedtime.    01/25/2017 at Unknown time  . insulin lispro (HUMALOG) 100 UNIT/ML injection Inject 6 Units into the skin 3 (three) times daily with meals.    01/25/2017 at Unknown time  . isosorbide mononitrate (IMDUR) 60 MG 24 hr tablet Take 60 mg by mouth daily.   01/25/2017 at Unknown time  . loratadine (CLARITIN) 10 MG tablet Take 5 mg by mouth daily as needed for allergies.    prn at prn  . LORazepam (ATIVAN) 1 MG tablet Take 1 tablet (1 mg total) by mouth 2 (two) times daily as needed for anxiety. 5 tablet 0 prn at prn  . metoprolol tartrate (LOPRESSOR) 25 MG tablet Take 12.5 mg by mouth 2 (two) times daily.   01/25/2017 at Unknown time  . mirabegron ER (MYRBETRIQ) 25 MG TB24 tablet Take 25 mg by mouth daily.   01/25/2017 at Unknown time  . nitroGLYCERIN (NITROSTAT) 0.4 MG SL tablet Place 0.4 mg under the tongue every 5 (five) minutes as needed for chest pain.   prn at  prn  . Omega-3 Fatty Acids (FISH OIL) 1000 MG CAPS Take 1,000 mg by mouth daily.   01/25/2017 at Unknown time  . polyethylene glycol (MIRALAX / GLYCOLAX) packet Take 17 g by mouth daily as needed for mild constipation.   prn at prn  . promethazine (PHENERGAN) 25 MG tablet Take 25 mg by mouth every 6 (six) hours as needed for nausea or vomiting.   prn at prn  . saccharomyces boulardii (FLORASTOR) 250 MG capsule Take 250 mg by mouth daily.   01/25/2017 at Unknown time  . traMADol (ULTRAM) 50 MG tablet Take 1 tablet (50 mg total) by mouth daily as needed. 5 tablet 0 prn at prn  . blood glucose meter kit and supplies KIT Dispense based on patient and insurance preference. Use up to four times daily as directed. (FOR ICD-9 250.00, 250.01). 1 each 0 Taking   Scheduled:  . aspirin EC  81 mg Oral Daily  . atorvastatin  20 mg Oral Daily  . Carbidopa-Levodopa ER  2 tablet Oral QID  . clopidogrel  75 mg Oral Daily  . enoxaparin (LOVENOX) injection  1 mg/kg Subcutaneous Q24H  . escitalopram  20 mg Oral Daily  . furosemide  40 mg Intravenous Once  . furosemide  40 mg Intravenous BID  . gabapentin  300 mg Oral Daily  . hydrALAZINE  100 mg Oral BID  . insulin aspart  0-5 Units Subcutaneous QHS  . insulin aspart  0-9 Units Subcutaneous TID WC  . insulin glargine  40 Units Subcutaneous QHS  . isosorbide dinitrate  40 mg Oral Q12H  . metoprolol tartrate  12.5 mg Oral BID  . mirabegron ER  25 mg Oral Daily  . saccharomyces boulardii  250 mg Oral Daily  . sodium chloride flush  3 mL Intravenous Q12H  . warfarin  5 mg Oral q1800  . Warfarin - Pharmacist Dosing Inpatient   Does not apply q1800    Assessment: Pharmacy consulted to  dose and monitor warfarin and Enoxaparin in this 76 year old women with CKD previously on Heparin gtt. MD Verdell Carmine would like to start patient on Lovenox therapy (bridging) along with warfarin.   DATE  INR  DOSE 4/19  1.11  5 MG 4/20  1.05  5 MG   Goal of Therapy:  INR  2-3 Monitor platelets by anticoagulation protocol: Yes   Plan:  Continue warfarin 5 mg po daily. Patient is greater than 61 years old and SCr 2.46 mg/dL, CrCl approximately 20 mL/min - will not give higher initial doses in this patient.  Laural Benes, Pharm.D., BCPS Clinical Pharmacist 01/29/2017,7:29 AM

## 2017-01-30 NOTE — Discharge Summary (Signed)
[facility] NAME: Molly Wolf    MR#:  [id number]  DATE OF BIRTH:  1941/04/19  DATE OF ADMISSION:  01/26/2017 ADMITTING PHYSICIAN: Loletha Grayer, MD  DATE OF DISCHARGE: 01/29/2017  6:47 PM  PRIMARY CARE PHYSICIAN: Bergert, Luther Redo, MD    ADMISSION DIAGNOSIS:  Shortness of breath [R06.02] V-tach ([place]) [I47.2] Non-ST elevation myocardial infarction (NSTEMI) ([place]) [I21.4] Acute respiratory failure with hypoxia (HCC) [J96.01] Acute on chronic congestive heart failure, unspecified heart failure type ([place]) [I50.9] Acute respiratory failure ([place]) [J96.00]  DISCHARGE DIAGNOSIS:  Active Problems:   Acute respiratory failure with hypoxia (HCC)   Acute respiratory failure ([place])   SECONDARY DIAGNOSIS:   Past Medical History:  Diagnosis Date  . Anxiety   . Arthritis   . CKD (chronic kidney disease), stage IV ([place])   . Closed patellar sleeve fracture of right knee    a. 01/2017 - conservatively managed.  Marland Kitchen COPD (chronic obstructive pulmonary disease) ([place])   . Coronary artery disease    a. s/p remote stenting of unknown vessel @ [facility].  . Depression   . Diabetes mellitus without complication ([place])   . Diastolic dysfunction    a. 01/2008 Echo ([facility]): EF > 55%, mod-sev LVH w/ grade 1 DD, biatrial enlargement, mild AS, trace MR/TR.  Marland Kitchen Hyperlipidemia   . Hypertension   . Parkinson disease [facility])     HOSPITAL COURSE:   76 year old female with past medical history of depression, Parkinson's disease, hyperlipidemia, hypertension, history of CHF, diabetes, coronary artery disease, chronic kidney disease stage IV recent patellar fracture presented to the hospital due to shortness of breath.  1. Acute respiratory failure with hypoxia- this was secondary to CHF. -Patient was admitted to the hospital started on IV diuresis with Lasix. She responded well to the IV diuresis, and her shortness of breath and hypoxia significantly improved. She  was ambulated and did not qualify for home oxygen and not being discharged home on oral Lasix.  2. CHF-acute on chronic combined diastolic/systolic dysfunction. -Patient was diuresed with IV Lasix, responded well to it. She is about 5 L negative since admission. Shortness of breath is improved and therefore not she is being discharged home on a home dose Lasix at 40 mg daily. She will continue her metoprolol and isosorbide. She'll follow-up with cardiology as an outpatient.  3. Acute DVT - pt. Has recent patellar Fracture.  -Initially patient was placed on a heparin drip, and now being discharged on oral Coumadin and subcutaneous Lovenox. She is being discharged with home health services which will follow her PT/INR to goal between 2 and 3.  4. Elevated troponin-secondary to CHF and respiratory failure, no acute chest pain. She was Seen by cardiology and there were no plans for aggressive intervention given her chronic kidney disease and advanced age.  5. Parkinson's disease- she will continue Sinemet.  6. Diabetes type 2 without complication- pt. Will resume her Lantus, Lispro with meals upon discharge.   7. Depression- she will continue Lexapro.  She was found by physical therapy and recommended home health services and patient was discharged with home health physical therapy and nursing and aid.  DISCHARGE CONDITIONS:   Stable  CONSULTS OBTAINED:    DRUG ALLERGIES:  No Known Allergies  DISCHARGE MEDICATIONS:   Allergies as of 01/29/2017   No Known Allergies     Medication List    STOP taking these medications   clopidogrel 75 MG tablet Commonly known as:  PLAVIX     TAKE these medications   acetaminophen 325 MG tablet Commonly known as:  TYLENOL Take 650 mg by mouth every 6 (six) hours as needed for mild pain.   atorvastatin 20 MG tablet Commonly known as:  LIPITOR Take 20 mg by mouth daily.   blood glucose meter kit and supplies Kit Dispense based on  patient and insurance preference. Use up to four times daily as directed. (FOR ICD-9 250.00, 250.01).   Carbidopa-Levodopa ER 25-100 MG tablet controlled release Commonly known as:  SINEMET CR Take 2 tablets by mouth 4 (four) times daily.   CoQ10 50 MG Caps Take 50 mg by mouth daily.   CRANBERRY PO Take 1 tablet by mouth daily.   enoxaparin 80 MG/0.8ML injection Commonly known as:  LOVENOX Inject 0.8 mLs (80 mg total) into the skin daily.   escitalopram 20 MG tablet Commonly known as:  LEXAPRO Take 20 mg by mouth daily.   Fish Oil 1000 MG Caps Take 1,000 mg by mouth daily.   furosemide 20 MG tablet Commonly known as:  LASIX Take 20 mg by mouth daily.   gabapentin 300 MG capsule Commonly known as:  NEURONTIN Take 300 mg by mouth daily.   hydrALAZINE 50 MG tablet Commonly known as:  APRESOLINE Take 50 mg by mouth 2 (two) times daily.   insulin glargine 100 UNIT/ML injection Commonly known as:  LANTUS Inject 40 Units into the skin at bedtime.   insulin lispro 100 UNIT/ML injection Commonly known as:  HUMALOG Inject 6 Units into the skin 3 (three) times daily with meals.   isosorbide mononitrate 60 MG 24 hr tablet Commonly known as:  IMDUR Take 60 mg by mouth daily.   loratadine 10 MG tablet Commonly known as:  CLARITIN Take 5 mg by mouth daily as needed for allergies.   LORazepam 1 MG tablet Commonly known as:  ATIVAN Take 1 tablet (1 mg total) by mouth 2 (two) times daily as needed for anxiety.   metoprolol tartrate 25 MG tablet Commonly known as:  LOPRESSOR Take 12.5 mg by mouth 2 (two) times daily.   MYRBETRIQ 25 MG Tb24 tablet Generic drug:  mirabegron ER Take 25 mg by mouth daily.   nitroGLYCERIN 0.4 MG SL tablet Commonly known as:  NITROSTAT Place 0.4 mg under the tongue every 5 (five) minutes as needed for chest pain.   polyethylene glycol packet Commonly known as:  MIRALAX / GLYCOLAX Take 17 g by mouth daily as needed for mild constipation.    promethazine 25 MG tablet Commonly known as:  PHENERGAN Take 25 mg by mouth every 6 (six) hours as needed for nausea or vomiting.   saccharomyces boulardii 250 MG capsule Commonly known as:  FLORASTOR Take 250 mg by mouth daily.   traMADol 50 MG tablet Commonly known as:  ULTRAM Take 1 tablet (50 mg total) by mouth daily as needed.   warfarin 5 MG tablet Commonly known as:  COUMADIN Take 1 tablet (5 mg total) by mouth daily at 6 PM.         DISCHARGE INSTRUCTIONS:   DIET:  Cardiac diet and Diabetic diet  DISCHARGE CONDITION:  Stable  ACTIVITY:  Activity as tolerated  OXYGEN:  Home Oxygen: No.   Oxygen Delivery: room air  DISCHARGE LOCATION:  Home with [place], PT, Aide.    If you experience worsening of your admission symptoms, develop shortness of breath, life threatening emergency, suicidal or homicidal thoughts you must seek medical  attention immediately by calling 911 or calling your MD immediately  if symptoms less severe.  You Must read complete instructions/literature along with all the possible adverse reactions/side effects for all the Medicines you take and that have been prescribed to you. Take any new Medicines after you have completely understood and accpet all the possible adverse reactions/side effects.   Please note  You were cared for by a hospitalist during your hospital stay. If you have any questions about your discharge medications or the care you received while you were in the hospital after you are discharged, you can call the unit and asked to speak with the hospitalist on call if the hospitalist that took care of you is not available. Once you are discharged, your primary care physician will handle any further medical issues. Please note that NO REFILLS for any discharge medications will be authorized once you are discharged, as it is imperative that you return to your primary care physician (or establish a relationship with a  primary care physician if you do not have one) for your aftercare needs so that they can reassess your need for medications and monitor your lab values.   DATA REVIEW:   CBC  Recent Labs Lab 01/29/17 0626  WBC 4.5  HGB 10.0*  HCT 30.3*  PLT 216    Chemistries   Recent Labs Lab 01/26/17 1112  01/29/17 0626  NA 137  < > 138  K 4.8  < > 4.1  CL 103  < > 101  CO2 21*  < > 30  GLUCOSE 276*  < > 42*  BUN 35*  < > 48*  CREATININE 2.51*  < > 2.50*  CALCIUM 8.4*  < > 8.5*  MG 1.7  --   --   < > = values in this interval not displayed.  Cardiac Enzymes  Recent Labs Lab 01/26/17 2117  TROPONINI 2.61*    RADIOLOGY:  No results found.    Management plans discussed with the patient, family and they are in agreement.  CODE STATUS:  Code Status History    Date Active Date Inactive Code Status Order ID Comments User Context   01/26/2017  2:44 PM 01/29/2017  9:47 PM Full Code [id number]  Loletha Grayer, MD ED   12/28/2016  7:44 AM 12/29/2016  4:34 PM DNR [id number]  Max Sane, MD Inpatient   12/27/2016 11:06 PM 12/28/2016  7:44 AM Full Code [id number]  Harvie Bridge, DO Inpatient    Advance Directive Documentation     Most Recent Value  Type of Advance Directive  Healthcare Power of Attorney  Pre-existing out of facility DNR order (yellow form or pink MOST form)  -  "MOST" Form in Place?  -      TOTAL TIME TAKING CARE OF THIS PATIENT: 40 minutes.    Henreitta Leber M.D on 01/30/2017 at 2:47 PM  Between 7am to 6pm - Pager - [phone]  After 6pm go to [email or web address] - [employer] [facility]  Office  [phone]  CC: Primary care physician; Suann Larry, MD

## 2017-02-01 LAB — GLUCOSE, CAPILLARY
GLUCOSE-CAPILLARY: 40 mg/dL — AB (ref 65–99)
Glucose-Capillary: 43 mg/dL — CL (ref 65–99)

## 2017-02-08 ENCOUNTER — Encounter: Payer: Self-pay | Admitting: Emergency Medicine

## 2017-02-08 ENCOUNTER — Ambulatory Visit (INDEPENDENT_AMBULATORY_CARE_PROVIDER_SITE_OTHER): Payer: Medicare Other | Admitting: Physician Assistant

## 2017-02-08 ENCOUNTER — Encounter: Payer: Self-pay | Admitting: *Deleted

## 2017-02-08 ENCOUNTER — Encounter: Payer: Self-pay | Admitting: Physician Assistant

## 2017-02-08 ENCOUNTER — Emergency Department
Admission: EM | Admit: 2017-02-08 | Discharge: 2017-02-08 | Disposition: A | Discharge status: Home / Self Care | Payer: Medicare Other | Attending: Emergency Medicine | Admitting: Emergency Medicine

## 2017-02-08 VITALS — BP 142/72 | HR 66 | Ht 67.5 in | Wt 164.0 lb

## 2017-02-08 DIAGNOSIS — R791 Abnormal coagulation profile: Secondary | ICD-10-CM | POA: Insufficient documentation

## 2017-02-08 DIAGNOSIS — Z7901 Long term (current) use of anticoagulants: Secondary | ICD-10-CM | POA: Diagnosis not present

## 2017-02-08 DIAGNOSIS — N184 Chronic kidney disease, stage 4 (severe): Secondary | ICD-10-CM | POA: Diagnosis not present

## 2017-02-08 DIAGNOSIS — J449 Chronic obstructive pulmonary disease, unspecified: Secondary | ICD-10-CM | POA: Insufficient documentation

## 2017-02-08 DIAGNOSIS — I82401 Acute embolism and thrombosis of unspecified deep veins of right lower extremity: Secondary | ICD-10-CM

## 2017-02-08 DIAGNOSIS — D689 Coagulation defect, unspecified: Secondary | ICD-10-CM

## 2017-02-08 DIAGNOSIS — I2510 Atherosclerotic heart disease of native coronary artery without angina pectoris: Secondary | ICD-10-CM

## 2017-02-08 DIAGNOSIS — I5042 Chronic combined systolic (congestive) and diastolic (congestive) heart failure: Secondary | ICD-10-CM

## 2017-02-08 DIAGNOSIS — I10 Essential (primary) hypertension: Secondary | ICD-10-CM | POA: Diagnosis not present

## 2017-02-08 DIAGNOSIS — E1122 Type 2 diabetes mellitus with diabetic chronic kidney disease: Secondary | ICD-10-CM | POA: Diagnosis not present

## 2017-02-08 DIAGNOSIS — Z794 Long term (current) use of insulin: Secondary | ICD-10-CM | POA: Insufficient documentation

## 2017-02-08 DIAGNOSIS — G20 Parkinson's disease: Secondary | ICD-10-CM

## 2017-02-08 DIAGNOSIS — R002 Palpitations: Secondary | ICD-10-CM

## 2017-02-08 DIAGNOSIS — Z79899 Other long term (current) drug therapy: Secondary | ICD-10-CM | POA: Insufficient documentation

## 2017-02-08 DIAGNOSIS — I130 Hypertensive heart and chronic kidney disease with heart failure and stage 1 through stage 4 chronic kidney disease, or unspecified chronic kidney disease: Secondary | ICD-10-CM | POA: Insufficient documentation

## 2017-02-08 LAB — BASIC METABOLIC PANEL
ANION GAP: 8 (ref 5–15)
BUN: 41 mg/dL — ABNORMAL HIGH (ref 6–20)
CALCIUM: 8.8 mg/dL — AB (ref 8.9–10.3)
CO2: 24 mmol/L (ref 22–32)
CREATININE: 2.24 mg/dL — AB (ref 0.44–1.00)
Chloride: 108 mmol/L (ref 101–111)
GFR, EST AFRICAN AMERICAN: 23 mL/min — AB (ref >60)
GFR, EST NON AFRICAN AMERICAN: 20 mL/min — AB (ref >60)
Glucose, Bld: 256 mg/dL — ABNORMAL HIGH (ref 65–99)
Potassium: 4.8 mmol/L (ref 3.5–5.1)
Sodium: 140 mmol/L (ref 135–145)

## 2017-02-08 LAB — CBC
HCT: 32.2 % — ABNORMAL LOW (ref 35.0–47.0)
Hemoglobin: 10.3 g/dL — ABNORMAL LOW (ref 12.0–16.0)
MCH: 27.8 pg (ref 26.0–34.0)
MCHC: 32 g/dL (ref 32.0–36.0)
MCV: 87.1 fL (ref 80.0–100.0)
PLATELETS: 200 10*3/uL (ref 150–440)
RBC: 3.7 MIL/uL — ABNORMAL LOW (ref 3.80–5.20)
RDW: 14.5 % (ref 11.5–14.5)
WBC: 5.5 10*3/uL (ref 3.6–11.0)

## 2017-02-08 LAB — PROTIME-INR
INR: 6.6 — AB
Prothrombin Time: 59.7 seconds — ABNORMAL HIGH (ref 11.4–15.2)

## 2017-02-08 LAB — APTT: APTT: 48 s — AB (ref 24–36)

## 2017-02-08 NOTE — ED Triage Notes (Signed)
Pt here from PCP office due to INR is greater than 8. Pt has hx of tremor, hx of parkinsons.

## 2017-02-08 NOTE — Discharge Instructions (Signed)
As discussed please skip tonight's warfarin (coumadin) dose. Please contact your physician to have your INR rechecked tomorrow. Please seek medical attention for any high fevers, chest pain, shortness of breath, change in behavior, persistent vomiting, bloody stool or any other new or concerning symptoms.

## 2017-02-08 NOTE — Progress Notes (Signed)
Cardiology Office Note Date:  02/08/2017  Patient ID:  Molly, Wolf [birth year], MRN [id number] PCP:  Suann Larry, MD  Cardiologist:  Dr. Saunders Revel, MD    Chief Complaint: Hospital follow up  History of Present Illness: Molly Wolf is a 76 y.o. female with history of CAD s/p remote PCI at [facility], chronic combined CHF/pulmonary hypertension, CKD stage IV, Parkinson's disease, DM2, COPD, recent patellar fracture 2/2 mechanical fall with conservative management complicated by lower extremity DVT on Coumadin, HTN, HLD, and anxiety who presents for hospital follow up of recent admission to  [facility] from 4/17 through 4/20 for acute hypoxic respiratory distress requiring BiPAP 2/2 acute on chronic combined CHF and NSTEMI medically managed as below.   Patient was previously followed by [facility] Cardiology undergoing remote PCI. Review of Care Everywhere records does not allow for review of [facility] notes or a cath report. There was a prior echo from 02/07/2008 at [facility] that showed LVEF > 55%, moderate to severe LVH with GR1DD, biatrial enlargement, mild aortic stenosis, trace MR/TR.   Patient admitted on 4/19 with increased SOB with DOE initially requiring BiPAP. She was noted to be hypertensive in the ED with BP 187/30. There was some concern for VT/atrial flutter on telemetry though review of tele and EKG showed sinus rhythm without ectopy. CXR showed CHF with interstitial edema. BNP 2049. Troponin peaked at 2.61. SCr approximately at her baseline at 2.51. Stable anemia. LE ultrasound showed an occlusive thrombus of the right peroneal vein. VQ scan showed low probabliity for PE. Echo showed mild LV dysfunction with EF 45-50%, GR2DD, mild to moderate aortic stenosis, mild aortic insufficieny, mild to moderate mitral regurgitation, mild tricuspid regurgitation, mild biatrial enlargement, PASP > 50 mmHg, and a small pericardial effusion. Medical management was advised for her NSTEMI given her comorbidities  including Parkinson's disease and CKD stage IV. She was evaluated by vascular who did not recommend IVC filter at that time. She was continued on Lovenox to Coumadin bridge at discharge with [facility] to target INR of 2-3. Discharge INR of 1.05 on 4/20 (no further levels for review). She was diuresed with IV Lasix with a net -5 L for the admission and a discharge weight of 162 pounds from a measured weight of 171 pounds during her admission, discharged on Lasix 20 mg daily, along with metoprolol and isosorbide. Discharge SCr 2.50 (baseline approximately 2.4-2.5).   Patient is accompanied with her daughter today. She reports continued fatigue and SOB that were both noted prior to her above hospital admission. Patient's daughter states she has been "moving slower" since her patellar fracture. She does not have a scale at home (lives with her grandson), though does have one in storage. She has not been weighing herself at home. BP has been in the 579U to 383F systolic. HHRN and PT have visited with the patient over the past 10 days intermittently. Patient and her daughter are uncertain if an INR has been checked since her discharge. They do not recall and finger sticks or venipunctures. They both report she was advised by [facility] to stop Lovenox injections (was taking once daily) and continued on Coumadin 5 mg daily. She was to schedule an appointment with a new PCP for management of her INR. No chest pain.    Past Medical History:  Diagnosis Date  . Anxiety   . Arthritis   . Chronic combined systolic and diastolic CHF (congestive heart failure) ([place])    a. 01/2008 Echo ([facility]): EF > 55%,  mod-sev LVH w/ grade 1 DD, biatrial enlargement, mild AS, trace MR/TR; b. EF 45-50%, GR2DD, mild to moderate aortic stenosis, mild aortic insufficieny, mild to moderate mitral regurgitation, mild tricuspid regurgitation, mild biatrial enlargement, and a small pericardial effusion  . CKD (chronic kidney disease), stage IV ([place])   .  Closed patellar sleeve fracture of right knee    a. 01/2017 - conservatively managed.  Marland Kitchen COPD (chronic obstructive pulmonary disease) ([place])   . Coronary artery disease    a. s/p remote stenting of unknown vessel @ [facility].  . Depression   . Diabetes mellitus with complication ([place])   . DVT (deep venous thrombosis) (HCC)    a. in the setting of right patellar fracture on Coumadin  . Hyperlipidemia   . Hypertension   . Parkinson disease [facility])     Past Surgical History:  Procedure Laterality Date  . BACK SURGERY    . CORONARY ANGIOPLASTY WITH STENT PLACEMENT    . NECK SURGERY      Current Outpatient Prescriptions  Medication Sig Dispense Refill  . acetaminophen (TYLENOL) 325 MG tablet Take 650 mg by mouth every 6 (six) hours as needed for mild pain.    Marland Kitchen atorvastatin (LIPITOR) 20 MG tablet Take 20 mg by mouth daily.    . blood glucose meter kit and supplies KIT Dispense based on patient and insurance preference. Use up to four times daily as directed. (FOR ICD-9 250.00, 250.01). 1 each 0  . Carbidopa-Levodopa ER (SINEMET CR) 25-100 MG tablet controlled release Take 2 tablets by mouth 4 (four) times daily.    . Coenzyme Q10 (COQ10) 50 MG CAPS Take 50 mg by mouth daily.    Marland Kitchen CRANBERRY PO Take 1 tablet by mouth daily.    Marland Kitchen escitalopram (LEXAPRO) 20 MG tablet Take 20 mg by mouth daily.    . furosemide (LASIX) 20 MG tablet Take 20 mg by mouth daily.     Marland Kitchen gabapentin (NEURONTIN) 300 MG capsule Take 300 mg by mouth daily.     . hydrALAZINE (APRESOLINE) 50 MG tablet Take 50 mg by mouth 2 (two) times daily.     . insulin glargine (LANTUS) 100 UNIT/ML injection Inject 40 Units into the skin at bedtime.     . insulin lispro (HUMALOG) 100 UNIT/ML injection Inject 6 Units into the skin 3 (three) times daily with meals.     . isosorbide mononitrate (IMDUR) 60 MG 24 hr tablet Take 60 mg by mouth daily.    Marland Kitchen loratadine (CLARITIN) 10 MG tablet Take 5 mg by mouth daily as needed for allergies.     Marland Kitchen  LORazepam (ATIVAN) 1 MG tablet Take 1 tablet (1 mg total) by mouth 2 (two) times daily as needed for anxiety. 5 tablet 0  . metoprolol tartrate (LOPRESSOR) 25 MG tablet Take 12.5 mg by mouth 2 (two) times daily.    . mirabegron ER (MYRBETRIQ) 25 MG TB24 tablet Take 25 mg by mouth daily.    . nitroGLYCERIN (NITROSTAT) 0.4 MG SL tablet Place 0.4 mg under the tongue every 5 (five) minutes as needed for chest pain.    . Omega-3 Fatty Acids (FISH OIL) 1000 MG CAPS Take 1,000 mg by mouth daily.    . polyethylene glycol (MIRALAX / GLYCOLAX) packet Take 17 g by mouth daily as needed for mild constipation.    . promethazine (PHENERGAN) 25 MG tablet Take 25 mg by mouth every 6 (six) hours as needed for nausea or vomiting.    Marland Kitchen  saccharomyces boulardii (FLORASTOR) 250 MG capsule Take 250 mg by mouth daily.    . traMADol (ULTRAM) 50 MG tablet Take 1 tablet (50 mg total) by mouth daily as needed. 5 tablet 0  . warfarin (COUMADIN) 5 MG tablet Take 1 tablet (5 mg total) by mouth daily at 6 PM.     No current facility-administered medications for this visit.     Allergies:   Patient has no known allergies.   Social History:  The patient  reports that she has never smoked. She has never used smokeless tobacco. She reports that she does not drink alcohol or use drugs.   Family History:  The patient's family history includes CAD in her brother and father; Cervical cancer in her mother; Kidney failure in her father.  ROS:   Review of Systems  Constitutional: Positive for malaise/fatigue. Negative for chills, diaphoresis, fever and weight loss.  HENT: Negative for congestion.   Eyes: Negative for discharge and redness.  Respiratory: Positive for shortness of breath. Negative for cough, hemoptysis, sputum production and wheezing.   Cardiovascular: Positive for palpitations. Negative for chest pain, orthopnea, claudication, leg swelling and PND.  Gastrointestinal: Negative for abdominal pain, blood in stool,  heartburn, melena, nausea and vomiting.  Genitourinary: Negative for hematuria.  Musculoskeletal: Negative for falls and myalgias.  Skin: Negative for rash.  Neurological: Positive for tremors and weakness. Negative for dizziness, tingling, sensory change, speech change, focal weakness, seizures, loss of consciousness and headaches.  Endo/Heme/Allergies: Does not bruise/bleed easily.  Psychiatric/Behavioral: Negative for substance abuse. The patient is not nervous/anxious.   All other systems reviewed and are negative.    PHYSICAL EXAM:  VS:  BP (!) 142/72 (BP Location: Left Arm, Patient Position: Sitting, Cuff Size: Normal)   Pulse 66   Ht 5' 7.5" (1.715 m)   Wt 164 lb (74.4 kg)   BMI 25.31 kg/m  BMI: Body mass index is 25.31 kg/m.  Physical Exam  Constitutional: She is oriented to person, place, and time. She appears well-developed and well-nourished.  HENT:  Head: Normocephalic and atraumatic.  Eyes: Right eye exhibits no discharge. Left eye exhibits no discharge.  Neck: Normal range of motion. No JVD present.  Cardiovascular: Normal rate, S1 normal and S2 normal.  An irregular rhythm present. Exam reveals no distant heart sounds, no friction rub, no midsystolic click and no opening snap.   Murmur heard.  Harsh midsystolic murmur is present with a grade of 2/6  at the upper right sternal border radiating to the neck Pulmonary/Chest: Effort normal and breath sounds normal. No respiratory distress. She has no decreased breath sounds. She has no wheezes. She has no rales. She exhibits no tenderness.  Abdominal: Soft. She exhibits no distension. There is no tenderness.  Musculoskeletal: She exhibits no edema.  Neurological: She is alert and oriented to person, place, and time. She displays tremor.  Skin: Skin is warm and dry. No cyanosis. Nails show no clubbing.  Psychiatric: She has a normal mood and affect. Her speech is normal and behavior is normal. Judgment and thought content  normal.     EKG:  Was ordered and interpreted by me today. Shows NSR, 62 bpm, left axis deviation, frequent PVCs, anterolateral TWI  Recent Labs: 12/27/2016: ALT 5 01/26/2017: B Natriuretic Peptide 2,049.0; Magnesium 1.7 01/29/2017: BUN 48; Creatinine, Ser 2.50; Hemoglobin 10.0; Platelets 216; Potassium 4.1; Sodium 138  No results found for requested labs within last 8760 hours.   Estimated Creatinine Clearance: 18.7 mL/min (A) (by  C-G formula based on SCr of 2.5 mg/dL (H)).   Wt Readings from Last 3 Encounters:  02/08/17 164 lb (74.4 kg)  01/29/17 162 lb 8 oz (73.7 kg)  12/27/16 165 lb 9.6 oz (75.1 kg)    Cardiology Studies: TTE (02/07/2008): LVEF >55%, technically limited study, moderate to severe LVH with grade 1 diastolic dysfunction, biatrial enlargement, mild aortic stenosis, trace MR/TR. TTE (01/26/2017): LVEF 45-50%, mild LVH with moderate focal basal hypertrophy of the septum, grade 2 diastolic dysfunction, mild to moderate aortic stenosis with mild aortic insufficiency, mild to moderate mitral regurgitation, mild biatrial enlargement, RV cavity size was mildly dilated with mild reduction in RV systolic function, PASP was moderately to severely increased > 50 mmHg, small pericardial effusion was noted.   Other studies reviewed: Additional studies/records reviewed today include: summarized above  ASSESSMENT AND PLAN:  1. Supratherapeutic INR/coagulopathy: Patient reports she last took Lovenox injection on 4/23 and has continued warfarin 5 mg daily since her hospital discharge on 4/20. Both patient and her daughter are uncertain if an INR had been checked by [facility]. Stat INR checked in the office today read as > 8. Patient without symptoms concerning for bleeding. Sent patient to the ED with office RN for further evaluation and labs per ED MD with likely administration of vitamin K, defer to ED MD.   2. CAD as above with remote PCI at [facility] and recent NSTEMI medically managed: No  symptoms concerning for angina. On Coumadin in place of ASA. Discussed possible nuclear stress testing given her NSTEMI medically managed, patient and her daughter are interested in this at follow up. Continue Imdur, Lopressor, and Lipitor. Consider short course addition of Plavix given her NSTEMI (would be dual therapy with Coumadin).   3. Chronic combined CHF/pulmonary hypertension: Weight in clinic today of 164 pounds. There are faint bibasilar crackles on exam. Suspect given her degree if renal disease she will need a higher dose of Lasix than 20 mg daily. Will await labs that are likely to be checked in the ED and titrate Lasix accordingly.   4. Valvular heart disease: Will need follow up echo in 6-12 months.   5. Palpitations: Asymptomatic. Zio monitor, will be placed after her ED visit as above.   6. CKD stage IV: Referral to nephrology made to establish care with a local nephrologist given her renal disease. Initially planned on cmet today as an outpatient, though I suspect labs will be drawn in the ED as above.   7. Right peroneal DVT: INR found to be supratherapeutic as above. Sent to the ED. After initial management of her supratherapeutic level, her DVT will need to be followed by PCP.   8. HTN: Reasonably controlled. Titrate as needed after her above supratherapeutic INR has been resolved. For now, continue Lopressor, hydralazine, Imdur, and Lasix.   9. Parkinson's disease: Establishing care with Dr. Ouida Sills, MD.   10. COPD: Appears stable. Per PCP.   Disposition: F/u with Dr. Saunders Revel in 1 month.   Current medicines are reviewed at length with the patient today.  The patient did not have any concerns regarding medicines.  Melvern Banker PA-C 02/08/2017 2:32 PM     [place] [place] [address] [place], [place] [address] [phone]

## 2017-02-08 NOTE — Patient Instructions (Addendum)
You will need to proceed to the ED to address your elevated INR level.    Your physician has recommended that you wear an ZIO event monitor. Event monitors are medical devices that record the heart's electrical activity. Doctors most often [place] these monitors to diagnose arrhythmias. Arrhythmias are problems with the speed or rhythm of the heartbeat. The monitor is a small, portable device. You can wear one while you do your normal daily activities. This is usually used to diagnose what is causing palpitations/syncope (passing out). - We will schedule an appointment at a later time after we address your elevated INR in the ED today.     You have been referred to Nephrology. You will be called with an appt date and time from [facility]. If you don't receive a call, please call [phone].  Your physician recommends that you schedule a follow-up appointment in: 1 MONTH WITH DR END.

## 2017-02-08 NOTE — ED Triage Notes (Signed)
Recently discharged from hospital and started on coumadin (due to blood clot).  Today while at PCP, PCO INR done and resulted > 8.  Sent to ED for evaluation.  Curently coumadin dose is 5 mg daily. Last dose last night.

## 2017-02-08 NOTE — ED Provider Notes (Signed)
[facility] Emergency Department Provider Note   ____________________________________________   I have reviewed the triage vital signs and the nursing notes.   HISTORY  Chief Complaint abdormal lab   History limited by: Not Limited   HPI Molly Wolf is a 76 y.o. female who presents to the emergency department today from outpatient doctor's office because of concerns for elevated INR. The patient has been on warfarin for the past week. This would be her first INR check. Per report patient had an INR greater than 8. The patient denies any bleeding. Denies any black or tarry stool. She denies any headache.   Past Medical History:  Diagnosis Date  . Anxiety   . Arthritis   . Chronic combined systolic and diastolic CHF (congestive heart failure) ([place])    a. 01/2008 Echo ([facility]): EF > 55%, mod-sev LVH w/ grade 1 DD, biatrial enlargement, mild AS, trace MR/TR; b. EF 45-50%, GR2DD, mild to moderate aortic stenosis, mild aortic insufficieny, mild to moderate mitral regurgitation, mild tricuspid regurgitation, mild biatrial enlargement, and a small pericardial effusion  . CKD (chronic kidney disease), stage IV ([place])   . Closed patellar sleeve fracture of right knee    a. 01/2017 - conservatively managed.  Marland Kitchen COPD (chronic obstructive pulmonary disease) ([place])   . Coronary artery disease    a. s/p remote stenting of unknown vessel @ [facility].  . Depression   . Diabetes mellitus with complication ([place])   . DVT (deep venous thrombosis) (HCC)    a. in the setting of right patellar fracture on Coumadin  . Hyperlipidemia   . Hypertension   . Parkinson disease [facility])     Patient Active Problem List   Diagnosis Date Noted  . Acute respiratory failure with hypoxia ([place]) 01/26/2017  . Acute respiratory failure ([place]) 01/26/2017  . Intractable pain 12/27/2016    Past Surgical History:  Procedure Laterality Date  . BACK SURGERY    . CORONARY ANGIOPLASTY WITH STENT PLACEMENT     . NECK SURGERY      Prior to Admission medications   Medication Sig Start Date End Date Taking? Authorizing Provider  atorvastatin (LIPITOR) 20 MG tablet Take 20 mg by mouth daily.   Yes Historical Provider, MD  Carbidopa-Levodopa ER (SINEMET CR) 25-100 MG tablet controlled release Take 2 tablets by mouth 4 (four) times daily.   Yes Historical Provider, MD  Coenzyme Q10 (COQ10) 50 MG CAPS Take 50 mg by mouth daily.   Yes Historical Provider, MD  CRANBERRY PO Take 1 tablet by mouth daily.   Yes Historical Provider, MD  escitalopram (LEXAPRO) 20 MG tablet Take 20 mg by mouth daily.   Yes Historical Provider, MD  furosemide (LASIX) 20 MG tablet Take 20 mg by mouth daily.    Yes Historical Provider, MD  gabapentin (NEURONTIN) 300 MG capsule Take 300 mg by mouth daily.    Yes Historical Provider, MD  hydrALAZINE (APRESOLINE) 50 MG tablet Take 50 mg by mouth 2 (two) times daily.    Yes Historical Provider, MD  insulin glargine (LANTUS) 100 UNIT/ML injection Inject 40 Units into the skin at bedtime.    Yes Historical Provider, MD  insulin lispro (HUMALOG) 100 UNIT/ML injection Inject 6 Units into the skin 3 (three) times daily with meals.    Yes Historical Provider, MD  isosorbide mononitrate (IMDUR) 60 MG 24 hr tablet Take 60 mg by mouth daily.   Yes Historical Provider, MD  loratadine (CLARITIN) 10 MG tablet Take 5 mg  by mouth daily as needed for allergies.    Yes Historical Provider, MD  metoprolol tartrate (LOPRESSOR) 25 MG tablet Take 12.5 mg by mouth 2 (two) times daily.   Yes Historical Provider, MD  mirabegron ER (MYRBETRIQ) 25 MG TB24 tablet Take 25 mg by mouth daily.   Yes Historical Provider, MD  nitroGLYCERIN (NITROSTAT) 0.4 MG SL tablet Place 0.4 mg under the tongue every 5 (five) minutes as needed for chest pain.   Yes Historical Provider, MD  Omega-3 Fatty Acids (FISH OIL) 1000 MG CAPS Take 1,000 mg by mouth daily.   Yes Historical Provider, MD  saccharomyces boulardii (FLORASTOR)  250 MG capsule Take 250 mg by mouth daily.   Yes Historical Provider, MD  warfarin (COUMADIN) 5 MG tablet Take 1 tablet (5 mg total) by mouth daily at 6 PM. 01/30/17  Yes Henreitta Leber, MD  acetaminophen (TYLENOL) 325 MG tablet Take 650 mg by mouth every 6 (six) hours as needed for mild pain.    Historical Provider, MD  blood glucose meter kit and supplies KIT Dispense based on patient and insurance preference. Use up to four times daily as directed. (FOR ICD-9 250.00, 250.01). 11/06/16   Rudene Re, MD  LORazepam (ATIVAN) 1 MG tablet Take 1 tablet (1 mg total) by mouth 2 (two) times daily as needed for anxiety. 12/29/16   Max Sane, MD  polyethylene glycol (MIRALAX / GLYCOLAX) packet Take 17 g by mouth daily as needed for mild constipation.    Historical Provider, MD  promethazine (PHENERGAN) 25 MG tablet Take 25 mg by mouth every 6 (six) hours as needed for nausea or vomiting.    Historical Provider, MD  traMADol (ULTRAM) 50 MG tablet Take 1 tablet (50 mg total) by mouth daily as needed. 12/29/16   Max Sane, MD    Allergies Patient has no known allergies.  Family History  Problem Relation Age of Onset  . Cervical cancer Mother   . Kidney failure Father   . CAD Father   . CAD Brother   . Prostate cancer Neg Hx   . Kidney cancer Neg Hx   . Bladder Cancer Neg Hx     Social History Social History  Substance Use Topics  . Smoking status: Never Smoker  . Smokeless tobacco: Never Used  . Alcohol use No    Review of Systems Constitutional: No fever/chills Eyes: No visual changes. ENT: No sore throat. Cardiovascular: Denies chest pain. Respiratory: Denies shortness of breath. Gastrointestinal: No abdominal pain.  No nausea, no vomiting.  No diarrhea.   Genitourinary: Negative for dysuria. Musculoskeletal: Negative for back pain. Skin: Positive for bruising. Neurological: Negative for headaches, focal weakness or  numbness.  ____________________________________________   PHYSICAL EXAM:  VITAL SIGNS: ED Triage Vitals  Enc Vitals Group     BP 02/08/17 1544 (!) 158/94     Pulse Rate 02/08/17 1544 (!) 54     Resp 02/08/17 1544 16     Temp 02/08/17 1544 97.7 F (36.5 C)     Temp Source 02/08/17 1544 Oral     SpO2 02/08/17 1544 97 %     Weight 02/08/17 1545 161 lb (73 kg)     Height 02/08/17 1545 5' 7"  (1.702 m)   Constitutional: Alert and oriented. Well appearing and in no distress. Eyes: Conjunctivae are normal. Normal extraocular movements. ENT   Head: Normocephalic and atraumatic.   Nose: No congestion/rhinnorhea.   Mouth/Throat: Mucous membranes are moist.   Neck: No stridor. Hematological/Lymphatic/Immunilogical:  No cervical lymphadenopathy. Cardiovascular: Normal rate, regular rhythm.  No murmurs, rubs, or gallops.  Respiratory: Normal respiratory effort without tachypnea nor retractions. Breath sounds are clear and equal bilaterally. No wheezes/rales/rhonchi. Gastrointestinal: Soft and non tender. No rebound. No guarding.  Genitourinary: Deferred Musculoskeletal: Normal range of motion in all extremities. No lower extremity edema. Neurologic:  Normal speech and language. No gross focal neurologic deficits are appreciated.  Skin:  Skin is warm, dry and intact. Some bruising noted on upper extremities.  Psychiatric: Mood and affect are normal. Speech and behavior are normal. Patient exhibits appropriate insight and judgment.  ____________________________________________    LABS (pertinent positives/negatives)  Labs Reviewed  CBC - Abnormal; Notable for the following:       Result Value   RBC 3.70 (*)    Hemoglobin 10.3 (*)    HCT 32.2 (*)    All other components within normal limits  BASIC METABOLIC PANEL - Abnormal; Notable for the following:    Glucose, Bld 256 (*)    BUN 41 (*)    Creatinine, Ser 2.24 (*)    Calcium 8.8 (*)    GFR calc non Af Amer 20 (*)     GFR calc Af Amer 23 (*)    All other components within normal limits  PROTIME-INR - Abnormal; Notable for the following:    Prothrombin Time 59.7 (*)    INR 6.60 (*)    All other components within normal limits  APTT - Abnormal; Notable for the following:    aPTT 48 (*)    All other components within normal limits     ____________________________________________   EKG  None  ____________________________________________    RADIOLOGY  None  ____________________________________________   PROCEDURES  Procedures  ____________________________________________   INITIAL IMPRESSION / ASSESSMENT AND PLAN / ED COURSE  Pertinent labs & imaging results that were available during my care of the patient were reviewed by me and considered in my medical decision making (see chart for details).  Patient sent by doctor for elevated INR. INR is elevated to 6. No bleeding. Discussed with patient to hold warfarin and follow up with doctor tomorrow for recheck.   ____________________________________________   FINAL CLINICAL IMPRESSION(S) / ED DIAGNOSES  Final diagnoses:  Elevated INR     Note: This dictation was prepared with Dragon dictation. Any transcriptional errors that result from this process are unintentional     Nance Pear, MD 02/08/17 1842

## 2017-02-08 NOTE — Progress Notes (Signed)
Patient's INR checked while in office today. INR on machine showed >8.0.  Spoke with Eula Listen, PA who advised to send patient to ED for evaluation of ED physician and Vitamin K.  Patient and daughter notified. Patient taken to ED via wheelchair and left with registration with no complaint.

## 2017-02-08 NOTE — ED Notes (Signed)
Date and time results received: 02/08/17 1654 (use smartphrase ".now" to insert current time)  Test: INR Critical Value: 6.6  Name of Provider Notified: goodman

## 2017-02-09 ENCOUNTER — Telehealth: Payer: Self-pay | Admitting: *Deleted

## 2017-02-09 ENCOUNTER — Encounter: Payer: Self-pay | Admitting: Physician Assistant

## 2017-02-09 NOTE — Telephone Encounter (Signed)
Received below message from Eula Listen, [place]:  Editor: Donald Siva (Physician Assistant Certified)    Patient seen in the ED on 4/30 for INR > 8 in the office. ED INR 6.6. She was advised to hold Coumadin at that time and follow up today. Can we please check on her today, to have an INR checked, to document down trending level and make certain she has follow up with Coumadin Clinic on 5/2. Thanks.        Patient needs to have serum INR checked at the [facility] today and appt with Coumadin clinic tomorrow to establish coumadin management. Left message with patient's daughter, on DPR, to call back.

## 2017-02-09 NOTE — Progress Notes (Signed)
Patient seen in the ED on 4/30 for INR > 8 in the office. ED INR 6.6. She was advised to hold Coumadin at that time and follow up today. Can we please check on her today, to have an INR checked, to document down trending level and make certain she has follow up with Coumadin Clinic on 5/2. Thanks.

## 2017-02-09 NOTE — Telephone Encounter (Signed)
No answer. Left message about the need for lab work today and for appt with Coumadin Clinic tomorrow, 5/2//18 at 0900 and to please call back to confirm and discuss.

## 2017-02-09 NOTE — Telephone Encounter (Signed)
No answer. Left message to call back.   

## 2017-02-10 ENCOUNTER — Ambulatory Visit (INDEPENDENT_AMBULATORY_CARE_PROVIDER_SITE_OTHER): Payer: Medicare Other | Admitting: *Deleted

## 2017-02-10 ENCOUNTER — Ambulatory Visit (INDEPENDENT_AMBULATORY_CARE_PROVIDER_SITE_OTHER): Payer: Medicare Other

## 2017-02-10 DIAGNOSIS — I824Y1 Acute embolism and thrombosis of unspecified deep veins of right proximal lower extremity: Secondary | ICD-10-CM

## 2017-02-10 DIAGNOSIS — R002 Palpitations: Secondary | ICD-10-CM

## 2017-02-10 DIAGNOSIS — I824Y9 Acute embolism and thrombosis of unspecified deep veins of unspecified proximal lower extremity: Secondary | ICD-10-CM | POA: Insufficient documentation

## 2017-02-10 LAB — POCT INR: INR: 4.7

## 2017-02-10 NOTE — Telephone Encounter (Signed)
Received incoming call from patient's daughter. She said her phone does not get good reception and does not receive every call or message. Patient has been scheduled to come in later today for INR check and for ZIO  Monitor placement.

## 2017-02-10 NOTE — Telephone Encounter (Signed)
No answer. Left detail message with appt today at 0900 and the importance of checking patient's INR today, ok per DPR, and to call back to confirm she received the message.

## 2017-02-15 ENCOUNTER — Observation Stay
Admission: EM | Admit: 2017-02-15 | Discharge: 2017-02-16 | Disposition: A | Discharge status: Home / Self Care | Payer: Medicare Other | Attending: Internal Medicine | Admitting: Internal Medicine

## 2017-02-15 ENCOUNTER — Emergency Department: Payer: Medicare Other

## 2017-02-15 ENCOUNTER — Encounter: Payer: Self-pay | Admitting: Emergency Medicine

## 2017-02-15 DIAGNOSIS — J449 Chronic obstructive pulmonary disease, unspecified: Secondary | ICD-10-CM | POA: Diagnosis not present

## 2017-02-15 DIAGNOSIS — E1122 Type 2 diabetes mellitus with diabetic chronic kidney disease: Secondary | ICD-10-CM | POA: Diagnosis not present

## 2017-02-15 DIAGNOSIS — F419 Anxiety disorder, unspecified: Secondary | ICD-10-CM | POA: Diagnosis not present

## 2017-02-15 DIAGNOSIS — I081 Rheumatic disorders of both mitral and tricuspid valves: Secondary | ICD-10-CM | POA: Insufficient documentation

## 2017-02-15 DIAGNOSIS — Z7901 Long term (current) use of anticoagulants: Secondary | ICD-10-CM | POA: Diagnosis not present

## 2017-02-15 DIAGNOSIS — Z79899 Other long term (current) drug therapy: Secondary | ICD-10-CM | POA: Diagnosis not present

## 2017-02-15 DIAGNOSIS — N184 Chronic kidney disease, stage 4 (severe): Secondary | ICD-10-CM | POA: Diagnosis not present

## 2017-02-15 DIAGNOSIS — I2510 Atherosclerotic heart disease of native coronary artery without angina pectoris: Secondary | ICD-10-CM | POA: Insufficient documentation

## 2017-02-15 DIAGNOSIS — I130 Hypertensive heart and chronic kidney disease with heart failure and stage 1 through stage 4 chronic kidney disease, or unspecified chronic kidney disease: Secondary | ICD-10-CM | POA: Diagnosis not present

## 2017-02-15 DIAGNOSIS — J9601 Acute respiratory failure with hypoxia: Principal | ICD-10-CM | POA: Insufficient documentation

## 2017-02-15 DIAGNOSIS — E785 Hyperlipidemia, unspecified: Secondary | ICD-10-CM | POA: Diagnosis not present

## 2017-02-15 DIAGNOSIS — R7989 Other specified abnormal findings of blood chemistry: Secondary | ICD-10-CM

## 2017-02-15 DIAGNOSIS — J9600 Acute respiratory failure, unspecified whether with hypoxia or hypercapnia: Secondary | ICD-10-CM | POA: Diagnosis present

## 2017-02-15 DIAGNOSIS — Z955 Presence of coronary angioplasty implant and graft: Secondary | ICD-10-CM | POA: Insufficient documentation

## 2017-02-15 DIAGNOSIS — R778 Other specified abnormalities of plasma proteins: Secondary | ICD-10-CM

## 2017-02-15 DIAGNOSIS — Z86718 Personal history of other venous thrombosis and embolism: Secondary | ICD-10-CM | POA: Diagnosis not present

## 2017-02-15 DIAGNOSIS — Z794 Long term (current) use of insulin: Secondary | ICD-10-CM | POA: Diagnosis not present

## 2017-02-15 DIAGNOSIS — I5043 Acute on chronic combined systolic (congestive) and diastolic (congestive) heart failure: Secondary | ICD-10-CM | POA: Insufficient documentation

## 2017-02-15 DIAGNOSIS — G20 Parkinson's disease: Secondary | ICD-10-CM | POA: Diagnosis not present

## 2017-02-15 DIAGNOSIS — R0602 Shortness of breath: Secondary | ICD-10-CM | POA: Diagnosis present

## 2017-02-15 DIAGNOSIS — R0600 Dyspnea, unspecified: Secondary | ICD-10-CM

## 2017-02-15 DIAGNOSIS — F329 Major depressive disorder, single episode, unspecified: Secondary | ICD-10-CM | POA: Insufficient documentation

## 2017-02-15 DIAGNOSIS — R791 Abnormal coagulation profile: Secondary | ICD-10-CM

## 2017-02-15 LAB — CBC
HCT: 32.2 % — ABNORMAL LOW (ref 35.0–47.0)
HEMOGLOBIN: 10.9 g/dL — AB (ref 12.0–16.0)
MCH: 28.8 pg (ref 26.0–34.0)
MCHC: 33.7 g/dL (ref 32.0–36.0)
MCV: 85.5 fL (ref 80.0–100.0)
PLATELETS: 248 10*3/uL (ref 150–440)
RBC: 3.77 MIL/uL — AB (ref 3.80–5.20)
RDW: 15.3 % — AB (ref 11.5–14.5)
WBC: 7.3 10*3/uL (ref 3.6–11.0)

## 2017-02-15 LAB — HEPATIC FUNCTION PANEL
ALBUMIN: 3.5 g/dL (ref 3.5–5.0)
ALK PHOS: 73 U/L (ref 38–126)
ALT: <5 U/L — ABNORMAL LOW (ref 14–54)
AST: 15 U/L (ref 15–41)
BILIRUBIN TOTAL: 1.2 mg/dL (ref 0.3–1.2)
Bilirubin, Direct: 0.2 mg/dL (ref 0.1–0.5)
Indirect Bilirubin: 1 mg/dL — ABNORMAL HIGH (ref 0.3–0.9)
Total Protein: 6.9 g/dL (ref 6.5–8.1)

## 2017-02-15 LAB — BASIC METABOLIC PANEL
ANION GAP: 8 (ref 5–15)
BUN: 27 mg/dL — ABNORMAL HIGH (ref 6–20)
CHLORIDE: 111 mmol/L (ref 101–111)
CO2: 23 mmol/L (ref 22–32)
CREATININE: 1.98 mg/dL — AB (ref 0.44–1.00)
Calcium: 9.1 mg/dL (ref 8.9–10.3)
GFR calc non Af Amer: 23 mL/min — ABNORMAL LOW (ref >60)
GFR, EST AFRICAN AMERICAN: 27 mL/min — AB (ref >60)
Glucose, Bld: 79 mg/dL (ref 65–99)
Potassium: 4 mmol/L (ref 3.5–5.1)
SODIUM: 142 mmol/L (ref 135–145)

## 2017-02-15 LAB — PROTIME-INR
INR: 9.26
Prothrombin Time: 78.3 seconds — ABNORMAL HIGH (ref 11.4–15.2)

## 2017-02-15 LAB — TROPONIN I: Troponin I: 0.06 ng/mL (ref <0.03)

## 2017-02-15 LAB — GLUCOSE, CAPILLARY: Glucose-Capillary: 121 mg/dL — ABNORMAL HIGH (ref 65–99)

## 2017-02-15 MED ORDER — POLYETHYLENE GLYCOL 3350 17 G PO PACK: 17.0000 g | PACK | Freq: Every day | ORAL | Status: DC | PRN

## 2017-02-15 MED ORDER — IPRATROPIUM-ALBUTEROL 0.5-2.5 (3) MG/3ML IN SOLN
3.0000 mL | Freq: Four times a day (QID) | RESPIRATORY_TRACT | Status: DC
  Administered 2017-02-16 (×2): 3 mL via RESPIRATORY_TRACT
  Filled 2017-02-15 (×2): qty 3

## 2017-02-15 MED ORDER — FUROSEMIDE 40 MG PO TABS
20.0000 mg | ORAL_TABLET | Freq: Once | ORAL | Status: AC
  Administered 2017-02-15: 20 mg via ORAL
  Filled 2017-02-15: qty 1

## 2017-02-15 MED ORDER — PROMETHAZINE HCL 25 MG PO TABS: 25.0000 mg | ORAL_TABLET | Freq: Four times a day (QID) | ORAL | Status: DC | PRN

## 2017-02-15 MED ORDER — HEPARIN SODIUM (PORCINE) 5000 UNIT/ML IJ SOLN: 5000.0000 [IU] | Freq: Three times a day (TID) | INTRAMUSCULAR | Status: DC

## 2017-02-15 MED ORDER — ISOSORBIDE MONONITRATE ER 60 MG PO TB24
60.0000 mg | ORAL_TABLET | Freq: Every day | ORAL | Status: DC
  Administered 2017-02-16: 60 mg via ORAL
  Filled 2017-02-15: qty 1

## 2017-02-15 MED ORDER — TRAMADOL HCL 50 MG PO TABS: 50.0000 mg | ORAL_TABLET | Freq: Two times a day (BID) | ORAL | Status: DC | PRN

## 2017-02-15 MED ORDER — IPRATROPIUM-ALBUTEROL 0.5-2.5 (3) MG/3ML IN SOLN
3.0000 mL | RESPIRATORY_TRACT | Status: DC
  Administered 2017-02-15: 3 mL via RESPIRATORY_TRACT
  Filled 2017-02-15: qty 3

## 2017-02-15 MED ORDER — ATORVASTATIN CALCIUM 20 MG PO TABS
20.0000 mg | ORAL_TABLET | Freq: Every day | ORAL | Status: DC
  Administered 2017-02-15 – 2017-02-16 (×2): 20 mg via ORAL
  Filled 2017-02-15 (×2): qty 1

## 2017-02-15 MED ORDER — LORATADINE 10 MG PO TABS: 5.0000 mg | ORAL_TABLET | Freq: Every day | ORAL | Status: DC | PRN

## 2017-02-15 MED ORDER — IPRATROPIUM-ALBUTEROL 0.5-2.5 (3) MG/3ML IN SOLN
3.0000 mL | Freq: Once | RESPIRATORY_TRACT | Status: AC
  Administered 2017-02-15: 3 mL via RESPIRATORY_TRACT
  Filled 2017-02-15: qty 3

## 2017-02-15 MED ORDER — SACCHAROMYCES BOULARDII 250 MG PO CAPS
250.0000 mg | ORAL_CAPSULE | Freq: Every day | ORAL | Status: DC
  Administered 2017-02-16: 250 mg via ORAL
  Filled 2017-02-15: qty 1

## 2017-02-15 MED ORDER — MIRABEGRON ER 25 MG PO TB24
25.0000 mg | ORAL_TABLET | Freq: Every day | ORAL | Status: DC
  Administered 2017-02-16: 25 mg via ORAL
  Filled 2017-02-15: qty 1

## 2017-02-15 MED ORDER — GABAPENTIN 300 MG PO CAPS
300.0000 mg | ORAL_CAPSULE | Freq: Every day | ORAL | Status: DC
  Administered 2017-02-16: 300 mg via ORAL
  Filled 2017-02-15: qty 1

## 2017-02-15 MED ORDER — CARBIDOPA-LEVODOPA ER 25-100 MG PO TBCR
2.0000 | EXTENDED_RELEASE_TABLET | Freq: Four times a day (QID) | ORAL | Status: DC
  Administered 2017-02-15 – 2017-02-16 (×2): 2 via ORAL
  Filled 2017-02-15 (×7): qty 2

## 2017-02-15 MED ORDER — FUROSEMIDE 10 MG/ML IJ SOLN
20.0000 mg | Freq: Two times a day (BID) | INTRAMUSCULAR | Status: DC
  Administered 2017-02-15 – 2017-02-16 (×2): 20 mg via INTRAVENOUS
  Filled 2017-02-15 (×2): qty 2

## 2017-02-15 MED ORDER — ESCITALOPRAM OXALATE 10 MG PO TABS
20.0000 mg | ORAL_TABLET | Freq: Every day | ORAL | Status: DC
  Administered 2017-02-16: 20 mg via ORAL
  Filled 2017-02-15: qty 2

## 2017-02-15 MED ORDER — LORAZEPAM 1 MG PO TABS: 1.0000 mg | ORAL_TABLET | Freq: Two times a day (BID) | ORAL | Status: DC | PRN

## 2017-02-15 MED ORDER — INSULIN GLARGINE 100 UNIT/ML ~~LOC~~ SOLN
20.0000 [IU] | Freq: Every day | SUBCUTANEOUS | Status: DC
  Administered 2017-02-15: 20 [IU] via SUBCUTANEOUS
  Filled 2017-02-15 (×3): qty 0.2

## 2017-02-15 MED ORDER — INSULIN ASPART 100 UNIT/ML ~~LOC~~ SOLN
0.0000 [IU] | Freq: Three times a day (TID) | SUBCUTANEOUS | Status: DC
  Administered 2017-02-16: 1 [IU] via SUBCUTANEOUS
  Administered 2017-02-16: 2 [IU] via SUBCUTANEOUS
  Filled 2017-02-15: qty 1
  Filled 2017-02-15: qty 2

## 2017-02-15 MED ORDER — DOCUSATE SODIUM 100 MG PO CAPS: 100.0000 mg | ORAL_CAPSULE | Freq: Two times a day (BID) | ORAL | Status: DC | PRN

## 2017-02-15 NOTE — ED Notes (Signed)
Pt presents stating she feels like she can't breathe. She states that her lung has fluid in it. She knows because EMS personnel said she sounded "wet." Pt is breathing evenly, non-labored at this time. NAD noted.

## 2017-02-15 NOTE — ED Notes (Signed)
Patient is complaining of increased shortness of breath.  This RN reassessed patient and vital signs.  Discussed plan of care and reassured patient.  Patient is within this RN's vision.

## 2017-02-15 NOTE — ED Triage Notes (Signed)
Pt presents to ED from home via EMS c/o SOB since a few hours ago . " I just can't breath". Pt able to communicate in complete sentences. Underlying tremors present.

## 2017-02-15 NOTE — Progress Notes (Signed)
ANTICOAGULATION CONSULT NOTE - Initial Consult  Pharmacy Consult for warfarin Indication: VTE treatment  No Known Allergies  Patient Measurements: Height: 5\' 7"  (170.2 cm) Weight: 161 lb (73 kg) IBW/kg (Calculated) : 61.6 Heparin Dosing Weight:   Vital Signs: Temp: 97.7 F (36.5 C) (05/07 1249) Temp Source: Oral (05/07 1249) BP: 96/50 (05/07 1500) Pulse Rate: 77 (05/07 1500)  Labs:  Recent Labs  02/15/17 1259  HGB 10.9*  HCT 32.2*  PLT 248  LABPROT 78.3*  INR 9.26*  CREATININE 1.98*  TROPONINI 0.06*    Estimated Creatinine Clearance: 23.1 mL/min (A) (by C-G formula based on SCr of 1.98 mg/dL (H)).   Medical History: Past Medical History:  Diagnosis Date  . Anxiety   . Arthritis   . Chronic combined systolic and diastolic CHF (congestive heart failure) (HCC)    a. 01/2008 Echo ([facility]): EF > 55%, mod-sev LVH w/ grade 1 DD, biatrial enlargement, mild AS, trace MR/TR; b. EF 45-50%, GR2DD, mild to moderate aortic stenosis, mild aortic insufficieny, mild to moderate mitral regurgitation, mild tricuspid regurgitation, mild biatrial enlargement, and a small pericardial effusion  . CKD (chronic kidney disease), stage IV (HCC)   . Closed patellar sleeve fracture of right knee    a. 01/2017 - conservatively managed.  Marland Kitchen. COPD (chronic obstructive pulmonary disease) (HCC)   . Coronary artery disease    a. s/p remote stenting of unknown vessel @ [facility].  . Depression   . Diabetes mellitus with complication (HCC)   . DVT (deep venous thrombosis) (HCC)    a. in the setting of right patellar fracture on Coumadin  . Hyperlipidemia   . Hypertension   . Parkinson disease (HCC)     Medications:  Infusions:    Assessment: 76 yof cc SOB on VKA for DVT last admission - CXR negative in ED today. INR > 9 on admission but no s/sx bleeding so plan is to hold VKA and trend INR.   Goal of Therapy:  INR 2-3 Monitor platelets by anticoagulation protocol: Yes   Plan:  Hold VKA. Check  INR daily.  Carola FrostNathan A Aleksei Goodlin, Pharm.D., BCPS Clinical Pharmacist 02/15/2017,4:45 PM

## 2017-02-15 NOTE — Progress Notes (Signed)
Family Meeting Note  Advance Directive:yes  Today a meeting took place with the Patient and daughter.  The following clinical team members were present during this meeting:MD  The following were discussed:Patient's diagnosis:DVT, CHF, COPD , Patient's progosis: Unable to determine and Goals for treatment: Continue present management  Additional follow-up to be provided: PMD  Time spent during discussion:20 minutes  Molly Wolf, Heath GoldVAIBHAVKUMAR, MD

## 2017-02-15 NOTE — ED Notes (Signed)
Patient given [place] sandwich tray and milk. No further needs expressed at this time.

## 2017-02-15 NOTE — ED Provider Notes (Signed)
[facility] Emergency Department Provider Note       Time seen: ----------------------------------------- 2:22 PM on 02/15/2017 -----------------------------------------     I have reviewed the triage vital signs and the nursing notes.   HISTORY   Chief Complaint Shortness of Breath (hx heart failure)    HPI Molly Wolf is a 77 y.o. female who presents to the ED for shortness of breath since a few hours ago. Patient reports she can't breathe. Patient was seen by her primary care doctor and diagnosed with a COPD exacerbation. Patient states medications really haven't helped. She does not have any pain at this time, EMS reported that her lungs sounded wet.   Past Medical History:  Diagnosis Date  . Anxiety   . Arthritis   . Chronic combined systolic and diastolic CHF (congestive heart failure) (HCC)    a. 01/2008 Echo ([facility]): EF > 55%, mod-sev LVH w/ grade 1 DD, biatrial enlargement, mild AS, trace MR/TR; b. EF 45-50%, GR2DD, mild to moderate aortic stenosis, mild aortic insufficieny, mild to moderate mitral regurgitation, mild tricuspid regurgitation, mild biatrial enlargement, and a small pericardial effusion  . CKD (chronic kidney disease), stage IV (HCC)   . Closed patellar sleeve fracture of right knee    a. 01/2017 - conservatively managed.  Marland Kitchen COPD (chronic obstructive pulmonary disease) (HCC)   . Coronary artery disease    a. s/p remote stenting of unknown vessel @ [facility].  . Depression   . Diabetes mellitus with complication (HCC)   . DVT (deep venous thrombosis) (HCC)    a. in the setting of right patellar fracture on Coumadin  . Hyperlipidemia   . Hypertension   . Parkinson disease [facility])     Patient Active Problem List   Diagnosis Date Noted  . Acute venous embolism and thrombosis of deep vessels of proximal lower extremity (HCC) [I82.4Y9] 02/10/2017  . Acute respiratory failure with hypoxia (HCC) 01/26/2017  . Acute respiratory failure  (HCC) 01/26/2017  . Intractable pain 12/27/2016    Past Surgical History:  Procedure Laterality Date  . BACK SURGERY    . CORONARY ANGIOPLASTY WITH STENT PLACEMENT    . NECK SURGERY      Allergies Patient has no known allergies.  Social History Social History  Substance Use Topics  . Smoking status: Never Smoker  . Smokeless tobacco: Never Used  . Alcohol use No    Review of Systems Constitutional: Negative for fever. Eyes: Negative for vision changes ENT:  Negative for congestion, sore throat Cardiovascular: Negative for chest pain. Respiratory: Positive for shortness of breath Gastrointestinal: Negative for abdominal pain, vomiting and diarrhea. Genitourinary: Negative for dysuria. Musculoskeletal: Negative for back pain. Skin: Negative for rash. Neurological: Negative for headaches, focal weakness or numbness.  All systems negative/normal/unremarkable except as stated in the HPI  ____________________________________________   PHYSICAL EXAM:  VITAL SIGNS: ED Triage Vitals [02/15/17 1249]  Enc Vitals Group     BP 140/84     Pulse Rate 78     Resp (!) 22     Temp 97.7 F (36.5 C)     Temp Source Oral     SpO2 96 %     Weight 161 lb (73 kg)     Height 5\' 7"  (1.702 m)     Head Circumference      Peak Flow      Pain Score      Pain Loc      Pain Edu?  Excl. in GC?     Constitutional: Alert and oriented. Well appearing and in no distress. Eyes: Conjunctivae are normal. PERRL. Normal extraocular movements. ENT   Head: Normocephalic and atraumatic.   Nose: No congestion/rhinnorhea.   Mouth/Throat: Mucous membranes are moist.   Neck: No stridor. Cardiovascular: Normal rate, regular rhythm. No murmurs, rubs, or gallops. Respiratory: Normal respiratory effort without tachypnea nor retractions. Breath sounds are clear and equal bilaterally. No wheezes/rales/rhonchi. Gastrointestinal: Soft and nontender. Normal bowel sounds Musculoskeletal:  Nontender with normal range of motion in extremities. No lower extremity tenderness nor edema. Neurologic:  Normal speech and language. No gross focal neurologic deficits are appreciated.  Skin:  Skin is warm, dry and intact. No rash noted. Psychiatric: Mood and affect are normal. Speech and behavior are normal.  ____________________________________________  EKG: Interpreted by me.Sinus rhythm with rate 80 bpm, normal PR interval, long QT, LVH with repolarization abnormality  ____________________________________________  ED COURSE:  Pertinent labs & imaging results that were available during my care of the patient were reviewed by me and considered in my medical decision making (see chart for details). Patient presents for dyspnea, we will assess with labs and imaging as indicated.   Procedures ____________________________________________   LABS (pertinent positives/negatives)  Labs Reviewed  BASIC METABOLIC PANEL - Abnormal; Notable for the following:       Result Value   BUN 27 (*)    Creatinine, Ser 1.98 (*)    GFR calc non Af Amer 23 (*)    GFR calc Af Amer 27 (*)    All other components within normal limits  CBC - Abnormal; Notable for the following:    RBC 3.77 (*)    Hemoglobin 10.9 (*)    HCT 32.2 (*)    RDW 15.3 (*)    All other components within normal limits  TROPONIN I - Abnormal; Notable for the following:    Troponin I 0.06 (*)    All other components within normal limits  PROTIME-INR - Abnormal; Notable for the following:    Prothrombin Time 78.3 (*)    INR 9.26 (*)    All other components within normal limits  HEPATIC FUNCTION PANEL    RADIOLOGY  Chest x-ray IMPRESSION: Cardiomegaly with small pleural effusions but no acute pulmonary edema.  ____________________________________________  FINAL ASSESSMENT AND PLAN  Dyspnea, elevated troponin, elevated INR without bleeding  Plan: Patient's labs and imaging were dictated above. Patient had  presented for shortness of breath of uncertain etiology. We did give her a DuoNeb and she does have a pleural effusion seen on x-ray. She has started back taking her Coumadin and we will need to hold this and monitor for bleeding. Family was requesting admission for further evaluation.   Emily FilbertWilliams, Veniamin Kincaid E, MD   Note: This note was generated in part or whole with voice recognition software. Voice recognition is usually quite accurate but there are transcription errors that can and very often do occur. I apologize for any typographical errors that were not detected and corrected.     Emily FilbertWilliams, Brianah Hopson E, MD 02/15/17 [phone]

## 2017-02-15 NOTE — H&P (Signed)
[facility] NAME: Molly Wolf    MR#:  [id number]  DATE OF BIRTH:  September 25, 1941  DATE OF ADMISSION:  02/15/2017  PRIMARY CARE PHYSICIAN: Bergert, Luther Redo, MD   REQUESTING/REFERRING PHYSICIAN: Gwyndolyn Saxon  CHIEF COMPLAINT:   Chief Complaint  Patient presents with  . Shortness of Breath    hx heart failure    HISTORY OF PRESENT ILLNESS: Molly Wolf  is a 76 y.o. female with a known history of Anxiety, chronic combined systolic and diastolic heart failure, chronic kidney disease, COPD,, coronary artery disease, depression, diabetes, DVT, hypertension among Parkinson's disease - was recently admitted to hospital for respiratory failure and treated for CHF and started on warfarin for her DVT. Since then she had her INR checked 2-3 times where her level was found higher and see still undergoing adjustment of her warfarin doses. Today morning suddenly she started feeling very short of breath and so came to emergency room. She denies any associated wheezing or fever or chills. She has some sputum production. She denies edema on her legs. Her INR was noted more than 9 but she does not have active bleeding currently.   PAST MEDICAL HISTORY:   Past Medical History:  Diagnosis Date  . Anxiety   . Arthritis   . Chronic combined systolic and diastolic CHF (congestive heart failure) ([place])    a. 01/2008 Echo ([facility]): EF > 55%, mod-sev LVH w/ grade 1 DD, biatrial enlargement, mild AS, trace MR/TR; b. EF 45-50%, GR2DD, mild to moderate aortic stenosis, mild aortic insufficieny, mild to moderate mitral regurgitation, mild tricuspid regurgitation, mild biatrial enlargement, and a small pericardial effusion  . CKD (chronic kidney disease), stage IV ([place])   . Closed patellar sleeve fracture of right knee    a. 01/2017 - conservatively managed.  Marland Kitchen COPD (chronic obstructive pulmonary disease) ([place])   . Coronary artery disease    a. s/p remote stenting of unknown  vessel @ [facility].  . Depression   . Diabetes mellitus with complication ([place])   . DVT (deep venous thrombosis) ([facility])    a. in the setting of right patellar fracture on Coumadin  . Hyperlipidemia   . Hypertension   . Parkinson disease ([place])     PAST SURGICAL HISTORY: Past Surgical History:  Procedure Laterality Date  . BACK SURGERY    . CORONARY ANGIOPLASTY WITH STENT PLACEMENT    . NECK SURGERY      SOCIAL HISTORY:  Social History  Substance Use Topics  . Smoking status: Never Smoker  . Smokeless tobacco: Never Used  . Alcohol use No    FAMILY HISTORY:  Family History  Problem Relation Age of Onset  . Cervical cancer Mother   . Kidney failure Father   . CAD Father   . CAD Brother   . Prostate cancer Neg Hx   . Kidney cancer Neg Hx   . Bladder Cancer Neg Hx     DRUG ALLERGIES: No Known Allergies  REVIEW OF SYSTEMS:   CONSTITUTIONAL: No fever, fatigue or weakness.  EYES: No blurred or double vision.  EARS, NOSE, AND THROAT: No tinnitus or ear pain.  RESPIRATORY: No cough, positive for  shortness of breath, no  wheezing or hemoptysis.  CARDIOVASCULAR: No chest pain, orthopnea, edema.  GASTROINTESTINAL: No nausea, vomiting, diarrhea or abdominal pain.  GENITOURINARY: No dysuria, hematuria.  ENDOCRINE: No polyuria, nocturia,  HEMATOLOGY: No anemia, easy bruising or bleeding SKIN: No rash or lesion. MUSCULOSKELETAL: No  joint pain or arthritis.   NEUROLOGIC: No tingling, numbness, weakness.  PSYCHIATRY: No anxiety or depression.   MEDICATIONS AT HOME:  Prior to Admission medications   Medication Sig Start Date End Date Taking? Authorizing Provider  acetaminophen (TYLENOL) 325 MG tablet Take 650 mg by mouth every 6 (six) hours as needed for mild pain.   Yes [provider]  atorvastatin (LIPITOR) 20 MG tablet Take 20 mg by mouth daily.   Yes [provider]  Carbidopa-Levodopa ER (SINEMET CR) 25-100 MG tablet controlled release Take 2 tablets by  mouth 4 (four) times daily.   Yes [provider]  Coenzyme Q10 (COQ10) 50 MG CAPS Take 50 mg by mouth daily.   Yes [provider]  CRANBERRY PO Take 1 tablet by mouth daily.   Yes [provider]  escitalopram (LEXAPRO) 20 MG tablet Take 20 mg by mouth daily.   Yes [provider]  furosemide (LASIX) 20 MG tablet Take 20 mg by mouth daily.    Yes [provider]  gabapentin (NEURONTIN) 300 MG capsule Take 300 mg by mouth daily.    Yes [provider]  hydrALAZINE (APRESOLINE) 50 MG tablet Take 50 mg by mouth 2 (two) times daily.    Yes [provider]  insulin glargine (LANTUS) 100 UNIT/ML injection Inject 40 Units into the skin at bedtime.    Yes [provider]  insulin lispro (HUMALOG) 100 UNIT/ML injection Inject 6 Units into the skin 3 (three) times daily with meals.    Yes [provider]  isosorbide mononitrate (IMDUR) 60 MG 24 hr tablet Take 60 mg by mouth daily.   Yes [provider]  loratadine (CLARITIN) 10 MG tablet Take 5 mg by mouth daily as needed for allergies.    Yes [provider]  LORazepam (ATIVAN) 1 MG tablet Take 1 tablet (1 mg total) by mouth 2 (two) times daily as needed for anxiety. 12/29/16  Yes Max Sane, MD  metoprolol tartrate (LOPRESSOR) 25 MG tablet Take 12.5 mg by mouth 2 (two) times daily.   Yes [provider]  mirabegron ER (MYRBETRIQ) 25 MG TB24 tablet Take 25 mg by mouth daily.   Yes [provider]  nitroGLYCERIN (NITROSTAT) 0.4 MG SL tablet Place 0.4 mg under the tongue every 5 (five) minutes as needed for chest pain.   Yes [provider]  Omega-3 Fatty Acids (FISH OIL) 1000 MG CAPS Take 1,000 mg by mouth daily.   Yes [provider]  polyethylene glycol (MIRALAX / GLYCOLAX) packet Take 17 g by mouth daily as needed for mild constipation.   Yes [provider]  promethazine (PHENERGAN) 25 MG tablet Take 25 mg by  mouth every 6 (six) hours as needed for nausea or vomiting.   Yes [provider]  saccharomyces boulardii (FLORASTOR) 250 MG capsule Take 250 mg by mouth daily.   Yes [provider]  traMADol (ULTRAM) 50 MG tablet Take 1 tablet (50 mg total) by mouth daily as needed. 12/29/16  Yes Max Sane, MD  warfarin (COUMADIN) 5 MG tablet Take 1 tablet (5 mg total) by mouth daily at 6 PM. Patient taking differently: Take 2.5-5 mg by mouth daily at 6 PM. Sun-2.39m, mon-529m tue-2.23m66mWed-none, thurs-2.23mg81mri-23mg,82mt-2.23mg 460m/18  Yes Sainani, Vivek Belia Hemanblood glucose meter kit and supplies KIT Dispense based on patient and insurance preference. Use up to four times daily as directed. (FOR ICD-9 250.00, 250.01). 11/06/16   VeroneAlfred Levins  [place], MD      PHYSICAL EXAMINATION:   VITAL SIGNS: Blood pressure (!) 96/50, pulse 77, temperature 97.7 F (36.5 C), temperature source Oral, resp. rate 19, height 5' 7"  (1.702 m), weight 73 kg (161 lb), SpO2 95 %.  GENERAL:  76 y.o.-year-old patient lying in the bed with no acute distress.  EYES: Pupils equal, round, reactive to light and accommodation. No scleral icterus. Extraocular muscles intact.  HEENT: Head atraumatic, normocephalic. Oropharynx and nasopharynx clear.  NECK:  Supple, no jugular venous distention. No thyroid enlargement, no tenderness.  LUNGS: Normal breath sounds bilaterally, no wheezing, bilateral crepitation . No use of accessory muscles of respiration.  CARDIOVASCULAR: S1, S2 normal. No murmurs, rubs, or gallops.  ABDOMEN: Soft, nontender, nondistended. Bowel sounds present. No organomegaly or mass.  EXTREMITIES: No pedal edema, cyanosis, or clubbing.  NEUROLOGIC: Cranial nerves II through XII are intact. Muscle strength 4/5 in all extremities. Sensation intact. Gait not checked.  excessive shaking movement of her upper extremities and her head is present.  PSYCHIATRIC: The patient is alert and oriented x 3.  SKIN:  No obvious rash, lesion, or ulcer.   LABORATORY PANEL:   CBC  Recent Labs Lab 02/15/17 1259  WBC 7.3  HGB 10.9*  HCT 32.2*  PLT 248  MCV 85.5  MCH 28.8  MCHC 33.7  RDW 15.3*   ------------------------------------------------------------------------------------------------------------------  Chemistries   Recent Labs Lab 02/15/17 1259  NA 142  K 4.0  CL 111  CO2 23  GLUCOSE 79  BUN 27*  CREATININE 1.98*  CALCIUM 9.1  AST 15  ALT <5*  ALKPHOS 73  BILITOT 1.2   ------------------------------------------------------------------------------------------------------------------ estimated creatinine clearance is 23.1 mL/min (A) (by C-G formula based on SCr of 1.98 mg/dL (H)). ------------------------------------------------------------------------------------------------------------------ No results for input(s): TSH, T4TOTAL, T3FREE, THYROIDAB in the last 72 hours.  Invalid input(s): FREET3   Coagulation profile  Recent Labs Lab 02/10/17 1352 02/15/17 1259  INR 4.7 9.26*   ------------------------------------------------------------------------------------------------------------------- No results for input(s): DDIMER in the last 72 hours. -------------------------------------------------------------------------------------------------------------------  Cardiac Enzymes  Recent Labs Lab 02/15/17 1259  TROPONINI 0.06*   ------------------------------------------------------------------------------------------------------------------ Invalid input(s): POCBNP  ---------------------------------------------------------------------------------------------------------------  Urinalysis    Component Value Date/Time   COLORURINE YELLOW (A) 11/06/2016 1913   APPEARANCEUR Turbid (A) 12/08/2016 1440   LABSPEC 1.009 11/06/2016 1913   PHURINE 5.0 11/06/2016 1913   GLUCOSEU 2+ (A) 12/08/2016 1440   HGBUR SMALL (A) 11/06/2016 1913   BILIRUBINUR Negative  12/08/2016 1440   KETONESUR NEGATIVE 11/06/2016 1913   PROTEINUR 3+ (A) 12/08/2016 1440   PROTEINUR 100 (A) 11/06/2016 1913   NITRITE Positive (A) 12/08/2016 1440   NITRITE NEGATIVE 11/06/2016 1913   LEUKOCYTESUR 1+ (A) 12/08/2016 1440     RADIOLOGY: Dg Chest 2 View  Result Date: 02/15/2017 CLINICAL DATA:  76 year old female with shortness of breath and weakness today. Initial encounter. EXAM: CHEST  2 VIEW COMPARISON:  Chest radiographs 01/26/2017 FINDINGS: Small it try device projects along the anterior superficial left chest wall over the left heart border. Cardiomegaly with evidence of left atrial enlargement. Other mediastinal contours are within normal limits. Visualized tracheal air column is within normal limits. No pneumothorax or pulmonary edema. Evidence of small left greater than right pleural effusions. No confluent pulmonary opacity. No acute osseous abnormality identified. Negative visible bowel gas pattern. IMPRESSION: Cardiomegaly with small pleural effusions but no acute pulmonary edema. Electronically Signed   By: Genevie Ann M.D.   On: 02/15/2017 14:07    EKG: Orders placed or  performed during the hospital encounter of 02/15/17  . ED EKG within 10 minutes  . ED EKG within 10 minutes  . EKG 12-Lead  . EKG 12-Lead    IMPRESSION AND PLAN:  * Acute respiratory failure secondary to acute on chronic combined diastolic and systolic congestive heart failure   We'll give IV Lasix and follow clinically.   Recent echocardiogram done.  * Acquired coagulopathy secondary to warfarin   INR is high, no active bleeding, will continue holding warfarin and watch for INR to come down.   * COPD    There is no exacerbation symptoms so I will just give DuoNeb. no need for IV steroid.   * Hypertension    Continue home medications, hold hydralazine as blood pressure is running on normal side.   * Diabetes    I will give decreased dose of long-acting insulin as she is on very high-dose.    * Parkinson's disease   Continue home medication, has chronic shaking.  All the records are reviewed and case discussed with ED provider. Management plans discussed with the patient, family and they are in agreement.  CODE STATUS: full code.  Code Status History    Date Active Date Inactive Code Status Order ID Comments User Context   01/26/2017  2:44 PM 01/29/2017  9:47 PM Full Code [id number]  Loletha Grayer, MD ED   12/28/2016  7:44 AM 12/29/2016  4:34 PM DNR [id number]  Max Sane, MD Inpatient   12/27/2016 11:06 PM 12/28/2016  7:44 AM Full Code [id number]  Hugelmeyer, Ubaldo Glassing, DO Inpatient    Advance Directive Documentation     Most Recent Value  Type of Advance Directive  Healthcare Power of Attorney  Pre-existing out of facility DNR order (yellow form or pink MOST form)  -  "MOST" Form in Place?  -     Patient's daughter was present in the room during my visit.   TOTAL TIME TAKING CARE OF THIS PATIENT: 45  minutes.    Vaughan Basta M.D on 02/15/2017   Between 7am to 6pm - Pager - [phone]  After 6pm go to [email or web address] - password [email or web address] [facility] [place]  Office  [phone]  CC: Primary care physician; Suann Larry, MD   Note: This dictation was prepared with Dragon dictation along with smaller phrase technology. Any transcriptional errors that result from this process are unintentional.

## 2017-02-16 LAB — PROTIME-INR
INR: 9.53
Prothrombin Time: 80.1 seconds — ABNORMAL HIGH (ref 11.4–15.2)

## 2017-02-16 LAB — BASIC METABOLIC PANEL
Anion gap: 7 (ref 5–15)
BUN: 34 mg/dL — AB (ref 6–20)
CHLORIDE: 113 mmol/L — AB (ref 101–111)
CO2: 24 mmol/L (ref 22–32)
Calcium: 8.6 mg/dL — ABNORMAL LOW (ref 8.9–10.3)
Creatinine, Ser: 2.32 mg/dL — ABNORMAL HIGH (ref 0.44–1.00)
GFR calc Af Amer: 22 mL/min — ABNORMAL LOW (ref >60)
GFR calc non Af Amer: 19 mL/min — ABNORMAL LOW (ref >60)
GLUCOSE: 38 mg/dL — AB (ref 65–99)
POTASSIUM: 3.8 mmol/L (ref 3.5–5.1)
SODIUM: 144 mmol/L (ref 135–145)

## 2017-02-16 LAB — CBC
HEMATOCRIT: 31.5 % — AB (ref 35.0–47.0)
Hemoglobin: 10.5 g/dL — ABNORMAL LOW (ref 12.0–16.0)
MCH: 29.1 pg (ref 26.0–34.0)
MCHC: 33.4 g/dL (ref 32.0–36.0)
MCV: 87.2 fL (ref 80.0–100.0)
Platelets: 222 10*3/uL (ref 150–440)
RBC: 3.61 MIL/uL — ABNORMAL LOW (ref 3.80–5.20)
RDW: 14.9 % — AB (ref 11.5–14.5)
WBC: 5.5 10*3/uL (ref 3.6–11.0)

## 2017-02-16 LAB — GLUCOSE, CAPILLARY
Glucose-Capillary: 30 mg/dL — CL (ref 65–99)
Glucose-Capillary: 65 mg/dL (ref 65–99)
Glucose-Capillary: 78 mg/dL (ref 65–99)
Glucose-Capillary: 137 mg/dL — ABNORMAL HIGH (ref 65–99)
Glucose-Capillary: 176 mg/dL — ABNORMAL HIGH (ref 65–99)

## 2017-02-16 MED ORDER — HYDRALAZINE HCL 50 MG PO TABS
50.0000 mg | ORAL_TABLET | Freq: Two times a day (BID) | ORAL | Status: DC
  Administered 2017-02-16: 50 mg via ORAL
  Filled 2017-02-16: qty 1

## 2017-02-16 MED ORDER — METOPROLOL TARTRATE 25 MG PO TABS
12.5000 mg | ORAL_TABLET | Freq: Two times a day (BID) | ORAL | Status: DC
  Administered 2017-02-16: 12.5 mg via ORAL
  Filled 2017-02-16: qty 1

## 2017-02-16 MED ORDER — HYDRALAZINE HCL 50 MG PO TABS: 50.0000 mg | ORAL_TABLET | Freq: Two times a day (BID) | ORAL | 0 refills | Status: DC

## 2017-02-16 MED ORDER — LABETALOL HCL 5 MG/ML IV SOLN
10.0000 mg | Freq: Once | INTRAVENOUS | Status: AC
  Administered 2017-02-16: 10 mg via INTRAVENOUS
  Filled 2017-02-16: qty 4

## 2017-02-16 MED ORDER — ISOSORBIDE MONONITRATE ER 60 MG PO TB24: 60.0000 mg | ORAL_TABLET | Freq: Every day | ORAL | 0 refills | Status: DC

## 2017-02-16 MED ORDER — PHYTONADIONE 5 MG PO TABS
2.5000 mg | ORAL_TABLET | Freq: Once | ORAL | Status: AC
  Administered 2017-02-16: 2.5 mg via ORAL
  Filled 2017-02-16: qty 1

## 2017-02-16 MED ORDER — METOPROLOL TARTRATE 25 MG PO TABS: 12.5000 mg | ORAL_TABLET | Freq: Two times a day (BID) | ORAL | 0 refills | Status: DC

## 2017-02-16 NOTE — Progress Notes (Signed)
Inpatient Diabetes Program Recommendations  AACE/ADA: New Consensus Statement on Inpatient Glycemic Control (2015)  Target Ranges:  Prepandial:   less than 140 mg/dL      Peak postprandial:   less than 180 mg/dL (1-2 hours)      Critically ill patients:  140 - 180 mg/dL  Results for Molly Wolf, Molly Wolf (MRN [id number]) as of 02/16/2017 12:30  Ref. Range 02/16/2017 05:02 02/16/2017 05:19 02/16/2017 05:37 02/16/2017 07:45 02/16/2017 11:37  Glucose-Capillary Latest Ref Range: 65 - 99 mg/dL 30 (LL) 65 78 [id number] (H) 324176 (H)    Review of Glycemic Control  Diabetes history: DM2 Outpatient Diabetes medications: Lantus 40 units QHS, Humalog 6 units TID with meals Current orders for Inpatient glycemic control: Lantus 20 units QHS, Novolog 0-9 units TID with meals for meal coverage  Inpatient Diabetes Program Recommendations: Insulin - Basal: Fasting glucose 30 mg/dl at 4:015:02 am today. Note patient is to be discharged today. If patient is not discharged home today, please decrease Lantus to 7 units QHS (based on 68 kg x 0.1 units). A1C: Please consider adding an A1C to blood in lab to evaluate glycemic control over the past 2-3 months. Outpatient Follow Up: Recommend patient follow up with PCP regarding DM control.  Thanks, Orlando PennerMarie Jehad Bisono, RN, MSN, CDE Diabetes Coordinator Inpatient Diabetes Program [phone] (Team Pager from 8am to 5pm)

## 2017-02-16 NOTE — Care Management Note (Signed)
Case Management Note  Patient Details  Name: Renella CunasJudy Rogacki MRN: [id number] Date of Birth: 1941/04/10  Subjective/Objective:                 Placed in observation for shortness of breath.  Also found to have an INR of 9.5.  Previous discharge 4.20.2018 with DVT and started on coumadin.  Lives with grandson and daughter.  [facility] providing SN PT Aide    Action/Plan: Notified [facility] of admission and inquired about medication administration assessment, and INR results.  Expected Discharge Date:                  Expected Discharge Plan:     In-House Referral:     Discharge planning Services     Post Acute Care Choice:    Choice offered to:     DME Arranged:    DME Agency:     HH Arranged:    HH Agency:     Status of Service:     If discussed at [employer] Meetings, dates discussed:    Additional Comments:  Eber HongGreene, Tovah Slavick R, RN 02/16/2017, 9:17 AM

## 2017-02-16 NOTE — Progress Notes (Signed)
Notified dr.diamond of pt critical labs of 80.1 PT and 9.5 INR. Acknowledged and no new orders.

## 2017-02-16 NOTE — Care Management Obs Status (Signed)
MEDICARE OBSERVATION STATUS NOTIFICATION   Patient Details  Name: Molly CunasJudy Lamar MRN: [id number] Date of Birth: 29-Mar-1941   Medicare Observation Status Notification Given:  No  < 24 hours  Eber HongGreene, Aston Lawhorn R, RN 02/16/2017, 10:21 AM

## 2017-02-16 NOTE — Discharge Instructions (Signed)

## 2017-02-16 NOTE — Care Management (Signed)
Notified [facility] of discharge, orders present for SN PT Aide. INR for 5.9 and not to be taking coumadin until INR stabilizes

## 2017-02-16 NOTE — Discharge Summary (Signed)
[facility] NAME: Molly Wolf    MR#:  [id number]  [place] OF BIRTH:  [birth year]  DATE OF ADMISSION:  02/15/2017 ADMITTING PHYSICIAN: Vaughan Basta, MD  DATE OF DISCHARGE: 02/16/2017  PRIMARY CARE PHYSICIAN: Bergert, Luther Redo, MD    ADMISSION DIAGNOSIS:  Elevated INR [R79.1] Elevated troponin I level [R74.8] Dyspnea, unspecified type [R06.00]  DISCHARGE DIAGNOSIS:  Principal Problem:   Acute respiratory failure ([place])   SECONDARY DIAGNOSIS:   Past Medical History:  Diagnosis Date  . Anxiety   . Arthritis   . Chronic combined systolic and diastolic CHF (congestive heart failure) ([place])    a. 01/2008 Echo ([facility]): EF > 55%, mod-sev LVH w/ grade 1 DD, biatrial enlargement, mild AS, trace MR/TR; b. EF 45-50%, GR2DD, mild to moderate aortic stenosis, mild aortic insufficieny, mild to moderate mitral regurgitation, mild tricuspid regurgitation, mild biatrial enlargement, and a small pericardial effusion  . CKD (chronic kidney disease), stage IV ([place])   . Closed patellar sleeve fracture of right knee    a. 01/2017 - conservatively managed.  Marland Kitchen COPD (chronic obstructive pulmonary disease) ([place])   . Coronary artery disease    a. s/p remote stenting of unknown vessel @ [facility].  . Depression   . Diabetes mellitus with complication ([place])   . DVT (deep venous thrombosis) ([facility])    a. in the setting of right patellar fracture on Coumadin  . Hyperlipidemia   . Hypertension   . Parkinson disease [facility])     HOSPITAL COURSE:  76 y/o female with combined CHF who presents with SOB.  1. Acute hypoxic respiratory failure due to CHF exacerbation Patient symptoms improved. She is no longer hypoxic.  2. Combined systolic and diastolic heart failure: Patient has been diuresed with IV Lasix. Patient may resume home Lasix dose. Patient will follow-up with CHF clinic.  3. Accelerated hypertension: Patient has been restarted on her outpatient  medications and blood pressures improved.  4.  supratherapeutic INR: Patient received 1 low dose of vitamin K. She will need follow-up INR in a.m.  5. COPD without exacerbation:  6. Diabetes: Patient may resume outpatient medications with ADA diet 7. Parkinson's disease: Continue Sinemet and myrbetriq  DISCHARGE CONDITIONS AND DIET:   Stable Cardiac diabetic diet  CONSULTS OBTAINED:    DRUG ALLERGIES:  No Known Allergies  DISCHARGE MEDICATIONS:   Current Discharge Medication List    CONTINUE these medications which have CHANGED   Details  hydrALAZINE (APRESOLINE) 50 MG tablet Take 1 tablet (50 mg total) by mouth 2 (two) times daily. Qty: 60 tablet, Refills: 0    isosorbide mononitrate (IMDUR) 60 MG 24 hr tablet Take 1 tablet (60 mg total) by mouth daily. Qty: 30 tablet, Refills: 0    metoprolol tartrate (LOPRESSOR) 25 MG tablet Take 0.5 tablets (12.5 mg total) by mouth 2 (two) times daily. Qty: 60 tablet, Refills: 0      CONTINUE these medications which have NOT CHANGED   Details  acetaminophen (TYLENOL) 325 MG tablet Take 650 mg by mouth every 6 (six) hours as needed for mild pain.    atorvastatin (LIPITOR) 20 MG tablet Take 20 mg by mouth daily.    Carbidopa-Levodopa ER (SINEMET CR) 25-100 MG tablet controlled release Take 2 tablets by mouth 4 (four) times daily.    Coenzyme Q10 (COQ10) 50 MG CAPS Take 50 mg by mouth daily.    CRANBERRY PO Take 1 tablet by mouth daily.    escitalopram (  LEXAPRO) 20 MG tablet Take 20 mg by mouth daily.    furosemide (LASIX) 20 MG tablet Take 20 mg by mouth daily.     gabapentin (NEURONTIN) 300 MG capsule Take 300 mg by mouth daily.     insulin glargine (LANTUS) 100 UNIT/ML injection Inject 40 Units into the skin at bedtime.     insulin lispro (HUMALOG) 100 UNIT/ML injection Inject 6 Units into the skin 3 (three) times daily with meals.     loratadine (CLARITIN) 10 MG tablet Take 5 mg by mouth daily as needed for allergies.      LORazepam (ATIVAN) 1 MG tablet Take 1 tablet (1 mg total) by mouth 2 (two) times daily as needed for anxiety. Qty: 5 tablet, Refills: 0    mirabegron ER (MYRBETRIQ) 25 MG TB24 tablet Take 25 mg by mouth daily.    nitroGLYCERIN (NITROSTAT) 0.4 MG SL tablet Place 0.4 mg under the tongue every 5 (five) minutes as needed for chest pain.    Omega-3 Fatty Acids (FISH OIL) 1000 MG CAPS Take 1,000 mg by mouth daily.    polyethylene glycol (MIRALAX / GLYCOLAX) packet Take 17 g by mouth daily as needed for mild constipation.    promethazine (PHENERGAN) 25 MG tablet Take 25 mg by mouth every 6 (six) hours as needed for nausea or vomiting.    saccharomyces boulardii (FLORASTOR) 250 MG capsule Take 250 mg by mouth daily.    traMADol (ULTRAM) 50 MG tablet Take 1 tablet (50 mg total) by mouth daily as needed. Qty: 5 tablet, Refills: 0    blood glucose meter kit and supplies KIT Dispense based on patient and insurance preference. Use up to four times daily as directed. (FOR ICD-9 250.00, 250.01). Qty: 1 each, Refills: 0      STOP taking these medications     warfarin (COUMADIN) 5 MG tablet           Today   CHIEF COMPLAINT:  Doing well SOB improved   VITAL SIGNS:  Blood pressure (!) 124/57, pulse 72, temperature 97.6 F (36.4 C), resp. rate 20, height '5\' 7"'$  (1.702 m), weight 68.8 kg (151 lb 9.6 oz), SpO2 98 %.   REVIEW OF SYSTEMS:  Review of Systems  Constitutional: Negative.  Negative for chills, fever and malaise/fatigue.  HENT: Negative.  Negative for ear discharge, ear pain, hearing loss, nosebleeds and sore throat.   Eyes: Negative.  Negative for blurred vision and pain.  Respiratory: Negative.  Negative for cough, hemoptysis, shortness of breath and wheezing.   Cardiovascular: Negative.  Negative for chest pain, palpitations and leg swelling.  Gastrointestinal: Negative.  Negative for abdominal pain, blood in stool, diarrhea, nausea and vomiting.  Genitourinary:  Negative.  Negative for dysuria.  Musculoskeletal: Negative.  Negative for back pain.  Skin: Negative.   Neurological: Negative for dizziness, tremors, speech change, focal weakness, seizures and headaches.  Endo/Heme/Allergies: Negative.  Does not bruise/bleed easily.  Psychiatric/Behavioral: Negative.  Negative for depression, hallucinations and suicidal ideas.     PHYSICAL EXAMINATION:  GENERAL:  76 y.o.-year-old patient lying in the bed with no acute distress.  NECK:  Supple, no jugular venous distention. No thyroid enlargement, no tenderness.  LUNGS: Normal breath sounds bilaterally, no wheezing, rales,rhonchi  No use of accessory muscles of respiration.  CARDIOVASCULAR: S1, S2 normal. No murmurs, rubs, or gallops.  ABDOMEN: Soft, non-tender, non-distended. Bowel sounds present. No organomegaly or mass.  EXTREMITIES: No pedal edema, cyanosis, or clubbing.  PSYCHIATRIC: The patient is alert and  oriented x 3.  SKIN: No obvious rash, lesion, or ulcer.  She has baseline tremors DATA REVIEW:   CBC  Recent Labs Lab 02/16/17 0428  WBC 5.5  HGB 10.5*  HCT 31.5*  PLT 222    Chemistries   Recent Labs Lab 02/15/17 1259 02/16/17 0428  NA 142 144  K 4.0 3.8  CL 111 113*  CO2 23 24  GLUCOSE 79 38*  BUN 27* 34*  CREATININE 1.98* 2.32*  CALCIUM 9.1 8.6*  AST 15  --   ALT <5*  --   ALKPHOS 73  --   BILITOT 1.2  --     Cardiac Enzymes  Recent Labs Lab 02/15/17 1259  TROPONINI 0.06*    Microbiology Results  _0 @  RADIOLOGY:  Dg Chest 2 View  Result Date: 02/15/2017 CLINICAL DATA:  76 year old female with shortness of breath and weakness today. Initial encounter. EXAM: CHEST  2 VIEW COMPARISON:  Chest radiographs 01/26/2017 FINDINGS: Small it try device projects along the anterior superficial left chest wall over the left heart border. Cardiomegaly with evidence of left atrial enlargement. Other mediastinal contours are within normal limits. Visualized  tracheal air column is within normal limits. No pneumothorax or pulmonary edema. Evidence of small left greater than right pleural effusions. No confluent pulmonary opacity. No acute osseous abnormality identified. Negative visible bowel gas pattern. IMPRESSION: Cardiomegaly with small pleural effusions but no acute pulmonary edema. Electronically Signed   By: Genevie Ann M.D.   On: 02/15/2017 14:07      Current Discharge Medication List    CONTINUE these medications which have CHANGED   Details  hydrALAZINE (APRESOLINE) 50 MG tablet Take 1 tablet (50 mg total) by mouth 2 (two) times daily. Qty: 60 tablet, Refills: 0    isosorbide mononitrate (IMDUR) 60 MG 24 hr tablet Take 1 tablet (60 mg total) by mouth daily. Qty: 30 tablet, Refills: 0    metoprolol tartrate (LOPRESSOR) 25 MG tablet Take 0.5 tablets (12.5 mg total) by mouth 2 (two) times daily. Qty: 60 tablet, Refills: 0      CONTINUE these medications which have NOT CHANGED   Details  acetaminophen (TYLENOL) 325 MG tablet Take 650 mg by mouth every 6 (six) hours as needed for mild pain.    atorvastatin (LIPITOR) 20 MG tablet Take 20 mg by mouth daily.    Carbidopa-Levodopa ER (SINEMET CR) 25-100 MG tablet controlled release Take 2 tablets by mouth 4 (four) times daily.    Coenzyme Q10 (COQ10) 50 MG CAPS Take 50 mg by mouth daily.    CRANBERRY PO Take 1 tablet by mouth daily.    escitalopram (LEXAPRO) 20 MG tablet Take 20 mg by mouth daily.    furosemide (LASIX) 20 MG tablet Take 20 mg by mouth daily.     gabapentin (NEURONTIN) 300 MG capsule Take 300 mg by mouth daily.     insulin glargine (LANTUS) 100 UNIT/ML injection Inject 40 Units into the skin at bedtime.     insulin lispro (HUMALOG) 100 UNIT/ML injection Inject 6 Units into the skin 3 (three) times daily with meals.     loratadine (CLARITIN) 10 MG tablet Take 5 mg by mouth daily as needed for allergies.     LORazepam (ATIVAN) 1 MG tablet Take 1 tablet (1 mg total) by  mouth 2 (two) times daily as needed for anxiety. Qty: 5 tablet, Refills: 0    mirabegron ER (MYRBETRIQ) 25 MG TB24 tablet Take 25 mg by mouth daily.  nitroGLYCERIN (NITROSTAT) 0.4 MG SL tablet Place 0.4 mg under the tongue every 5 (five) minutes as needed for chest pain.    Omega-3 Fatty Acids (FISH OIL) 1000 MG CAPS Take 1,000 mg by mouth daily.    polyethylene glycol (MIRALAX / GLYCOLAX) packet Take 17 g by mouth daily as needed for mild constipation.    promethazine (PHENERGAN) 25 MG tablet Take 25 mg by mouth every 6 (six) hours as needed for nausea or vomiting.    saccharomyces boulardii (FLORASTOR) 250 MG capsule Take 250 mg by mouth daily.    traMADol (ULTRAM) 50 MG tablet Take 1 tablet (50 mg total) by mouth daily as needed. Qty: 5 tablet, Refills: 0    blood glucose meter kit and supplies KIT Dispense based on patient and insurance preference. Use up to four times daily as directed. (FOR ICD-9 250.00, 250.01). Qty: 1 each, Refills: 0      STOP taking these medications     warfarin (COUMADIN) 5 MG tablet            Management plans discussed with the patient and she is in agreement. Stable for discharge home with [facility]  Patient should follow up with pcp  CODE STATUS:     Code Status Orders        Start     Ordered   02/15/17 1816  Full code  Continuous     02/15/17 1816    Code Status History    Date Active Date Inactive Code Status Order ID Comments User Context   01/26/2017  2:44 PM 01/29/2017  9:47 PM Full Code [id number]  Loletha Grayer, MD ED   12/28/2016  7:44 AM 12/29/2016  4:34 PM DNR [id number]  Max Sane, MD Inpatient   12/27/2016 11:06 PM 12/28/2016  7:44 AM Full Code [id number]  Hugelmeyer, Ubaldo Glassing, DO Inpatient    Advance Directive Documentation     Most Recent Value  Type of Advance Directive  Healthcare Power of Attorney  Pre-existing out of facility DNR order (yellow form or pink MOST form)  -  "MOST" Form in Place?  -      TOTAL TIME  TAKING CARE OF THIS PATIENT: 37 minutes.    Note: This dictation was prepared with Dragon dictation along with smaller phrase technology. Any transcriptional errors that result from this process are unintentional.  Maddeline Roorda M.D on 02/16/2017 at 10:13 AM  Between 7am to 6pm - Pager - [phone] After 6pm go to [email or web address] - password [email or web address] [place] [facility]  Office  [phone]  CC: Primary care physician; Suann Larry, MD

## 2017-02-16 NOTE — Progress Notes (Addendum)
Notified dr.diamond of lab calling with a critical glucose of 38. Pt awakened and A&O able to drink 2 orange juices and this rn following low blood sugar protocol. Acknowledged and no new orders. Cont to follow protocol. Call if needed.   Pt fs now 77

## 2017-02-16 NOTE — Progress Notes (Signed)
Pt d/c to home today.  IV removed intact.  Rx's given to pt w/all questions and concerns addressed.  D/C paperwork reviewed and education provided with all questions and concerns addressed.  Pt daughter [name] at bedside for home transport.

## 2017-02-17 ENCOUNTER — Ambulatory Visit (INDEPENDENT_AMBULATORY_CARE_PROVIDER_SITE_OTHER): Payer: Medicare Other

## 2017-02-17 ENCOUNTER — Telehealth: Payer: Self-pay | Admitting: Internal Medicine

## 2017-02-17 DIAGNOSIS — I824Y1 Acute embolism and thrombosis of unspecified deep veins of right proximal lower extremity: Secondary | ICD-10-CM

## 2017-02-17 LAB — POCT INR: INR: 1.9

## 2017-02-17 MED ORDER — WARFARIN SODIUM 1 MG PO TABS: ORAL_TABLET | ORAL | 0 refills | Status: DC

## 2017-02-17 NOTE — Telephone Encounter (Signed)
Molly Wolf from [facility] called about recheck INR and dose - dose confirmed. They are unable to go to pt home on Monday, but will check on Tuesday 02/23/17 and call results to clinic.

## 2017-02-17 NOTE — Telephone Encounter (Signed)
Nurse with Home Health checked pt INR via finger stick , it was 1.9. She asks also how often does she need this checked, and asks if she needs her appt this afternoon. Please call.

## 2017-02-17 NOTE — Telephone Encounter (Signed)
Called [facility] pt switched to 1mg  tablets, instructed pt's daughter [name] to have pt start taking 1mg  daily.  Gave verbal order on VM to recheck INR on Monday 02/22/17 and call results to [facility]madin [facility] [phone].  See anticoagulation note in EPIC. [facility] TCB to verify orders received.

## 2017-02-19 ENCOUNTER — Telehealth: Payer: Self-pay

## 2017-02-19 NOTE — Telephone Encounter (Signed)
Appointment made for 02/25/2017. Thank you for the referral.

## 2017-02-19 NOTE — Telephone Encounter (Signed)
Attempted to contact patient in regards to referral received. I was unable to reach the patient and left a message.

## 2017-02-19 NOTE — Telephone Encounter (Signed)
-----   Message from Delma Freezeina A Hackney, [place] sent at 02/17/2017 10:51 AM EDT ----- Regarding: Please call patient to schedule appt Contact: [phone] Referred by Adrian SaranSital Mody

## 2017-02-25 ENCOUNTER — Telehealth: Payer: Self-pay | Admitting: Internal Medicine

## 2017-02-25 ENCOUNTER — Ambulatory Visit: Payer: Medicare Other | Attending: Family | Admitting: Family

## 2017-02-25 ENCOUNTER — Ambulatory Visit (INDEPENDENT_AMBULATORY_CARE_PROVIDER_SITE_OTHER): Payer: Medicare Other | Admitting: Pharmacist

## 2017-02-25 ENCOUNTER — Encounter: Payer: Self-pay | Admitting: Family

## 2017-02-25 DIAGNOSIS — Z794 Long term (current) use of insulin: Secondary | ICD-10-CM | POA: Insufficient documentation

## 2017-02-25 DIAGNOSIS — I130 Hypertensive heart and chronic kidney disease with heart failure and stage 1 through stage 4 chronic kidney disease, or unspecified chronic kidney disease: Secondary | ICD-10-CM | POA: Diagnosis not present

## 2017-02-25 DIAGNOSIS — Z955 Presence of coronary angioplasty implant and graft: Secondary | ICD-10-CM | POA: Insufficient documentation

## 2017-02-25 DIAGNOSIS — F329 Major depressive disorder, single episode, unspecified: Secondary | ICD-10-CM | POA: Insufficient documentation

## 2017-02-25 DIAGNOSIS — E785 Hyperlipidemia, unspecified: Secondary | ICD-10-CM | POA: Insufficient documentation

## 2017-02-25 DIAGNOSIS — Z9889 Other specified postprocedural states: Secondary | ICD-10-CM | POA: Diagnosis not present

## 2017-02-25 DIAGNOSIS — G20 Parkinson's disease: Secondary | ICD-10-CM | POA: Insufficient documentation

## 2017-02-25 DIAGNOSIS — Z8049 Family history of malignant neoplasm of other genital organs: Secondary | ICD-10-CM | POA: Insufficient documentation

## 2017-02-25 DIAGNOSIS — R791 Abnormal coagulation profile: Secondary | ICD-10-CM | POA: Insufficient documentation

## 2017-02-25 DIAGNOSIS — Z8249 Family history of ischemic heart disease and other diseases of the circulatory system: Secondary | ICD-10-CM | POA: Diagnosis not present

## 2017-02-25 DIAGNOSIS — I5042 Chronic combined systolic (congestive) and diastolic (congestive) heart failure: Secondary | ICD-10-CM | POA: Diagnosis present

## 2017-02-25 DIAGNOSIS — Z86718 Personal history of other venous thrombosis and embolism: Secondary | ICD-10-CM | POA: Insufficient documentation

## 2017-02-25 DIAGNOSIS — F419 Anxiety disorder, unspecified: Secondary | ICD-10-CM | POA: Insufficient documentation

## 2017-02-25 DIAGNOSIS — I10 Essential (primary) hypertension: Secondary | ICD-10-CM

## 2017-02-25 DIAGNOSIS — M1990 Unspecified osteoarthritis, unspecified site: Secondary | ICD-10-CM | POA: Insufficient documentation

## 2017-02-25 DIAGNOSIS — N184 Chronic kidney disease, stage 4 (severe): Secondary | ICD-10-CM | POA: Diagnosis not present

## 2017-02-25 DIAGNOSIS — J449 Chronic obstructive pulmonary disease, unspecified: Secondary | ICD-10-CM | POA: Insufficient documentation

## 2017-02-25 DIAGNOSIS — Z7901 Long term (current) use of anticoagulants: Secondary | ICD-10-CM | POA: Insufficient documentation

## 2017-02-25 DIAGNOSIS — I2510 Atherosclerotic heart disease of native coronary artery without angina pectoris: Secondary | ICD-10-CM | POA: Insufficient documentation

## 2017-02-25 DIAGNOSIS — Z841 Family history of disorders of kidney and ureter: Secondary | ICD-10-CM | POA: Insufficient documentation

## 2017-02-25 DIAGNOSIS — I5032 Chronic diastolic (congestive) heart failure: Secondary | ICD-10-CM

## 2017-02-25 DIAGNOSIS — I824Y1 Acute embolism and thrombosis of unspecified deep veins of right proximal lower extremity: Secondary | ICD-10-CM

## 2017-02-25 DIAGNOSIS — E1122 Type 2 diabetes mellitus with diabetic chronic kidney disease: Secondary | ICD-10-CM | POA: Diagnosis not present

## 2017-02-25 LAB — POCT INR: INR: 1.2

## 2017-02-25 MED ORDER — GLUCOSE BLOOD VI STRP: ORAL_STRIP | 12 refills | Status: DC

## 2017-02-25 NOTE — Patient Instructions (Signed)
Begin weighing daily and call for an overnight weight gain of > 2 pounds or a weekly weight gain of >5 pounds. 

## 2017-02-25 NOTE — Progress Notes (Signed)
Patient ID: Molly Wolf, female    DOB: 1941-05-01, 76 y.o.   MRN: [id number]  HPI  Ms Molly Wolf is a 76 y/o female with a history of anxiety, arthritis, CKD, COPD, CAD, depression, DM, DVT, hyperlipidemia, HTN, parkinson's and chronic heart failure.   Reviewed last echo report done 01/26/17 which showed an EF of 45-50% along with mild/mod AS, mild AR, mild/mod TR along with moderately/severely elevated PA pressure of >50 mm Hg.  Admitted 02/15/17 due to HF exacerbation. Initially needed IV diuretics and transitioned back to oral diuretics. Discharged the following day. Was in the ED on 02/08/17 due to elevated INR level. Evaluated and discharged the same day. Admitted 01/26/17 due to HF exacerbation. Treated with IV diuretics and did not discharge with oral diuretics. Lost ~ 5L of fluids during hospitalization. Acute DVT treated initially with heparin drip and then transitioned onto warfarin. Cardiology consult obtained. Discharged home with [facility] and physical therapy.   She presents today for her initial visit with a chief complaint of mild shortness of breath with moderate exertion. She describes it as chronic in nature and has been present in varying degrees for several years. Shortness of breath improves with rest. She has associated fatigue, chest tightness, dizziness and weakness along with this.   Past Medical History:  Diagnosis Date  . Anxiety   . Arthritis   . Chronic combined systolic and diastolic CHF (congestive heart failure) ([place])    a. 01/2008 Echo ([facility]): EF > 55%, mod-sev LVH w/ grade 1 DD, biatrial enlargement, mild AS, trace MR/TR; b. EF 45-50%, GR2DD, mild to moderate aortic stenosis, mild aortic insufficieny, mild to moderate mitral regurgitation, mild tricuspid regurgitation, mild biatrial enlargement, and a small pericardial effusion  . CKD (chronic kidney disease), stage IV ([place])   . Closed patellar sleeve fracture of right knee    a. 01/2017 - conservatively managed.  Marland Kitchen COPD  (chronic obstructive pulmonary disease) ([place])   . Coronary artery disease    a. s/p remote stenting of unknown vessel @ [facility].  . Depression   . Diabetes mellitus with complication ([place])   . DVT (deep venous thrombosis) ([facility])    a. in the setting of right patellar fracture on Coumadin  . Hyperlipidemia   . Hypertension   . Parkinson disease [facility])    Past Surgical History:  Procedure Laterality Date  . BACK SURGERY    . CORONARY ANGIOPLASTY WITH STENT PLACEMENT    . NECK SURGERY     Family History  Problem Relation Age of Onset  . Cervical cancer Mother   . Kidney failure Father   . CAD Father   . CAD Brother   . Prostate cancer Neg Hx   . Kidney cancer Neg Hx   . Bladder Cancer Neg Hx    Social History  Substance Use Topics  . Smoking status: Never Smoker  . Smokeless tobacco: Never Used  . Alcohol use No   No Known Allergies Prior to Admission medications   Medication Sig Start Date End Date Taking? Authorizing Provider  acetaminophen (TYLENOL) 325 MG tablet Take 650 mg by mouth every 6 (six) hours as needed for mild pain.   Yes [provider]  atorvastatin (LIPITOR) 20 MG tablet Take 20 mg by mouth daily.   Yes [provider]  blood glucose meter kit and supplies KIT Dispense based on patient and insurance preference. Use up to four times daily as directed. (FOR ICD-9 250.00, 250.01). 11/06/16  Yes [place], [place],  MD  Carbidopa-Levodopa ER (SINEMET CR) 25-100 MG tablet controlled release Take 2 tablets by mouth 4 (four) times daily.   Yes [provider]  Coenzyme Q10 (COQ10) 50 MG CAPS Take 50 mg by mouth daily.   Yes [provider]  CRANBERRY PO Take 1 tablet by mouth daily.   Yes [provider]  escitalopram (LEXAPRO) 20 MG tablet Take 20 mg by mouth daily.   Yes [provider]  furosemide (LASIX) 20 MG tablet Take 20 mg by mouth daily.    Yes [provider]  gabapentin (NEURONTIN) 300 MG capsule  Take 300 mg by mouth daily.    Yes [provider]  hydrALAZINE (APRESOLINE) 50 MG tablet Take 1 tablet (50 mg total) by mouth 2 (two) times daily. 02/16/17  Yes Mody, Ulice Bold, MD  insulin glargine (LANTUS) 100 UNIT/ML injection Inject 40 Units into the skin at bedtime.    Yes [provider]  insulin lispro (HUMALOG) 100 UNIT/ML injection Inject 6 Units into the skin 3 (three) times daily with meals.    Yes [provider]  isosorbide mononitrate (IMDUR) 60 MG 24 hr tablet Take 1 tablet (60 mg total) by mouth daily. 02/16/17  Yes Mody, Ulice Bold, MD  loratadine (CLARITIN) 10 MG tablet Take 5 mg by mouth daily as needed for allergies.    Yes [provider]  LORazepam (ATIVAN) 1 MG tablet Take 1 tablet (1 mg total) by mouth 2 (two) times daily as needed for anxiety. 12/29/16  Yes Max Sane, MD  metoprolol tartrate (LOPRESSOR) 25 MG tablet Take 0.5 tablets (12.5 mg total) by mouth 2 (two) times daily. 02/16/17  Yes Mody, Ulice Bold, MD  mirabegron ER (MYRBETRIQ) 25 MG TB24 tablet Take 25 mg by mouth daily.   Yes [provider]  nitroGLYCERIN (NITROSTAT) 0.4 MG SL tablet Place 0.4 mg under the tongue every 5 (five) minutes as needed for chest pain.   Yes [provider]  Omega-3 Fatty Acids (FISH OIL) 1000 MG CAPS Take 1,000 mg by mouth daily.   Yes [provider]  polyethylene glycol (MIRALAX / GLYCOLAX) packet Take 17 g by mouth daily as needed for mild constipation.   Yes [provider]  promethazine (PHENERGAN) 25 MG tablet Take 25 mg by mouth every 6 (six) hours as needed for nausea or vomiting.   Yes [provider]  saccharomyces boulardii (FLORASTOR) 250 MG capsule Take 250 mg by mouth daily.   Yes [provider]  traMADol (ULTRAM) 50 MG tablet Take 1 tablet (50 mg total) by mouth daily as needed. 12/29/16  Yes Max Sane, MD  warfarin (COUMADIN) 1 MG tablet Take as directed by Coumadin Clinic 02/17/17  Yes End,  Harrell Gave, MD  glucose blood (ACCU-CHEK AVIVA PLUS) test strip Use as instructed 02/25/17   Alisa Graff, FNP    Review of Systems  Constitutional: Positive for appetite change (decreased) and fatigue.  HENT: Positive for rhinorrhea and trouble swallowing. Negative for congestion.   Eyes: Negative.   Respiratory: Positive for chest tightness and shortness of breath. Negative for cough.   Cardiovascular: Negative for chest pain, palpitations and leg swelling.  Gastrointestinal: Negative for abdominal distention and abdominal pain.  Endocrine: Negative.   Genitourinary: Negative.   Musculoskeletal: Negative for back pain and neck pain.  Skin: Negative.   Allergic/Immunologic: Negative.   Neurological: Positive for dizziness, tremors (right arm/head) and weakness ("all over"). Negative for light-headedness.  Hematological: Negative for adenopathy. Bruises/bleeds easily.  Psychiatric/Behavioral: Negative for dysphoric mood and sleep disturbance (sleeping on 2-3 pillows). The patient is not nervous/anxious.    Vitals:   02/25/17 1044  BP: (!) 143/69  Pulse: 71  Resp: 18  SpO2: 98%  Weight: 162 lb (73.5 kg)  Height: 5' 7"  (1.702 m)   Wt Readings from Last 3 Encounters:  02/25/17 162 lb (73.5 kg)  02/15/17 151 lb 9.6 oz (68.8 kg)  02/08/17 161 lb (73 kg)   Lab Results  Component Value Date   CREATININE 2.32 (H) 02/16/2017   CREATININE 1.98 (H) 02/15/2017   CREATININE 2.24 (H) 02/08/2017   Physical Exam  Constitutional: She is oriented to person, place, and time. She appears well-developed and well-nourished.  HENT:  Head: Normocephalic and atraumatic.  Neck: Normal range of motion. No JVD present.  Cardiovascular: Normal rate and regular rhythm.   Pulmonary/Chest: Effort normal. She has no wheezes. She has no rales.  Abdominal: Soft. She exhibits no distension. There is no tenderness.  Musculoskeletal: She exhibits no edema or tenderness.  Neurological: She is alert and  oriented to person, place, and time. She displays tremor (shaking of right arm and head).  Skin: Skin is warm and dry.  Psychiatric: She has a normal mood and affect. Thought content normal.  Nursing note and vitals reviewed.   Assessment & Plan:  1: Chronic heart failure with preserved ejection fraction- - NYHA class II - euvolemic - not weighing daily as she doesn't have any scales so a set was provided to her. Instructed to weigh every morning after using the bathroom, write the weight down and call for an overnight weight gain of >2 pounds or a weekly weight gain of >5 pounds - not adding salt and her daughter is trying to read food labels. Discussed a 2071m sodium diet and written dietary information was given to her about this - currently wearing a holter monitor - receiving home health PT via [facility] - PharmD went in and reviewed medications with patient and her daughter - sees cardiologist (End) 03/31/17  2: HTN- - BP looks good today - continue medications - planning to get established with new PCP ([place] this summer  3: Parkinson- - significant shaking of right arm and head  Medication bottles were reviewed.  Return here in 1 month or sooner for any questions/problems before then.

## 2017-02-25 NOTE — Telephone Encounter (Signed)
Patient daughter says she is not sure h/h is checking coumadin   Patient was changed to 1 mg coumadin recently as inr too high .  Please call to discuss.

## 2017-02-25 NOTE — Telephone Encounter (Signed)
Spoke to Fruit Hill[place]na, pt's daughter and [facility] but did not draw INR as directed. Advised I will call [facility] to follow up as INR was due on Monday.   LM with Lawanna KobusAngel, [facility] to call back.

## 2017-02-25 NOTE — Telephone Encounter (Signed)
See anticoag encounter from 02/25/17 for details.

## 2017-02-25 NOTE — Telephone Encounter (Signed)
Spoke to ManleyAngela with [facility] and she states that she is not nurse seeing pt this week. An order was entered, but they have not received orders from PCP and will likely be discharging patient based on this. Advised that we gave order for check on Monday and it is now Thursday and need INR given pt was 9.5 just a week ago.   She advised I call [facility] office as someone should be going to pt home today.   Spoke with Crystal at [facility] who left message for nurse who was on other line to call back to discuss

## 2017-02-26 DIAGNOSIS — I10 Essential (primary) hypertension: Secondary | ICD-10-CM | POA: Insufficient documentation

## 2017-02-26 DIAGNOSIS — G20 Parkinson's disease: Secondary | ICD-10-CM | POA: Insufficient documentation

## 2017-02-26 DIAGNOSIS — I5042 Chronic combined systolic (congestive) and diastolic (congestive) heart failure: Secondary | ICD-10-CM | POA: Insufficient documentation

## 2017-03-02 ENCOUNTER — Ambulatory Visit (INDEPENDENT_AMBULATORY_CARE_PROVIDER_SITE_OTHER): Payer: Medicare Other

## 2017-03-02 LAB — POCT INR: INR: 1.5

## 2017-03-05 ENCOUNTER — Other Ambulatory Visit: Payer: Self-pay | Admitting: Family

## 2017-03-05 MED ORDER — ISOSORBIDE MONONITRATE ER 60 MG PO TB24: 60.0000 mg | ORAL_TABLET | Freq: Every day | ORAL | 2 refills | Status: DC

## 2017-03-05 MED ORDER — FUROSEMIDE 20 MG PO TABS: 20.0000 mg | ORAL_TABLET | Freq: Every day | ORAL | 2 refills | Status: DC

## 2017-03-05 MED ORDER — CARBIDOPA-LEVODOPA ER 25-100 MG PO TBCR: 2.0000 | EXTENDED_RELEASE_TABLET | Freq: Four times a day (QID) | ORAL | 1 refills | Status: AC

## 2017-03-09 ENCOUNTER — Ambulatory Visit (INDEPENDENT_AMBULATORY_CARE_PROVIDER_SITE_OTHER): Payer: Medicare Other | Admitting: Interventional Cardiology

## 2017-03-09 LAB — POCT INR: INR: 1.3

## 2017-03-16 ENCOUNTER — Ambulatory Visit (INDEPENDENT_AMBULATORY_CARE_PROVIDER_SITE_OTHER): Payer: Self-pay | Admitting: Cardiology

## 2017-03-16 ENCOUNTER — Telehealth: Payer: Self-pay | Admitting: Internal Medicine

## 2017-03-16 DIAGNOSIS — R002 Palpitations: Secondary | ICD-10-CM

## 2017-03-16 LAB — POCT INR: INR: 1.1

## 2017-03-16 MED ORDER — WARFARIN SODIUM 2 MG PO TABS: 2.0000 mg | ORAL_TABLET | Freq: Every day | ORAL | 0 refills | Status: DC

## 2017-03-16 NOTE — Telephone Encounter (Signed)
Patient INR 1.1 Patient taking 2 mg po daily coumadin per patient family no change in meds or diet .     [facility] [place]

## 2017-03-16 NOTE — Telephone Encounter (Signed)
Result noted, see anticoagulation note in Epic. 

## 2017-03-23 ENCOUNTER — Other Ambulatory Visit: Payer: Self-pay

## 2017-03-23 DIAGNOSIS — I491 Atrial premature depolarization: Secondary | ICD-10-CM

## 2017-03-23 DIAGNOSIS — I493 Ventricular premature depolarization: Secondary | ICD-10-CM

## 2017-03-23 LAB — POCT INR: INR: 1.1

## 2017-03-23 MED ORDER — METOPROLOL TARTRATE 25 MG PO TABS: 25.0000 mg | ORAL_TABLET | Freq: Two times a day (BID) | ORAL | 1 refills | Status: DC

## 2017-03-24 ENCOUNTER — Ambulatory Visit (INDEPENDENT_AMBULATORY_CARE_PROVIDER_SITE_OTHER): Payer: Medicare Other

## 2017-03-25 ENCOUNTER — Other Ambulatory Visit
Admission: RE | Admit: 2017-03-25 | Discharge: 2017-03-25 | Disposition: A | Discharge status: Home / Self Care | Payer: Medicare Other | Source: Ambulatory Visit | Attending: Physician Assistant | Admitting: Physician Assistant

## 2017-03-25 DIAGNOSIS — I493 Ventricular premature depolarization: Secondary | ICD-10-CM | POA: Diagnosis present

## 2017-03-25 DIAGNOSIS — I491 Atrial premature depolarization: Secondary | ICD-10-CM | POA: Diagnosis present

## 2017-03-25 LAB — MAGNESIUM: Magnesium: 1.9 mg/dL (ref 1.7–2.4)

## 2017-03-25 LAB — TSH: TSH: 1.945 u[IU]/mL (ref 0.350–4.500)

## 2017-03-31 ENCOUNTER — Ambulatory Visit (INDEPENDENT_AMBULATORY_CARE_PROVIDER_SITE_OTHER): Payer: Medicare Other | Admitting: Internal Medicine

## 2017-03-31 ENCOUNTER — Encounter: Payer: Self-pay | Admitting: Internal Medicine

## 2017-03-31 ENCOUNTER — Ambulatory Visit (INDEPENDENT_AMBULATORY_CARE_PROVIDER_SITE_OTHER): Payer: Medicare Other | Admitting: *Deleted

## 2017-03-31 VITALS — BP 110/62 | HR 99 | Ht 67.5 in | Wt 161.0 lb

## 2017-03-31 DIAGNOSIS — I4819 Other persistent atrial fibrillation: Secondary | ICD-10-CM | POA: Insufficient documentation

## 2017-03-31 DIAGNOSIS — R0602 Shortness of breath: Secondary | ICD-10-CM | POA: Diagnosis not present

## 2017-03-31 DIAGNOSIS — I25118 Atherosclerotic heart disease of native coronary artery with other forms of angina pectoris: Secondary | ICD-10-CM | POA: Diagnosis not present

## 2017-03-31 DIAGNOSIS — I480 Paroxysmal atrial fibrillation: Secondary | ICD-10-CM

## 2017-03-31 DIAGNOSIS — I5032 Chronic diastolic (congestive) heart failure: Secondary | ICD-10-CM

## 2017-03-31 DIAGNOSIS — I10 Essential (primary) hypertension: Secondary | ICD-10-CM

## 2017-03-31 DIAGNOSIS — I824Y1 Acute embolism and thrombosis of unspecified deep veins of right proximal lower extremity: Secondary | ICD-10-CM | POA: Diagnosis not present

## 2017-03-31 LAB — POCT INR: INR: 3

## 2017-03-31 MED ORDER — HYDRALAZINE HCL 25 MG PO TABS: 25.0000 mg | ORAL_TABLET | Freq: Two times a day (BID) | ORAL | 3 refills | Status: DC

## 2017-03-31 MED ORDER — METOPROLOL TARTRATE 50 MG PO TABS
50.0000 mg | ORAL_TABLET | Freq: Two times a day (BID) | ORAL | 3 refills | Status: DC
Start: 2017-03-31 — End: 2017-04-27

## 2017-03-31 NOTE — Patient Instructions (Signed)
Medication Instructions:  Your physician has recommended you make the following change in your medication:  1- DECREASE Hydralazine to 25 mg by mouth twice a day. 2- INCREASE Metoprolol tartrate to 50 mg by mouth twice a day.   Labwork: none  Testing/Procedures: none  Follow-Up: Your physician recommends that you schedule a follow-up appointment in: 2-3 WEEKS WITH RYAN OR DR END.  Your physician recommends that you schedule a follow-up appointment Today in the [facility].   If you need a refill on your cardiac medications before your next appointment, please call your pharmacy.

## 2017-03-31 NOTE — Progress Notes (Signed)
Follow-up Outpatient Visit Date: 03/31/2017  Primary Care Provider: Suann Larry, [place] [address]  Chief Complaint: Shortness of breath  HPI:  Ms. Castor is a 76 y.o. year-old female with history of coronary artery disease status post remote PCI at [facility], chronic systolic and diastolic heart failure complicated by pulmonary hypertension, chronic kidney disease stage IV, Parkinson's disease, diabetes mellitus, COPD, DVT, hypertension, hyperlipidemia, and anxiety, who presents for follow-up . I met her this spring after a fall with resultant patellar fracture. During her hospitalization, she also experienced acute hypoxic respiratory failure secondary to decompensated heart failure with concurrent troponin elevation, peaking at 2.6. She was last seen in our office by Christell Faith on 02/08/17. She was subsequently admitted to the hospital on 02/15/17 due to shortness of breath. This was felt to be due to decompensated heart failure. Patient was given an event monitor after her last visit. This revealed sinus rhythm with frequent PVCs, PACs, and multiple episodes of atrial runs, suggestive of SVT.  Today, Ms. Zundel reports continued shortness of breath. She states that it is even worse than what she has noted in the past, though her daughter feels like she remains at her baseline. The dyspnea is present both at rest and with exertion. She has long-standing orthopnea and PND, which is unchanged. She reports that her weight has been stable. She has minimal leg swelling, worse on the right at the site of recently discovered infrapopliteal venous thrombus. She has been compliant with her medications and low-sodium diet. She denies chest pain and palpitations. She reports feeling "dizzy and lightheaded" all the time, though this is not any different than her baseline. She continues to have a significant tremor, albeit improved today compared to her  baseline.  --------------------------------------------------------------------------------------------------  Cardiovascular History & Procedures: Cardiovascular Problems:  Coronary artery disease  Chronic diastolic heart failure  Pulmonary hypertension  Risk Factors:  Known coronary artery disease, diabetes mellitus, hypertension, hyperlipidemia, and age greater than 8  Cath/PCI:  Remote PCI at [facility] (details unknown)  CV Surgery:  None  EP Procedures and Devices:  14-day event monitor (02/10/17): Sinus rhythm with frequent PVCs, occasional PACs, and multiple atrial runs including sustained atrial rhythm. Periods is most suggestive of SVT, though atrial flutter and/or fibrillation cannot be excluded.  Non-Invasive Evaluation(s):  TTE (01/26/17): Mild LVH with moderate focal basal hypertrophy of the septum. LVEF 45-50% with akinesis of the basal and mid inferolateral and inferior myocardium. Grade 2 diastolic dysfunction. Moderately thickened and calcified aortic valve with mild to moderate stenosis and mild regurgitation. Mitral annular calcification with mild to moderate regurgitation. Mild left atrial enlargement. Mildly dilated right ventricle with mildly reduced contraction. Mild right atrial enlargement. Moderate to severe pulmonary hypertension.  Recent CV Pertinent Labs: Lab Results  Component Value Date   INR 1.1 03/23/2017   INR 9.53 (HH) 02/16/2017   BNP 2,049.0 (H) 01/26/2017   K 3.8 02/16/2017   MG 1.9 03/25/2017   BUN 34 (H) 02/16/2017   CREATININE 2.32 (H) 02/16/2017    Past medical and surgical history were reviewed and updated in EPIC.  Outpatient Encounter Prescriptions as of 03/31/2017  Medication Sig  . acetaminophen (TYLENOL) 325 MG tablet Take 650 mg by mouth every 6 (six) hours as needed for mild pain.  Marland Kitchen atorvastatin (LIPITOR) 20 MG tablet Take 20 mg by mouth daily.  . blood glucose meter kit and supplies KIT Dispense based on patient and  insurance preference. Use up to four  times daily as directed. (FOR ICD-9 250.00, 250.01).  . Carbidopa-Levodopa ER (SINEMET CR) 25-100 MG tablet controlled release Take 2 tablets by mouth 4 (four) times daily.  . Coenzyme Q10 (COQ10) 50 MG CAPS Take 50 mg by mouth daily.  Marland Kitchen CRANBERRY PO Take 1 tablet by mouth daily.  Marland Kitchen escitalopram (LEXAPRO) 20 MG tablet Take 20 mg by mouth daily.  . furosemide (LASIX) 20 MG tablet Take 1 tablet (20 mg total) by mouth daily.  Marland Kitchen gabapentin (NEURONTIN) 300 MG capsule Take 300 mg by mouth daily.   Marland Kitchen glucose blood (ACCU-CHEK AVIVA PLUS) test strip Use as instructed  . hydrALAZINE (APRESOLINE) 50 MG tablet Take 1 tablet (50 mg total) by mouth 2 (two) times daily.  . insulin glargine (LANTUS) 100 UNIT/ML injection Inject 40 Units into the skin at bedtime.   . insulin lispro (HUMALOG) 100 UNIT/ML injection Inject 6 Units into the skin 3 (three) times daily with meals.   . isosorbide mononitrate (IMDUR) 60 MG 24 hr tablet Take 1 tablet (60 mg total) by mouth daily.  Marland Kitchen loratadine (CLARITIN) 10 MG tablet Take 5 mg by mouth daily as needed for allergies.   Marland Kitchen LORazepam (ATIVAN) 1 MG tablet Take 1 tablet (1 mg total) by mouth 2 (two) times daily as needed for anxiety.  . metoprolol tartrate (LOPRESSOR) 25 MG tablet Take 1 tablet (25 mg total) by mouth 2 (two) times daily.  . mirabegron ER (MYRBETRIQ) 25 MG TB24 tablet Take 25 mg by mouth daily.  . nitroGLYCERIN (NITROSTAT) 0.4 MG SL tablet Place 0.4 mg under the tongue every 5 (five) minutes as needed for chest pain.  . Omega-3 Fatty Acids (FISH OIL) 1000 MG CAPS Take 1,000 mg by mouth daily.  . polyethylene glycol (MIRALAX / GLYCOLAX) packet Take 17 g by mouth daily as needed for mild constipation.  . promethazine (PHENERGAN) 25 MG tablet Take 25 mg by mouth every 6 (six) hours as needed for nausea or vomiting.  . saccharomyces boulardii (FLORASTOR) 250 MG capsule Take 250 mg by mouth daily.  . traMADol (ULTRAM) 50 MG  tablet Take 1 tablet (50 mg total) by mouth daily as needed.  . warfarin (COUMADIN) 2 MG tablet Take 1 tablet (2 mg total) by mouth daily. Take As Directed per [facility]   No facility-administered encounter medications on file as of 03/31/2017.     Allergies: Patient has no known allergies.  Social History   Social History  . Marital status: Single    Spouse name: N/A  . Number of children: N/A  . Years of education: N/A   Occupational History  . Not on file.   Social History Main Topics  . Smoking status: Never Smoker  . Smokeless tobacco: Never Used  . Alcohol use No  . Drug use: No  . Sexual activity: No   Other Topics Concern  . Not on file   Social History Narrative  . No narrative on file    Family History  Problem Relation Age of Onset  . Cervical cancer Mother   . Kidney failure Father   . CAD Father   . CAD Brother   . Prostate cancer Neg Hx   . Kidney cancer Neg Hx   . Bladder Cancer Neg Hx     Review of Systems: A 12-system review of systems was performed and was negative except as noted in the HPI.  --------------------------------------------------------------------------------------------------  Physical Exam: BP 110/62 (BP Location: Left Arm, Patient Position: Sitting, Cuff Size:  Normal)   Pulse 99   Ht 5' 7.5" (1.715 m)   Wt 161 lb (73 kg)   SpO2 98%   BMI 24.84 kg/m   General:  Chronically ill-appearing woman seated in the exam room.. She is accompanied by her daughter. HEENT: No conjunctival pallor or scleral icterus.  Moist mucous membranes.  OP clear. Neck: Supple without lymphadenopathy, thyromegaly, JVD, or HJR. Lungs: Normal work of breathing.  Mildly diminished breath sounds throughout without wheezes or crackles. Heart: Irregularly irregular rhythm with 1/6 systolic murmur. No rubs or gallops.  Non-displaced PMI. Abd: Bowel sounds present.  Soft, NT/ND without hepatosplenomegaly Ext: Trace right and absent left pretibial  edema.  1+ radial pulses bilaterally. Skin: warm and dry without rash  EKG:  Atrial fibrillation (ventricular rate one 99 bpm) with leftward axis and lateral T-wave inversions. Compared with prior tracing from 02/15/17, atrial fibrillation is new.  Lab Results  Component Value Date   WBC 5.5 02/16/2017   HGB 10.5 (L) 02/16/2017   HCT 31.5 (L) 02/16/2017   MCV 87.2 02/16/2017   PLT 222 02/16/2017    Lab Results  Component Value Date   NA 144 02/16/2017   K 3.8 02/16/2017   CL 113 (H) 02/16/2017   CO2 24 02/16/2017   BUN 34 (H) 02/16/2017   CREATININE 2.32 (H) 02/16/2017   GLUCOSE 38 (LL) 02/16/2017   ALT <5 (L) 02/15/2017   TSH (03/25/17): 1.945  No results found for: CHOL, HDL, LDLCALC, LDLDIRECT, TRIG, CHOLHDL  --------------------------------------------------------------------------------------------------  ASSESSMENT AND PLAN: Paroxysmal atrial fibrillation Atrial fibrillation is noted today on EKG. Ms. Stines was noted to have episodes of sustained supraventricular arrhythmia on her event monitor last month. Given suboptimal heart rate controlled today, we will increase metoprolol tartrate to 50 mg twice a day. I will halve her hydralazine to ensure that she does not develop hypotension with increased dose of metoprolol. She is on warfarin for treatment of her DVT. We will continue this given newly discovered atrial fibrillation. If she continues to have significant shortness of breath with persistent atrial fibrillation, we will consider cardioversion once her INR has been therapeutic for a month.  Shortness of breath and chronic diastolic heart failure This is been a chronic problem for the patient that is likely multifactorial. Certainly her atrial fibrillation with suboptimal rate control is not helping. We will escalate metoprolol, as above. She appears euvolemic on exam with exception of trace right lower extremity edema in the setting of venous thrombosis. Her weight is  stable. I will not change her diuretic therapy today. She should continue to weigh herself regularly and take an additional furosemide if she gains more than 2 pounds in a day or 5 pounds in a week.  Coronary artery disease with stable angina Symptoms are largely unchanged from prior visits and hospitalizations. Her EKG today demonstrates lateral T-wave changes, similar to her prior tracing. In light of her comorbidities, particularly chronic kidney disease, we have agreed to defer any additional ischemia testing, particularly cardiac catheterization.  Hypertension Blood pressure is normal today. As above, we will decrease hydralazine to 25 mg twice a day and increase metoprolol tart tartrate 50 mg twice a day.  Follow-up: Return to clinic in 2-3 weeks.  Nelva Bush, MD 03/31/2017 9:10 PM

## 2017-04-03 ENCOUNTER — Emergency Department: Payer: Medicare Other

## 2017-04-03 ENCOUNTER — Encounter: Payer: Self-pay | Admitting: Emergency Medicine

## 2017-04-03 ENCOUNTER — Inpatient Hospital Stay
Admission: EM | Admit: 2017-04-03 | Discharge: 2017-04-09 | DRG: 291 | Disposition: A | Discharge status: Home Health | Payer: Medicare Other | Attending: Internal Medicine | Admitting: Internal Medicine

## 2017-04-03 DIAGNOSIS — Z955 Presence of coronary angioplasty implant and graft: Secondary | ICD-10-CM | POA: Diagnosis not present

## 2017-04-03 DIAGNOSIS — I2510 Atherosclerotic heart disease of native coronary artery without angina pectoris: Secondary | ICD-10-CM | POA: Diagnosis present

## 2017-04-03 DIAGNOSIS — J449 Chronic obstructive pulmonary disease, unspecified: Secondary | ICD-10-CM | POA: Diagnosis present

## 2017-04-03 DIAGNOSIS — I130 Hypertensive heart and chronic kidney disease with heart failure and stage 1 through stage 4 chronic kidney disease, or unspecified chronic kidney disease: Secondary | ICD-10-CM | POA: Diagnosis present

## 2017-04-03 DIAGNOSIS — I481 Persistent atrial fibrillation: Secondary | ICD-10-CM | POA: Diagnosis present

## 2017-04-03 DIAGNOSIS — Z86718 Personal history of other venous thrombosis and embolism: Secondary | ICD-10-CM

## 2017-04-03 DIAGNOSIS — Z79899 Other long term (current) drug therapy: Secondary | ICD-10-CM | POA: Diagnosis not present

## 2017-04-03 DIAGNOSIS — I5043 Acute on chronic combined systolic (congestive) and diastolic (congestive) heart failure: Secondary | ICD-10-CM | POA: Diagnosis present

## 2017-04-03 DIAGNOSIS — Z8249 Family history of ischemic heart disease and other diseases of the circulatory system: Secondary | ICD-10-CM

## 2017-04-03 DIAGNOSIS — D638 Anemia in other chronic diseases classified elsewhere: Secondary | ICD-10-CM | POA: Diagnosis present

## 2017-04-03 DIAGNOSIS — N189 Chronic kidney disease, unspecified: Secondary | ICD-10-CM

## 2017-04-03 DIAGNOSIS — G20 Parkinson's disease: Secondary | ICD-10-CM | POA: Diagnosis present

## 2017-04-03 DIAGNOSIS — D649 Anemia, unspecified: Secondary | ICD-10-CM | POA: Diagnosis not present

## 2017-04-03 DIAGNOSIS — N170 Acute kidney failure with tubular necrosis: Secondary | ICD-10-CM | POA: Diagnosis not present

## 2017-04-03 DIAGNOSIS — G629 Polyneuropathy, unspecified: Secondary | ICD-10-CM | POA: Diagnosis present

## 2017-04-03 DIAGNOSIS — E11649 Type 2 diabetes mellitus with hypoglycemia without coma: Secondary | ICD-10-CM | POA: Diagnosis present

## 2017-04-03 DIAGNOSIS — F329 Major depressive disorder, single episode, unspecified: Secondary | ICD-10-CM | POA: Diagnosis present

## 2017-04-03 DIAGNOSIS — N179 Acute kidney failure, unspecified: Secondary | ICD-10-CM | POA: Diagnosis present

## 2017-04-03 DIAGNOSIS — I255 Ischemic cardiomyopathy: Secondary | ICD-10-CM | POA: Diagnosis not present

## 2017-04-03 DIAGNOSIS — R0603 Acute respiratory distress: Secondary | ICD-10-CM | POA: Diagnosis not present

## 2017-04-03 DIAGNOSIS — I5023 Acute on chronic systolic (congestive) heart failure: Secondary | ICD-10-CM | POA: Diagnosis not present

## 2017-04-03 DIAGNOSIS — E785 Hyperlipidemia, unspecified: Secondary | ICD-10-CM | POA: Diagnosis present

## 2017-04-03 DIAGNOSIS — Z794 Long term (current) use of insulin: Secondary | ICD-10-CM | POA: Diagnosis not present

## 2017-04-03 DIAGNOSIS — I509 Heart failure, unspecified: Secondary | ICD-10-CM

## 2017-04-03 DIAGNOSIS — Z515 Encounter for palliative care: Secondary | ICD-10-CM | POA: Diagnosis not present

## 2017-04-03 DIAGNOSIS — I5041 Acute combined systolic (congestive) and diastolic (congestive) heart failure: Secondary | ICD-10-CM | POA: Diagnosis not present

## 2017-04-03 DIAGNOSIS — Z23 Encounter for immunization: Secondary | ICD-10-CM | POA: Diagnosis not present

## 2017-04-03 DIAGNOSIS — D62 Acute posthemorrhagic anemia: Secondary | ICD-10-CM | POA: Diagnosis not present

## 2017-04-03 DIAGNOSIS — E1122 Type 2 diabetes mellitus with diabetic chronic kidney disease: Secondary | ICD-10-CM | POA: Diagnosis present

## 2017-04-03 DIAGNOSIS — Z7901 Long term (current) use of anticoagulants: Secondary | ICD-10-CM

## 2017-04-03 DIAGNOSIS — I4891 Unspecified atrial fibrillation: Secondary | ICD-10-CM | POA: Diagnosis not present

## 2017-04-03 DIAGNOSIS — N184 Chronic kidney disease, stage 4 (severe): Secondary | ICD-10-CM | POA: Diagnosis present

## 2017-04-03 DIAGNOSIS — Z7189 Other specified counseling: Secondary | ICD-10-CM | POA: Diagnosis not present

## 2017-04-03 DIAGNOSIS — J9601 Acute respiratory failure with hypoxia: Secondary | ICD-10-CM | POA: Diagnosis not present

## 2017-04-03 DIAGNOSIS — K921 Melena: Secondary | ICD-10-CM | POA: Diagnosis present

## 2017-04-03 DIAGNOSIS — I2699 Other pulmonary embolism without acute cor pulmonale: Secondary | ICD-10-CM | POA: Diagnosis not present

## 2017-04-03 DIAGNOSIS — I482 Chronic atrial fibrillation: Secondary | ICD-10-CM | POA: Diagnosis not present

## 2017-04-03 DIAGNOSIS — N186 End stage renal disease: Secondary | ICD-10-CM | POA: Diagnosis not present

## 2017-04-03 DIAGNOSIS — R410 Disorientation, unspecified: Secondary | ICD-10-CM | POA: Diagnosis not present

## 2017-04-03 DIAGNOSIS — I82409 Acute embolism and thrombosis of unspecified deep veins of unspecified lower extremity: Secondary | ICD-10-CM | POA: Diagnosis not present

## 2017-04-03 LAB — URINALYSIS, COMPLETE (UACMP) WITH MICROSCOPIC
Bilirubin Urine: NEGATIVE
GLUCOSE, UA: 50 mg/dL — AB
HGB URINE DIPSTICK: NEGATIVE
KETONES UR: NEGATIVE mg/dL
NITRITE: NEGATIVE
PROTEIN: 100 mg/dL — AB
Specific Gravity, Urine: 1.014 (ref 1.005–1.030)
pH: 5 (ref 5.0–8.0)

## 2017-04-03 LAB — COMPREHENSIVE METABOLIC PANEL
ALK PHOS: 66 U/L (ref 38–126)
ANION GAP: 7 (ref 5–15)
AST: 16 U/L (ref 15–41)
Albumin: 3.4 g/dL — ABNORMAL LOW (ref 3.5–5.0)
BILIRUBIN TOTAL: 0.6 mg/dL (ref 0.3–1.2)
BUN: 50 mg/dL — ABNORMAL HIGH (ref 6–20)
CALCIUM: 8.4 mg/dL — AB (ref 8.9–10.3)
CO2: 23 mmol/L (ref 22–32)
CREATININE: 2.53 mg/dL — AB (ref 0.44–1.00)
Chloride: 108 mmol/L (ref 101–111)
GFR calc Af Amer: 20 mL/min — ABNORMAL LOW (ref >60)
GFR, EST NON AFRICAN AMERICAN: 17 mL/min — AB (ref >60)
Glucose, Bld: 158 mg/dL — ABNORMAL HIGH (ref 65–99)
Potassium: 5 mmol/L (ref 3.5–5.1)
Sodium: 138 mmol/L (ref 135–145)
TOTAL PROTEIN: 6.1 g/dL — AB (ref 6.5–8.1)

## 2017-04-03 LAB — PROTIME-INR
INR: 2.99
Prothrombin Time: 31.7 seconds — ABNORMAL HIGH (ref 11.4–15.2)

## 2017-04-03 LAB — CBC
HEMATOCRIT: 28.6 % — AB (ref 35.0–47.0)
HEMOGLOBIN: 9.6 g/dL — AB (ref 12.0–16.0)
MCH: 29 pg (ref 26.0–34.0)
MCHC: 33.7 g/dL (ref 32.0–36.0)
MCV: 85.9 fL (ref 80.0–100.0)
Platelets: 146 10*3/uL — ABNORMAL LOW (ref 150–440)
RBC: 3.33 MIL/uL — ABNORMAL LOW (ref 3.80–5.20)
RDW: 15 % — AB (ref 11.5–14.5)
WBC: 4.6 10*3/uL (ref 3.6–11.0)

## 2017-04-03 LAB — TROPONIN I: TROPONIN I: 0.03 ng/mL — AB (ref <0.03)

## 2017-04-03 LAB — BRAIN NATRIURETIC PEPTIDE: B Natriuretic Peptide: 1139 pg/mL — ABNORMAL HIGH (ref 0.0–100.0)

## 2017-04-03 LAB — MAGNESIUM: Magnesium: 2 mg/dL (ref 1.7–2.4)

## 2017-04-03 MED ORDER — FUROSEMIDE 10 MG/ML IJ SOLN
40.0000 mg | Freq: Once | INTRAMUSCULAR | Status: AC
  Administered 2017-04-03: 40 mg via INTRAVENOUS
  Filled 2017-04-03: qty 4

## 2017-04-03 MED ORDER — ATORVASTATIN CALCIUM 20 MG PO TABS
20.0000 mg | ORAL_TABLET | Freq: Every day | ORAL | Status: DC
  Administered 2017-04-04 – 2017-04-09 (×6): 20 mg via ORAL
  Filled 2017-04-03 (×6): qty 1

## 2017-04-03 MED ORDER — SODIUM CHLORIDE 0.9 % IV SOLN
8.0000 mg/h | INTRAVENOUS | Status: AC
  Administered 2017-04-04 – 2017-04-06 (×5): 8 mg/h via INTRAVENOUS
  Filled 2017-04-03 (×7): qty 80

## 2017-04-03 MED ORDER — ONDANSETRON HCL 4 MG/2ML IJ SOLN: 4.0000 mg | Freq: Four times a day (QID) | INTRAMUSCULAR | Status: DC | PRN

## 2017-04-03 MED ORDER — TRAMADOL HCL 50 MG PO TABS
50.0000 mg | ORAL_TABLET | Freq: Every day | ORAL | Status: DC | PRN
Start: 2017-04-03 — End: 2017-04-09

## 2017-04-03 MED ORDER — LORATADINE 10 MG PO TABS: 5.0000 mg | ORAL_TABLET | Freq: Every day | ORAL | Status: DC | PRN

## 2017-04-03 MED ORDER — MIRABEGRON ER 25 MG PO TB24
25.0000 mg | ORAL_TABLET | Freq: Every day | ORAL | Status: DC
  Administered 2017-04-04 – 2017-04-09 (×6): 25 mg via ORAL
  Filled 2017-04-03 (×6): qty 1

## 2017-04-03 MED ORDER — PROMETHAZINE HCL 25 MG PO TABS
25.0000 mg | ORAL_TABLET | Freq: Four times a day (QID) | ORAL | Status: DC | PRN
  Administered 2017-04-07: 25 mg via ORAL
  Filled 2017-04-03 (×2): qty 1

## 2017-04-03 MED ORDER — METOPROLOL TARTRATE 50 MG PO TABS
50.0000 mg | ORAL_TABLET | Freq: Once | ORAL | Status: AC
Start: 2017-04-03 — End: 2017-04-03
  Administered 2017-04-03: 50 mg via ORAL
  Filled 2017-04-03: qty 1

## 2017-04-03 MED ORDER — SODIUM CHLORIDE 0.9 % IV SOLN
80.0000 mg | Freq: Once | INTRAVENOUS | Status: AC
  Administered 2017-04-04: 80 mg via INTRAVENOUS
  Filled 2017-04-03: qty 80

## 2017-04-03 MED ORDER — ACETAMINOPHEN 325 MG PO TABS
650.0000 mg | ORAL_TABLET | Freq: Four times a day (QID) | ORAL | Status: DC | PRN
  Administered 2017-04-06: 650 mg via ORAL
  Filled 2017-04-03: qty 2

## 2017-04-03 MED ORDER — FUROSEMIDE 40 MG PO TABS
20.0000 mg | ORAL_TABLET | Freq: Every day | ORAL | Status: DC
  Administered 2017-04-04: 20 mg via ORAL
  Filled 2017-04-03: qty 1

## 2017-04-03 MED ORDER — GABAPENTIN 300 MG PO CAPS
300.0000 mg | ORAL_CAPSULE | Freq: Every day | ORAL | Status: DC
  Administered 2017-04-04 – 2017-04-09 (×6): 300 mg via ORAL
  Filled 2017-04-03 (×6): qty 1

## 2017-04-03 MED ORDER — SODIUM CHLORIDE 0.9% FLUSH
3.0000 mL | Freq: Two times a day (BID) | INTRAVENOUS | Status: DC
  Administered 2017-04-04: 3 mL via INTRAVENOUS

## 2017-04-03 MED ORDER — LORAZEPAM 1 MG PO TABS
1.0000 mg | ORAL_TABLET | Freq: Two times a day (BID) | ORAL | Status: DC | PRN
  Administered 2017-04-09: 1 mg via ORAL
  Filled 2017-04-03: qty 1

## 2017-04-03 MED ORDER — HYDRALAZINE HCL 50 MG PO TABS
25.0000 mg | ORAL_TABLET | Freq: Two times a day (BID) | ORAL | Status: DC
  Administered 2017-04-04 (×2): 25 mg via ORAL
  Filled 2017-04-03 (×2): qty 1

## 2017-04-03 MED ORDER — ISOSORBIDE MONONITRATE ER 60 MG PO TB24
60.0000 mg | ORAL_TABLET | Freq: Every day | ORAL | Status: DC
  Administered 2017-04-04 – 2017-04-09 (×6): 60 mg via ORAL
  Filled 2017-04-03 (×6): qty 1

## 2017-04-03 MED ORDER — SODIUM CHLORIDE 0.9 % IV SOLN: 250.0000 mL | INTRAVENOUS | Status: DC | PRN

## 2017-04-03 MED ORDER — INSULIN GLARGINE 100 UNIT/ML ~~LOC~~ SOLN
20.0000 [IU] | Freq: Every day | SUBCUTANEOUS | Status: DC
  Administered 2017-04-04: 20 [IU] via SUBCUTANEOUS
  Filled 2017-04-03 (×3): qty 0.2

## 2017-04-03 MED ORDER — NITROGLYCERIN 0.4 MG SL SUBL: 0.4000 mg | SUBLINGUAL_TABLET | SUBLINGUAL | Status: DC | PRN

## 2017-04-03 MED ORDER — SACCHAROMYCES BOULARDII 250 MG PO CAPS
250.0000 mg | ORAL_CAPSULE | Freq: Every day | ORAL | Status: DC
  Administered 2017-04-04 – 2017-04-09 (×6): 250 mg via ORAL
  Filled 2017-04-03 (×6): qty 1

## 2017-04-03 MED ORDER — CARBIDOPA-LEVODOPA ER 25-100 MG PO TBCR
2.0000 | EXTENDED_RELEASE_TABLET | Freq: Four times a day (QID) | ORAL | Status: DC
  Administered 2017-04-04 – 2017-04-09 (×22): 2 via ORAL
  Filled 2017-04-03 (×30): qty 2

## 2017-04-03 MED ORDER — ESCITALOPRAM OXALATE 10 MG PO TABS
20.0000 mg | ORAL_TABLET | Freq: Every day | ORAL | Status: DC
  Administered 2017-04-04 – 2017-04-07 (×4): 20 mg via ORAL
  Filled 2017-04-03 (×4): qty 2

## 2017-04-03 MED ORDER — METOPROLOL TARTRATE 50 MG PO TABS: 50.0000 mg | ORAL_TABLET | Freq: Two times a day (BID) | ORAL | Status: DC

## 2017-04-03 MED ORDER — INSULIN ASPART 100 UNIT/ML ~~LOC~~ SOLN
0.0000 [IU] | SUBCUTANEOUS | Status: DC
  Administered 2017-04-04: 2 [IU] via SUBCUTANEOUS
  Filled 2017-04-03: qty 1

## 2017-04-03 MED ORDER — PANTOPRAZOLE SODIUM 40 MG IV SOLR
40.0000 mg | Freq: Two times a day (BID) | INTRAVENOUS | Status: DC
  Administered 2017-04-07 – 2017-04-08 (×4): 40 mg via INTRAVENOUS
  Filled 2017-04-03 (×4): qty 40

## 2017-04-03 MED ORDER — SODIUM CHLORIDE 0.9% FLUSH: 3.0000 mL | INTRAVENOUS | Status: DC | PRN

## 2017-04-03 MED ORDER — POLYETHYLENE GLYCOL 3350 17 G PO PACK: 17.0000 g | PACK | Freq: Every day | ORAL | Status: DC | PRN

## 2017-04-03 NOTE — ED Notes (Signed)
IV access attempted x 2, once by this RN, once by Sherrill Raringuss, EMTP

## 2017-04-03 NOTE — ED Triage Notes (Signed)
Pt arrived via POV from home with grandson, states she has been having black stool, increased weakness and lightheadedness, nausea, decreased appetite for several days. States this morning she had chest pain and took NTG at home around 0800 states chest pain went away after NTG and does not c/o chest pain currently. Pt states she feels like she could pass out. Pt has baseline tremor which she states have not changed since sxs began.

## 2017-04-03 NOTE — ED Provider Notes (Signed)
[facility] Emergency Department Provider Note  ____________________________________________  Time seen: Approximately 7:12 PM  I have reviewed the triage vital signs and the nursing notes.   HISTORY  Chief Complaint Weakness and Melena   HPI Molly Wolf is a 76 y.o. female with a history of CHF, CKD, CAD, COPD, diabetes,DVT on coumadin who presents for evaluation of generalized weakness and dizziness. Patient reports that she has been feeling like this for 2 days. She has noted that her stools have been black since yesterday. One bowel movement a day. No diarrhea. No prior history of GI bleed. Patient has been on Coumadin since April when she was found to have a DVT after a patella fracture. Patient endorses mild shortness of breath and a cough productive of white phlegm. She has orthopnea which has been chronic for months. She saw her cardiologist Dr. Saunders Revel three days ago and had her metoprolol increased to 72m BID due to poorly controlled ventricular rate. No fever or chills, no vomiting, no diarrhea, no abdominal pain. Earlier this afternoon patient had an episode of chest pressure that she describes as moderate, located in the center of her chest that resolved after 1 sublingual nitroglycerin. She has had several episodes of dizziness feeling like she is going to pass out today. No headache, no slurred speech, no unilateral weakness or numbness.  Past Medical History:  Diagnosis Date  . Anxiety   . Arthritis   . Chronic combined systolic and diastolic CHF (congestive heart failure) ([facility]    a. 01/2008 Echo ([facility]): EF > 55%, mod-sev LVH w/ grade 1 DD, biatrial enlargement, mild AS, trace MR/TR; b. EF 45-50%, GR2DD, mild to moderate aortic stenosis, mild aortic insufficieny, mild to moderate mitral regurgitation, mild tricuspid regurgitation, mild biatrial enlargement, and a small pericardial effusion  . CKD (chronic kidney disease), stage IV ([facility]   . Closed  patellar sleeve fracture of right knee    a. 01/2017 - conservatively managed.  .Marland KitchenCOPD (chronic obstructive pulmonary disease) ([facility]   . Coronary artery disease    a. s/p remote stenting of unknown vessel @ [facility].  . Depression   . Diabetes mellitus with complication ([place]   . DVT (deep venous thrombosis) ([facility])    a. in the setting of right patellar fracture on Coumadin  . Hyperlipidemia   . Hypertension   . Parkinson disease ([facility]     Patient Active Problem List   Diagnosis Date Noted  . Coronary artery disease of native artery of native heart with stable angina pectoris ([place] 03/31/2017  . Paroxysmal atrial fibrillation ([facility] 03/31/2017  . Chronic diastolic heart failure ([facility] 02/26/2017  . HTN (hypertension) 02/26/2017  . Parkinson disease ([facility] 02/26/2017  . Intractable pain 12/27/2016    Past Surgical History:  Procedure Laterality Date  . BACK SURGERY    . CORONARY ANGIOPLASTY WITH STENT PLACEMENT    . NECK SURGERY      Prior to Admission medications   Medication Sig Start Date End Date Taking? Authorizing Provider  acetaminophen (TYLENOL) 325 MG tablet Take 650 mg by mouth every 6 (six) hours as needed for mild pain.    [provider]  atorvastatin (LIPITOR) 20 MG tablet Take 20 mg by mouth daily.    [provider]  blood glucose meter kit and supplies KIT Dispense based on patient and insurance preference. Use up to four times daily as directed. (FOR ICD-9 250.00, 250.01). 11/06/16   VRudene Re MD  Carbidopa-Levodopa ER ([facility]  CR) 25-100 MG tablet controlled release Take 2 tablets by mouth 4 (four) times daily. 03/05/17   Darylene Price A, FNP  Coenzyme Q10 (COQ10) 50 MG CAPS Take 50 mg by mouth daily.    [provider]  CRANBERRY PO Take 1 tablet by mouth daily.    [provider]  escitalopram (LEXAPRO) 20 MG tablet Take 20 mg by mouth daily.    [provider]  furosemide (LASIX) 20 MG tablet Take 1 tablet (20 mg  total) by mouth daily. 03/05/17   Alisa Graff, FNP  gabapentin (NEURONTIN) 300 MG capsule Take 300 mg by mouth daily.     [provider]  glucose blood (ACCU-CHEK AVIVA PLUS) test strip Use as instructed 02/25/17   Darylene Price A, FNP  hydrALAZINE (APRESOLINE) 25 MG tablet Take 1 tablet (25 mg total) by mouth 2 (two) times daily. 03/31/17 06/29/17  End, Harrell Gave, MD  insulin glargine (LANTUS) 100 UNIT/ML injection Inject 40 Units into the skin at bedtime.     [provider]  insulin lispro (HUMALOG) 100 UNIT/ML injection Inject 6 Units into the skin 3 (three) times daily with meals.     [provider]  isosorbide mononitrate (IMDUR) 60 MG 24 hr tablet Take 1 tablet (60 mg total) by mouth daily. 03/05/17   Alisa Graff, FNP  loratadine (CLARITIN) 10 MG tablet Take 5 mg by mouth daily as needed for allergies.     [provider]  LORazepam (ATIVAN) 1 MG tablet Take 1 tablet (1 mg total) by mouth 2 (two) times daily as needed for anxiety. 12/29/16   Max Sane, MD  metoprolol tartrate (LOPRESSOR) 50 MG tablet Take 1 tablet (50 mg total) by mouth 2 (two) times daily. 03/31/17 06/29/17  End, Harrell Gave, MD  mirabegron ER (MYRBETRIQ) 25 MG TB24 tablet Take 25 mg by mouth daily.    [provider]  nitroGLYCERIN (NITROSTAT) 0.4 MG SL tablet Place 0.4 mg under the tongue every 5 (five) minutes as needed for chest pain.    [provider]  Omega-3 Fatty Acids (FISH OIL) 1000 MG CAPS Take 1,000 mg by mouth daily.    [provider]  polyethylene glycol (MIRALAX / GLYCOLAX) packet Take 17 g by mouth daily as needed for mild constipation.    [provider]  promethazine (PHENERGAN) 25 MG tablet Take 25 mg by mouth every 6 (six) hours as needed for nausea or vomiting.    [provider]  saccharomyces boulardii (FLORASTOR) 250 MG capsule Take 250 mg by mouth daily.    [provider]  traMADol (ULTRAM) 50 MG tablet  Take 1 tablet (50 mg total) by mouth daily as needed. 12/29/16   Max Sane, MD  warfarin (COUMADIN) 2 MG tablet Take 1 tablet (2 mg total) by mouth daily. Take As Directed per [facility] 03/16/17   End, Harrell Gave, MD    Allergies Patient has no known allergies.  Family History  Problem Relation Age of Onset  . Cervical cancer Mother   . Kidney failure Father   . CAD Father   . CAD Brother   . Prostate cancer Neg Hx   . Kidney cancer Neg Hx   . Bladder Cancer Neg Hx     Social History Social History  Substance Use Topics  . Smoking status: Never Smoker  . Smokeless tobacco: Never Used  . Alcohol use No    Review of Systems  Constitutional: Negative for fever. + generalized weakness  and dizziness Eyes: Negative for visual changes. ENT: Negative for sore throat. Neck: No neck pain  Cardiovascular: + chest pain. Respiratory: +shortness of breath and cough Gastrointestinal: Negative for abdominal pain, vomiting or diarrhea. + black stool Genitourinary: Negative for dysuria. Musculoskeletal: Negative for back pain. Skin: Negative for rash. Neurological: Negative for headaches, weakness or numbness. Psych: No SI or HI  ____________________________________________   PHYSICAL EXAM:  VITAL SIGNS: ED Triage Vitals  Enc Vitals Group     BP 04/03/17 1836 (!) 133/93     Pulse Rate 04/03/17 1836 (!) 117     Resp 04/03/17 1836 20     Temp 04/03/17 1836 98.1 F (36.7 C)     Temp Source 04/03/17 1836 Oral     SpO2 04/03/17 1836 97 %     Weight 04/03/17 1836 161 lb (73 kg)     Height 04/03/17 1836 5' 7.5" (1.715 m)     Head Circumference --      Peak Flow --      Pain Score 04/03/17 1835 0     Pain Loc --      Pain Edu? --      Excl. in [place]? --     Constitutional: Alert and oriented. Well appearing and in no apparent distress. HEENT:      Head: Normocephalic and atraumatic.         Eyes: Conjunctivae are normal. Sclera is non-icteric.       Mouth/Throat: Mucous  membranes are moist.       Neck: Supple with no signs of meningismus. Cardiovascular: Irregularly irregular rhythm with tachycardic rate. No murmurs, gallops, or rubs. 2+ symmetrical distal pulses are present in all extremities. No JVD. Respiratory: Normal respiratory effort. Lungs are clear to auscultation bilaterally. No wheezes, crackles, or rhonchi.  Gastrointestinal: Soft, non tender, and non distended with positive bowel sounds. No rebound or guarding. Genitourinary: No CVA tenderness. Rectal exam showing black stool guaiac negative Musculoskeletal: Trace pitting edema on RLE and normal on left which is chronic. Nontender with normal range of motion in all extremities. No edema, cyanosis, or erythema of extremities. Neurologic: Normal speech and language. Face is symmetric. Moving all extremities. No gross focal neurologic deficits are appreciated. Skin: Skin is warm, dry and intact. No rash noted. Psychiatric: Mood and affect are normal. Speech and behavior are normal.  ____________________________________________   LABS (all labs ordered are listed, but only abnormal results are displayed)  Labs Reviewed  CBC - Abnormal; Notable for the following:       Result Value   RBC 3.33 (*)    Hemoglobin 9.6 (*)    HCT 28.6 (*)    RDW 15.0 (*)    Platelets 146 (*)    All other components within normal limits  PROTIME-INR - Abnormal; Notable for the following:    Prothrombin Time 31.7 (*)    All other components within normal limits  COMPREHENSIVE METABOLIC PANEL - Abnormal; Notable for the following:    Glucose, Bld 158 (*)    BUN 50 (*)    Creatinine, Ser 2.53 (*)    Calcium 8.4 (*)    Total Protein 6.1 (*)    Albumin 3.4 (*)    ALT <5 (*)    GFR calc non Af Amer 17 (*)    GFR calc Af Amer 20 (*)    All other components within normal limits  BRAIN NATRIURETIC PEPTIDE - Abnormal; Notable for the following:    B Natriuretic Peptide  1,139.0 (*)    All other components within  normal limits  MAGNESIUM  URINALYSIS, COMPLETE (UACMP) WITH MICROSCOPIC  TROPONIN I  CBG MONITORING, ED  POC OCCULT BLOOD, ED  TYPE AND SCREEN  TYPE AND SCREEN   ____________________________________________  EKG  ED ECG REPORT I, Rudene Re, the attending physician, personally viewed and interpreted this ECG.  Atrial fibrillation, rate of 123, incomplete left bundle branch block, prolonged QTC, normal axis, no ST elevations or depressions. Unchanged from prior from 03/31/17. ____________________________________________  RADIOLOGY  CXR: Massive cardiomegaly and bibasilar atelectasis without pulmonary edema. ____________________________________________   PROCEDURES  Procedure(s) performed: None Procedures Critical Care performed:  None ____________________________________________   INITIAL IMPRESSION / ASSESSMENT AND PLAN / ED COURSE  76 y.o. female with a history of CHF, CKD, CAD, COPD, diabetes,DVT on coumadin who presents for evaluation of generalized weakness, SOB, cough, and lightheadedness. Also complaining of black stools which she has on rectal exam however those are guaiac negative. Patient is atrial fibrillation with poorly controlled ventricular rate. Had her metoprolol increased to 74m BID 3 days ago by Dr. ESaunders Revel Asymmetric swelling of the RLE is again noted and unchanged from prior. Lungs are CTAB. Will check for PNA, dehydration, UTI, electrolyte abnormalities, ACS, CHF exacerbation. Patient with one episode of CP earlier today, will check troponin. No CP now and unchanged EKG.     _________________________ 8:15 PM on 04/03/2017 -----------------------------------------  Chest x-ray concerning for massive cardiomegaly, BNP elevated at 1200. Acute on chronic kidney disease and worsening uremia with BUN 50. Will give 42mof IV lasix and admit. HR is well controlled, patient due for evening lopressor which will be given in the ED.   Pertinent labs &  imaging results that were available during my care of the patient were reviewed by me and considered in my medical decision making (see chart for details).    ____________________________________________   FINAL CLINICAL IMPRESSION(S) / ED DIAGNOSES  Final diagnoses:  Acute on chronic congestive heart failure, unspecified heart failure type ([place] Acute renal failure superimposed on chronic kidney disease, unspecified CKD stage, unspecified acute renal failure type (HCM[place]     NEW MEDICATIONS STARTED DURING THIS VISIT:  New Prescriptions   No medications on file     Note:  This document was prepared using Dragon voice recognition software and may include unintentional dictation errors.    VeAlfred LevinsCaKentuckyMD 04/03/17 2016

## 2017-04-03 NOTE — ED Notes (Addendum)
Assisted pt to toilet 

## 2017-04-03 NOTE — H&P (Signed)
History and Physical   [facility] [place] @ [facility] Admission History and Physical [employer], D.O.    Patient Name: Molly Wolf MR#: [id number] Date of Birth: 1941-08-03 Date of Admission: 04/03/2017  Referring MD/NP/PA: Dr. Alfred Levins Primary Care Physician: Suann Larry, MD Patient coming from: Home Outpatient Specialists: Dr. Saunders Revel   Chief Complaint:  Chief Complaint  Patient presents with  . Weakness  . Melena    HPI: Molly Wolf is a 76 y.o. female with a known history of Chronic combined systolic and diastolic heart failure, CKD4, pulmonary hypertension, CAD status post PCI, Parkinson's, diabetes, COPD, hypertension, hyperlipidemia, DVT on Coumadin, atrial fibrillation diagnosed 03/31/2017 presents to the emergency department for evaluation of shortness of breath.  Patient presents today complaining of generalized weakness and dizziness for the past 2 days associated with worsening shortness of breath, cough productive of white phlegm, 2 pillow orthopnea which is chronic and worsening. She also complained of chest pressure associated with dizziness and near-syncope. Pressure resolved with nitroglycerin. She also reports dark black stools for the past 2 days. Of note she has been on Coumadin since April 2018 for a DVT following a patellar fracture. Patient denies fevers/chills, N/V/C/D, abdominal pain, dysuria/frequency, changes in mental status.    Of note patient had a hospitalization in May 2018 for decompensated congestive heart failure. She was seen by cardiology Dr. Saunders Revel on 03/31/2017 with new diagnosis of paroxysmal atrial fibrillation for which her metoprolol was increased to 50 mg twice a day and her hydralazine was decreased to 25 mg twice a day. Warfarin was continued for both treatment of her DVT and stroke prophylaxis in the presence of atrial fibrillation. The plan was to consider cardioversion after her INR had been therapeutic for 1 month per  cardiology notes.   Otherwise there has been no change in status. Patient has been taking medication as prescribed and there has been no recent change in diet.  No recent antibiotics.  There has been no recent illness, travel or sick contacts.   EMS/ED Course: Patient received Lasix for CHF and metoprolol for rapid afib. Medical admission was requested for further management of CHF, GIB. Marland Kitchen  Review of Systems:  CONSTITUTIONAL: Positive generalized weakness and dizziness. No fever/chills, fatigue, weight gain/loss, headache. EYES: No blurry or double vision. ENT: No tinnitus, postnasal drip, redness or soreness of the oropharynx. RESPIRATORY: Positivedyspnea, orthopnea, cough.  No hemoptysis.  CARDIOVASCULAR: Positive chest pain. No palpitations, syncope, orthopnea. No lower extremity edema.  GASTROINTESTINAL: No nausea, vomiting, abdominal pain, diarrhea, constipation.  Positive melena No hematemesis or hematochezia. GENITOURINARY: No dysuria, frequency, hematuria. ENDOCRINE: No polyuria or nocturia. No heat or cold intolerance. HEMATOLOGY: No anemia, bruising, bleeding. INTEGUMENTARY: No rashes, ulcers, lesions. MUSCULOSKELETAL: No arthritis, gout, dyspnea. NEUROLOGIC: No numbness, tingling, ataxia, seizure-type activity, weakness. PSYCHIATRIC: No anxiety, depression, insomnia.   Past Medical History:  Diagnosis Date  . Anxiety   . Arthritis   . Chronic combined systolic and diastolic CHF (congestive heart failure) ([place])    a. 01/2008 Echo ([facility]): EF > 55%, mod-sev LVH w/ grade 1 DD, biatrial enlargement, mild AS, trace MR/TR; b. EF 45-50%, GR2DD, mild to moderate aortic stenosis, mild aortic insufficieny, mild to moderate mitral regurgitation, mild tricuspid regurgitation, mild biatrial enlargement, and a small pericardial effusion  . CKD (chronic kidney disease), stage IV ([place])   . Closed patellar sleeve fracture of right knee    a. 01/2017 - conservatively managed.  Marland Kitchen COPD (chronic  obstructive pulmonary disease) ([place])   .  Coronary artery disease    a. s/p remote stenting of unknown vessel @ [facility].  . Depression   . Diabetes mellitus with complication ([place])   . DVT (deep venous thrombosis) (HCC)    a. in the setting of right patellar fracture on Coumadin  . Hyperlipidemia   . Hypertension   . Parkinson disease [facility])     Past Surgical History:  Procedure Laterality Date  . BACK SURGERY    . CORONARY ANGIOPLASTY WITH STENT PLACEMENT    . NECK SURGERY       reports that she has never smoked. She has never used smokeless tobacco. She reports that she does not drink alcohol or use drugs.  No Known Allergies  Family History  Problem Relation Age of Onset  . Cervical cancer Mother   . Kidney failure Father   . CAD Father   . CAD Brother   . Prostate cancer Neg Hx   . Kidney cancer Neg Hx   . Bladder Cancer Neg Hx     Prior to Admission medications   Medication Sig Start Date End Date Taking? Authorizing Provider  acetaminophen (TYLENOL) 325 MG tablet Take 650 mg by mouth every 6 (six) hours as needed for mild pain.    [provider]  atorvastatin (LIPITOR) 20 MG tablet Take 20 mg by mouth daily.    [provider]  blood glucose meter kit and supplies KIT Dispense based on patient and insurance preference. Use up to four times daily as directed. (FOR ICD-9 250.00, 250.01). 11/06/16   Rudene Re, MD  Carbidopa-Levodopa ER (SINEMET CR) 25-100 MG tablet controlled release Take 2 tablets by mouth 4 (four) times daily. 03/05/17   Darylene Price A, FNP  Coenzyme Q10 (COQ10) 50 MG CAPS Take 50 mg by mouth daily.    [provider]  CRANBERRY PO Take 1 tablet by mouth daily.    [provider]  escitalopram (LEXAPRO) 20 MG tablet Take 20 mg by mouth daily.    [provider]  furosemide (LASIX) 20 MG tablet Take 1 tablet (20 mg total) by mouth daily. 03/05/17   Alisa Graff, FNP  gabapentin (NEURONTIN) 300 MG  capsule Take 300 mg by mouth daily.     [provider]  glucose blood (ACCU-CHEK AVIVA PLUS) test strip Use as instructed 02/25/17   Darylene Price A, FNP  hydrALAZINE (APRESOLINE) 25 MG tablet Take 1 tablet (25 mg total) by mouth 2 (two) times daily. 03/31/17 06/29/17  End, Harrell Gave, MD  insulin glargine (LANTUS) 100 UNIT/ML injection Inject 40 Units into the skin at bedtime.     [provider]  insulin lispro (HUMALOG) 100 UNIT/ML injection Inject 6 Units into the skin 3 (three) times daily with meals.     [provider]  isosorbide mononitrate (IMDUR) 60 MG 24 hr tablet Take 1 tablet (60 mg total) by mouth daily. 03/05/17   Alisa Graff, FNP  loratadine (CLARITIN) 10 MG tablet Take 5 mg by mouth daily as needed for allergies.     [provider]  LORazepam (ATIVAN) 1 MG tablet Take 1 tablet (1 mg total) by mouth 2 (two) times daily as needed for anxiety. 12/29/16   Max Sane, MD  metoprolol tartrate (LOPRESSOR) 50 MG tablet Take 1 tablet (50 mg total) by mouth 2 (two) times daily. 03/31/17 06/29/17  End, Harrell Gave, MD  mirabegron ER (MYRBETRIQ) 25 MG TB24 tablet Take 25 mg by mouth daily.    [provider]  nitroGLYCERIN (NITROSTAT) 0.4 MG SL tablet Place 0.4 mg under the tongue every 5 (five) minutes as needed for chest pain.    [provider]  Omega-3 Fatty Acids (FISH OIL) 1000 MG CAPS Take 1,000 mg by mouth daily.    [provider]  polyethylene glycol (MIRALAX / GLYCOLAX) packet Take 17 g by mouth daily as needed for mild constipation.    [provider]  promethazine (PHENERGAN) 25 MG tablet Take 25 mg by mouth every 6 (six) hours as needed for nausea or vomiting.    [provider]  saccharomyces boulardii (FLORASTOR) 250 MG capsule Take 250 mg by mouth daily.    [provider]  traMADol (ULTRAM) 50 MG tablet Take 1 tablet (50 mg total) by mouth daily as needed. 12/29/16   Max Sane, MD   warfarin (COUMADIN) 2 MG tablet Take 1 tablet (2 mg total) by mouth daily. Take As Directed per [facility] 03/16/17   End, Harrell Gave, MD    Physical Exam: Vitals:   04/03/17 1836 04/03/17 1900 04/03/17 1945 04/03/17 2028  BP: (!) 133/93   (!) 157/116  Pulse: (!) 117 (!) 130 (!) 129 (!) 108  Resp: 20 (!) _0 Temp: 98.1 F (36.7 C)     TempSrc: Oral     SpO2: 97% 100% 99% 98%  Weight: 73 kg (161 lb)     Height: 5' 7.5" (1.715 m)       GENERAL: 76 y.o.-year-old Female patient, well-developed, well-nourished lying in the bed in no acute distress.  Pleasant and cooperative.   HEENT: Head atraumatic, normocephalic. Pupils equal, round, reactive to light and accommodation. No scleral icterus. Extraocular muscles intact. Nares are patent. Oropharynx is clear. Mucus membranes moist. NECK: Supple, full range of motion. No JVD, no bruit heard. No thyroid enlargement, no tenderness, no cervical lymphadenopathy. CHEST: Normal breath sounds bilaterally. No wheezing, rales, rhonchi or crackles. No use of accessory muscles of respiration.  No reproducible chest wall tenderness.  CARDIOVASCULAR: Irregular. S1, S2 normal. No murmurs, rubs, or gallops. Cap refill <2 seconds. Pulses intact distally.  ABDOMEN: Soft, nondistended, nontender. No rebound, guarding, rigidity. Normoactive bowel sounds present in all four quadrants. No organomegaly or mass. EXTREMITIES: Mild pedal edema. No calf tenderness or Homan's sign.  NEUROLOGIC: The patient is alert and oriented x 3. Cranial nerves II through XII are grossly intact with no focal sensorimotor deficit. Muscle strength 5/5 in all extremities. Sensation intact. Gait not checked. PSYCHIATRIC:  Normal affect, mood, thought content. SKIN: Warm, dry, and intact without obvious rash, lesion, or ulcer.    Labs on Admission:  CBC:  Recent Labs Lab 04/03/17 1918  WBC 4.6  HGB 9.6*  HCT 28.6*  MCV 85.9  PLT 151*   Basic Metabolic  Panel:  Recent Labs Lab 04/03/17 1918  NA 138  K 5.0  CL 108  CO2 23  GLUCOSE 158*  BUN 50*  CREATININE 2.53*  CALCIUM 8.4*  MG 2.0   GFR: Estimated Creatinine Clearance: 18.5 mL/min (A) (by C-G formula based on SCr of 2.53 mg/dL (H)). Liver Function Tests:  Recent Labs Lab 04/03/17 1918  AST 16  ALT <5*  ALKPHOS 66  BILITOT 0.6  PROT 6.1*  ALBUMIN 3.4*   No results for input(s): LIPASE, AMYLASE in the last 168 hours. No results for input(s): AMMONIA in the last 168 hours. Coagulation Profile:  Recent Labs Lab 03/31/17 1233 04/03/17 1918  INR 3.0 2.99   Cardiac  Enzymes:  Recent Labs Lab 04/03/17 1918  TROPONINI 0.03*   BNP (last 3 results) No results for input(s): PROBNP in the last 8760 hours. HbA1C: No results for input(s): HGBA1C in the last 72 hours. CBG: No results for input(s): GLUCAP in the last 168 hours. Lipid Profile: No results for input(s): CHOL, HDL, LDLCALC, TRIG, CHOLHDL, LDLDIRECT in the last 72 hours. Thyroid Function Tests: No results for input(s): TSH, T4TOTAL, FREET4, T3FREE, THYROIDAB in the last 72 hours. Anemia Panel: No results for input(s): VITAMINB12, FOLATE, FERRITIN, TIBC, IRON, RETICCTPCT in the last 72 hours. Urine analysis:    Component Value Date/Time   COLORURINE YELLOW (A) 04/03/2017 1920   APPEARANCEUR HAZY (A) 04/03/2017 1920   APPEARANCEUR Turbid (A) 12/08/2016 1440   LABSPEC 1.014 04/03/2017 1920   PHURINE 5.0 04/03/2017 1920   GLUCOSEU 50 (A) 04/03/2017 1920   HGBUR NEGATIVE 04/03/2017 1920   BILIRUBINUR NEGATIVE 04/03/2017 1920   BILIRUBINUR Negative 12/08/2016 1440   KETONESUR NEGATIVE 04/03/2017 1920   PROTEINUR 100 (A) 04/03/2017 1920   NITRITE NEGATIVE 04/03/2017 1920   LEUKOCYTESUR MODERATE (A) 04/03/2017 1920   LEUKOCYTESUR 1+ (A) 12/08/2016 1440   Sepsis Labs: _0 (procalcitonin:4,lacticidven:4) )No results found for this or any previous visit (from the past 240 hour(s)).    Radiological Exams on Admission: Dg Chest Portable 1 View  Result Date: 04/03/2017 CLINICAL DATA:  Cough and shortness of breath EXAM: PORTABLE CHEST 1 VIEW COMPARISON:  Chest radiograph 02/15/2017 FINDINGS: There is massive cardiomegaly. Differences from the prior study may be due to AP technique currently, which exaggerates the heart size. There is no pneumothorax or sizable pleural effusion. There is bibasilar atelectasis. No other consolidation. No pulmonary edema. IMPRESSION: Massive cardiomegaly and bibasilar atelectasis without pulmonary edema. Electronically Signed   By: Ulyses Jarred M.D.   On: 04/03/2017 19:38    EKG: Atrial fibrillation at 123 bpm with normal axis, left bundle branch block, prolonged QT. and nonspecific ST-T wave changes.   Assessment/Plan  This is a 76 y.o. female with a history of chronic combined systolic and diastolic heart failure, CKD4, pulmonary hypertension, CAD status post PCI, Parkinson's, diabetes, COPD, hypertension, hyperlipidemia, DVT on Coumadin, atrial fibrillation diagnosed 03/31/2017 now being admitted with:  #. Acute exacerbation of chronic combined systolic and diastolic heart failure - Telemetry monitoring. - Beta blocker, diuretic, nitrate.  - Intake/output, daily weight. - Trend troponins, check lipids and TSH. - Check echo - Cardiology consultation requested.  #. Atrial fibrillation with RVR - Rate control with metoprolol - INR therapeutic at 2.99 Will resume Coumadin asap.   #. Acute kidney injury on CKD4 - Avoid nephrotoxic medications - Bladder scan and place foley catheter if evidence of urinary retention  #. Anemia, ? UGIB (positive melena, guaiac negative stool) - Repeat stool FOBT - IV Protonix - NPO - Serial CBC and transfuse as needed - Hold anticoagulants - GI consultation  #. History of hypertension - Continue hydralazine, metoprolol, Lasix  #. History of Parkinson's - Continue Sinemet  #. History of  hyperlipidemia - Continue atorvastatin  #. History of depression - Continue Lexapro  #. History of neuropathy - Continue gabapentin  #. History of diabetes -Continue Lantus at half dose -Accu-Cheks every 4 hours with regular insulin sliding scale coverage -Nothing by mouth  Admission status: Inpatient, telemetry IV Fluids: Hep-Lock Diet/Nutrition: Nothing by mouth Consults called: Cardiology, GI  DVT Px: SCDs and early ambulation. Chemoprophylaxis will be contraindicated at this time secondary to active GI bleed. Code Status: Full  Code  Disposition Plan: To home in 2-3 days  All the records are reviewed and case discussed with ED provider. Management plans discussed with the patient and/or family who express understanding and agree with plan of care.  Merritt Kibby D.O. on 04/03/2017 at 9:34 PM Between 7am to 6pm - Pager - [phone] After 6pm go to [email or web address] - [employer]reader [facility] [place] [facility] Office [phone] CC: Primary care physician; Suann Larry, MD   04/03/2017, 9:34 PM

## 2017-04-03 NOTE — ED Notes (Signed)
Assisted Pt to the toilet.

## 2017-04-03 NOTE — ED Notes (Signed)
Assisted pt. To bathroom.  Pt. Uses walker to get around.

## 2017-04-04 ENCOUNTER — Inpatient Hospital Stay (HOSPITAL_COMMUNITY)
Admit: 2017-04-04 | Discharge: 2017-04-04 | Disposition: A | Discharge status: Home / Self Care | Payer: Medicare Other | Attending: Family Medicine | Admitting: Family Medicine

## 2017-04-04 DIAGNOSIS — I5023 Acute on chronic systolic (congestive) heart failure: Secondary | ICD-10-CM

## 2017-04-04 DIAGNOSIS — I4891 Unspecified atrial fibrillation: Secondary | ICD-10-CM

## 2017-04-04 DIAGNOSIS — I5041 Acute combined systolic (congestive) and diastolic (congestive) heart failure: Secondary | ICD-10-CM

## 2017-04-04 LAB — LIPID PANEL
CHOL/HDL RATIO: 2.6 ratio
Cholesterol: 98 mg/dL (ref 0–200)
HDL: 37 mg/dL — AB (ref >40)
LDL CALC: 49 mg/dL (ref 0–99)
Triglycerides: 60 mg/dL (ref <150)
VLDL: 12 mg/dL (ref 0–40)

## 2017-04-04 LAB — CBC
HCT: 28.3 % — ABNORMAL LOW (ref 35.0–47.0)
HEMATOCRIT: 28.1 % — AB (ref 35.0–47.0)
HEMATOCRIT: 29.5 % — AB (ref 35.0–47.0)
HEMATOCRIT: 29.5 % — AB (ref 35.0–47.0)
HEMOGLOBIN: 9.6 g/dL — AB (ref 12.0–16.0)
HEMOGLOBIN: 9.6 g/dL — AB (ref 12.0–16.0)
Hemoglobin: 10 g/dL — ABNORMAL LOW (ref 12.0–16.0)
Hemoglobin: 9.9 g/dL — ABNORMAL LOW (ref 12.0–16.0)
MCH: 28.8 pg (ref 26.0–34.0)
MCH: 28.8 pg (ref 26.0–34.0)
MCH: 28.9 pg (ref 26.0–34.0)
MCH: 29.2 pg (ref 26.0–34.0)
MCHC: 33.6 g/dL (ref 32.0–36.0)
MCHC: 33.8 g/dL (ref 32.0–36.0)
MCHC: 33.8 g/dL (ref 32.0–36.0)
MCHC: 34.3 g/dL (ref 32.0–36.0)
MCV: 84.1 fL (ref 80.0–100.0)
MCV: 85.6 fL (ref 80.0–100.0)
MCV: 85.7 fL (ref 80.0–100.0)
MCV: 86.2 fL (ref 80.0–100.0)
PLATELETS: 141 10*3/uL — AB (ref 150–440)
PLATELETS: 155 10*3/uL (ref 150–440)
Platelets: 171 10*3/uL (ref 150–440)
Platelets: 145 10*3/uL — ABNORMAL LOW (ref 150–440)
RBC: 3.28 MIL/uL — AB (ref 3.80–5.20)
RBC: 3.34 MIL/uL — AB (ref 3.80–5.20)
RBC: 3.44 MIL/uL — AB (ref 3.80–5.20)
RBC: 3.45 MIL/uL — ABNORMAL LOW (ref 3.80–5.20)
RDW: 15 % — AB (ref 11.5–14.5)
RDW: 15 % — ABNORMAL HIGH (ref 11.5–14.5)
RDW: 15 % — ABNORMAL HIGH (ref 11.5–14.5)
RDW: 15.3 % — ABNORMAL HIGH (ref 11.5–14.5)
WBC: 4.6 10*3/uL (ref 3.6–11.0)
WBC: 4.7 10*3/uL (ref 3.6–11.0)
WBC: 5 10*3/uL (ref 3.6–11.0)
WBC: 5.8 10*3/uL (ref 3.6–11.0)

## 2017-04-04 LAB — COMPREHENSIVE METABOLIC PANEL
ALBUMIN: 3.3 g/dL — AB (ref 3.5–5.0)
ALK PHOS: 91 U/L (ref 38–126)
ANION GAP: 9 (ref 5–15)
AST: 23 U/L (ref 15–41)
BILIRUBIN TOTAL: 0.7 mg/dL (ref 0.3–1.2)
BUN: 51 mg/dL — ABNORMAL HIGH (ref 6–20)
CALCIUM: 8.4 mg/dL — AB (ref 8.9–10.3)
CO2: 21 mmol/L — ABNORMAL LOW (ref 22–32)
CREATININE: 2.48 mg/dL — AB (ref 0.44–1.00)
Chloride: 110 mmol/L (ref 101–111)
GFR calc non Af Amer: 18 mL/min — ABNORMAL LOW (ref >60)
GFR, EST AFRICAN AMERICAN: 20 mL/min — AB (ref >60)
GLUCOSE: 72 mg/dL (ref 65–99)
Potassium: 4.4 mmol/L (ref 3.5–5.1)
Sodium: 140 mmol/L (ref 135–145)
TOTAL PROTEIN: 6 g/dL — AB (ref 6.5–8.1)

## 2017-04-04 LAB — TROPONIN I
TROPONIN I: 0.03 ng/mL — AB (ref <0.03)
TROPONIN I: 0.03 ng/mL — AB (ref <0.03)
Troponin I: 0.03 ng/mL (ref <0.03)

## 2017-04-04 LAB — GLUCOSE, CAPILLARY
GLUCOSE-CAPILLARY: 73 mg/dL (ref 65–99)
GLUCOSE-CAPILLARY: 102 mg/dL — AB (ref 65–99)
GLUCOSE-CAPILLARY: 67 mg/dL (ref 65–99)
GLUCOSE-CAPILLARY: 104 mg/dL — AB (ref 65–99)
Glucose-Capillary: 61 mg/dL — ABNORMAL LOW (ref 65–99)
Glucose-Capillary: 81 mg/dL (ref 65–99)
Glucose-Capillary: 53 mg/dL — ABNORMAL LOW (ref 65–99)
Glucose-Capillary: 85 mg/dL (ref 65–99)
Glucose-Capillary: 83 mg/dL (ref 65–99)
Glucose-Capillary: 71 mg/dL (ref 65–99)
Glucose-Capillary: 88 mg/dL (ref 65–99)

## 2017-04-04 LAB — ECHOCARDIOGRAM COMPLETE
Height: 67.5 in
Weight: 2584 oz

## 2017-04-04 LAB — TSH: TSH: 3.036 u[IU]/mL (ref 0.350–4.500)

## 2017-04-04 LAB — MAGNESIUM: Magnesium: 2.1 mg/dL (ref 1.7–2.4)

## 2017-04-04 MED ORDER — DEXTROSE 50 % IV SOLN: 1.0000 | Freq: Once | INTRAVENOUS | Status: DC

## 2017-04-04 MED ORDER — INSULIN ASPART 100 UNIT/ML ~~LOC~~ SOLN
0.0000 [IU] | Freq: Three times a day (TID) | SUBCUTANEOUS | Status: DC
  Administered 2017-04-05: 5 [IU] via SUBCUTANEOUS
  Administered 2017-04-06 (×2): 2 [IU] via SUBCUTANEOUS
  Administered 2017-04-06: 1 [IU] via SUBCUTANEOUS
  Administered 2017-04-07 (×2): 3 [IU] via SUBCUTANEOUS
  Administered 2017-04-08 (×2): 2 [IU] via SUBCUTANEOUS
  Administered 2017-04-08: 5 [IU] via SUBCUTANEOUS
  Administered 2017-04-09: 1 [IU] via SUBCUTANEOUS
  Filled 2017-04-04 (×10): qty 1

## 2017-04-04 MED ORDER — METOPROLOL TARTRATE 50 MG PO TABS
50.0000 mg | ORAL_TABLET | Freq: Two times a day (BID) | ORAL | Status: DC
  Administered 2017-04-04 – 2017-04-05 (×3): 50 mg via ORAL
  Filled 2017-04-04 (×3): qty 1

## 2017-04-04 MED ORDER — DEXTROSE-NACL 5-0.9 % IV SOLN
INTRAVENOUS | Status: DC
  Administered 2017-04-04: 06:00:00 via INTRAVENOUS

## 2017-04-04 MED ORDER — FUROSEMIDE 10 MG/ML IJ SOLN
40.0000 mg | Freq: Two times a day (BID) | INTRAMUSCULAR | Status: DC
  Administered 2017-04-04 – 2017-04-05 (×3): 40 mg via INTRAVENOUS
  Filled 2017-04-04 (×3): qty 4

## 2017-04-04 MED ORDER — PNEUMOCOCCAL VAC POLYVALENT 25 MCG/0.5ML IJ INJ
0.5000 mL | INJECTION | INTRAMUSCULAR | Status: AC
  Administered 2017-04-09: 0.5 mL via INTRAMUSCULAR
  Filled 2017-04-04: qty 0.5

## 2017-04-04 MED ORDER — INSULIN ASPART 100 UNIT/ML ~~LOC~~ SOLN
0.0000 [IU] | Freq: Every day | SUBCUTANEOUS | Status: DC
  Administered 2017-04-07: 2 [IU] via SUBCUTANEOUS
  Filled 2017-04-04: qty 1

## 2017-04-04 MED ORDER — DEXTROSE 50 % IV SOLN
INTRAVENOUS | Status: AC
  Administered 2017-04-04: 08:00:00
  Filled 2017-04-04: qty 50

## 2017-04-04 NOTE — Progress Notes (Signed)
CBG= 53 at 0740, Dr. Elisabeth PigeonVachhani notified with a new order to give 1 ampule of D50 and recheck the CBG in 1 hour. Patient was asymptomatic, Will continue to monitor.

## 2017-04-04 NOTE — Progress Notes (Addendum)
Patient's CBG=67 at 2016 and was treated with orange juice. Rechecked CBG=88. Patient was asymptomatic. Dr. Emmit PomfretHugelmeyer informed with a new order for sensitive sliding scale and she said the RN should discontinue the every 2 hour CBG checks.  MD agreed to let the RN hold the 20 units of Lantus. Patient is resting comfotably at this time with her eyes closed, respirations even and unlabored. Will continue to monitor.

## 2017-04-04 NOTE — Progress Notes (Signed)
Patient is admitted to room 237 with the diagnosis of  acute combine systolic and diastolic CHF. Alert and oriented x 4. Denied any pain at this time. No respiratory distress noted. Tele box called to CCMD by Crystal RN and Daniell NT. Skin assessment done with Crystal RN, noted abrasion on left lower leg, redness on bilateral groins and redness on buttocks. Safety risk explained and patient signed the contract. Will continue to monitor.

## 2017-04-04 NOTE — Progress Notes (Addendum)
[facility] [place] at [facility]   PATIENT NAME: Molly CunasJudy Pflieger    MR#:  [id number]  DATE OF BIRTH:  02-08-1941  SUBJECTIVE:  CHIEF COMPLAINT:   Chief Complaint  Patient presents with  . Weakness  . Melena     Has history of heart failure and A. fib and on Coumadin, for last few days and feeling some tightness in her chest and dizziness and shortness of breath. Also noted some dark stool for last 2-3 days.  REVIEW OF SYSTEMS:  CONSTITUTIONAL: No fever, fatigue or weakness.  EYES: No blurred or double vision.  EARS, NOSE, AND THROAT: No tinnitus or ear pain.  RESPIRATORY: No cough, shortness of breath, wheezing or hemoptysis.  CARDIOVASCULAR: No chest pain, orthopnea, edema.  GASTROINTESTINAL: No nausea, vomiting, diarrhea or abdominal pain.  GENITOURINARY: No dysuria, hematuria.  ENDOCRINE: No polyuria, nocturia,  HEMATOLOGY: No anemia, easy bruising or bleeding SKIN: No rash or lesion. MUSCULOSKELETAL: No joint pain or arthritis.   NEUROLOGIC: No tingling, numbness, weakness.  PSYCHIATRY: No anxiety or depression.   ROS  DRUG ALLERGIES:  No Known Allergies  VITALS:  Blood pressure 121/84, pulse (!) 183, temperature 98 F (36.7 C), temperature source Oral, resp. rate 20, height 5' 7.5" (1.715 m), weight 73.3 kg (161 lb 8 oz), SpO2 99 %.  PHYSICAL EXAMINATION:  GENERAL:  76 y.o.-year-old patient lying in the bed with no acute distress.  EYES: Pupils equal, round, reactive to light and accommodation. No scleral icterus. Extraocular muscles intact.  HEENT: Head atraumatic, normocephalic. Oropharynx and nasopharynx clear.  NECK:  Supple, no jugular venous distention. No thyroid enlargement, no tenderness.  LUNGS: Normal breath sounds bilaterally, no wheezing, some crepitation. No use of accessory muscles of respiration.  CARDIOVASCULAR: irregular S1, S2 normal. No murmurs .  ABDOMEN: Soft, nontender, nondistended. Bowel sounds present. No organomegaly or  mass.  EXTREMITIES: No pedal edema, cyanosis, or clubbing.  NEUROLOGIC: Cranial nerves II through XII are intact. Muscle strength 5/5 in all extremities. Sensation intact. Gait not checked.  PSYCHIATRIC: The patient is alert and oriented x 3.  SKIN: No obvious rash, lesion, or ulcer.   Physical Exam LABORATORY PANEL:   CBC  Recent Labs Lab 04/04/17 1123  WBC 4.7  HGB 9.6*  HCT 28.1*  PLT 145*   ------------------------------------------------------------------------------------------------------------------  Chemistries   Recent Labs Lab 04/04/17 0022 04/04/17 0531  NA  --  140  K  --  4.4  CL  --  110  CO2  --  21*  GLUCOSE  --  72  BUN  --  51*  CREATININE  --  2.48*  CALCIUM  --  8.4*  MG 2.1  --   AST  --  23  ALT  --  <5*  ALKPHOS  --  91  BILITOT  --  0.7   ------------------------------------------------------------------------------------------------------------------  Cardiac Enzymes  Recent Labs Lab 04/04/17 0531 04/04/17 1123  TROPONINI 0.03* 0.03*   ------------------------------------------------------------------------------------------------------------------  RADIOLOGY:  Dg Chest Portable 1 View  Result Date: 04/03/2017 CLINICAL DATA:  Cough and shortness of breath EXAM: PORTABLE CHEST 1 VIEW COMPARISON:  Chest radiograph 02/15/2017 FINDINGS: There is massive cardiomegaly. Differences from the prior study may be due to AP technique currently, which exaggerates the heart size. There is no pneumothorax or sizable pleural effusion. There is bibasilar atelectasis. No other consolidation. No pulmonary edema. IMPRESSION: Massive cardiomegaly and bibasilar atelectasis without pulmonary edema. Electronically Signed   By: Deatra RobinsonKevin  Herman M.D.   On: 04/03/2017 19:38  ASSESSMENT AND PLAN:   Active Problems:   Acute combined systolic (congestive) and diastolic (congestive) heart failure (HCC)  This is a 76 y.o. female with a history of chronic  combined systolic and diastolic heart failure, CKD4, pulmonary hypertension, CAD status post PCI, Parkinson's, diabetes, COPD, hypertension, hyperlipidemia, DVT on Coumadin, atrial fibrillation diagnosed 03/31/2017 now being admitted with:  #. Acute exacerbation of chronic combined systolic and diastolic heart failure - Continue Telemetry monitoring. - Beta blocker, diuretic, nitrate.  - Intake/output, daily weight. - Trend troponins, check lipids and TSH. - recent Echo April 2018, repeated one today. - Cardiology consultation appreciated.  #. Atrial fibrillation with RVR - Rate control with metoprolol, increase the doses still was tachycardic. - INR therapeutic at 2.99 Will hold Coumadin for now due to GI bleed.  #. Acute kidney injury on CKD4 - Avoid nephrotoxic medications - Monitor with IV Lasix.  #. Anemia, ? UGIB (positive melena, guaiac negative stool) - Repeat stool FOBT - IV Protonix - Serial CBC and transfuse as needed- hemoglobin stable. - Hold anticoagulants - GI consultation appreciated plan for EGD once INR is normal.  #. History of hypertension - Continue hydralazine, metoprolol, Lasix  #. History of Parkinson's - Continue Sinemet  #. History of hyperlipidemia - Continue atorvastatin  #. History of depression - Continue Lexapro  #. History of neuropathy - Continue gabapentin  #. History of diabetes -Continue Lantus at half dose -Accu-Cheks every 4 hours with regular insulin sliding scale coverage -Nothing by mouth  # Hypoglycemia    As she was kept NPO- had hypoglycemia, was on IV dextrose.    Now sugar stable,and resume diet as no plan from GI for procedure today.  All the records are reviewed and case discussed with Care Management/Social Workerr. Management plans discussed with the patient, family and they are in agreement.  CODE STATUS: full.  TOTAL TIME TAKING CARE OF THIS PATIENT: 35 minutes.    POSSIBLE D/C IN 2-3 DAYS,  DEPENDING ON CLINICAL CONDITION.   Altamese Dilling M.D on 04/04/2017   Between 7am to 6pm - Pager - [phone]  After 6pm go to [email or web address] - password [email or web address]  [facility] [place] [facility]  Office  [phone]  CC: Primary care physician; Darliss Ridgel, MD  Note: This dictation was prepared with Dragon dictation along with smaller phrase technology. Any transcriptional errors that result from this process are unintentional.

## 2017-04-04 NOTE — Consult Note (Addendum)
Primary Physician: Primary Cardiologist:  End  Asked by Dr Ara Kussmaul to see for CHFexscerbation  and atrial fib  HPI:  Pt is a 76 yo with hstory of CHF, CAD (s/p remote PCI at [facility]), COPD, CKD Parkinson's, HL, HTN, DVT, atrial fibrillation  She presented to ED with weakness, SOB, orthopnea, dizziness.  The pt was hospitalized in April for patellar Fx  Hosp was complicated by CHF   Seen by Cardiology.   She was admitted again in May 2018 wth CHF exacerbatoin She was just seen by C End on 6/20  During clinic visit noted chronic SOB and  orthopnea with no real change  Baseline  She was noted to be in atral fib at time with higher HR  So her metoprolol was increased and hydralazine decreased  Pt's volume status appeared OK  At that visit.    Pt says that over past few days, since shewas seen in clinic, her breathing got worse  Couldn't walk with walker without getting dizzy  Gave out  No syncope   Doesn't sleep well (baseline) Did notice heart rate racing more the past few days DId have some chest tightness before coming into the hospital None now     Pt also reported dark tarry stools for 2 days  This is new for her  Appetite is chronically down  She says before her knee problems she had been feeling better overall  More energy  LIves with grandson and his girlfriend  Diet is not high in salt Does say that with current home dose of lasix (20 mg ) she does not urinate more.     Echo in April 2018 mild LVH  LVEF 45 to 50% with akinesis of the base and mid inferolateral and inferior wall.  Mild to mod AS   Mild AI   Mild to mod MR    RV mildly dilated with mildly reduced function       Past Medical History:  Diagnosis Date  . Anxiety   . Arthritis   . Chronic combined systolic and diastolic CHF (congestive heart failure) ([place])    a. 01/2008 Echo ([facility]): EF > 55%, mod-sev LVH w/ grade 1 DD, biatrial enlargement, mild AS, trace MR/TR; b. EF 45-50%, GR2DD, mild to moderate aortic  stenosis, mild aortic insufficieny, mild to moderate mitral regurgitation, mild tricuspid regurgitation, mild biatrial enlargement, and a small pericardial effusion  . CKD (chronic kidney disease), stage IV ([place])   . Closed patellar sleeve fracture of right knee    a. 01/2017 - conservatively managed.  Marland Kitchen COPD (chronic obstructive pulmonary disease) ([place])   . Coronary artery disease    a. s/p remote stenting of unknown vessel @ [facility].  . Depression   . Diabetes mellitus with complication ([place])   . DVT (deep venous thrombosis) ([facility])    a. in the setting of right patellar fracture on Coumadin  . Hyperlipidemia   . Hypertension   . Parkinson disease ([place])     Medications Prior to Admission  Medication Sig Dispense Refill  . acetaminophen (TYLENOL) 325 MG tablet Take 650 mg by mouth every 6 (six) hours as needed for mild pain.    Marland Kitchen atorvastatin (LIPITOR) 20 MG tablet Take 20 mg by mouth daily.    . blood glucose meter kit and supplies KIT Dispense based on patient and insurance preference. Use up to four times daily as directed. (FOR ICD-9 250.00, 250.01). 1 each 0  . Carbidopa-Levodopa ER (SINEMET CR)  25-100 MG tablet controlled release Take 2 tablets by mouth 4 (four) times daily. 240 tablet 1  . Coenzyme Q10 (COQ10) 50 MG CAPS Take 50 mg by mouth daily.    Marland Kitchen CRANBERRY PO Take 1 tablet by mouth daily.    Marland Kitchen escitalopram (LEXAPRO) 20 MG tablet Take 20 mg by mouth daily.    . furosemide (LASIX) 20 MG tablet Take 1 tablet (20 mg total) by mouth daily. 30 tablet 2  . gabapentin (NEURONTIN) 300 MG capsule Take 300 mg by mouth daily.     Marland Kitchen glucose blood (ACCU-CHEK AVIVA PLUS) test strip Use as instructed 100 each 12  . hydrALAZINE (APRESOLINE) 25 MG tablet Take 1 tablet (25 mg total) by mouth 2 (two) times daily. 180 tablet 3  . insulin glargine (LANTUS) 100 UNIT/ML injection Inject 40 Units into the skin at bedtime.     . insulin lispro (HUMALOG) 100 UNIT/ML injection Inject 6 Units into the skin  3 (three) times daily with meals.     . isosorbide mononitrate (IMDUR) 60 MG 24 hr tablet Take 1 tablet (60 mg total) by mouth daily. 30 tablet 2  . loratadine (CLARITIN) 10 MG tablet Take 5 mg by mouth daily as needed for allergies.     Marland Kitchen LORazepam (ATIVAN) 1 MG tablet Take 1 tablet (1 mg total) by mouth 2 (two) times daily as needed for anxiety. 5 tablet 0  . metoprolol tartrate (LOPRESSOR) 50 MG tablet Take 1 tablet (50 mg total) by mouth 2 (two) times daily. 180 tablet 3  . mirabegron ER (MYRBETRIQ) 25 MG TB24 tablet Take 25 mg by mouth daily.    . nitroGLYCERIN (NITROSTAT) 0.4 MG SL tablet Place 0.4 mg under the tongue every 5 (five) minutes as needed for chest pain.    . Omega-3 Fatty Acids (FISH OIL) 1000 MG CAPS Take 1,000 mg by mouth daily.    . polyethylene glycol (MIRALAX / GLYCOLAX) packet Take 17 g by mouth daily as needed for mild constipation.    . promethazine (PHENERGAN) 25 MG tablet Take 25 mg by mouth every 6 (six) hours as needed for nausea or vomiting.    . saccharomyces boulardii (FLORASTOR) 250 MG capsule Take 250 mg by mouth daily.    . traMADol (ULTRAM) 50 MG tablet Take 1 tablet (50 mg total) by mouth daily as needed. 5 tablet 0  . warfarin (COUMADIN) 3 MG tablet Take 3 mg by mouth daily.       Marland Kitchen atorvastatin  20 mg Oral Daily  . Carbidopa-Levodopa ER  2 tablet Oral QID  . dextrose  1 ampule Intravenous Once  . escitalopram  20 mg Oral Daily  . furosemide  20 mg Oral Daily  . gabapentin  300 mg Oral Daily  . hydrALAZINE  25 mg Oral BID  . insulin aspart  0-15 Units Subcutaneous Q4H  . insulin glargine  20 Units Subcutaneous QHS  . isosorbide mononitrate  60 mg Oral Daily  . metoprolol tartrate  50 mg Oral BID  . mirabegron ER  25 mg Oral Daily  . [START ON 04/07/2017] pantoprazole  40 mg Intravenous Q12H  . [START ON 04/05/2017] pneumococcal 23 valent vaccine  0.5 mL Intramuscular Tomorrow-1000  . saccharomyces boulardii  250 mg Oral Daily  . sodium chloride  flush  3 mL Intravenous Q12H    Infusions: . sodium chloride    . dextrose 5 % and 0.9% NaCl 75 mL/hr at 04/04/17 0629  . pantoprozole (PROTONIX) infusion  8 mg/hr (04/04/17 0057)    No Known Allergies  Social History   Social History  . Marital status: Single    Spouse name: N/A  . Number of children: N/A  . Years of education: N/A   Occupational History  . Not on file.   Social History Main Topics  . Smoking status: Never Smoker  . Smokeless tobacco: Never Used  . Alcohol use No  . Drug use: No  . Sexual activity: No   Other Topics Concern  . Not on file   Social History Narrative  . No narrative on file    Family History  Problem Relation Age of Onset  . Cervical cancer Mother   . Kidney failure Father   . CAD Father   . CAD Brother   . Prostate cancer Neg Hx   . Kidney cancer Neg Hx   . Bladder Cancer Neg Hx     REVIEW OF SYSTEMS:  All systems reviewed  Negative to the above problem except as noted above.    PHYSICAL EXAM: Vitals:   04/03/17 2334 04/04/17 0435  BP: (!) 135/93 (!) 134/94  Pulse: (!) 101 (!) 113  Resp: 20 18  Temp: 97.4 F (36.3 C) 98.1 F (36.7 C)     Intake/Output Summary (Last 24 hours) at 04/04/17 0951 Last data filed at 04/04/17 0442  Gross per 24 hour  Intake                0 ml  Output              200 ml  Net             -200 ml    General:  Molly Wolf 76 yo in NAD  HEENT: normal Neck: supple.  JVP is increased  . Carotids 2+ bilat; no bruits. No lymphadenopathy or thryomegaly appreciated. Cor: PMI nondisplaced. Irregular rate & rhythm. No rubs, gallops Gr II/VI systolic murmur Lungs: Rales at bases   Abdomen: soft, nontender, nondistended. No hepatosplenomegaly. No bruits or masses. Good bowel sounds. Extremities: no cyanosis, clubbing, rash,  No sigif edema Neuro: alert & oriented x 3, cranial nerves grossly intact. moves all 4 extremities w/o difficulty. Affect pleasant.   Rest tremors present    ECG:  6/23  Atrial  fibrillation  123 bpm  Sl ST depression lateral leads  Low voltage QRS    Results for orders placed or performed during the hospital encounter of 04/03/17 (from the past 24 hour(s))  CBC     Status: Abnormal   Collection Time: 04/03/17  7:18 PM  Result Value Ref Range   WBC 4.6 3.6 - 11.0 K/uL   RBC 3.33 (L) 3.80 - 5.20 MIL/uL   Hemoglobin 9.6 (L) 12.0 - 16.0 g/dL   HCT 28.6 (L) 35.0 - 47.0 %   MCV 85.9 80.0 - 100.0 fL   MCH 29.0 26.0 - 34.0 pg   MCHC 33.7 32.0 - 36.0 g/dL   RDW 15.0 (H) 11.5 - 14.5 %   Platelets 146 (L) 150 - 440 K/uL  Troponin I     Status: Abnormal   Collection Time: 04/03/17  7:18 PM  Result Value Ref Range   Troponin I 0.03 (HH) <0.03 ng/mL  Protime-INR (order if Patient is taking Coumadin / Warfarin)     Status: Abnormal   Collection Time: 04/03/17  7:18 PM  Result Value Ref Range   Prothrombin Time 31.7 (H) 11.4 - 15.2 seconds   INR 2.99  Comprehensive metabolic panel     Status: Abnormal   Collection Time: 04/03/17  7:18 PM  Result Value Ref Range   Sodium 138 135 - 145 mmol/L   Potassium 5.0 3.5 - 5.1 mmol/L   Chloride 108 101 - 111 mmol/L   CO2 23 22 - 32 mmol/L   Glucose, Bld 158 (H) 65 - 99 mg/dL   BUN 50 (H) 6 - 20 mg/dL   Creatinine, Ser 2.53 (H) 0.44 - 1.00 mg/dL   Calcium 8.4 (L) 8.9 - 10.3 mg/dL   Total Protein 6.1 (L) 6.5 - 8.1 g/dL   Albumin 3.4 (L) 3.5 - 5.0 g/dL   AST 16 15 - 41 U/L   ALT <5 (L) 14 - 54 U/L   Alkaline Phosphatase 66 38 - 126 U/L   Total Bilirubin 0.6 0.3 - 1.2 mg/dL   GFR calc non Af Amer 17 (L) >60 mL/min   GFR calc Af Amer 20 (L) >60 mL/min   Anion gap 7 5 - 15  Type and screen [facility]     Status: None (Preliminary result)   Collection Time: 04/03/17  7:18 PM  Result Value Ref Range   ABO/RH(D) PENDING    Antibody Screen PENDING    Sample Expiration 04/06/2017   Magnesium     Status: None   Collection Time: 04/03/17  7:18 PM  Result Value Ref Range   Magnesium 2.0 1.7 - 2.4 mg/dL    Brain natriuretic peptide     Status: Abnormal   Collection Time: 04/03/17  7:18 PM  Result Value Ref Range   B Natriuretic Peptide 1,139.0 (H) 0.0 - 100.0 pg/mL  Urinalysis, Complete w Microscopic     Status: Abnormal   Collection Time: 04/03/17  7:20 PM  Result Value Ref Range   Color, Urine YELLOW (A) YELLOW   APPearance HAZY (A) CLEAR   Specific Gravity, Urine 1.014 1.005 - 1.030   pH 5.0 5.0 - 8.0   Glucose, UA 50 (A) NEGATIVE mg/dL   Hgb urine dipstick NEGATIVE NEGATIVE   Bilirubin Urine NEGATIVE NEGATIVE   Ketones, ur NEGATIVE NEGATIVE mg/dL   Protein, ur 100 (A) NEGATIVE mg/dL   Nitrite NEGATIVE NEGATIVE   Leukocytes, UA MODERATE (A) NEGATIVE   RBC / HPF 0-5 0 - 5 RBC/hpf   WBC, UA 6-30 0 - 5 WBC/hpf   Bacteria, UA RARE (A) NONE SEEN   Squamous Epithelial / LPF 0-5 (A) NONE SEEN   Mucous PRESENT    Hyaline Casts, UA PRESENT   Troponin I     Status: Abnormal   Collection Time: 04/04/17 12:22 AM  Result Value Ref Range   Troponin I 0.03 (HH) <0.03 ng/mL  TSH     Status: None   Collection Time: 04/04/17 12:22 AM  Result Value Ref Range   TSH 3.036 0.350 - 4.500 uIU/mL  Magnesium     Status: None   Collection Time: 04/04/17 12:22 AM  Result Value Ref Range   Magnesium 2.1 1.7 - 2.4 mg/dL  CBC     Status: Abnormal   Collection Time: 04/04/17 12:22 AM  Result Value Ref Range   WBC 5.0 3.6 - 11.0 K/uL   RBC 3.45 (L) 3.80 - 5.20 MIL/uL   Hemoglobin 10.0 (L) 12.0 - 16.0 g/dL   HCT 29.5 (L) 35.0 - 47.0 %   MCV 85.6 80.0 - 100.0 fL   MCH 28.9 26.0 - 34.0 pg   MCHC 33.8 32.0 - 36.0 g/dL  RDW 15.0 (H) 11.5 - 14.5 %   Platelets 155 150 - 440 K/uL  Glucose, capillary     Status: Abnormal   Collection Time: 04/04/17  4:40 AM  Result Value Ref Range   Glucose-Capillary 61 (L) 65 - 99 mg/dL  Glucose, capillary     Status: None   Collection Time: 04/04/17  5:17 AM  Result Value Ref Range   Glucose-Capillary 81 65 - 99 mg/dL  Troponin I     Status: Abnormal    Collection Time: 04/04/17  5:31 AM  Result Value Ref Range   Troponin I 0.03 (HH) <0.03 ng/mL  Lipid panel     Status: Abnormal   Collection Time: 04/04/17  5:31 AM  Result Value Ref Range   Cholesterol 98 0 - 200 mg/dL   Triglycerides 60 <150 mg/dL   HDL 37 (L) >40 mg/dL   Total CHOL/HDL Ratio 2.6 RATIO   VLDL 12 0 - 40 mg/dL   LDL Cholesterol 49 0 - 99 mg/dL  CBC     Status: Abnormal   Collection Time: 04/04/17  5:31 AM  Result Value Ref Range   WBC 4.6 3.6 - 11.0 K/uL   RBC 3.28 (L) 3.80 - 5.20 MIL/uL   Hemoglobin 9.6 (L) 12.0 - 16.0 g/dL   HCT 28.3 (L) 35.0 - 47.0 %   MCV 86.2 80.0 - 100.0 fL   MCH 29.2 26.0 - 34.0 pg   MCHC 33.8 32.0 - 36.0 g/dL   RDW 15.0 (H) 11.5 - 14.5 %   Platelets 141 (L) 150 - 440 K/uL  Comprehensive metabolic panel     Status: Abnormal   Collection Time: 04/04/17  5:31 AM  Result Value Ref Range   Sodium 140 135 - 145 mmol/L   Potassium 4.4 3.5 - 5.1 mmol/L   Chloride 110 101 - 111 mmol/L   CO2 21 (L) 22 - 32 mmol/L   Glucose, Bld 72 65 - 99 mg/dL   BUN 51 (H) 6 - 20 mg/dL   Creatinine, Ser 2.48 (H) 0.44 - 1.00 mg/dL   Calcium 8.4 (L) 8.9 - 10.3 mg/dL   Total Protein 6.0 (L) 6.5 - 8.1 g/dL   Albumin 3.3 (L) 3.5 - 5.0 g/dL   AST 23 15 - 41 U/L   ALT <5 (L) 14 - 54 U/L   Alkaline Phosphatase 91 38 - 126 U/L   Total Bilirubin 0.7 0.3 - 1.2 mg/dL   GFR calc non Af Amer 18 (L) >60 mL/min   GFR calc Af Amer 20 (L) >60 mL/min   Anion gap 9 5 - 15  CBC     Status: Abnormal   Collection Time: 04/04/17  7:31 AM  Result Value Ref Range   WBC 5.8 3.6 - 11.0 K/uL   RBC 3.44 (L) 3.80 - 5.20 MIL/uL   Hemoglobin 9.9 (L) 12.0 - 16.0 g/dL   HCT 29.5 (L) 35.0 - 47.0 %   MCV 85.7 80.0 - 100.0 fL   MCH 28.8 26.0 - 34.0 pg   MCHC 33.6 32.0 - 36.0 g/dL   RDW 15.3 (H) 11.5 - 14.5 %   Platelets 171 150 - 440 K/uL  Glucose, capillary     Status: Abnormal   Collection Time: 04/04/17  7:40 AM  Result Value Ref Range   Glucose-Capillary 53 (L) 65 - 99 mg/dL   Glucose, capillary     Status: None   Collection Time: 04/04/17  9:29 AM  Result Value Ref Range  Glucose-Capillary 85 65 - 99 mg/dL   Dg Chest Portable 1 View  Result Date: 04/03/2017 CLINICAL DATA:  Cough and shortness of breath EXAM: PORTABLE CHEST 1 VIEW COMPARISON:  Chest radiograph 02/15/2017 FINDINGS: There is massive cardiomegaly. Differences from the prior study may be due to AP technique currently, which exaggerates the heart size. There is no pneumothorax or sizable pleural effusion. There is bibasilar atelectasis. No other consolidation. No pulmonary edema. IMPRESSION: Massive cardiomegaly and bibasilar atelectasis without pulmonary edema. Electronically Signed   By: Ulyses Jarred M.D.   On: 04/03/2017 19:38     ASSESSMENT: Pt is a 76 yo with known CAD, mild/mod AS, atrial fibrillation and systolic /diastolic CHF  Presents with SOB, giving out, dizzy  Also with reported melena    On arrival she was in afib with RVR  Rates have improved some now Hg 9.9  BNP 1139   Cr 2.53 (near baseline)  Difficult to say what triggered acute decompensation  (? Increased HR)  Pt was weak-- need to follow BP closely   1 Acute on chronic systolic / diastolic CHF    Pt with evid of some increased volume on exam  (elevated JVP, rales)  I would recomm rate control with metoprolol  Follow on telemetry  WOuld probably hold hydralazine and push b blocker dose  This may be exacerbating above presentation Would give IV lasix  Stop IV fluids    Follow I/O and renal function closely  Note echo pending  I am not sure how much will add   She just had one in April 2018    Agree with IV PPI FOllow H/H  Closely  Findings here will dictate coumadin safety  Hold coumadin today    2.  Atrial fib  Increase metorpolol and follow on tele  Will be improtant to follow with ambulation when she  Is closer to d/c  Follow BP as well  3  CKD  Stage IV   Follow response   Lasix 20 with a Cr of 2.53 is prob not  adequate as outpt  4   ? Melena  Follow H/H  Hold coumadin today    Again, decision for long term safety will need to be made    5  Hx CAD  I am not convinced of active ischemia  6  HL   ON lipitor.

## 2017-04-04 NOTE — Progress Notes (Addendum)
CBG =61 at 0400. Patient was asymptomatic and talkative. Patient was offered coke and on rechecked it was 81 mg/dL. Dr. Tobi BastosPyreddy notified that patient was given something to drink to bring the blood sugar up. He said the patient should still  be kept NPO till the rounding MD sees her. MD ordered to start  5% plus NS at 75 mL/hr and check CBG every 2 hours  for the next 12 hours. Will continue to monitor.

## 2017-04-04 NOTE — Consult Note (Signed)
Consultation  Referring Provider:      Primary Care Physician:  Suann Larry, MD Primary Gastroenterologist:         Reason for Consultation:    Melena      Impression / Plan:  Probable UGI bleed on coumadin. Will need EGD when PT normalizes prior to discharge to evaluate source of bleeding.  Continue to hold coumadin. Continue Protonix          HPI:   Molly Wolf is a 76 y.o. female reports melena on coumadin. HGB 9.6. Patient on a regular diet at this time.  Past Medical History:  Diagnosis Date  . Anxiety   . Arthritis   . Chronic combined systolic and diastolic CHF (congestive heart failure) ([place])    a. 01/2008 Echo ([facility]): EF > 55%, mod-sev LVH w/ grade 1 DD, biatrial enlargement, mild AS, trace MR/TR; b. EF 45-50%, GR2DD, mild to moderate aortic stenosis, mild aortic insufficieny, mild to moderate mitral regurgitation, mild tricuspid regurgitation, mild biatrial enlargement, and a small pericardial effusion  . CKD (chronic kidney disease), stage IV ([place])   . Closed patellar sleeve fracture of right knee    a. 01/2017 - conservatively managed.  Marland Kitchen COPD (chronic obstructive pulmonary disease) ([place])   . Coronary artery disease    a. s/p remote stenting of unknown vessel @ [facility].  . Depression   . Diabetes mellitus with complication ([place])   . DVT (deep venous thrombosis) ([facility])    a. in the setting of right patellar fracture on Coumadin  . Hyperlipidemia   . Hypertension   . Parkinson disease [facility])     Past Surgical History:  Procedure Laterality Date  . BACK SURGERY    . CORONARY ANGIOPLASTY WITH STENT PLACEMENT    . NECK SURGERY      Family History  Problem Relation Age of Onset  . Cervical cancer Mother   . Kidney failure Father   . CAD Father   . CAD Brother   . Prostate cancer Neg Hx   . Kidney cancer Neg Hx   . Bladder Cancer Neg Hx      Social History  Substance Use Topics  . Smoking status: Never Smoker  . Smokeless tobacco: Never Used  .  Alcohol use No    Prior to Admission medications   Medication Sig Start Date End Date Taking? Authorizing Provider  acetaminophen (TYLENOL) 325 MG tablet Take 650 mg by mouth every 6 (six) hours as needed for mild pain.   Yes [provider]  atorvastatin (LIPITOR) 20 MG tablet Take 20 mg by mouth daily.   Yes [provider]  blood glucose meter kit and supplies KIT Dispense based on patient and insurance preference. Use up to four times daily as directed. (FOR ICD-9 250.00, 250.01). 11/06/16  Yes Alfred Levins, [place], MD  Carbidopa-Levodopa ER (SINEMET CR) 25-100 MG tablet controlled release Take 2 tablets by mouth 4 (four) times daily. 03/05/17  Yes Hackney, Tina A, FNP  Coenzyme Q10 (COQ10) 50 MG CAPS Take 50 mg by mouth daily.   Yes [provider]  CRANBERRY PO Take 1 tablet by mouth daily.   Yes [provider]  escitalopram (LEXAPRO) 20 MG tablet Take 20 mg by mouth daily.   Yes [provider]  furosemide (LASIX) 20 MG tablet Take 1 tablet (20 mg total) by mouth daily. 03/05/17  Yes Hackney, Otila Kluver A, FNP  gabapentin (NEURONTIN) 300 MG capsule Take 300 mg by mouth daily.  Yes [provider]  glucose blood (ACCU-CHEK AVIVA PLUS) test strip Use as instructed 02/25/17  Yes Darylene Price A, FNP  hydrALAZINE (APRESOLINE) 25 MG tablet Take 1 tablet (25 mg total) by mouth 2 (two) times daily. 03/31/17 06/29/17 Yes End, Harrell Gave, MD  insulin glargine (LANTUS) 100 UNIT/ML injection Inject 40 Units into the skin at bedtime.    Yes [provider]  insulin lispro (HUMALOG) 100 UNIT/ML injection Inject 6 Units into the skin 3 (three) times daily with meals.    Yes [provider]  isosorbide mononitrate (IMDUR) 60 MG 24 hr tablet Take 1 tablet (60 mg total) by mouth daily. 03/05/17  Yes Hackney, Otila Kluver A, FNP  loratadine (CLARITIN) 10 MG tablet Take 5 mg by mouth daily as needed for allergies.    Yes [provider]  LORazepam  (ATIVAN) 1 MG tablet Take 1 tablet (1 mg total) by mouth 2 (two) times daily as needed for anxiety. 12/29/16  Yes Max Sane, MD  metoprolol tartrate (LOPRESSOR) 50 MG tablet Take 1 tablet (50 mg total) by mouth 2 (two) times daily. 03/31/17 06/29/17 Yes End, Harrell Gave, MD  mirabegron ER (MYRBETRIQ) 25 MG TB24 tablet Take 25 mg by mouth daily.   Yes [provider]  nitroGLYCERIN (NITROSTAT) 0.4 MG SL tablet Place 0.4 mg under the tongue every 5 (five) minutes as needed for chest pain.   Yes [provider]  Omega-3 Fatty Acids (FISH OIL) 1000 MG CAPS Take 1,000 mg by mouth daily.   Yes [provider]  polyethylene glycol (MIRALAX / GLYCOLAX) packet Take 17 g by mouth daily as needed for mild constipation.   Yes [provider]  promethazine (PHENERGAN) 25 MG tablet Take 25 mg by mouth every 6 (six) hours as needed for nausea or vomiting.   Yes [provider]  saccharomyces boulardii (FLORASTOR) 250 MG capsule Take 250 mg by mouth daily.   Yes [provider]  traMADol (ULTRAM) 50 MG tablet Take 1 tablet (50 mg total) by mouth daily as needed. 12/29/16  Yes Max Sane, MD  warfarin (COUMADIN) 3 MG tablet Take 3 mg by mouth daily.   Yes [provider]    Current Facility-Administered Medications  Medication Dose Route Frequency Provider Last Rate Last Dose  . acetaminophen (TYLENOL) tablet 650 mg  650 mg Oral Q6H PRN Hugelmeyer, Alexis, DO      . atorvastatin (LIPITOR) tablet 20 mg  20 mg Oral Daily Hugelmeyer, Alexis, DO      . Carbidopa-Levodopa ER (SINEMET CR) 25-100 MG tablet controlled release 2 tablet  2 tablet Oral QID Hugelmeyer, Alexis, DO   2 tablet at 04/04/17 1044  . dextrose 50 % solution 50 mL  1 ampule Intravenous Once Vaughan Basta, MD      . escitalopram (LEXAPRO) tablet 20 mg  20 mg Oral Daily Hugelmeyer, Alexis, DO   20 mg at 04/04/17 1045  . gabapentin (NEURONTIN) capsule 300 mg  300 mg Oral Daily  Hugelmeyer, Alexis, DO   300 mg at 04/04/17 1044  . hydrALAZINE (APRESOLINE) tablet 25 mg  25 mg Oral BID Hugelmeyer, Alexis, DO   25 mg at 04/04/17 1044  . insulin aspart (novoLOG) injection 0-15 Units  0-15 Units Subcutaneous Q4H Hugelmeyer, Alexis, DO   2 Units at 04/04/17 0057  . insulin glargine (LANTUS) injection 20 Units  20 Units Subcutaneous QHS Hugelmeyer, Alexis, DO   20 Units at 04/04/17 0056  . isosorbide mononitrate (IMDUR) 24 hr tablet  60 mg  60 mg Oral Daily Hugelmeyer, Alexis, DO   60 mg at 04/04/17 1043  . loratadine (CLARITIN) tablet 5 mg  5 mg Oral Daily PRN Hugelmeyer, Alexis, DO      . LORazepam (ATIVAN) tablet 1 mg  1 mg Oral BID PRN Hugelmeyer, Alexis, DO      . metoprolol tartrate (LOPRESSOR) tablet 50 mg  50 mg Oral BID Hugelmeyer, Alexis, DO   50 mg at 04/04/17 1043  . mirabegron ER (MYRBETRIQ) tablet 25 mg  25 mg Oral Daily Hugelmeyer, Alexis, DO   25 mg at 04/04/17 1044  . nitroGLYCERIN (NITROSTAT) SL tablet 0.4 mg  0.4 mg Sublingual Q5 min PRN Hugelmeyer, Alexis, DO      . ondansetron (ZOFRAN) injection 4 mg  4 mg Intravenous Q6H PRN Hugelmeyer, Alexis, DO      . pantoprazole (PROTONIX) 80 mg in sodium chloride 0.9 % 250 mL (0.32 mg/mL) infusion  8 mg/hr Intravenous Continuous Hugelmeyer, Alexis, DO 25 mL/hr at 04/04/17 0057 8 mg/hr at 04/04/17 0057  . [START ON 04/07/2017] pantoprazole (PROTONIX) injection 40 mg  40 mg Intravenous Q12H Hugelmeyer, Alexis, DO      . [START ON 04/05/2017] pneumococcal 23 valent vaccine (PNU-IMMUNE) injection 0.5 mL  0.5 mL Intramuscular Tomorrow-1000 Hugelmeyer, Alexis, DO      . polyethylene glycol (MIRALAX / GLYCOLAX) packet 17 g  17 g Oral Daily PRN Hugelmeyer, Alexis, DO      . promethazine (PHENERGAN) tablet 25 mg  25 mg Oral Q6H PRN Hugelmeyer, Alexis, DO      . saccharomyces boulardii (FLORASTOR) capsule 250 mg  250 mg Oral Daily Hugelmeyer, Alexis, DO   250 mg at 04/04/17 1044  . traMADol (ULTRAM) tablet 50 mg  50 mg Oral Daily PRN  Hugelmeyer, Alexis, DO        Allergies as of 04/03/2017  . (No Known Allergies)     Review of Systems:    This is positive for those things mentioned in the HPI,       Physical Exam:  Vital signs in last 24 hours: Temp:  [97.4 F (36.3 C)-98.1 F (36.7 C)] 98 F (36.7 C) (06/24 1146) Pulse Rate:  [101-183] 183 (06/24 1146) Resp:  [13-22] 20 (06/24 1146) BP: (121-157)/(84-117) 121/84 (06/24 1146) SpO2:  [87 %-100 %] 99 % (06/24 1207) Weight:  [73 kg (161 lb)-73.4 kg (161 lb 14.4 oz)] 73.3 kg (161 lb 8 oz) (06/24 0435) Last BM Date: 04/03/17  General:  Well-developed, well-nourished and in no acute distress. Eating a regular diet Eyes:  anicteric. ENT:   Mouth and posterior pharynx free of lesions.  Neck:   supple w/o thyromegaly or mass.  Lungs: Clear to auscultation bilaterally. Heart:   S1S2, no rubs, murmurs, gallops. Abdomen:  soft, non-tender, no hepatosplenomegaly, hernia, or mass and BS+.  Rectal: Lymph:  no cervical or supraclavicular adenopathy. Extremities:   no edema Skin   no rash. Neuro:  A&O x 3.  Psych:  appropriate mood and  Affect.   Data Reviewed:   LAB RESULTS:  Recent Labs  04/04/17 0531 04/04/17 0731 04/04/17 1123  WBC 4.6 5.8 4.7  HGB 9.6* 9.9* 9.6*  HCT 28.3* 29.5* 28.1*  PLT 141* 171 145*   BMET  Recent Labs  04/03/17 1918 04/04/17 0531  NA 138 140  K 5.0 4.4  CL 108 110  CO2 23 21*  GLUCOSE 158* 72  BUN 50* 51*  CREATININE 2.53* 2.48*  CALCIUM 8.4* 8.4*  LFT  Recent Labs  04/04/17 0531  PROT 6.0*  ALBUMIN 3.3*  AST 23  ALT <5*  ALKPHOS 91  BILITOT 0.7   PT/INR  Recent Labs  04/03/17 1918  LABPROT 31.7*  INR 2.99    STUDIES: Dg Chest Portable 1 View  Result Date: 04/03/2017 CLINICAL DATA:  Cough and shortness of breath EXAM: PORTABLE CHEST 1 VIEW COMPARISON:  Chest radiograph 02/15/2017 FINDINGS: There is massive cardiomegaly. Differences from the prior study may be due to AP technique currently,  which exaggerates the heart size. There is no pneumothorax or sizable pleural effusion. There is bibasilar atelectasis. No other consolidation. No pulmonary edema. IMPRESSION: Massive cardiomegaly and bibasilar atelectasis without pulmonary edema. Electronically Signed   By: Ulyses Jarred M.D.   On: 04/03/2017 19:38     PREVIOUS ENDOSCOPIES:                Thanks    San Jetty MD  @  04/04/2017, 12:22 PM   Consultation  Referring Provider:      Primary Care Physician:  Suann Larry, MD Primary Gastroenterologist:         Reason for Consultation:          Impression / Plan:              HPI:   Molly Wolf is a 76 y.o. female   Past Medical History:  Diagnosis Date  . Anxiety   . Arthritis   . Chronic combined systolic and diastolic CHF (congestive heart failure) ([place])    a. 01/2008 Echo ([facility]): EF > 55%, mod-sev LVH w/ grade 1 DD, biatrial enlargement, mild AS, trace MR/TR; b. EF 45-50%, GR2DD, mild to moderate aortic stenosis, mild aortic insufficieny, mild to moderate mitral regurgitation, mild tricuspid regurgitation, mild biatrial enlargement, and a small pericardial effusion  . CKD (chronic kidney disease), stage IV ([place])   . Closed patellar sleeve fracture of right knee    a. 01/2017 - conservatively managed.  Marland Kitchen COPD (chronic obstructive pulmonary disease) ([place])   . Coronary artery disease    a. s/p remote stenting of unknown vessel @ [facility].  . Depression   . Diabetes mellitus with complication ([place])   . DVT (deep venous thrombosis) ([facility])    a. in the setting of right patellar fracture on Coumadin  . Hyperlipidemia   . Hypertension   . Parkinson disease [facility])     Past Surgical History:  Procedure Laterality Date  . BACK SURGERY    . CORONARY ANGIOPLASTY WITH STENT PLACEMENT    . NECK SURGERY      Family History  Problem Relation Age of Onset  . Cervical cancer Mother   . Kidney failure Father   . CAD Father   . CAD Brother   . Prostate  cancer Neg Hx   . Kidney cancer Neg Hx   . Bladder Cancer Neg Hx      Social History  Substance Use Topics  . Smoking status: Never Smoker  . Smokeless tobacco: Never Used  . Alcohol use No    Prior to Admission medications   Medication Sig Start Date End Date Taking? Authorizing Provider  acetaminophen (TYLENOL) 325 MG tablet Take 650 mg by mouth every 6 (six) hours as needed for mild pain.   Yes [provider]  atorvastatin (LIPITOR) 20 MG tablet Take 20 mg by mouth daily.   Yes [provider]  blood glucose meter kit and supplies KIT Dispense based on patient  and insurance preference. Use up to four times daily as directed. (FOR ICD-9 250.00, 250.01). 11/06/16  Yes Alfred Levins, [place], MD  Carbidopa-Levodopa ER (SINEMET CR) 25-100 MG tablet controlled release Take 2 tablets by mouth 4 (four) times daily. 03/05/17  Yes Hackney, Tina A, FNP  Coenzyme Q10 (COQ10) 50 MG CAPS Take 50 mg by mouth daily.   Yes [provider]  CRANBERRY PO Take 1 tablet by mouth daily.   Yes [provider]  escitalopram (LEXAPRO) 20 MG tablet Take 20 mg by mouth daily.   Yes [provider]  furosemide (LASIX) 20 MG tablet Take 1 tablet (20 mg total) by mouth daily. 03/05/17  Yes Hackney, Otila Kluver A, FNP  gabapentin (NEURONTIN) 300 MG capsule Take 300 mg by mouth daily.    Yes [provider]  glucose blood (ACCU-CHEK AVIVA PLUS) test strip Use as instructed 02/25/17  Yes Darylene Price A, FNP  hydrALAZINE (APRESOLINE) 25 MG tablet Take 1 tablet (25 mg total) by mouth 2 (two) times daily. 03/31/17 06/29/17 Yes End, Harrell Gave, MD  insulin glargine (LANTUS) 100 UNIT/ML injection Inject 40 Units into the skin at bedtime.    Yes [provider]  insulin lispro (HUMALOG) 100 UNIT/ML injection Inject 6 Units into the skin 3 (three) times daily with meals.    Yes [provider]  isosorbide mononitrate (IMDUR) 60 MG 24 hr tablet Take 1 tablet (60 mg  total) by mouth daily. 03/05/17  Yes Hackney, Otila Kluver A, FNP  loratadine (CLARITIN) 10 MG tablet Take 5 mg by mouth daily as needed for allergies.    Yes [provider]  LORazepam (ATIVAN) 1 MG tablet Take 1 tablet (1 mg total) by mouth 2 (two) times daily as needed for anxiety. 12/29/16  Yes Max Sane, MD  metoprolol tartrate (LOPRESSOR) 50 MG tablet Take 1 tablet (50 mg total) by mouth 2 (two) times daily. 03/31/17 06/29/17 Yes End, Harrell Gave, MD  mirabegron ER (MYRBETRIQ) 25 MG TB24 tablet Take 25 mg by mouth daily.   Yes [provider]  nitroGLYCERIN (NITROSTAT) 0.4 MG SL tablet Place 0.4 mg under the tongue every 5 (five) minutes as needed for chest pain.   Yes [provider]  Omega-3 Fatty Acids (FISH OIL) 1000 MG CAPS Take 1,000 mg by mouth daily.   Yes [provider]  polyethylene glycol (MIRALAX / GLYCOLAX) packet Take 17 g by mouth daily as needed for mild constipation.   Yes [provider]  promethazine (PHENERGAN) 25 MG tablet Take 25 mg by mouth every 6 (six) hours as needed for nausea or vomiting.   Yes [provider]  saccharomyces boulardii (FLORASTOR) 250 MG capsule Take 250 mg by mouth daily.   Yes [provider]  traMADol (ULTRAM) 50 MG tablet Take 1 tablet (50 mg total) by mouth daily as needed. 12/29/16  Yes Max Sane, MD  warfarin (COUMADIN) 3 MG tablet Take 3 mg by mouth daily.   Yes [provider]    Current Facility-Administered Medications  Medication Dose Route Frequency Provider Last Rate Last Dose  . acetaminophen (TYLENOL) tablet 650 mg  650 mg Oral Q6H PRN Hugelmeyer, Alexis, DO      . atorvastatin (LIPITOR) tablet 20 mg  20 mg Oral Daily Hugelmeyer, Alexis, DO      . Carbidopa-Levodopa ER (SINEMET CR) 25-100 MG tablet controlled release 2 tablet  2 tablet Oral QID Hugelmeyer, Alexis, DO   2 tablet at 04/04/17 1044  . dextrose 50 %  solution 50 mL  1 ampule Intravenous Once Vaughan Basta, MD      . escitalopram (LEXAPRO) tablet 20 mg  20 mg Oral Daily Hugelmeyer, Alexis, DO   20 mg at 04/04/17 1045  . gabapentin (NEURONTIN) capsule 300 mg  300 mg Oral Daily Hugelmeyer, Alexis, DO   300 mg at 04/04/17 1044  . hydrALAZINE (APRESOLINE) tablet 25 mg  25 mg Oral BID Hugelmeyer, Alexis, DO   25 mg at 04/04/17 1044  . insulin aspart (novoLOG) injection 0-15 Units  0-15 Units Subcutaneous Q4H Hugelmeyer, Alexis, DO   2 Units at 04/04/17 0057  . insulin glargine (LANTUS) injection 20 Units  20 Units Subcutaneous QHS Hugelmeyer, Alexis, DO   20 Units at 04/04/17 0056  . isosorbide mononitrate (IMDUR) 24 hr tablet 60 mg  60 mg Oral Daily Hugelmeyer, Alexis, DO   60 mg at 04/04/17 1043  . loratadine (CLARITIN) tablet 5 mg  5 mg Oral Daily PRN Hugelmeyer, Alexis, DO      . LORazepam (ATIVAN) tablet 1 mg  1 mg Oral BID PRN Hugelmeyer, Alexis, DO      . metoprolol tartrate (LOPRESSOR) tablet 50 mg  50 mg Oral BID Hugelmeyer, Alexis, DO   50 mg at 04/04/17 1043  . mirabegron ER (MYRBETRIQ) tablet 25 mg  25 mg Oral Daily Hugelmeyer, Alexis, DO   25 mg at 04/04/17 1044  . nitroGLYCERIN (NITROSTAT) SL tablet 0.4 mg  0.4 mg Sublingual Q5 min PRN Hugelmeyer, Alexis, DO      . ondansetron (ZOFRAN) injection 4 mg  4 mg Intravenous Q6H PRN Hugelmeyer, Alexis, DO      . pantoprazole (PROTONIX) 80 mg in sodium chloride 0.9 % 250 mL (0.32 mg/mL) infusion  8 mg/hr Intravenous Continuous Hugelmeyer, Alexis, DO 25 mL/hr at 04/04/17 0057 8 mg/hr at 04/04/17 0057  . [START ON 04/07/2017] pantoprazole (PROTONIX) injection 40 mg  40 mg Intravenous Q12H Hugelmeyer, Alexis, DO      . [START ON 04/05/2017] pneumococcal 23 valent vaccine (PNU-IMMUNE) injection 0.5 mL  0.5 mL Intramuscular Tomorrow-1000 Hugelmeyer, Alexis, DO      . polyethylene glycol (MIRALAX / GLYCOLAX) packet 17 g  17 g Oral Daily PRN Hugelmeyer, Alexis, DO      . promethazine (PHENERGAN) tablet 25 mg  25 mg Oral Q6H PRN Hugelmeyer,  Alexis, DO      . saccharomyces boulardii (FLORASTOR) capsule 250 mg  250 mg Oral Daily Hugelmeyer, Alexis, DO   250 mg at 04/04/17 1044  . traMADol (ULTRAM) tablet 50 mg  50 mg Oral Daily PRN Hugelmeyer, Alexis, DO        Allergies as of 04/03/2017  . (No Known Allergies)     Review of Systems:    This is positive for those things mentioned in the HPI,       Physical Exam:  Vital signs in last 24 hours: Temp:  [97.4 F (36.3 C)-98.1 F (36.7 C)] 98 F (36.7 C) (06/24 1146) Pulse Rate:  [101-183] 183 (06/24 1146) Resp:  [13-22] 20 (06/24 1146) BP: (121-157)/(84-117) 121/84 (06/24 1146) SpO2:  [87 %-100 %] 99 % (06/24 1207) Weight:  [73 kg (161 lb)-73.4 kg (161 lb 14.4 oz)] 73.3 kg (161 lb 8 oz) (06/24 0435) Last BM Date: 04/03/17  General:  Well-developed, well-nourished and in no acute distress Eyes:  anicteric. ENT:   Mouth and posterior pharynx free of lesions.  Neck:   supple w/o thyromegaly or mass.  Lungs: Clear to auscultation bilaterally. Heart:  S1S2, no rubs, murmurs, gallops. Abdomen:  soft, non-tender, no hepatosplenomegaly, hernia, or mass and BS+.  Rectal: Lymph:  no cervical or supraclavicular adenopathy. Extremities:   no edema Skin   no rash. Neuro:  A&O x 3.  Psych:  appropriate mood and  Affect.   Data Reviewed:   LAB RESULTS:  Recent Labs  04/04/17 0531 04/04/17 0731 04/04/17 1123  WBC 4.6 5.8 4.7  HGB 9.6* 9.9* 9.6*  HCT 28.3* 29.5* 28.1*  PLT 141* 171 145*   BMET  Recent Labs  04/03/17 1918 04/04/17 0531  NA 138 140  K 5.0 4.4  CL 108 110  CO2 23 21*  GLUCOSE 158* 72  BUN 50* 51*  CREATININE 2.53* 2.48*  CALCIUM 8.4* 8.4*   LFT  Recent Labs  04/04/17 0531  PROT 6.0*  ALBUMIN 3.3*  AST 23  ALT <5*  ALKPHOS 91  BILITOT 0.7   PT/INR  Recent Labs  04/03/17 1918  LABPROT 31.7*  INR 2.99    STUDIES: Dg Chest Portable 1 View  Result Date: 04/03/2017 CLINICAL DATA:  Cough and shortness of breath EXAM:  PORTABLE CHEST 1 VIEW COMPARISON:  Chest radiograph 02/15/2017 FINDINGS: There is massive cardiomegaly. Differences from the prior study may be due to AP technique currently, which exaggerates the heart size. There is no pneumothorax or sizable pleural effusion. There is bibasilar atelectasis. No other consolidation. No pulmonary edema. IMPRESSION: Massive cardiomegaly and bibasilar atelectasis without pulmonary edema. Electronically Signed   By: Ulyses Jarred M.D.   On: 04/03/2017 19:38     PREVIOUS ENDOSCOPIES:                Thanks   LOS: 1 day   @Carl  Simonne Maffucci, MD, [facility] @  04/04/2017, 12:22 PM

## 2017-04-05 LAB — BRAIN NATRIURETIC PEPTIDE: B NATRIURETIC PEPTIDE 5: 980 pg/mL — AB (ref 0.0–100.0)

## 2017-04-05 LAB — BASIC METABOLIC PANEL
Anion gap: 5 (ref 5–15)
BUN: 49 mg/dL — AB (ref 6–20)
CHLORIDE: 107 mmol/L (ref 101–111)
CO2: 24 mmol/L (ref 22–32)
CREATININE: 2.66 mg/dL — AB (ref 0.44–1.00)
Calcium: 8.3 mg/dL — ABNORMAL LOW (ref 8.9–10.3)
GFR calc Af Amer: 19 mL/min — ABNORMAL LOW (ref >60)
GFR calc non Af Amer: 16 mL/min — ABNORMAL LOW (ref >60)
GLUCOSE: 41 mg/dL — AB (ref 65–99)
POTASSIUM: 4.6 mmol/L (ref 3.5–5.1)
Sodium: 136 mmol/L (ref 135–145)

## 2017-04-05 LAB — GLUCOSE, CAPILLARY
GLUCOSE-CAPILLARY: 57 mg/dL — AB (ref 65–99)
GLUCOSE-CAPILLARY: 37 mg/dL — AB (ref 65–99)
GLUCOSE-CAPILLARY: 121 mg/dL — AB (ref 65–99)
GLUCOSE-CAPILLARY: 144 mg/dL — AB (ref 65–99)
GLUCOSE-CAPILLARY: 55 mg/dL — AB (ref 65–99)
GLUCOSE-CAPILLARY: 273 mg/dL — AB (ref 65–99)
Glucose-Capillary: 121 mg/dL — ABNORMAL HIGH (ref 65–99)
Glucose-Capillary: 128 mg/dL — ABNORMAL HIGH (ref 65–99)
Glucose-Capillary: 189 mg/dL — ABNORMAL HIGH (ref 65–99)
Glucose-Capillary: 68 mg/dL (ref 65–99)
Glucose-Capillary: 69 mg/dL (ref 65–99)

## 2017-04-05 LAB — HEMOGLOBIN A1C
HEMOGLOBIN A1C: 7.5 % — AB (ref 4.8–5.6)
MEAN PLASMA GLUCOSE: 169 mg/dL

## 2017-04-05 LAB — PROTIME-INR
INR: 2.63
Prothrombin Time: 28.6 seconds — ABNORMAL HIGH (ref 11.4–15.2)

## 2017-04-05 MED ORDER — DEXTROSE 50 % IV SOLN
INTRAVENOUS | Status: AC
  Administered 2017-04-05: 05:00:00
  Filled 2017-04-05: qty 50

## 2017-04-05 MED ORDER — METOPROLOL TARTRATE 50 MG PO TABS
75.0000 mg | ORAL_TABLET | Freq: Two times a day (BID) | ORAL | Status: DC
  Administered 2017-04-05 – 2017-04-09 (×8): 75 mg via ORAL
  Filled 2017-04-05 (×9): qty 1

## 2017-04-05 MED ORDER — GLUCOSE 4 G PO CHEW
1.0000 | CHEWABLE_TABLET | Freq: Once | ORAL | Status: AC
  Administered 2017-04-05: 01:00:00 4 g via ORAL

## 2017-04-05 MED ORDER — DEXTROSE 50 % IV SOLN: 1.0000 | Freq: Once | INTRAVENOUS | Status: DC

## 2017-04-05 MED ORDER — DEXTROSE-NACL 5-0.9 % IV SOLN
INTRAVENOUS | Status: DC
  Administered 2017-04-05 (×2): via INTRAVENOUS

## 2017-04-05 MED ORDER — GLUCOSE 4 G PO CHEW
CHEWABLE_TABLET | ORAL | Status: AC
  Filled 2017-04-05: qty 1

## 2017-04-05 MED ORDER — DEXTROSE 50 % IV SOLN
1.0000 | Freq: Once | INTRAVENOUS | Status: AC
  Administered 2017-04-05: 50 mL via INTRAVENOUS

## 2017-04-05 NOTE — Evaluation (Addendum)
Physical Therapy Evaluation Patient Details Name: Molly Wolf MRN: [id number] DOB: August 21, 1941 Today's Date: 04/05/2017   History of Present Illness  Pt is a 76 y.o. female presenting to hospital with generalized weakness and dizziness x2 days and SOB; pt also with black stools.  Pt admitted with acute exacerbation of chronic combined systolic and diastolic heart failure, a-fib with RVR, AKI, and anemia.  PMH includes CHF, CKD, CAD, COPD, DM, DVT, back and neck surgery, and Parkinson's disease.  Clinical Impression  Prior to hospital admission, pt was ambulating with rollator with SBA for safety (also had assist on stairs); pt was also receiving HHPT.  Pt lives with her grandson and grandson's girlfriend in 1 level home.  Currently pt is SBA supine to sit and CGA with transfers and ambulation short distance in room with RW.  Pt demonstrating tremors (pt with h/o Parkinson's disease) but steady without loss of balance during functional mobility.  Pt would benefit from skilled PT to address noted impairments and functional limitations (see below for any additional details).  Upon hospital discharge, recommend pt discharge to home with HHPT and SBA for functional mobility (plus assist for stairs).    Follow Up Recommendations Home health PT;Supervision for mobility/OOB    Equipment Recommendations  Rolling walker with 5" wheels    Recommendations for Other Services       Precautions / Restrictions Precautions Precautions: Fall Restrictions Weight Bearing Restrictions: No      Mobility  Bed Mobility Overal bed mobility: Needs Assistance Bed Mobility: Supine to Sit     Supine to sit: Supervision;HOB elevated     General bed mobility comments: mild increased effort to perform; use of bed rail  Transfers Overall transfer level: Needs assistance Equipment used: Rolling walker (2 wheeled) Transfers: Sit to/from Stand Sit to Stand: Min guard         General transfer comment: mild  increased effort to stand but steady  Ambulation/Gait Ambulation/Gait assistance: Min guard Ambulation Distance (Feet): 20 Feet Assistive device: Rolling walker (2 wheeled)   Gait velocity: decreased   General Gait Details: mild decreased B step length; mildly shaky but steady without loss of balance  Stairs            Wheelchair Mobility    Modified Rankin (Stroke Patients Only)       Balance Overall balance assessment: Needs assistance Sitting-balance support: Bilateral upper extremity supported;Feet supported Sitting balance-Leahy Scale: Fair Sitting balance - Comments: initial posterior lean sitting but able to maintain balance on own   Standing balance support: Bilateral upper extremity supported (on RW) Standing balance-Leahy Scale: Good Standing balance comment: with ambulation                             Pertinent Vitals/Pain Pain Assessment: No/denies pain  Vitals (HR and O2 on 2 L via nasal cannula) stable and WFL throughout treatment session.    Home Living Family/patient expects to be discharged to:: Private residence Living Arrangements: Children (Grandson and his girlfriend) Available Help at Discharge: Family;Available 24 hours/day Type of Home: House Home Access: Stairs to enter Entrance Stairs-Rails: None Entrance Stairs-Number of Steps: 2-3 Home Layout: One level Home Equipment: Walker - 4 wheels;Wheelchair - manual;Shower seat;Bedside commode;Grab bars - toilet      Prior Function Level of Independence: Needs assistance   Gait / Transfers Assistance Needed: Pt ambulates household distances with 4ww and SBA for safety; uses manual w/c for longer/community distances.  Has 2 assist for stairs.     Comments: Assist from daughter Lupita Leash for ADL's/bathing PRN; independent with dressing; reports no recent falls.  Addendum:  Pt reports she stopped wearing R LE brace about 3 weeks ago (pt with h/o patellar fx March 2018 and was  previously noted to be WBAT in brace) and has had no issues.     Hand Dominance        Extremity/Trunk Assessment   Upper Extremity Assessment Upper Extremity Assessment: Generalized weakness    Lower Extremity Assessment Lower Extremity Assessment: Generalized weakness    Cervical / Trunk Assessment Cervical / Trunk Assessment: Normal  Communication   Communication: No difficulties  Cognition Arousal/Alertness: Awake/alert Behavior During Therapy: WFL for tasks assessed/performed Overall Cognitive Status: Within Functional Limits for tasks assessed                                        General Comments General comments (skin integrity, edema, etc.): Pt resting in bed upon PT entry.  Nursing cleared pt for participation in physical therapy.  Pt agreeable to PT session.    Exercises     Assessment/Plan    PT Assessment Patient needs continued PT services  PT Problem List Decreased strength;Decreased balance;Decreased mobility       PT Treatment Interventions DME instruction;Gait training;Stair training;Functional mobility training;Therapeutic activities;Therapeutic exercise;Balance training;Patient/family education    PT Goals (Current goals can be found in the Care Plan section)  Acute Rehab PT Goals Patient Stated Goal: to go home PT Goal Formulation: With patient Time For Goal Achievement: 04/19/17 Potential to Achieve Goals: Good    Frequency Min 2X/week   Barriers to discharge        Co-evaluation               AM-PAC PT "6 Clicks" Daily Activity  Outcome Measure Difficulty turning over in bed (including adjusting bedclothes, sheets and blankets)?: A Little Difficulty moving from lying on back to sitting on the side of the bed? : A Little Difficulty sitting down on and standing up from a chair with arms (e.g., wheelchair, bedside commode, etc,.)?: A Little Help needed moving to and from a bed to chair (including a wheelchair)?:  A Little Help needed walking in hospital room?: A Little Help needed climbing 3-5 steps with a railing? : A Lot 6 Click Score: 17    End of Session Equipment Utilized During Treatment: Gait belt;Oxygen (2 L O2 via nasal cannula) Activity Tolerance: Patient tolerated treatment well Patient left: in chair;with call bell/phone within reach;with chair alarm set Nurse Communication: Mobility status;Precautions PT Visit Diagnosis: Other abnormalities of gait and mobility (R26.89);Muscle weakness (generalized) (M62.81);History of falling (Z91.81)    Time: 0454-0981 PT Time Calculation (min) (ACUTE ONLY): 36 min   Charges:   PT Evaluation $PT Eval Low Complexity: 1 Procedure PT Treatments $Therapeutic Activity: 8-22 mins   PT G CodesHendricks Limes, PT 04/05/17, 12:20 PM [phone]

## 2017-04-05 NOTE — Progress Notes (Signed)
[facility] [place] [facility]   PATIENT NAME: Molly CunasJudy Wolf    MR#:  [id number]  DATE OF BIRTH:  1941/03/30  SUBJECTIVE:  CHIEF COMPLAINT:   Chief Complaint  Patient presents with  . Weakness  . Melena     Has history of heart failure and A. fib and on Coumadin, for last few days and feeling some tightness in her chest and dizziness and shortness of breath. Also noted some dark stool for last 2-3 days.  REVIEW OF SYSTEMS:  CONSTITUTIONAL: No fever, fatigue or weakness.  EYES: No blurred or double vision.  EARS, NOSE, AND THROAT: No tinnitus or ear pain.  RESPIRATORY: No cough, shortness of breath, wheezing or hemoptysis.  CARDIOVASCULAR: No chest pain, orthopnea, edema.  GASTROINTESTINAL: No nausea, vomiting, diarrhea or abdominal pain.  GENITOURINARY: No dysuria, hematuria.  ENDOCRINE: No polyuria, nocturia,  HEMATOLOGY: No anemia, easy bruising or bleeding SKIN: No rash or lesion. MUSCULOSKELETAL: No joint pain or arthritis.   NEUROLOGIC: No tingling, numbness, weakness.  PSYCHIATRY: No anxiety or depression.   ROS  DRUG ALLERGIES:  No Known Allergies  VITALS:  Blood pressure 115/80, pulse 93, temperature 97.9 F (36.6 C), temperature source Oral, resp. rate 18, height 5' 7.5" (1.715 m), weight 69.1 kg (152 lb 6.4 oz), SpO2 98 %.  PHYSICAL EXAMINATION:  GENERAL:  76 y.o.-year-old patient lying in the bed with no acute distress.  EYES: Pupils equal, round, reactive to light and accommodation. No scleral icterus. Extraocular muscles intact.  HEENT: Head atraumatic, normocephalic. Oropharynx and nasopharynx clear.  NECK:  Supple, no jugular venous distention. No thyroid enlargement, no tenderness.  LUNGS: Normal breath sounds bilaterally, no wheezing, some crepitation. No use of accessory muscles of respiration.  CARDIOVASCULAR: irregular S1, S2 normal. No murmurs .  ABDOMEN: Soft, nontender, nondistended. Bowel sounds present. No organomegaly or  mass.  EXTREMITIES: No pedal edema, cyanosis, or clubbing.  NEUROLOGIC: Cranial nerves II through XII are intact. Muscle strength 5/5 in all extremities. Sensation intact. Gait not checked.  PSYCHIATRIC: The patient is alert and oriented x 3.  SKIN: No obvious rash, lesion, or ulcer.   Physical Exam LABORATORY PANEL:   CBC  Recent Labs Lab 04/04/17 1123  WBC 4.7  HGB 9.6*  HCT 28.1*  PLT 145*   ------------------------------------------------------------------------------------------------------------------  Chemistries   Recent Labs Lab 04/04/17 0022 04/04/17 0531 04/05/17 0453  NA  --  140 136  K  --  4.4 4.6  CL  --  110 107  CO2  --  21* 24  GLUCOSE  --  72 41*  BUN  --  51* 49*  CREATININE  --  2.48* 2.66*  CALCIUM  --  8.4* 8.3*  MG 2.1  --   --   AST  --  23  --   ALT  --  <5*  --   ALKPHOS  --  91  --   BILITOT  --  0.7  --    ------------------------------------------------------------------------------------------------------------------  Cardiac Enzymes  Recent Labs Lab 04/04/17 0531 04/04/17 1123  TROPONINI 0.03* 0.03*   ------------------------------------------------------------------------------------------------------------------  RADIOLOGY:  Dg Chest Portable 1 View  Result Date: 04/03/2017 CLINICAL DATA:  Cough and shortness of breath EXAM: PORTABLE CHEST 1 VIEW COMPARISON:  Chest radiograph 02/15/2017 FINDINGS: There is massive cardiomegaly. Differences from the prior study may be due to AP technique currently, which exaggerates the heart size. There is no pneumothorax or sizable pleural effusion. There is bibasilar atelectasis. No other consolidation. No pulmonary  edema. IMPRESSION: Massive cardiomegaly and bibasilar atelectasis without pulmonary edema. Electronically Signed   By: Deatra Robinson M.D.   On: 04/03/2017 19:38    ASSESSMENT AND PLAN:   Active Problems:   Acute combined systolic (congestive) and diastolic (congestive) heart  failure (HCC)  This is a 76 y.o. female with a history of chronic combined systolic and diastolic heart failure, CKD4, pulmonary hypertension, CAD status post PCI, Parkinson's, diabetes, COPD, hypertension, hyperlipidemia, DVT on Coumadin, atrial fibrillation diagnosed 03/31/2017 now being admitted with:  #. Acute exacerbation of chronic combined systolic and diastolic heart failure - Continue Telemetry monitoring. - Beta blocker, diuretic, nitrate.  - Intake/output- have 1300 ml negative fluid balance, daily weight. - Trend troponins, check lipids and TSH. - recent Echo April 2018, repeated echo shows worsening EF. - Cardiology consultation appreciated.  #. Atrial fibrillation with RVR - Rate control with metoprolol, increase the doses still was tachycardic. - INR therapeutic at 2.99 Will hold Coumadin for now due to GI bleed.  #. Acute kidney injury on CKD4 - Avoid nephrotoxic medications - Monitor with IV Lasix. Stable.  #. Anemia, ? UGIB (positive melena, guaiac negative stool) - Repeat stool FOBT - IV Protonix - Serial CBC and transfuse as needed- hemoglobin stable. - Hold anticoagulants - GI consultation appreciated plan for EGD once INR is normal.  #. History of hypertension - Continue hydralazine, metoprolol, Lasix  #. History of Parkinson's - Continue Sinemet  #. History of hyperlipidemia - Continue atorvastatin  #. History of depression - Continue Lexapro  #. History of neuropathy - Continue gabapentin  #. History of diabetes now Hypoglycemia -Given Lantus at half dose -Accu-Cheks every 4 hours with regular insulin sliding scale coverage - now stopped lantus, and started in D5 NS.  # Hypoglycemia    As she was kept NPO- had hypoglycemia, was on IV dextrose.    Resumed diet, still low Blood sugar today- keep on D5 NS drip.  All the records are reviewed and case discussed with Care Management/Social Workerr. Management plans discussed with the  patient, family and they are in agreement.  CODE STATUS: full.  TOTAL TIME TAKING CARE OF THIS PATIENT: 35 minutes.    POSSIBLE D/C IN 2-3 DAYS, DEPENDING ON CLINICAL CONDITION.   Altamese Dilling M.D on 04/05/2017   Between 7am to 6pm - Pager - [phone]  After 6pm go to [email or web address] - password [email or web address]  [facility]  Office  [phone]  CC: Primary care physician; Darliss Ridgel, MD  Note: This dictation was prepared with Dragon dictation along with smaller phrase technology. Any transcriptional errors that result from this process are unintentional.

## 2017-04-05 NOTE — Progress Notes (Addendum)
Wyline MoodKiran Zeppelin Beckstrand MD, MRCP(U.K) [address]  [address]  [place], [place] [address]  Main: [phone]    Renella CunasJudy Mckinny is being followed for an upper GI bleed  Day 1 of follow up   Subjective:  Appears still having difficulty with breathing ,   Objective: Vital signs in last 24 hours: Vitals:   04/04/17 1924 04/05/17 0500 04/05/17 0604 04/05/17 0759  BP: 139/84  116/87 122/79  Pulse: (!) 116  98 (!) 116  Resp: 17  18   Temp: 98.6 F (37 C)  98 F (36.7 C)   TempSrc: Oral  Oral   SpO2: 100%  99%   Weight:  152 lb 6.4 oz (69.1 kg)    Height:       Weight change: -8 lb 9.6 oz (-3.901 kg)  Intake/Output Summary (Last 24 hours) at 04/05/17 1034 Last data filed at 04/05/17 1010  Gross per 24 hour  Intake          1609.17 ml  Output             2700 ml  Net         -1090.83 ml     Exam: Heart:: S1S2 present or without murmur or extra heart sounds Lungs:few expiratory wheezes and rhonchi throughout both lung fields Abdomen: soft, nontender, normal bowel sounds   Lab Results: @LABTEST2 @ Micro Results: No results found for this or any previous visit (from the past 240 hour(s)). Studies/Results: Dg Chest Portable 1 View  Result Date: 04/03/2017 CLINICAL DATA:  Cough and shortness of breath EXAM: PORTABLE CHEST 1 VIEW COMPARISON:  Chest radiograph 02/15/2017 FINDINGS: There is massive cardiomegaly. Differences from the prior study may be due to AP technique currently, which exaggerates the heart size. There is no pneumothorax or sizable pleural effusion. There is bibasilar atelectasis. No other consolidation. No pulmonary edema. IMPRESSION: Massive cardiomegaly and bibasilar atelectasis without pulmonary edema. Electronically Signed   By: Deatra RobinsonKevin  Herman M.D.   On: 04/03/2017 19:38   Medications: I have reviewed the patient's current medications. Scheduled Meds: . atorvastatin  20 mg Oral Daily  . Carbidopa-Levodopa ER  2 tablet Oral QID  . dextrose  1 ampule  Intravenous Once  . dextrose  1 ampule Intravenous Once  . escitalopram  20 mg Oral Daily  . furosemide  40 mg Intravenous Q12H  . gabapentin  300 mg Oral Daily  . glucose      . insulin aspart  0-5 Units Subcutaneous QHS  . insulin aspart  0-9 Units Subcutaneous TID WC  . isosorbide mononitrate  60 mg Oral Daily  . metoprolol tartrate  50 mg Oral BID  . mirabegron ER  25 mg Oral Daily  . [START ON 04/07/2017] pantoprazole  40 mg Intravenous Q12H  . pneumococcal 23 valent vaccine  0.5 mL Intramuscular Tomorrow-1000  . saccharomyces boulardii  250 mg Oral Daily   Continuous Infusions: . dextrose 5 % and 0.9% NaCl 75 mL/hr at 04/05/17 0851  . pantoprozole (PROTONIX) infusion 8 mg/hr (04/04/17 1618)   PRN Meds:.acetaminophen, loratadine, LORazepam, nitroGLYCERIN, ondansetron (ZOFRAN) IV, polyethylene glycol, promethazine, traMADol   Assessment: Active Problems:   Acute combined systolic (congestive) and diastolic (congestive) heart failure (HCC)   Renella CunasJudy Erdman 76 y.o. female with h/o CAD ,A fibb on coumadin admitted with tarry black stools for 2 days, shortness of breath.On admisison was in Afibb with RVR, pulmonary edema  . BNP 980 . On admission Hb 9.6 with MCV 84. Hb was 10.9  in 02/2017 .   Plan: 1. Continue PPI 2. EGD when cardio pulmonary status improves and can tolerate anesthesia better.    LOS: 2 days   Wyline Mood 04/05/2017, 10:34 AM

## 2017-04-05 NOTE — Progress Notes (Signed)
CBG= 37 , patient was symptomatic. 1 ampule of dextrose given and on rechecked CBG=121 mg/dL.  Patient is back to her baseline and talkative. Dr. Tobi BastosPyreddy is aware. No new order received. Will continue to monitor.

## 2017-04-05 NOTE — Progress Notes (Signed)
Inpatient Diabetes Program Recommendations  AACE/ADA: New Consensus Statement on Inpatient Glycemic Control (2015)  Target Ranges:  Prepandial:   less than 140 mg/dL      Peak postprandial:   less than 180 mg/dL (1-2 hours)      Critically ill patients:  140 - 180 mg/dL   Results for [name], Kahla (MRN [id number]) as of 04/05/2017 14:07  Ref. Range 04/04/2017 07:40 04/04/2017 09:29 04/04/2017 11:48 04/04/2017 14:39 04/04/2017 16:29 04/04/2017 18:27 04/04/2017 20:16 04/04/2017 21:02 04/04/2017 22:09 04/05/2017 00:06 04/05/2017 00:52 04/05/2017 01:52 04/05/2017 04:55 04/05/2017 05:33 04/05/2017 07:42 04/05/2017 08:20  Glucose-Capillary Latest Ref Range: 65 - 99 mg/dL 53 (L) 85 73 83 71 409102 (H) 67 88 104 (H) 69 68 57 (L) 37 (LL) 121 (H) 55 (L) 144 (H)   Review of Glycemic Control  Diabetes history: DM2 Outpatient Diabetes medications: Lantus 40 units daily, Humalog 2-3 units TID with meals Current orders for Inpatient glycemic control: Novolog 0-9 units TID with meals, Novolog 0-5 units QHS  Inpatient Diabetes Program Recommendations: Diet: Please consider ordering Glucerna (or Ensure if patient continues to have poor PO intake with meals) BID between meals. Outpatient Follow Up: Recommend patient follow up with PCP within 1 week of discharge regarding DM control.  NOTE: Spoke with patient about diabetes and home regimen for diabetes control. Patient is alert and oriented and able to discuss history. Patient reports that [place]in [place] for diabetes [place]. Patient states that [place] moved from [place] about 7 months ago and she has her first appointment with new local PCP in August. Patient states that due to her tremors from parkinson's dx she is no longer able to give herself her insulin so her grandson administers her insulin injections. Patient reports that she is taking Lantus 40 units daily in the morning. She reports that she was having hypoglycemia at night so her Lantus was  changed from being given at bedtime to being given in the morning. Patient reports that also takes Humalog 2-3 units TID with meals depending on glucose and meal intake. Patient states that she takes her insulin as prescribed and does not alter or skip insulin dosages.  Patient states that she recently has not been able to eat as much due to "food making me feel sick".  Patient states that her grandson checks her glucose 3 times per day and that it has been trending well recently and she denies frequent hypoglycemia (since switching her Lantus to daily).  Inquired about whether patient took Lantus on 04/03/17 and she states she is not for sure since she was feeling so badly. Noted patient received Lantus 20 units on 04/04/17 at 00:56 and Novolog 2 units on 04/04/17 at 00:57. Patient has not received any other insulin since then and patient has experienced several episodes of hypoglycemia. Noted dextrose was added to IV fluids today and patient reports that she has had poor PO intake today but she was able to eat some cream potatoes at lunch. Patient states that she does not like to eat meat. Discussed how protein helps maintain glycemic control. Encouraged patient to try Glucerna or Ensure supplements to increase protein and carbohydrate intake. Patient states that she is willing to try supplements but prefers chocolate. Provided patient to chocolate Ensure to try (no chocolate Glucerna available on unit).  Patient verbalized understanding of information discussed and she states that she has no further questions at this time related to diabetes.  Thanks, Orlando PennerMarie Parminder Trapani, RN, MSN,  CDE Diabetes Coordinator Inpatient Diabetes Program [phone] (Team Pager)

## 2017-04-05 NOTE — Progress Notes (Addendum)
Progress Note  Patient Name: Molly Wolf Date of Encounter: 04/05/2017  Primary Cardiologist: Dr. Yvonne Kendall, MD  Subjective   Patient denies CP, feeling as if her heart is racing or skipping beats, SOB, and dizziness. She also denies abdominal pains but endorses mild bloating / gas. She reports her LBM was the episode of melena at home and she has not had a bowel movement since her admission. She does not report any changes in urination. She notes chronic sleep problems and reports fatigue. She also ntoes chronic reduced appetite though she did eat most of her oatmeal this morning. She c/o feeling cold and so an extra blanket was brought in and her thermostat adjusted.   Inpatient Medications    Scheduled Meds: . atorvastatin  20 mg Oral Daily  . Carbidopa-Levodopa ER  2 tablet Oral QID  . dextrose  1 ampule Intravenous Once  . dextrose  1 ampule Intravenous Once  . escitalopram  20 mg Oral Daily  . furosemide  40 mg Intravenous Q12H  . gabapentin  300 mg Oral Daily  . glucose      . insulin aspart  0-5 Units Subcutaneous QHS  . insulin aspart  0-9 Units Subcutaneous TID WC  . isosorbide mononitrate  60 mg Oral Daily  . metoprolol tartrate  50 mg Oral BID  . mirabegron ER  25 mg Oral Daily  . [START ON 04/07/2017] pantoprazole  40 mg Intravenous Q12H  . pneumococcal 23 valent vaccine  0.5 mL Intramuscular Tomorrow-1000  . saccharomyces boulardii  250 mg Oral Daily   Continuous Infusions: . dextrose 5 % and 0.9% NaCl    . pantoprozole (PROTONIX) infusion 8 mg/hr (04/04/17 1618)   PRN Meds: acetaminophen, loratadine, LORazepam, nitroGLYCERIN, ondansetron (ZOFRAN) IV, polyethylene glycol, promethazine, traMADol   Vital Signs    Vitals:   04/04/17 1924 04/05/17 0500 04/05/17 0604 04/05/17 0759  BP: 139/84  116/87 122/79  Pulse: (!) 116  98 (!) 116  Resp: 17  18   Temp: 98.6 F (37 C)  98 F (36.7 C)   TempSrc: Oral  Oral   SpO2: 100%  99%   Weight:  152 lb 6.4  oz (69.1 kg)    Height:        Intake/Output Summary (Last 24 hours) at 04/05/17 0850 Last data filed at 04/05/17 0300  Gross per 24 hour  Intake          1249.17 ml  Output             1800 ml  Net          -550.83 ml   Filed Weights   04/03/17 2334 04/04/17 0435 04/05/17 0500  Weight: 161 lb 14.4 oz (73.4 kg) 161 lb 8 oz (73.3 kg) 152 lb 6.4 oz (69.1 kg)    Physical Exam   GEN: Thin and mildly frail female with full body Parkinson's tremors. Resting comfortably and in no acute distress.  HEENT: Grossly normal.  Neck: Supple, no JVD, no carotid bruits, no masses. Cardiac: IR, IR, no murmurs, rubs, or gallops. No clubbing, cyanosis, edema.  Radials/DP/PT 2+ and equal bilaterally.  Respiratory:  Respirations regular and unlabored, clear to auscultation bilaterally. No wheezing. On nasal cannula. GI: Soft, nontender, nondistended, BS + x 4. MS: no deformity or atrophy. Skin: warm and dry, no rash. No lower extremity edema.  Neuro:  Strength and sensation are intact. No reported numbness, tingling, weakness. Psych: AAOx3.  Normal affect.  Labs  Chemistry Recent Labs Lab 04/03/17 1918 04/04/17 0531 04/05/17 0453  NA 138 140 136  K 5.0 4.4 4.6  CL 108 110 107  CO2 23 21* 24  GLUCOSE 158* 72 41*  BUN 50* 51* 49*  CREATININE 2.53* 2.48* 2.66*  CALCIUM 8.4* 8.4* 8.3*  PROT 6.1* 6.0*  --   ALBUMIN 3.4* 3.3*  --   AST 16 23  --   ALT <5* <5*  --   ALKPHOS 66 91  --   BILITOT 0.6 0.7  --   GFRNONAA 17* 18* 16*  GFRAA 20* 20* 19*  ANIONGAP 7 9 5      Hematology Recent Labs Lab 04/04/17 0531 04/04/17 0731 04/04/17 1123  WBC 4.6 5.8 4.7  RBC 3.28* 3.44* 3.34*  HGB 9.6* 9.9* 9.6*  HCT 28.3* 29.5* 28.1*  MCV 86.2 85.7 84.1  MCH 29.2 28.8 28.8  MCHC 33.8 33.6 34.3  RDW 15.0* 15.3* 15.0*  PLT 141* 171 145*    Cardiac Enzymes Recent Labs Lab 04/03/17 1918 04/04/17 0022 04/04/17 0531 04/04/17 1123  TROPONINI 0.03* 0.03* 0.03* 0.03*     BNP Recent  Labs Lab 04/03/17 1918 04/05/17 0453  BNP 1,139.0* 980.0*     Lab Results  Component Value Date   INR 2.63 04/05/2017   INR 2.99 04/03/2017   INR 3.0 03/31/2017    Radiology    Dg Chest Portable 1 View  Result Date: 04/03/2017 CLINICAL DATA:  Cough and shortness of breath EXAM: PORTABLE CHEST 1 VIEW COMPARISON:  Chest radiograph 02/15/2017 FINDINGS: There is massive cardiomegaly. Differences from the prior study may be due to AP technique currently, which exaggerates the heart size. There is no pneumothorax or sizable pleural effusion. There is bibasilar atelectasis. No other consolidation. No pulmonary edema. IMPRESSION: Massive cardiomegaly and bibasilar atelectasis without pulmonary edema. Electronically Signed   By: Deatra RobinsonKevin  Herman M.D.   On: 04/03/2017 19:38    Telemetry    Irregularly irregular Afib with HR 103 and occasional PVCs - Personally Reviewed  Cardiac Studies   04/04/17 Echo: -LVEF depressed at ~35% with severe hypokinesis of mid/distal anterior and anterolateral walls. Moderate LVH. A fib makes evaluation of LVEF difficult. In comparison to echo from 01/2017 these findings are more prominent and suggest worsening LVEF but in the 01/2017 study, apical window were foreshortened, making comparison difficult.  -Aortic valve thickened, calcified with mildly restricted motion and mild regurgitation. -Mitral valve: moderate regurgitation -Left atrium: moderately dilated -Right atrium: mildly dilated -Pulmonary arteries: PA peak pressure 65mmHg (S).  -Pericardium, extracardiac: Small pericardial effusion identified.  02/10/17 Long term monitor (14 days) - Predominant NSR with average rate 74 bpm (range 56-103 bpm). Patient triggered event corresponds to sinus rhythm. Occasional PACs; frequent PVCs. Two episodes nonsustained ventricular tachycardia lasting up to 5 beats. 368 episodes of SVT with max rate 182 bpm and longest episode 15 min, 35 sec (average rate 102 bpm).  Irregularity during some episodes raises possibility of atrial fibrillation and/or atrial flutter with variable block. There numerous episodes of atrial tachycardia, possibly Afib vs atrial flutter with variable AV block vs sustained SVT. Most suggestive of SVT but atrial flutter and/or A fib cannot be excluded.  01/26/2017 Echo  -LV wall thickness was increased in pattern of mild LVH with moderate focal basal hypertrophy of septum. Systolic function mildly reduced with EF estimated 45-50%. Akinesis of basal mid inferolateral and inferior myocardium. Features consistent with grade 2 diastolic dysfunction. Aortic valve moderately thickened, mildly calcified leaflets. Cusp separation reduced.  Mild to moderate stenosis, mild regurgitation. Mitral valve: mild to moderate regurgitation. Left atrium: mildly dilated. Right ventricle: mildly dilated.   01/26/2017 US venous Img lower bilateral 1. Occlusive thrombus of the right peroneal vein in the calf. No evidence of additional thrombus of the bilateral lower extremities. 2. Left popliteal fossa complex cyst, probably a Baker cyst.  Patient Profile     76 y.o. female with history of CHF,CAD (s/p remote PCI at [facility]), COPD, CKD, Parkinson's, HL, HTN DVT on coumadin, & atrial fibrillation who presented to [facility] ED in afib with RVR for evaluation of generalized weakness, SOB, orthopnea, black stools, and dizziness X2 days and seen by Cardiology at the request of Dr. Tonye Royalty for heart failure.  Patient was recently hospitalized 01/2017 for patellar fx, complicated by CHF, 5/18 for CHF exacerbation, and seen 6/20 in clinic by Dr. Cristal Deer End for SOB and orthopnea with no real baseline change. She was noted to be in afib at that time and metoprolol increased and hydralazine decreased.  Assessment & Plan    1. Acute on chronic systolic / diastolic CHF - 6/24 Troponin 0.03 X3; BNP 980 - received lasix yesterday.  euvolemic today. - Stop IV Lasix in  setting of recent BMP and renal function.  - Follow I/O. Net today -540.   - Obtain daily weights.Current weight 152 (bedscale - net neg 1.2L for admission - suspect wt inaccurate).  Pt and family say dry wt is 161 @ home. -04/04/17 Echo shows LVEF depressed at ~35% with severe hypokinesis of mid/distal anterior and anterolateral walls. Moderate LVH. A fib makes evaluation of LVEF difficult. In comparison to echo from 01/2017 these findings are more prominent and suggest worsening LVEF but in the 01/2017 study, apical window were foreshortened, making comparison difficult. Aortic valve thickened, calcified with mildly restricted motion and mild regurgitation. Mitral valve: moderate regurgitation Left atrium: moderately dilated. Right atrium: mildly dilated. Pulmonary arteries: PA peak pressure (S). Pericardium, extracardiac: Small pericardial effusion identified. - CXR 6/23 shows massive cardiomegaly. No edema.  2. Atrial fibrillation:  -On arrival at ED 04/03/17, patient in Afib with RVR. - Tachycardic. Increase lopressor to 75 mg bid. -Continue to follow on telemetry and pressures - ? Additional titration of  blocker. - Afib may be exacerbating #1 -Assess ambulation HR closer to d/c -CHA2DS2VASc= CHF (1), hypertension (1), age (2), DM (1),  Female (1)= 6.  - Pending GI assessment - Recommend resuming long term anticoagulation recommended in setting of DVT. Patient initially placed on coumadin after DVT in setting of right patellar fracture. Currently held due to anemia and melena suggestive of GI bleed. Once labs show stabilization indicating GI bleed is no longer an issue, resume anticoagulation.  3. CKD Stage IV -Monitor renal and electrolyte labs: 6/24 CMP CO2 21, BUN, 51, Scr 2.48, calcium 8.4, total protein, 6.0, albumin 3.3, ALT <5.  -Stop lasix in setting of reduced kidney function. -Monitor electrolytes.  - Consider IVF - Continue to follow I/O  4. Anemia in setting of GI bleed  with melena -Admitted with complaints of near syncope and weakness most likely secondary to GI bleed and anemia - 6/24 most recent CBC abnormal: RBC 3.34, Hbg 9.6, HCT 28.1, thrombocytopenia with platelets 145 -Continue to monitor CBC for anemia -Monitor H/H -Continue PPI -Monitor pain relief medications and hold any that increase likelihood of GI bleed. Educate patient regarding ibuprofen and GI bleed. Patient currently taking Tylenol.  -Hold coumadin  - 6/25 PT 28.6 and INR  2.63 - Resume anticoagulation once cleared by GI.  6. HTN -Controlled - Continue to follow BP -Continue to manage with lopressor 50mg  tablet BID  7. CAD -S/p remote stenting of unknown vessel at [facility] -As per Dr. Tenny Craw' consult note on 04/04/17, it is not felt that the patient's current symptoms are the result of active ischemia  8. HLD - Continue Lipitor 20 mg PO daily -  04/04/17 HDL 37  9. DM  -Rapid change in blood glucose - both Hypoglycemia and Hyperglycemia this admission. Symptomatic hypoglycemia yesterday 6/24 -pushed dextrose IV. Hyperglycemia with last glucose check on 6/24 with glucose 144 -Achieve glycemic control with insulin aspart (novolog) injection 0-5 untis subcutaneous and implement hypoglycemia protocol if CBG <70. -Recheck CBG for glycemic control q4h. -Consider modification of diet if unable to control sugars.  10. COPD - No current exacerbations  - Currently on [address] with SpO2 99%. 6/24 at 87% at time of admission. - Continue to monitor for dips in O2 / ventilation data.  11. Parkinson's Disease -Continue carbidopa-levodopa ER 25-100mg  tablet controlled release 2 table, PO. 4 times daily.   Signed, Marisue Ivan, Student-PA  04/05/2017, 8:50 AM    _____________  Addendum:  Patient seen with Ms. Visser and examined independently.  Breathing improved this am.  Minus 1.2L for admission.  Wt likely inaccurate (bedscale - down 9 lbs? - dry wt 161 lbs @ home).  Remains in AF.  Rates  variable but generally trending over 100.  Exam - pleasant, nad, aaox3. Neck supple w/o bruits/jvd.  Lungs cta. Cor IR, IR, no murmurs. Abd soft, nt/nd/bs+x4. Ext no cce.  GI to see today re: melena (none since admission).  Echo shows worsening of lv fxn, ~ 35%. ? Role of ongoing poorly controlled afib.  Will increase lopressor to 75 mg bid for now.  May need to titrate further if bp allows.  Would not use digoxin in setting of CKD and age.  Euvolemic on exam.  Creat up this am.  Agree with holding lasix.  Cont  blocker and nitrate. No acei/arb/arni/spiro in setting of CKD IV.  Hydral on hold due to soft bps.  With LV dysfxn, CHF, and difficult to manage HRs, we will likely need to consider rhythm mgmt.  LA mod dil on echo.  May need to add antiarrhythmic therapy prior to DCCV.  INR has been Rx however, coumadin now on hold in setting of melena/anemia.  Cont to hold coumadin until cleared by GI.  If OAC required to remain on hold for a longer period of time, we may also need to consider IVC filter in setting of R peroneal vein DVT noted post-op in April (only 2 mos of therapy thus far). Finally, troponin is mildly elevated - flat trend @ 0.03 x 4.  This does not represent ACS.  No c/p.  Cont med Rx w/  blocker, nitrate, statin.  Not on ASA 2/2 long term coumadin.  Nicolasa Ducking, NP 04/05/2017, 11:26 AM

## 2017-04-05 NOTE — Care Management (Signed)
Attempted to call daughter, Freida BusmanDonna Lucas. No answer and no voice mail available.

## 2017-04-05 NOTE — Progress Notes (Signed)
A & O x2. 2 L of oxygen. A fib. Takes meds ok. Pt has not reported any pain. Up to bsc and tolerated it well. fs are stable. Up with PT and tolerated it well. Pt has no further concerns at this time.

## 2017-04-05 NOTE — Care Management Note (Signed)
Case Management Note  Patient Details  Name: Renella CunasJudy Hanway MRN: [id number] Date of Birth: [birth year]  Subjective/Objective:  TC to patients daughter, Freida BusmanDonna Lucas. She states patient lives at home with her son and his girlfriend. The girlfriend is with patient during the day. They assist patient with her adls and meals. She uses a walker. Patient active with [facility] for SN and PT. Agency notified of admission.                   Action/Plan: Will need resumption of care orders for SN and PT at DC  Expected Discharge Date:  04/05/17               Expected Discharge Plan:  Home w Home Health Services  In-House Referral:     Discharge planning Services  CM Consult  Post Acute Care Choice:  Resumption of Svcs/PTA Provider Choice offered to:  Adult Children  DME Arranged:    DME Agency:     HH Arranged:  RN, PT HH Agency:  [facility]  Status of Service:  In process, will continue to follow  If discussed at Long Length of Stay Meetings, dates discussed:    Additional Comments:  Marily MemosLisa M Jerelle Virden, RN 04/05/2017, 2:09 PM

## 2017-04-05 NOTE — Progress Notes (Addendum)
CBG=69 at Midnight. Patient has been drinking orange juice the whole night and still having issues with hypoglycemia. Patient stated, "I'm tired of drinking the orange juice." Patient  refused to drink anything else to keep her blood sugar up.  Patient was asymptomatic and the RN is fearing for the worse in the morning. Dr. Emmit PomfretHugelmeyer informed and she indicated to give 1 glucose tablet and stop checking blood sugar and no need to give dextrose IV push unless patient becomes symptomatic. She added the RN should check CBG  and treat with dextrose if  patient exhibits any s/s of  Hypoglycemia. Patient offered the tablet and will recheck   and continue to monitor.

## 2017-04-06 DIAGNOSIS — D649 Anemia, unspecified: Secondary | ICD-10-CM

## 2017-04-06 DIAGNOSIS — I481 Persistent atrial fibrillation: Secondary | ICD-10-CM

## 2017-04-06 DIAGNOSIS — I5041 Acute combined systolic (congestive) and diastolic (congestive) heart failure: Secondary | ICD-10-CM

## 2017-04-06 LAB — GLUCOSE, CAPILLARY
GLUCOSE-CAPILLARY: 129 mg/dL — AB (ref 65–99)
GLUCOSE-CAPILLARY: 136 mg/dL — AB (ref 65–99)
Glucose-Capillary: 177 mg/dL — ABNORMAL HIGH (ref 65–99)
Glucose-Capillary: 152 mg/dL — ABNORMAL HIGH (ref 65–99)
Glucose-Capillary: 155 mg/dL — ABNORMAL HIGH (ref 65–99)

## 2017-04-06 LAB — BASIC METABOLIC PANEL
Anion gap: 7 (ref 5–15)
BUN: 52 mg/dL — ABNORMAL HIGH (ref 6–20)
CALCIUM: 8 mg/dL — AB (ref 8.9–10.3)
CHLORIDE: 106 mmol/L (ref 101–111)
CO2: 23 mmol/L (ref 22–32)
Creatinine, Ser: 2.76 mg/dL — ABNORMAL HIGH (ref 0.44–1.00)
GFR calc non Af Amer: 16 mL/min — ABNORMAL LOW (ref >60)
GFR, EST AFRICAN AMERICAN: 18 mL/min — AB (ref >60)
Glucose, Bld: 146 mg/dL — ABNORMAL HIGH (ref 65–99)
Potassium: 5.1 mmol/L (ref 3.5–5.1)
SODIUM: 136 mmol/L (ref 135–145)

## 2017-04-06 LAB — PROTIME-INR
INR: 2.08
PROTHROMBIN TIME: 23.7 s — AB (ref 11.4–15.2)

## 2017-04-06 LAB — CBC
HCT: 26.8 % — ABNORMAL LOW (ref 35.0–47.0)
Hemoglobin: 9 g/dL — ABNORMAL LOW (ref 12.0–16.0)
MCH: 28.6 pg (ref 26.0–34.0)
MCHC: 33.5 g/dL (ref 32.0–36.0)
MCV: 85.3 fL (ref 80.0–100.0)
Platelets: 150 10*3/uL (ref 150–440)
RBC: 3.14 MIL/uL — AB (ref 3.80–5.20)
RDW: 14.8 % — AB (ref 11.5–14.5)
WBC: 4.5 10*3/uL (ref 3.6–11.0)

## 2017-04-06 NOTE — Progress Notes (Signed)
Initial Nutrition Assessment  DOCUMENTATION CODES:   Not applicable  INTERVENTION:  1. Monitor for needs, cater to patient preferences  NUTRITION DIAGNOSIS:   Swallowing difficulty related to chronic illness (parkinson's) as evidenced by per patient/family report.  GOAL:   Patient will meet greater than or equal to 90% of their needs  MONITOR:   PO intake, I & O's, Labs, Weight trends  REASON FOR ASSESSMENT:   Malnutrition Screening Tool    ASSESSMENT:   This is a 76 y.o. female with a history of chronic combined systolic and diastolic heart failure, CKD4, pulmonary hypertension, CAD status post PCI, Parkinson's, diabetes, COPD, hypertension, hyperlipidemia, DVT on Coumadin, atrial fibrillation diagnosed 03/31/2017 now being admitted with A-on-C CHF  Spoke with Molly Wolf at bedside. She reports intentional weight loss due to her being "fat." Normal weight of 154# currently 151 Normal PO intake at home is oatmeal for breakfast, creamed potatoes, green beans and a meat for lunch, and a similar cooked meal for supper. She does not display any muscle wasting or fat depletions at this point in time. She does report some issues with swallowing meats. Encouraged her to cut meats into small pieces, use gravies, or consume mostly fish. ?etiology but likely is a result of parkinson's disease. No other issues at this time. PO intake in hospital has been good. No interventions warranted. Labs and medications reviewed: Protonix gtt  Diet Order:  Diet Heart Room service appropriate? Yes; Fluid consistency: Thin  Skin:  Reviewed, no issues  Last BM:  04/04/2017  Height:   Ht Readings from Last 1 Encounters:  04/03/17 5' 7.5" (1.715 m)    Weight:   Wt Readings from Last 1 Encounters:  04/06/17 151 lb (68.5 kg)    Ideal Body Weight:  62.5 kg  BMI:  Body mass index is 23.3 kg/m.  Estimated Nutritional Needs:   Kcal:  1600-1900 calories  Protein:  69-83 grams  Fluid:   1.5L  EDUCATION NEEDS:   No education needs identified at this time  Molly AnoWilliam M. Bentli Llorente, MS, RD LDN Inpatient Clinical Dietitian Pager [phone]

## 2017-04-06 NOTE — Progress Notes (Signed)
[employer] - [place] [facility]   PATIENT NAME: Molly Wolf    MR#:  [id number]  DATE OF BIRTH:  03-21-1941  SUBJECTIVE:  CHIEF COMPLAINT:   Chief Complaint  Patient presents with  . Weakness  . Melena     Has history of heart failure and A. fib and on Coumadin, for last few days and feeling some tightness in her chest and dizziness and shortness of breath. Also noted some dark stool for last 2-3 days. Feeling comfortable today.  REVIEW OF SYSTEMS:  CONSTITUTIONAL: No fever, fatigue or weakness.  EYES: No blurred or double vision.  EARS, NOSE, AND THROAT: No tinnitus or ear pain.  RESPIRATORY: No cough, shortness of breath, wheezing or hemoptysis.  CARDIOVASCULAR: No chest pain, orthopnea, edema.  GASTROINTESTINAL: No nausea, vomiting, diarrhea or abdominal pain.  GENITOURINARY: No dysuria, hematuria.  ENDOCRINE: No polyuria, nocturia,  HEMATOLOGY: No anemia, easy bruising or bleeding SKIN: No rash or lesion. MUSCULOSKELETAL: No joint pain or arthritis.   NEUROLOGIC: No tingling, numbness, weakness.  PSYCHIATRY: No anxiety or depression.   ROS  DRUG ALLERGIES:  No Known Allergies  VITALS:  Blood pressure 123/79, pulse 71, temperature 98.2 F (36.8 C), temperature source Oral, resp. rate 16, height 5' 7.5" (1.715 m), weight 68.5 kg (151 lb), SpO2 100 %.  PHYSICAL EXAMINATION:  GENERAL:  76 y.o.-year-old patient lying in the bed with no acute distress.  EYES: Pupils equal, round, reactive to light and accommodation. No scleral icterus. Extraocular muscles intact.  HEENT: Head atraumatic, normocephalic. Oropharynx and nasopharynx clear.  NECK:  Supple, no jugular venous distention. No thyroid enlargement, no tenderness.  LUNGS: Normal breath sounds bilaterally, no wheezing, some crepitation. No use of accessory muscles of respiration.  CARDIOVASCULAR: irregular S1, S2 normal. No murmurs .  ABDOMEN: Soft, nontender, nondistended. Bowel sounds present.  No organomegaly or mass.  EXTREMITIES: No pedal edema, cyanosis, or clubbing.  NEUROLOGIC: Cranial nerves II through XII are intact. Muscle strength 4/5 in all extremities. Sensation intact. Gait not checked.  PSYCHIATRIC: The patient is alert and oriented x 3.  SKIN: No obvious rash, lesion, or ulcer.   Physical Exam LABORATORY PANEL:   CBC  Recent Labs Lab 04/06/17 0501  WBC 4.5  HGB 9.0*  HCT 26.8*  PLT 150   ------------------------------------------------------------------------------------------------------------------  Chemistries   Recent Labs Lab 04/04/17 0022 04/04/17 0531  04/06/17 0501  NA  --  140  < > 136  K  --  4.4  < > 5.1  CL  --  110  < > 106  CO2  --  21*  < > 23  GLUCOSE  --  72  < > 146*  BUN  --  51*  < > 52*  CREATININE  --  2.48*  < > 2.76*  CALCIUM  --  8.4*  < > 8.0*  MG 2.1  --   --   --   AST  --  23  --   --   ALT  --  <5*  --   --   ALKPHOS  --  91  --   --   BILITOT  --  0.7  --   --   < > = values in this interval not displayed. ------------------------------------------------------------------------------------------------------------------  Cardiac Enzymes  Recent Labs Lab 04/04/17 0531 04/04/17 1123  TROPONINI 0.03* 0.03*   ------------------------------------------------------------------------------------------------------------------  RADIOLOGY:  No results found.  ASSESSMENT AND PLAN:   Active Problems:   Acute combined systolic (congestive)  and diastolic (congestive) heart failure (HCC)  This is a 76 y.o. female with a history of chronic combined systolic and diastolic heart failure, CKD4, pulmonary hypertension, CAD status post PCI, Parkinson's, diabetes, COPD, hypertension, hyperlipidemia, DVT on Coumadin, atrial fibrillation diagnosed 03/31/2017 now being admitted with:  #. Acute exacerbation of chronic combined systolic and diastolic heart failure - Continue Telemetry monitoring. - Beta blocker, diuretic,  nitrate.  - Intake/output, daily weight. - Trend troponins- negative, checked lipids and TSH- normal. - recent Echo April 2018, repeated echo shows worsening EF. - Cardiology consultation appreciated. - held lasix now, and monitor.  #. Atrial fibrillation with RVR - Rate control with metoprolol, increase the doses still was tachycardic. - INR therapeutic at 2.99 Will hold Coumadin for now due to GI bleed.  #. Acute kidney injury on CKD4 - Avoid nephrotoxic medications - Monitor with IV Lasix. Stable.  #. Anemia, ? UGIB (positive melena, guaiac negative stool - Repeat stool FOBT - IV Protonix - Serial CBC and transfuse as needed- hemoglobin stable. - Hold anticoagulants - GI consultation appreciated plan for EGD once INR is normal, check daily.  #. History of hypertension - Continue hydralazine, metoprolol, Lasix  #. History of Parkinson's - Continue Sinemet  #. History of hyperlipidemia - Continue atorvastatin  #. History of depression - Continue Lexapro  #. History of neuropathy - Continue gabapentin  #. History of diabetes now Hypoglycemia -Given Lantus at half dose -Accu-Cheks every 4 hours with regular insulin sliding scale coverage - now stopped lantus, and started in D5 NS.  blood sugar is stable, so stop D5ns now.  # Hypoglycemia    As she was kept NPO- had hypoglycemia, was on IV dextrose.    Resumed diet,     Now blood sugar is stable, stop iv Dextrose drip.  All the records are reviewed and case discussed with Care Management/Social Workerr. Management plans discussed with the patient, family and they are in agreement.  CODE STATUS: full.  TOTAL TIME TAKING CARE OF THIS PATIENT: 35 minutes.    POSSIBLE D/C IN 2-3 DAYS, DEPENDING ON CLINICAL CONDITION.   Altamese DillingVACHHANI, Selene Peltzer M.D on 04/06/2017   Between 7am to 6pm - Pager - [phone]  After 6pm go to [email or web address] - password [email or web address]  [facility] [place] [facility]  Office   [phone]  CC: Primary care physician; Darliss RidgelBergert, Jessica W, MD  Note: This dictation was prepared with Dragon dictation along with smaller phrase technology. Any transcriptional errors that result from this process are unintentional.

## 2017-04-06 NOTE — Progress Notes (Signed)
Patient Name: Molly CunasJudy Riggio Date of Encounter: 04/06/2017  Primary Cardiologist: End  Hospital Problem List     Active Problems:   Acute combined systolic (congestive) and diastolic (congestive) heart failure (HCC)     Subjective   Reports just overall not feeling well. No specific complaints, just not feeling well. Some SOB, no chest pain. Renal function continues to bump, Lasix held 6/25. + 800 mL for the admission. Weight 151 pounds today (last clinic weight of 161 pounds on 6/20). Afib reasonably controlled in the 90s to low 100s bpm. Hgb continues to down trend this morning from 9.6-->9.0.   Inpatient Medications    Scheduled Meds: . atorvastatin  20 mg Oral Daily  . Carbidopa-Levodopa ER  2 tablet Oral QID  . dextrose  1 ampule Intravenous Once  . dextrose  1 ampule Intravenous Once  . escitalopram  20 mg Oral Daily  . gabapentin  300 mg Oral Daily  . insulin aspart  0-5 Units Subcutaneous QHS  . insulin aspart  0-9 Units Subcutaneous TID WC  . isosorbide mononitrate  60 mg Oral Daily  . metoprolol tartrate  75 mg Oral BID  . mirabegron ER  25 mg Oral Daily  . [START ON 04/07/2017] pantoprazole  40 mg Intravenous Q12H  . pneumococcal 23 valent vaccine  0.5 mL Intramuscular Tomorrow-1000  . saccharomyces boulardii  250 mg Oral Daily   Continuous Infusions: . dextrose 5 % and 0.9% NaCl 75 mL/hr at 04/05/17 2227  . pantoprozole (PROTONIX) infusion 8 mg/hr (04/06/17 0842)   PRN Meds: acetaminophen, loratadine, LORazepam, nitroGLYCERIN, ondansetron (ZOFRAN) IV, polyethylene glycol, promethazine, traMADol   Vital Signs    Vitals:   04/05/17 2039 04/06/17 0409 04/06/17 0500 04/06/17 0816  BP: 131/77 (!) 151/94  126/77  Pulse:  87  (!) 107  Resp: 18 18  18   Temp: 98.6 F (37 C) 98 F (36.7 C)  98.5 F (36.9 C)  TempSrc: Oral Oral  Oral  SpO2: 98% 100%  99%  Weight:   151 lb (68.5 kg)   Height:        Intake/Output Summary (Last 24 hours) at 04/06/17  0941 Last data filed at 04/06/17 0600  Gross per 24 hour  Intake          2799.58 ml  Output             1600 ml  Net          1199.58 ml   Filed Weights   04/04/17 0435 04/05/17 0500 04/06/17 0500  Weight: 161 lb 8 oz (73.3 kg) 152 lb 6.4 oz (69.1 kg) 151 lb (68.5 kg)    Physical Exam    GEN: Frail appearing, tremor noted, in no acute distress.  HEENT: Grossly normal.  Neck: Supple, no JVD, carotid bruits, or masses. Cardiac: Irregularly irregular, II/VI systolic murmur, no rubs, or gallops. No clubbing, cyanosis, edema.  Radials/DP/PT 2+ and equal bilaterally.  Respiratory:  Respirations regular and unlabored, clear to auscultation bilaterally. GI: Soft, nontender, nondistended, BS + x 4. MS: no deformity or atrophy. Skin: warm and dry, no rash. Neuro:  Strength and sensation are intact. Psych: AAOx3.  Normal affect.  Labs    CBC  Recent Labs  04/04/17 1123 04/06/17 0501  WBC 4.7 4.5  HGB 9.6* 9.0*  HCT 28.1* 26.8*  MCV 84.1 85.3  PLT 145* 150   Basic Metabolic Panel  Recent Labs  04/03/17 1918 04/04/17 0022  04/05/17 0453 04/06/17 0501  NA 138  --   < > 136 136  K 5.0  --   < > 4.6 5.1  CL 108  --   < > 107 106  CO2 23  --   < > 24 23  GLUCOSE 158*  --   < > 41* 146*  BUN 50*  --   < > 49* 52*  CREATININE 2.53*  --   < > 2.66* 2.76*  CALCIUM 8.4*  --   < > 8.3* 8.0*  MG 2.0 2.1  --   --   --   < > = values in this interval not displayed. Liver Function Tests  Recent Labs  04/03/17 1918 04/04/17 0531  AST 16 23  ALT <5* <5*  ALKPHOS 66 91  BILITOT 0.6 0.7  PROT 6.1* 6.0*  ALBUMIN 3.4* 3.3*   No results for input(s): LIPASE, AMYLASE in the last 72 hours. Cardiac Enzymes  Recent Labs  04/04/17 0022 04/04/17 0531 04/04/17 1123  TROPONINI 0.03* 0.03* 0.03*   BNP Invalid input(s): POCBNP D-Dimer No results for input(s): DDIMER in the last 72 hours. Hemoglobin A1C  Recent Labs  04/04/17 0022  HGBA1C 7.5*   Fasting Lipid  Panel  Recent Labs  04/04/17 0531  CHOL 98  HDL 37*  LDLCALC 49  TRIG 60  CHOLHDL 2.6   Thyroid Function Tests  Recent Labs  04/04/17 0022  TSH 3.036    Telemetry    Afib, 80s to low 100s bpm - Personally Reviewed  ECG    n/a - Personally Reviewed  Radiology    No results found.  Cardiac Studies   TTE 04/04/17: Study Conclusions  - Left ventricle: LVEF appears depressed at approximately 35% with   severe hypokinesis of mid/distal anterior and anterolateral   walls.   Moderate LVH Atrial fibrillation makes evaluation of LVEF   difficult   In comparison to echo from April 2018 these findings are more   prominent and LVEF is worse.t, But in that study (April 2018)   apical windows were foreshortened making some of comparison   diffcult - Aortic valve: AV is thickened, calcified with mildly restricted   motion. There was mild regurgitation. - Mitral valve: There was moderate regurgitation. - Left atrium: The atrium was moderately dilated. - Right atrium: The atrium was mildly dilated. - Pulmonary arteries: PA peak pressure: 65 mm Hg (S). - Pericardium, extracardiac: A small pericardial effusion was   identified.  Patient Profile     76 y.o. female with history of CHF,CAD (s/p remote PCI at [facility]), COPD, CKD stage IV, Parkinson's, HL, HTN, DVT on coumadin, & atrial fibrillation who presented to [facility] ED in afib with RVR for evaluation of generalized weakness, SOB, orthopnea, black stools, and dizziness X2 days and seen by Cardiology at the request of Dr. Tonye Royalty for heart failure.   Assessment & Plan    1. Acute on chronic systolic CHF -She does continue to appear mildly dehydrated at this time with a SCr of 2.7 this morning (baseline ~ 2) -Continue to hold Lasix -Consider changing Lopressor to Toprol XL vs Coreg given her cardiomyopathy -Not on ACE/ARB/Entresto/spiro given CKD stage IV -Suspect a lot of her problems are multifactorial including  acute on chronic anemia of chronic disease, mild malnutrition   2. Persistent Afib: -Remains in Afib with rates as above -BB as above -Coumadin on hold given likely GI bleed -GI planning for EGD when able -May need to consider rhythm control strategy  -  CHADS2VASc at least 6 (CHF, HTN, age x 2, DM, female)  3. Acute on CKD stage IV: -Lasix on hold as above -Per IM -Cautious use of IV fluids given her cardiomyopathy  4. Acute on chronic anemia of chronic disease: -As above -Maintain HGB > 8.5 -Coumadin on hold  5. Elevated troponin/CAD: -No symptoms concerning for ACS -Likely supply demand ischemia -No plans for inpatient ischemic eval  6. Remaining per IM  Signed, Eula Listen, PA-C [facility] Pager: [phone] 04/06/2017, 9:41 AM

## 2017-04-06 NOTE — Progress Notes (Addendum)
Patient resting in bed at this time , please disregard previous note ,

## 2017-04-07 DIAGNOSIS — K921 Melena: Secondary | ICD-10-CM

## 2017-04-07 DIAGNOSIS — I5043 Acute on chronic combined systolic (congestive) and diastolic (congestive) heart failure: Secondary | ICD-10-CM

## 2017-04-07 LAB — PROTIME-INR
INR: 1.62
PROTHROMBIN TIME: 19.4 s — AB (ref 11.4–15.2)

## 2017-04-07 LAB — CBC
HCT: 27.8 % — ABNORMAL LOW (ref 35.0–47.0)
Hemoglobin: 9.3 g/dL — ABNORMAL LOW (ref 12.0–16.0)
MCH: 28.6 pg (ref 26.0–34.0)
MCHC: 33.3 g/dL (ref 32.0–36.0)
MCV: 85.9 fL (ref 80.0–100.0)
PLATELETS: 163 10*3/uL (ref 150–440)
RBC: 3.24 MIL/uL — AB (ref 3.80–5.20)
RDW: 14.7 % — ABNORMAL HIGH (ref 11.5–14.5)
WBC: 4.9 10*3/uL (ref 3.6–11.0)

## 2017-04-07 LAB — BASIC METABOLIC PANEL
Anion gap: 5 (ref 5–15)
BUN: 48 mg/dL — AB (ref 6–20)
CO2: 23 mmol/L (ref 22–32)
Calcium: 8.4 mg/dL — ABNORMAL LOW (ref 8.9–10.3)
Chloride: 108 mmol/L (ref 101–111)
Creatinine, Ser: 2.52 mg/dL — ABNORMAL HIGH (ref 0.44–1.00)
GFR, EST AFRICAN AMERICAN: 20 mL/min — AB (ref >60)
GFR, EST NON AFRICAN AMERICAN: 17 mL/min — AB (ref >60)
Glucose, Bld: 143 mg/dL — ABNORMAL HIGH (ref 65–99)
POTASSIUM: 5.1 mmol/L (ref 3.5–5.1)
SODIUM: 136 mmol/L (ref 135–145)

## 2017-04-07 LAB — GLUCOSE, CAPILLARY
GLUCOSE-CAPILLARY: 242 mg/dL — AB (ref 65–99)
Glucose-Capillary: 138 mg/dL — ABNORMAL HIGH (ref 65–99)
Glucose-Capillary: 147 mg/dL — ABNORMAL HIGH (ref 65–99)
Glucose-Capillary: 229 mg/dL — ABNORMAL HIGH (ref 65–99)
Glucose-Capillary: 256 mg/dL — ABNORMAL HIGH (ref 65–99)

## 2017-04-07 MED ORDER — ESCITALOPRAM OXALATE 10 MG PO TABS
10.0000 mg | ORAL_TABLET | Freq: Every day | ORAL | Status: DC
  Administered 2017-04-08 – 2017-04-09 (×2): 10 mg via ORAL
  Filled 2017-04-07 (×2): qty 1

## 2017-04-07 NOTE — Progress Notes (Signed)
Dr. Allena KatzPatel just called saying Dr. Tobi BastosAnna who is going to do the EGD bumped pt to get EGD Friday, so D/C'd NPO order after midnight & Dr. Allena KatzPatel gave orders for clear liquids.

## 2017-04-07 NOTE — Progress Notes (Signed)
[employer] - [place] at [facility]   PATIENT NAME: Molly Wolf    MR#:  [id number]  DATE OF BIRTH:  1941-07-23  SUBJECTIVE:  some dark stool for last 2-3 days. Feeling comfortable today.weak  REVIEW OF SYSTEMS:  CONSTITUTIONAL: No fever, fatigue or weakness.  EYES: No blurred or double vision.  EARS, NOSE, AND THROAT: No tinnitus or ear pain.  RESPIRATORY: No cough, shortness of breath, wheezing or hemoptysis.  CARDIOVASCULAR: No chest pain, orthopnea, edema.  GASTROINTESTINAL: No nausea, vomiting, diarrhea or abdominal pain.  GENITOURINARY: No dysuria, hematuria.  ENDOCRINE: No polyuria, nocturia,  HEMATOLOGY: No anemia, easy bruising or bleeding SKIN: No rash or lesion. MUSCULOSKELETAL: No joint pain or arthritis.   NEUROLOGIC: No tingling, numbness, weakness.  PSYCHIATRY: No anxiety or depression.   ROS  DRUG ALLERGIES:  No Known Allergies  VITALS:  Blood pressure (!) 153/79, pulse 84, temperature 97.9 F (36.6 C), temperature source Oral, resp. rate 18, height 5' 7.5" (1.715 m), weight 67.9 kg (149 lb 11.1 oz), SpO2 100 %.  PHYSICAL EXAMINATION:  GENERAL:  76 y.o.-year-old patient lying in the bed with no acute distress.  EYES: Pupils equal, round, reactive to light and accommodation. No scleral icterus. Extraocular muscles intact.  HEENT: Head atraumatic, normocephalic. Oropharynx and nasopharynx clear.  NECK:  Supple, no jugular venous distention. No thyroid enlargement, no tenderness.  LUNGS: Normal breath sounds bilaterally, no wheezing, some crepitation. No use of accessory muscles of respiration.  CARDIOVASCULAR: irregular S1, S2 normal. No murmurs .  ABDOMEN: Soft, nontender, nondistended. Bowel sounds present. No organomegaly or mass.  EXTREMITIES: No pedal edema, cyanosis, or clubbing.  NEUROLOGIC: Cranial nerves II through XII are intact. Muscle strength 4/5 in all extremities. Sensation intact. Gait not checked. weak PSYCHIATRIC: The  patient is alert and oriented x 3.  SKIN: No obvious rash, lesion, or ulcer.   Physical Exam LABORATORY PANEL:   CBC  Recent Labs Lab 04/07/17 0436  WBC 4.9  HGB 9.3*  HCT 27.8*  PLT 163   ------------------------------------------------------------------------------------------------------------------  Chemistries   Recent Labs Lab 04/04/17 0022 04/04/17 0531  04/07/17 0436  NA  --  140  < > 136  K  --  4.4  < > 5.1  CL  --  110  < > 108  CO2  --  21*  < > 23  GLUCOSE  --  72  < > 143*  BUN  --  51*  < > 48*  CREATININE  --  2.48*  < > 2.52*  CALCIUM  --  8.4*  < > 8.4*  MG 2.1  --   --   --   AST  --  23  --   --   ALT  --  <5*  --   --   ALKPHOS  --  91  --   --   BILITOT  --  0.7  --   --   < > = values in this interval not displayed. ------------------------------------------------------------------------------------------------------------------  Cardiac Enzymes  Recent Labs Lab 04/04/17 0531 04/04/17 1123  TROPONINI 0.03* 0.03*   ------------------------------------------------------------------------------------------------------------------  RADIOLOGY:  No results found.  ASSESSMENT AND PLAN:    76 y.o. female with a history of chronic combined systolic and diastolic heart failure, CKD4, pulmonary hypertension, CAD status post PCI, Parkinson's, diabetes, COPD, hypertension, hyperlipidemia, DVT on Coumadin, atrial fibrillation diagnosed 03/31/2017 now being admitted with:  #. Acute exacerbation of chronic combined systolic and diastolic heart failure - Continue Telemetry monitoring- afib -  Beta blocker, diuretic, nitrate.  - Trend troponins- negative, checked lipids and TSH- normal. - recent Echo April 2018, repeated echo shows worsening EF. - Cardiology consultation appreciated. - held lasix now, and monitor.  #. Atrial fibrillation with RVR - Rate control with metoprolol, increase the doses still was tachycardic. - INR therapeutic at  2.99---1.65 - Will hold Coumadin for now due to GI bleed.  #. Acute kidney injury on CKD4 - Avoid nephrotoxic medications - Monitor with IV Lasix. Stable.  #. Anemia, ? UGIB (positive melena, guaiac negative stool - IV Protonix - Serial CBC and transfuse as needed- hemoglobin stable. - Hold anticoagulants - GI consultation appreciated plan for EGD once INR is normal, check daily.  #. History of hypertension - Continue hydralazine, metoprolol, Lasix  #. History of Parkinson's - Continue Sinemet  #. History of hyperlipidemia - Continue atorvastatin  #. History of depression - Continue Lexapro  #. History of neuropathy - Continue gabapentin  #. History of diabetes now Hypoglycemia -Given Lantus at half dose -Accu-Cheks every 4 hours with regular insulin sliding scale coverage - now stopped lantus, and started in D5 NS.  blood sugar is stable, so stop D5ns now.  # Hypoglycemia    As she was kept NPO- had hypoglycemia, was on IV dextrose.    Resumed diet,     Now blood sugar is stable, stop iv Dextrose drip.  # d/c planning to home with HHPT  All the records are reviewed and case discussed with Care Management/Social Workerr. Management plans discussed with the patient, family and they are in agreement.  CODE STATUS: full.  TOTAL TIME TAKING CARE OF THIS PATIENT: 25 minutes.    POSSIBLE D/C IN 1-2DAYS, DEPENDING ON CLINICAL CONDITION.   Dhruva Orndoff M.D on 04/07/2017   Between 7am to 6pm - Pager - [phone]  After 6pm go to [email or web address] - password [email or web address]  [facility]  Office  [phone]  CC: Primary care physician; Darliss RidgelBergert, Jessica W, MD  Note: This dictation was prepared with Dragon dictation along with smaller phrase technology. Any transcriptional errors that result from this process are unintentional.

## 2017-04-07 NOTE — Consult Note (Signed)
Pharmacy consulted to review meds for induced effect for prolonged QTc. escitalopram is known to have a moderate QTc prolonging risk. Prolonged QTc has been seen with doses from 10-30mg /day (pt is on 20mg /day). Patient is at risk due to her cardiac disease. Could consider lowering dose or changing medication, however most antidepressants carry a intermediate to moderate risk of prolonged QTc. Must weight the benefits with the risks. Changing medications may require a taper. Consider discussing with physiatrics if this is desired. Combined with zofran this risk is increased. Patient has active orders for ondansetron, but has not received any doses. Consider switching zofran to promethazine.  Olene FlossMelissa D Bryceton Hantz, Pharm.D, BCPS Clinical Pharmacist

## 2017-04-07 NOTE — Progress Notes (Signed)
PT Cancellation Note  Patient Details Name: Molly Wolf MRN: [id number] DOB: 06/11/1941   Cancelled Treatment:    Reason Eval/Treat Not Completed: Patient declined, no reason specified.  Pt reporting her visitors just arrived (that pt has looked forward to all day) and also she felt nauseas and has not felt well all day (nursing notified).  Pt's visitors encouraged pt to participate in therapy but pt continued to decline.  Will re-attempt PT treatment at a later date/time.  Hendricks LimesEmily Akaiya Touchette, PT 04/07/17, 4:24 PM [phone]

## 2017-04-07 NOTE — Plan of Care (Signed)
Problem: Pain Managment: Goal: General experience of comfort will improve Outcome: Progressing No complaints of pain since this RN picked this pt up at 3pm. Will continue to monitor.

## 2017-04-07 NOTE — Progress Notes (Addendum)
Patient alert and oriented, afib on monitor, no complaints of pain.  No acute events on this shift.  Patient hopefully planning for EGD tomorrow if INR is stable.  Patient educated but no consent has been signed as there is currently no order in the computer.

## 2017-04-07 NOTE — Progress Notes (Signed)
Patient Name: Molly Wolf Date of Encounter: 04/07/2017  Primary Cardiologist: End  Hospital Problem List     Active Problems:   Acute combined systolic (congestive) and diastolic (congestive) heart failure (HCC)     Subjective   Feels somewhat better this morning, thuogh does not feel back to her baseline yet. No chest pain. SOB improving. Renal function improving with holding of Lasix, though still remains above her baseline. Coumadin continues to be held for possible EGD, INR 1.62 this morning. HGB stable at 9.3.   Inpatient Medications    Scheduled Meds: . atorvastatin  20 mg Oral Daily  . Carbidopa-Levodopa ER  2 tablet Oral QID  . dextrose  1 ampule Intravenous Once  . dextrose  1 ampule Intravenous Once  . escitalopram  20 mg Oral Daily  . gabapentin  300 mg Oral Daily  . insulin aspart  0-5 Units Subcutaneous QHS  . insulin aspart  0-9 Units Subcutaneous TID WC  . isosorbide mononitrate  60 mg Oral Daily  . metoprolol tartrate  75 mg Oral BID  . mirabegron ER  25 mg Oral Daily  . pantoprazole  40 mg Intravenous Q12H  . pneumococcal 23 valent vaccine  0.5 mL Intramuscular Tomorrow-1000  . saccharomyces boulardii  250 mg Oral Daily   Continuous Infusions:  PRN Meds: acetaminophen, loratadine, LORazepam, nitroGLYCERIN, ondansetron (ZOFRAN) IV, polyethylene glycol, promethazine, traMADol   Vital Signs    Vitals:   04/06/17 1142 04/06/17 1954 04/06/17 1955 04/07/17 0349  BP: 123/79 (!) 147/102 (!) 143/105 139/87  Pulse: 71 89 93 86  Resp: 16 17    Temp: 98.2 F (36.8 C) 98 F (36.7 C)  98.2 F (36.8 C)  TempSrc: Oral   Oral  SpO2: 100% 100%  97%  Weight:    149 lb 11.1 oz (67.9 kg)  Height:        Intake/Output Summary (Last 24 hours) at 04/07/17 0905 Last data filed at 04/07/17 0400  Gross per 24 hour  Intake             1970 ml  Output              675 ml  Net             1295 ml   Filed Weights   04/05/17 0500 04/06/17 0500 04/07/17 0349    Weight: 152 lb 6.4 oz (69.1 kg) 151 lb (68.5 kg) 149 lb 11.1 oz (67.9 kg)    Physical Exam    GEN: Frail appearing, in no acute distress.  HEENT: Grossly normal.  Neck: Supple, no JVD, carotid bruits, or masses. Cardiac: Irregularly irregular, II/VI systolic murmur, no rubs, or gallops. No clubbing, cyanosis, edema.  Radials/DP/PT 2+ and equal bilaterally.  Respiratory:  Respirations regular and unlabored, clear to auscultation bilaterally. GI: Soft, nontender, nondistended, BS + x 4. MS: no deformity or atrophy. Skin: warm and dry, no rash. Neuro:  Strength and sensation are intact. Tremor.  Psych: AAOx3.  Normal affect.  Labs    CBC  Recent Labs  04/06/17 0501 04/07/17 0436  WBC 4.5 4.9  HGB 9.0* 9.3*  HCT 26.8* 27.8*  MCV 85.3 85.9  PLT 150 163   Basic Metabolic Panel  Recent Labs  04/06/17 0501 04/07/17 0436  NA 136 136  K 5.1 5.1  CL 106 108  CO2 23 23  GLUCOSE 146* 143*  BUN 52* 48*  CREATININE 2.76* 2.52*  CALCIUM 8.0* 8.4*   Liver Function  Tests No results for input(s): AST, ALT, ALKPHOS, BILITOT, PROT, ALBUMIN in the last 72 hours. No results for input(s): LIPASE, AMYLASE in the last 72 hours. Cardiac Enzymes  Recent Labs  04/04/17 1123  TROPONINI 0.03*   BNP Invalid input(s): POCBNP D-Dimer No results for input(s): DDIMER in the last 72 hours. Hemoglobin A1C No results for input(s): HGBA1C in the last 72 hours. Fasting Lipid Panel No results for input(s): CHOL, HDL, LDLCALC, TRIG, CHOLHDL, LDLDIRECT in the last 72 hours. Thyroid Function Tests No results for input(s): TSH, T4TOTAL, T3FREE, THYROIDAB in the last 72 hours.  Invalid input(s): FREET3  Telemetry    Afib 80s bpm - Personally Reviewed  ECG    n/a - Personally Reviewed  Radiology    No results found.  Cardiac Studies   TTE 04/04/17: Study Conclusions  - Left ventricle: LVEF appears depressed at approximately 35% with severe hypokinesis of mid/distal anterior  and anterolateral walls. Moderate LVH Atrial fibrillation makes evaluation of LVEF difficult In comparison to echo from April 2018 these findings are more prominent and LVEF is worse.t, But in that study (April 2018) apical windows were foreshortened making some of comparison diffcult - Aortic valve: AV is thickened, calcified with mildly restricted motion. There was mild regurgitation. - Mitral valve: There was moderate regurgitation. - Left atrium: The atrium was moderately dilated. - Right atrium: The atrium was mildly dilated. - Pulmonary arteries: PA peak pressure: 65 mm Hg (S). - Pericardium, extracardiac: A small pericardial effusion was identified.  Patient Profile     76 y.o. female with history of CHF,[facility] ([facility] COPD, CKD stage IV, Parkinson's, HL, HTN, DVT on coumadin, & atrial fibrillation who presented to [facility] ED in afib with RVR for evaluation of generalized weakness, SOB, orthopnea, black stools, and dizziness X2 days and seen by Cardiology at the request of Dr. Tonye RoyaltyAlexis Hugelmeyer for heart failure.  Assessment & Plan    1. Acute on chronic systolic CHF: -She does continue to appear mildly dehydrated, though renal function is improving with a SCr of 2.5 this morning (baseline ~ 2) -Continue to hold Lasix -Consider changing Lopressor to Toprol XL vs Coreg given her cardiomyopathy -Not on ACE/ARB/Entresto/spiro given CKD stage IV -Suspect a lot of her problems are multifactorial including acute on chronic anemia of chronic disease, mild malnutrition   2. Persistent Afib: -Remains in Afib with rates as above -BB as above -Coumadin on hold given likely GI bleed -GI planning for EGD when able -May need to consider rhythm control strategy  -CHADS2VASc at least 6 (CHF, HTN, age x 2, DM, female)  3. Acute on CKD stage IV: -Lasix on hold as above -Per IM -Cautious use of IV fluids given her cardiomyopathy  4. Acute on chronic  anemia of chronic disease: -As above -Maintain HGB > 8.5 -Coumadin on hold  5. Elevated troponin/CAD: -No symptoms concerning for ACS -Likely supply demand ischemia -No plans for inpatient ischemic eval  6. Remaining per IM  Signed, Eula Listenyan Puneet Masoner, PA-C [facility] Pager: [phone] 04/07/2017, 9:05 AM

## 2017-04-08 LAB — BASIC METABOLIC PANEL
ANION GAP: 4 — AB (ref 5–15)
BUN: 51 mg/dL — ABNORMAL HIGH (ref 6–20)
CALCIUM: 8.5 mg/dL — AB (ref 8.9–10.3)
CO2: 25 mmol/L (ref 22–32)
Chloride: 108 mmol/L (ref 101–111)
Creatinine, Ser: 2.6 mg/dL — ABNORMAL HIGH (ref 0.44–1.00)
GFR, EST AFRICAN AMERICAN: 19 mL/min — AB (ref >60)
GFR, EST NON AFRICAN AMERICAN: 17 mL/min — AB (ref >60)
GLUCOSE: 186 mg/dL — AB (ref 65–99)
Potassium: 5.1 mmol/L (ref 3.5–5.1)
Sodium: 137 mmol/L (ref 135–145)

## 2017-04-08 LAB — VITAMIN B12: Vitamin B-12: 355 pg/mL (ref 180–914)

## 2017-04-08 LAB — GLUCOSE, CAPILLARY
GLUCOSE-CAPILLARY: 159 mg/dL — AB (ref 65–99)
GLUCOSE-CAPILLARY: 181 mg/dL — AB (ref 65–99)
Glucose-Capillary: 175 mg/dL — ABNORMAL HIGH (ref 65–99)
Glucose-Capillary: 255 mg/dL — ABNORMAL HIGH (ref 65–99)
Glucose-Capillary: 171 mg/dL — ABNORMAL HIGH (ref 65–99)

## 2017-04-08 LAB — PROTIME-INR
INR: 1.48
Prothrombin Time: 18.1 seconds — ABNORMAL HIGH (ref 11.4–15.2)

## 2017-04-08 MED ORDER — INSULIN GLARGINE 100 UNIT/ML ~~LOC~~ SOLN
20.0000 [IU] | Freq: Every day | SUBCUTANEOUS | Status: DC
  Administered 2017-04-08: 20 [IU] via SUBCUTANEOUS
  Filled 2017-04-08 (×2): qty 0.2

## 2017-04-08 MED ORDER — SODIUM CHLORIDE 0.9 % IV SOLN
INTRAVENOUS | Status: DC
  Administered 2017-04-08: 16:00:00 via INTRAVENOUS

## 2017-04-08 NOTE — Progress Notes (Signed)
   Molly MoodKiran Keandra Medero MD, MRCP(U.K) [address]  [address]  [place], [place] [address]  Main: [phone]    Molly Wolf is being followed for GI bleed   Subjective: No more melena, feels ok    Objective: Vital signs in last 24 hours: Vitals:   04/07/17 1213 04/07/17 2020 04/08/17 0421 04/08/17 1356  BP: (!) 153/79 (!) 143/90 (!) 129/93 (!) 130/98  Pulse: 84 87 96 (!) 56  Resp: 18 20 20 20   Temp: 97.9 F (36.6 C) 97.7 F (36.5 C) 98 F (36.7 C) 97.5 F (36.4 C)  TempSrc: Oral Oral Oral Oral  SpO2: 100% 99% 98% 98%  Weight:   152 lb (68.9 kg)   Height:       Weight change: 2 lb 4.9 oz (1.047 kg)  Intake/Output Summary (Last 24 hours) at 04/08/17 1433 Last data filed at 04/08/17 1354  Gross per 24 hour  Intake             1560 ml  Output             1100 ml  Net              460 ml     Exam: Heart:: S1S2 present, irregularly irregular,no murmurs  Lungs: air entery b/l equal , few crackles in the left infra scapular region  Abdomen: soft, nontender, normal bowel sounds   Lab Results: @LABTEST2 @ Micro Results: No results found for this or any previous visit (from the past 240 hour(s)). Studies/Results: No results found. Medications: I have reviewed the patient's current medications. Scheduled Meds: . atorvastatin  20 mg Oral Daily  . Carbidopa-Levodopa ER  2 tablet Oral QID  . escitalopram  10 mg Oral Daily  . gabapentin  300 mg Oral Daily  . insulin aspart  0-5 Units Subcutaneous QHS  . insulin aspart  0-9 Units Subcutaneous TID WC  . insulin glargine  20 Units Subcutaneous QHS  . isosorbide mononitrate  60 mg Oral Daily  . metoprolol tartrate  75 mg Oral BID  . mirabegron ER  25 mg Oral Daily  . pantoprazole  40 mg Intravenous Q12H  . pneumococcal 23 valent vaccine  0.5 mL Intramuscular Tomorrow-1000  . saccharomyces boulardii  250 mg Oral Daily   Continuous Infusions: PRN Meds:.acetaminophen, loratadine, LORazepam, nitroGLYCERIN, polyethylene  glycol, promethazine, traMADol  CBC Latest Ref Rng & Units 04/07/2017 04/06/2017 04/04/2017  WBC 3.6 - 11.0 K/uL 4.9 4.5 4.7  Hemoglobin 12.0 - 16.0 g/dL 4.1(L9.3(L) 2.4(M9.0(L) 0.1(U9.6(L)  Hematocrit 35.0 - 47.0 % 27.8(L) 26.8(L) 28.1(L)  Platelets 150 - 440 K/uL 163 150 145(L)    Assessment: Active Problems:   Acute combined systolic (congestive) and diastolic (congestive) heart failure (HCC)  Molly Wolf 76 y.o. female with h/o CAD ,A fibb on coumadin admitted with tarry black stools for 2 days duration , shortness of breath.On admisison was in Afibb with RVR, pulmonary edema  . On admission Hb 9.6 with MCV 84. Hb was 10.9 in 02/2017 . Today 9.3 grams . Off coumadin during hospitalization.   Plan: 1. Continue PPI 2. EGD in am   I have discussed alternative options, risks & benefits,  which include, but are not limited to, bleeding, infection, perforation,respiratory complication & drug reaction.  The patient agrees with this plan & written consent will be obtained.       LOS: 5 days   Molly MoodKiran Alexas Wolf 04/08/2017, 2:33 PM

## 2017-04-08 NOTE — Progress Notes (Signed)
Physical Therapy Treatment Patient Details Name: Molly CunasJudy Krygier MRN: [id number] DOB: 16-Jan-1941 Today's Date: 04/08/2017    History of Present Illness Pt is a 76 y.o. female presenting to hospital with generalized weakness and dizziness x2 days and SOB; pt also with black stools.  Pt admitted with acute exacerbation of chronic combined systolic and diastolic heart failure, a-fib with RVR, AKI, and anemia.  PMH includes CHF, CKD, CAD, COPD, DM, DVT, back and neck surgery, and Parkinson's disease.    PT Comments    Pt overall appeared more fatigued today but able to progress to ambulating 120 feet with RW CGA (tremors noted but no loss of balance during session).  Session limited d/t pt fatigue.  Will continue to progress pt with strengthening, activity tolerance, and increasing ambulation distance per pt tolerance.  Recommend HHPT and SBA for functional mobility for safety upon discharge home.    Follow Up Recommendations  Home health PT;Supervision for mobility/OOB     Equipment Recommendations  Rolling walker with 5" wheels    Recommendations for Other Services       Precautions / Restrictions Precautions Precautions: Fall Restrictions Weight Bearing Restrictions: No    Mobility  Bed Mobility Overal bed mobility: Needs Assistance Bed Mobility: Supine to Sit     Supine to sit: Supervision;HOB elevated     General bed mobility comments: mild increased effort to perform; use of bed rail  Transfers Overall transfer level: Needs assistance Equipment used: Rolling walker (2 wheeled) Transfers: Sit to/from Stand Sit to Stand: Min guard         General transfer comment: mild increased effort to stand but steady  Ambulation/Gait Ambulation/Gait assistance: Min guard Ambulation Distance (Feet):  (120 feet in hallway; 20 feet bed to opposite side of bed (to chair)) Assistive device: Rolling walker (2 wheeled)   Gait velocity: decreased   General Gait Details: mild  decreased B step length; mildly shaky but otherwise steady without loss of balance   Stairs            Wheelchair Mobility    Modified Rankin (Stroke Patients Only)       Balance Overall balance assessment: Needs assistance Sitting-balance support: Bilateral upper extremity supported;Feet supported Sitting balance-Leahy Scale: Good Sitting balance - Comments: sitting reaching within BOS   Standing balance support: Bilateral upper extremity supported (on RW) Standing balance-Leahy Scale: Good Standing balance comment: with ambulation                            Cognition Arousal/Alertness: Awake/alert Behavior During Therapy: WFL for tasks assessed/performed Overall Cognitive Status: Within Functional Limits for tasks assessed                                        Exercises      General Comments General comments (skin integrity, edema, etc.): Pt sleeping in bed upon PT entry.  Nursing cleared pt for participation in physical therapy.  Pt agreeable to PT session.      Pertinent Vitals/Pain Pain Assessment: No/denies pain  Vitals (HR and O2 on 2 L via nasal cannula) stable and WFL throughout treatment session.    Home Living                      Prior Function  PT Goals (current goals can now be found in the care plan section) Acute Rehab PT Goals Patient Stated Goal: to go home PT Goal Formulation: With patient Time For Goal Achievement: 04/19/17 Potential to Achieve Goals: Good Progress towards PT goals: Progressing toward goals    Frequency    Min 2X/week      PT Plan Current plan remains appropriate    Co-evaluation              AM-PAC PT "6 Clicks" Daily Activity  Outcome Measure  Difficulty turning over in bed (including adjusting bedclothes, sheets and blankets)?: A Little Difficulty moving from lying on back to sitting on the side of the bed? : A Little Difficulty sitting down on and  standing up from a chair with arms (e.g., wheelchair, bedside commode, etc,.)?: A Little Help needed moving to and from a bed to chair (including a wheelchair)?: A Little Help needed walking in hospital room?: A Little Help needed climbing 3-5 steps with a railing? : A Lot 6 Click Score: 17    End of Session Equipment Utilized During Treatment: Gait belt;Oxygen (2 L O2 via nasal cannula) Activity Tolerance: Patient limited by fatigue Patient left: in chair;with call bell/phone within reach;with chair alarm set Nurse Communication: Mobility status;Precautions PT Visit Diagnosis: Other abnormalities of gait and mobility (R26.89);Muscle weakness (generalized) (M62.81);History of falling (Z91.81)     Time: 1610-9604 PT Time Calculation (min) (ACUTE ONLY): 17 min  Charges:  $Therapeutic Activity: 8-22 mins                    G CodesHendricks Limes, PT 04/08/17, 3:36 PM [phone]

## 2017-04-08 NOTE — Progress Notes (Signed)
[employer] -  at [facility]   PATIENT NAME: Molly CunasJudy Malecha    MR#:  [id number]  DATE OF BIRTH:  July 06, 1941  SUBJECTIVE:  some dark stool for last 2-3 days. Feeling comfortable today.weak dter in the room  REVIEW OF SYSTEMS:   Review of Systems  Constitutional: Negative for chills, fever and weight loss.  HENT: Negative for ear discharge, ear pain and nosebleeds.   Eyes: Negative for blurred vision, pain and discharge.  Respiratory: Negative for sputum production, shortness of breath, wheezing and stridor.   Cardiovascular: Negative for chest pain, palpitations, orthopnea and PND.  Gastrointestinal: Negative for abdominal pain, diarrhea, nausea and vomiting.  Genitourinary: Negative for frequency and urgency.  Musculoskeletal: Negative for back pain and joint pain.  Neurological: Positive for weakness. Negative for sensory change, speech change and focal weakness.  Psychiatric/Behavioral: Negative for depression and hallucinations. The patient is not nervous/anxious.     DRUG ALLERGIES:  No Known Allergies  VITALS:  Blood pressure (!) 129/93, pulse 96, temperature 98 F (36.7 C), temperature source Oral, resp. rate 20, height 5' 7.5" (1.715 m), weight 68.9 kg (152 lb), SpO2 98 %.  PHYSICAL EXAMINATION:  GENERAL:  76 y.o.-year-old patient lying in the bed with no acute distress.  EYES: Pupils equal, round, reactive to light and accommodation. No scleral icterus. Extraocular muscles intact.  HEENT: Head atraumatic, normocephalic. Oropharynx and nasopharynx clear.  NECK:  Supple, no jugular venous distention. No thyroid enlargement, no tenderness.  LUNGS: Normal breath sounds bilaterally, no wheezing, some crepitation. No use of accessory muscles of respiration.  CARDIOVASCULAR: irregular S1, S2 normal. No murmurs .  ABDOMEN: Soft, nontender, nondistended. Bowel sounds present. No organomegaly or mass.  EXTREMITIES: No pedal edema, cyanosis, or clubbing.   NEUROLOGIC: Cranial nerves II through XII are intact. Muscle strength 4/5 in all extremities. Sensation intact. Gait not checked. weak PSYCHIATRIC: The patient is alert and oriented x 3.  SKIN: No obvious rash, lesion, or ulcer.   Physical Exam LABORATORY PANEL:   CBC  Recent Labs Lab 04/07/17 0436  WBC 4.9  HGB 9.3*  HCT 27.8*  PLT 163   ------------------------------------------------------------------------------------------------------------------  Chemistries   Recent Labs Lab 04/04/17 0022 04/04/17 0531  04/08/17 0347  NA  --  140  < > 137  K  --  4.4  < > 5.1  CL  --  110  < > 108  CO2  --  21*  < > 25  GLUCOSE  --  72  < > 186*  BUN  --  51*  < > 51*  CREATININE  --  2.48*  < > 2.60*  CALCIUM  --  8.4*  < > 8.5*  MG 2.1  --   --   --   AST  --  23  --   --   ALT  --  <5*  --   --   ALKPHOS  --  91  --   --   BILITOT  --  0.7  --   --   < > = values in this interval not displayed. ------------------------------------------------------------------------------------------------------------------  Cardiac Enzymes  Recent Labs Lab 04/04/17 0531 04/04/17 1123  TROPONINI 0.03* 0.03*   ------------------------------------------------------------------------------------------------------------------  RADIOLOGY:  No results found.  ASSESSMENT AND PLAN:    76 y.o. female with a history of chronic combined systolic and diastolic heart failure, CKD4, pulmonary hypertension, CAD status post PCI, Parkinson's, diabetes, COPD, hypertension, hyperlipidemia, DVT on Coumadin, atrial fibrillation diagnosed 03/31/2017 now being  admitted with:  #. Acute exacerbation of chronic combined systolic and diastolic heart failure - Continue Telemetry monitoring- afib - Beta blocker, diuretic, nitrate.  - Trend troponins- negative, checked lipids and TSH- normal. - recent Echo April 2018, repeated echo shows worsening EF. - Cardiology consultation appreciated. - held lasix  now, and monitor... Remains stable -assess for home oxygen  #. Atrial fibrillation with RVR - Rate control with metoprolol, increase the doses still was tachycardic. - INR therapeutic at 2.99---1.65--1.48 - Will hold Coumadin for now due to GI bleed.  #. Acute kidney injury on CKD4 - Avoid nephrotoxic medications - Monitor with IV Lasix. Stable.  #. Anemia, ? UGIB (positive melena, guaiac negative stool - IV Protonix gtt - Serial CBC and transfuse as needed- hemoglobin stable. - Hold anticoagulants - GI consultation appreciated plan for EGD tomorrow  #. History of hypertension - Continue hydralazine, metoprolol, Lasix  #. History of Parkinson's - Continue Sinemet  #. History of hyperlipidemia - Continue atorvastatin  #. History of depression - Continue Lexapro  #. History of neuropathy - Continue gabapentin  #. History of diabetes  -resume lantus and ssi  # d/c planning to home with HHPT  All the records are reviewed and case discussed with Care Management/Social Workerr. Management plans discussed with the patient, family and they are in agreement.  CODE STATUS: full.  TOTAL TIME TAKING CARE OF THIS PATIENT: 25 minutes.    POSSIBLE D/C IN 1-2DAYS, DEPENDING ON CLINICAL CONDITION.   Chasmine Lender M.D on 04/08/2017   Between 7am to 6pm - Pager - [phone]  After 6pm go to [email or web address] - password [employer]  [facility]  Office  [phone]  CC: Primary care physician; Darliss Ridgel, MD  Note: This dictation was prepared with Dragon dictation along with smaller phrase technology. Any transcriptional errors that result from this process are unintentional.

## 2017-04-08 NOTE — Progress Notes (Signed)
A&O. Up with assist to bedside commode. No complaints during the night. UGI pending. Afib on tele, rate controlled.

## 2017-04-08 NOTE — Progress Notes (Signed)
Inpatient Diabetes Program Recommendations  AACE/ADA: New Consensus Statement on Inpatient Glycemic Control (2015)  Target Ranges:  Prepandial:   less than 140 mg/dL      Peak postprandial:   less than 180 mg/dL (1-2 hours)      Critically ill patients:  140 - 180 mg/dL   Lab Results  Component Value Date   GLUCAP 159 (H) 04/08/2017   HGBA1C 7.5 (H) 04/04/2017    Review of Glycemic Control  Results for [name], Molly (MRN [id number]) as of 04/08/2017 10:07  Ref. Range 04/07/2017 11:33 04/07/2017 16:24 04/07/2017 21:18 04/08/2017 04:20 04/08/2017 07:37  Glucose-Capillary Latest Ref Range: 65 - 99 mg/dL 409229 (H) 811242 (H) 914256 (H) 175 (H) 159 (H)    Diabetes history: DM2 Outpatient Diabetes medications: Lantus 40 units daily, Humalog 2-3 units TID with meals  Current orders for Inpatient glycemic control: Novolog 0-9 units TID with meals, Novolog 0-5 units QHS  Inpatient Diabetes Program Recommendations:  * see note from inpatient diabetes dated 04/05/17   Consider starting Lantus 10 units qam- continue Novolog correction as ordered.   Susette RacerJulie Keano [place]ug[place], RN, BA, MHA, CDE Diabetes Coordinator Inpatient Diabetes Program  [phone] (Team Pager) [phone] [facility] Office) 04/08/2017 10:13 AM

## 2017-04-09 ENCOUNTER — Emergency Department: Payer: Medicare Other

## 2017-04-09 ENCOUNTER — Encounter: Payer: Self-pay | Admitting: Anesthesiology

## 2017-04-09 ENCOUNTER — Inpatient Hospital Stay: Payer: Medicare Other | Admitting: Anesthesiology

## 2017-04-09 ENCOUNTER — Encounter
Admission: EM | Disposition: A | Discharge status: Home Health | Payer: Self-pay | Source: Home / Self Care | Attending: Internal Medicine

## 2017-04-09 ENCOUNTER — Inpatient Hospital Stay (HOSPITAL_COMMUNITY)
Admission: EM | Admit: 2017-04-09 | Discharge: 2017-04-27 | Disposition: A | Discharge status: Skilled Nursing Facility | Payer: Medicare Other | Source: Home / Self Care | Attending: Internal Medicine | Admitting: Internal Medicine

## 2017-04-09 DIAGNOSIS — J189 Pneumonia, unspecified organism: Secondary | ICD-10-CM

## 2017-04-09 DIAGNOSIS — T503X5A Adverse effect of electrolytic, caloric and water-balance agents, initial encounter: Secondary | ICD-10-CM

## 2017-04-09 DIAGNOSIS — D631 Anemia in chronic kidney disease: Secondary | ICD-10-CM

## 2017-04-09 DIAGNOSIS — R29810 Facial weakness: Secondary | ICD-10-CM | POA: Diagnosis present

## 2017-04-09 DIAGNOSIS — J440 Chronic obstructive pulmonary disease with acute lower respiratory infection: Secondary | ICD-10-CM | POA: Diagnosis present

## 2017-04-09 DIAGNOSIS — I5041 Acute combined systolic (congestive) and diastolic (congestive) heart failure: Secondary | ICD-10-CM

## 2017-04-09 DIAGNOSIS — T502X5A Adverse effect of carbonic-anhydrase inhibitors, benzothiadiazides and other diuretics, initial encounter: Secondary | ICD-10-CM

## 2017-04-09 DIAGNOSIS — I272 Pulmonary hypertension, unspecified: Secondary | ICD-10-CM

## 2017-04-09 DIAGNOSIS — Z7901 Long term (current) use of anticoagulants: Secondary | ICD-10-CM

## 2017-04-09 DIAGNOSIS — J441 Chronic obstructive pulmonary disease with (acute) exacerbation: Secondary | ICD-10-CM

## 2017-04-09 DIAGNOSIS — I083 Combined rheumatic disorders of mitral, aortic and tricuspid valves: Secondary | ICD-10-CM

## 2017-04-09 DIAGNOSIS — R944 Abnormal results of kidney function studies: Secondary | ICD-10-CM

## 2017-04-09 DIAGNOSIS — I482 Chronic atrial fibrillation: Secondary | ICD-10-CM

## 2017-04-09 DIAGNOSIS — T45515A Adverse effect of anticoagulants, initial encounter: Secondary | ICD-10-CM | POA: Diagnosis present

## 2017-04-09 DIAGNOSIS — I429 Cardiomyopathy, unspecified: Secondary | ICD-10-CM | POA: Diagnosis present

## 2017-04-09 DIAGNOSIS — G9341 Metabolic encephalopathy: Secondary | ICD-10-CM | POA: Diagnosis present

## 2017-04-09 DIAGNOSIS — E785 Hyperlipidemia, unspecified: Secondary | ICD-10-CM | POA: Diagnosis present

## 2017-04-09 DIAGNOSIS — Z86711 Personal history of pulmonary embolism: Secondary | ICD-10-CM

## 2017-04-09 DIAGNOSIS — Z841 Family history of disorders of kidney and ureter: Secondary | ICD-10-CM

## 2017-04-09 DIAGNOSIS — I248 Other forms of acute ischemic heart disease: Secondary | ICD-10-CM | POA: Diagnosis present

## 2017-04-09 DIAGNOSIS — Z79899 Other long term (current) drug therapy: Secondary | ICD-10-CM

## 2017-04-09 DIAGNOSIS — Y95 Nosocomial condition: Secondary | ICD-10-CM

## 2017-04-09 DIAGNOSIS — F419 Anxiety disorder, unspecified: Secondary | ICD-10-CM | POA: Diagnosis present

## 2017-04-09 DIAGNOSIS — M7981 Nontraumatic hematoma of soft tissue: Secondary | ICD-10-CM

## 2017-04-09 DIAGNOSIS — Z86718 Personal history of other venous thrombosis and embolism: Secondary | ICD-10-CM

## 2017-04-09 DIAGNOSIS — E1122 Type 2 diabetes mellitus with diabetic chronic kidney disease: Secondary | ICD-10-CM

## 2017-04-09 DIAGNOSIS — N170 Acute kidney failure with tubular necrosis: Secondary | ICD-10-CM | POA: Diagnosis present

## 2017-04-09 DIAGNOSIS — J9601 Acute respiratory failure with hypoxia: Secondary | ICD-10-CM | POA: Diagnosis present

## 2017-04-09 DIAGNOSIS — N186 End stage renal disease: Secondary | ICD-10-CM

## 2017-04-09 DIAGNOSIS — Z515 Encounter for palliative care: Secondary | ICD-10-CM

## 2017-04-09 DIAGNOSIS — I5043 Acute on chronic combined systolic (congestive) and diastolic (congestive) heart failure: Secondary | ICD-10-CM

## 2017-04-09 DIAGNOSIS — K521 Toxic gastroenteritis and colitis: Secondary | ICD-10-CM | POA: Diagnosis not present

## 2017-04-09 DIAGNOSIS — G20 Parkinson's disease: Secondary | ICD-10-CM

## 2017-04-09 DIAGNOSIS — Z7189 Other specified counseling: Secondary | ICD-10-CM

## 2017-04-09 DIAGNOSIS — I132 Hypertensive heart and chronic kidney disease with heart failure and with stage 5 chronic kidney disease, or end stage renal disease: Secondary | ICD-10-CM | POA: Diagnosis present

## 2017-04-09 DIAGNOSIS — R410 Disorientation, unspecified: Secondary | ICD-10-CM

## 2017-04-09 DIAGNOSIS — E875 Hyperkalemia: Secondary | ICD-10-CM | POA: Diagnosis present

## 2017-04-09 DIAGNOSIS — Z8249 Family history of ischemic heart disease and other diseases of the circulatory system: Secondary | ICD-10-CM

## 2017-04-09 DIAGNOSIS — E876 Hypokalemia: Secondary | ICD-10-CM

## 2017-04-09 DIAGNOSIS — I481 Persistent atrial fibrillation: Secondary | ICD-10-CM | POA: Diagnosis present

## 2017-04-09 DIAGNOSIS — Z794 Long term (current) use of insulin: Secondary | ICD-10-CM

## 2017-04-09 DIAGNOSIS — Z955 Presence of coronary angioplasty implant and graft: Secondary | ICD-10-CM

## 2017-04-09 DIAGNOSIS — I959 Hypotension, unspecified: Secondary | ICD-10-CM

## 2017-04-09 DIAGNOSIS — N184 Chronic kidney disease, stage 4 (severe): Secondary | ICD-10-CM

## 2017-04-09 DIAGNOSIS — F329 Major depressive disorder, single episode, unspecified: Secondary | ICD-10-CM

## 2017-04-09 DIAGNOSIS — R109 Unspecified abdominal pain: Secondary | ICD-10-CM

## 2017-04-09 HISTORY — PX: ESOPHAGOGASTRODUODENOSCOPY: SHX5428

## 2017-04-09 LAB — CBC WITH DIFFERENTIAL/PLATELET
BASOS ABS: 0.1 10*3/uL (ref 0–0.1)
BASOS PCT: 1 %
EOS PCT: 3 %
Eosinophils Absolute: 0.1 10*3/uL (ref 0–0.7)
HCT: 29.8 % — ABNORMAL LOW (ref 35.0–47.0)
Hemoglobin: 10 g/dL — ABNORMAL LOW (ref 12.0–16.0)
Lymphocytes Relative: 21 %
Lymphs Abs: 1 10*3/uL (ref 1.0–3.6)
MCH: 29 pg (ref 26.0–34.0)
MCHC: 33.6 g/dL (ref 32.0–36.0)
MCV: 86.4 fL (ref 80.0–100.0)
MONO ABS: 0.3 10*3/uL (ref 0.2–0.9)
MONOS PCT: 7 %
Neutro Abs: 3.3 10*3/uL (ref 1.4–6.5)
Neutrophils Relative %: 68 %
PLATELETS: 187 10*3/uL (ref 150–440)
RBC: 3.45 MIL/uL — ABNORMAL LOW (ref 3.80–5.20)
RDW: 14.8 % — AB (ref 11.5–14.5)
WBC: 4.9 10*3/uL (ref 3.6–11.0)

## 2017-04-09 LAB — GLUCOSE, CAPILLARY
Glucose-Capillary: 94 mg/dL (ref 65–99)
Glucose-Capillary: 95 mg/dL (ref 65–99)
Glucose-Capillary: 146 mg/dL — ABNORMAL HIGH (ref 65–99)

## 2017-04-09 LAB — SALICYLATE LEVEL

## 2017-04-09 LAB — PROTIME-INR
INR: 1.54
PROTHROMBIN TIME: 18.6 s — AB (ref 11.4–15.2)

## 2017-04-09 LAB — TROPONIN I

## 2017-04-09 LAB — COMPREHENSIVE METABOLIC PANEL
ALBUMIN: 3.4 g/dL — AB (ref 3.5–5.0)
ALT: <5 U/L — ABNORMAL LOW (ref 14–54)
ANION GAP: 9 (ref 5–15)
AST: 23 U/L (ref 15–41)
Alkaline Phosphatase: 63 U/L (ref 38–126)
BUN: 47 mg/dL — AB (ref 6–20)
CHLORIDE: 106 mmol/L (ref 101–111)
CO2: 22 mmol/L (ref 22–32)
Calcium: 8.9 mg/dL (ref 8.9–10.3)
Creatinine, Ser: 2.53 mg/dL — ABNORMAL HIGH (ref 0.44–1.00)
GFR calc Af Amer: 20 mL/min — ABNORMAL LOW (ref >60)
GFR, EST NON AFRICAN AMERICAN: 17 mL/min — AB (ref >60)
Glucose, Bld: 165 mg/dL — ABNORMAL HIGH (ref 65–99)
Potassium: 5.6 mmol/L — ABNORMAL HIGH (ref 3.5–5.1)
Sodium: 137 mmol/L (ref 135–145)
TOTAL PROTEIN: 6.5 g/dL (ref 6.5–8.1)
Total Bilirubin: 1.1 mg/dL (ref 0.3–1.2)

## 2017-04-09 LAB — ACETAMINOPHEN LEVEL

## 2017-04-09 LAB — VITAMIN D 25 HYDROXY (VIT D DEFICIENCY, FRACTURES): Vit D, 25-Hydroxy: 34.4 ng/mL (ref 30.0–100.0)

## 2017-04-09 LAB — ETHANOL: Alcohol, Ethyl (B): <5 mg/dL (ref <5)

## 2017-04-09 SURGERY — EGD (ESOPHAGOGASTRODUODENOSCOPY)
Anesthesia: General

## 2017-04-09 MED ORDER — WARFARIN - PHARMACIST DOSING INPATIENT: Freq: Every day | Status: DC

## 2017-04-09 MED ORDER — PROPOFOL 500 MG/50ML IV EMUL
INTRAVENOUS | Status: DC | PRN
  Administered 2017-04-09: 150 ug/kg/min via INTRAVENOUS

## 2017-04-09 MED ORDER — SODIUM CHLORIDE 0.9 % IV BOLUS (SEPSIS)
1000.0000 mL | Freq: Once | INTRAVENOUS | Status: AC
  Administered 2017-04-09: 1000 mL via INTRAVENOUS

## 2017-04-09 MED ORDER — PROPOFOL 10 MG/ML IV BOLUS
INTRAVENOUS | Status: DC | PRN
  Administered 2017-04-09: 40 mg via INTRAVENOUS

## 2017-04-09 MED ORDER — WARFARIN SODIUM 3 MG PO TABS: 3.0000 mg | ORAL_TABLET | Freq: Every day | ORAL | 1 refills | Status: DC

## 2017-04-09 MED ORDER — WARFARIN SODIUM 2 MG PO TABS: 2.0000 mg | ORAL_TABLET | ORAL | 0 refills | Status: DC

## 2017-04-09 MED ORDER — WARFARIN SODIUM 3 MG PO TABS
3.0000 mg | ORAL_TABLET | ORAL | Status: DC
  Administered 2017-04-09: 3 mg via ORAL
  Filled 2017-04-09: qty 1

## 2017-04-09 MED ORDER — ONDANSETRON HCL 4 MG/2ML IJ SOLN
INTRAMUSCULAR | Status: AC
  Filled 2017-04-09: qty 2

## 2017-04-09 MED ORDER — WARFARIN SODIUM 2 MG PO TABS: 2.0000 mg | ORAL_TABLET | ORAL | Status: DC

## 2017-04-09 MED ORDER — LIDOCAINE 2% (20 MG/ML) 5 ML SYRINGE
INTRAMUSCULAR | Status: DC | PRN
  Administered 2017-04-09: 60 mg via INTRAVENOUS

## 2017-04-09 MED ORDER — PROPOFOL 500 MG/50ML IV EMUL
INTRAVENOUS | Status: AC
  Filled 2017-04-09: qty 50

## 2017-04-09 NOTE — ED Notes (Signed)
Patient transported to CT 

## 2017-04-09 NOTE — Progress Notes (Signed)
SATURATION QUALIFICATIONS: (This note is used to comply with regulatory documentation for home oxygen) These sats were taken during a coughing spell after getting back in bed.  "spell lasted about 10 minutes"  Patient Saturations on Room Air at Rest = 86%  Patient Saturations on Room Air while Ambulating =  Patient Saturations on  Liters of oxygen while Ambulating =   Please briefly explain why patient needs home oxygen: Patient may need home oxygen prn for times she has difficulty breathing.  Cardiomegaly likely impeding lung expansion on the left.
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
## 2017-04-09 NOTE — Op Note (Signed)
[facility] Gastroenterology Patient Name: Molly Wolf Procedure Date: 04/09/2017 10:26 AM MRN: [id number] Account #: [id number] Date of Birth: 04/10/1941 Admit Type: Inpatient Age: [age] [facility] ENDO ROOM 4 Gender: Female Note Status: Finalized Procedure:            Upper GI endoscopy Indications:          Acute post hemorrhagic anemia Providers:            Wyline MoodKiran Calieb Lichtman MD, MD Referring MD:         No Local Md, MD (Referring MD) Medicines:            Monitored Anesthesia Care Complications:        No immediate complications. Procedure:            Pre-Anesthesia Assessment:                       - Prior to the procedure, a History and Physical was                        performed, and patient medications, allergies and                        sensitivities were reviewed. The patient's tolerance of                        previous anesthesia was reviewed.                       - The risks and benefits of the procedure and the                        sedation options and risks were discussed with the                        patient. All questions were answered and informed                        consent was obtained.                       - ASA Grade Assessment: III - A patient with severe                        systemic disease.                       After obtaining informed consent, the endoscope was                        passed under direct vision. Throughout the procedure,                        the patient's blood pressure, pulse, and oxygen                        saturations were monitored continuously. The Endoscope                        was introduced through the mouth, and advanced to the  third part of duodenum. The upper GI endoscopy was                        accomplished with ease. The patient tolerated the                        procedure well. Findings:      The esophagus was normal.      The stomach was normal.      The  examined duodenum was normal. Impression:           - Normal esophagus.                       - Normal stomach.                       - Normal examined duodenum.                       - No specimens collected. Recommendation:       - Return patient to hospital ward for ongoing care.                       - Advance diet as tolerated.                       - Continue present medications.                       - 1. No evidence of a lesion that would cause melena,                        could have been a small ulcer that has already healed                        or could still be a lesion in the colon                       2. Monitor CBC and bowel movements. Patients says she                        does not see the color of her stools therefore am                        unsure if she does or does not have any further melena.                       3. If there is a concern for ongoing bleed then would                        need a tagged RBC scan/colonoscopy.                       4. If no further evidence of bleed then can be                        discharged from GI point of view and follow up with PCP  to monitor for signs of bleeding . Due to her poor                        state of health risks of a procedure > benefits at this                        time. Procedure Code(s):    --- Professional ---                       [phone], Esophagogastroduodenoscopy, flexible, transoral;                        diagnostic, including collection of specimen(s) by                        brushing or washing, when performed (separate procedure) Diagnosis Code(s):    --- Professional ---                       D62, Acute posthemorrhagic anemia CPT copyright 2016 American Medical Association. All rights reserved. The codes documented in this report are preliminary and upon coder review may  be revised to meet current compliance requirements. Wyline Mood, MD Wyline Mood MD, MD 04/09/2017  10:46:24 AM This report has been signed electronically. Number of Addenda: 0 Note Initiated On: 04/09/2017 10:26 AM      [facility]

## 2017-04-09 NOTE — Progress Notes (Signed)
After getting back into bed after going to the backroom she got very short of breath.  I put the oxygen back on.O2 sats 87%. Lungs sounds unchanged from this morning.  Unsure what was the cause.  Had non productive coughing.   When she settled down her O2 sats on room air were 86%.

## 2017-04-09 NOTE — ED Provider Notes (Signed)
[facility] Emergency Department Provider Note   First MD Initiated Contact with Patient 04/09/17 2304     (approximate)  I have reviewed the triage vital signs and the nursing notes.   HISTORY  Chief Complaint Weakness    HPI Hollan Philipp is a 76 y.o. female with below list of chronic medical conditions including recent hospital admission secondary to congestive heart failure she was discharged home today. Per the patient's daughter patient acutely became unresponsive in front of her grandson CPR was started by the patient's grandson. On fire department arrival patient had a pulse but remained unresponsive. On EMS arrival the patient became responsive while being transferred to the stretcher. Patient does admit to generalized weakness however no other complaints at this time.   Past Medical History:  Diagnosis Date  . Anxiety   . Arthritis   . Chronic combined systolic and diastolic CHF (congestive heart failure) ([place])    a. 01/2008 Echo ([facility]): EF > 55%, mod-sev LVH w/ grade 1 DD, biatrial enlargement, mild AS, trace MR/TR; b. EF 45-50%, GR2DD, mild to moderate aortic stenosis, mild aortic insufficieny, mild to moderate mitral regurgitation, mild tricuspid regurgitation, mild biatrial enlargement, and a small pericardial effusion  . CKD (chronic kidney disease), stage IV ([place])   . Closed patellar sleeve fracture of right knee    a. 01/2017 - conservatively managed.  Marland Kitchen COPD (chronic obstructive pulmonary disease) ([place])   . Coronary artery disease    a. s/p remote stenting of unknown vessel @ [facility].  . Depression   . Diabetes mellitus with complication ([place])   . DVT (deep venous thrombosis) ([facility])    a. in the setting of right patellar fracture on Coumadin  . Hyperlipidemia   . Hypertension   . Parkinson disease [facility])     Patient Active Problem List   Diagnosis Date Noted  . Acute combined systolic (congestive) and diastolic (congestive) heart failure  ([place]) 04/03/2017  . Coronary artery disease of native artery of native heart with stable angina pectoris ([place]) 03/31/2017  . Paroxysmal atrial fibrillation ([place]) 03/31/2017  . Chronic diastolic heart failure ([place]) 02/26/2017  . HTN (hypertension) 02/26/2017  . Parkinson disease ([place]) 02/26/2017  . Intractable pain 12/27/2016    Past Surgical History:  Procedure Laterality Date  . BACK SURGERY    . CORONARY ANGIOPLASTY WITH STENT PLACEMENT    . NECK SURGERY      Prior to Admission medications   Medication Sig Start Date End Date Taking? Authorizing Provider  acetaminophen (TYLENOL) 325 MG tablet Take 650 mg by mouth every 6 (six) hours as needed for mild pain.    [provider]  atorvastatin (LIPITOR) 20 MG tablet Take 20 mg by mouth daily.    [provider]  blood glucose meter kit and supplies KIT Dispense based on patient and insurance preference. Use up to four times daily as directed. (FOR ICD-9 250.00, 250.01). 11/06/16   Rudene Re, MD  Carbidopa-Levodopa ER (SINEMET CR) 25-100 MG tablet controlled release Take 2 tablets by mouth 4 (four) times daily. 03/05/17   Darylene Price A, FNP  Coenzyme Q10 (COQ10) 50 MG CAPS Take 50 mg by mouth daily.    [provider]  CRANBERRY PO Take 1 tablet by mouth daily.    [provider]  escitalopram (LEXAPRO) 20 MG tablet Take 20 mg by mouth daily.    [provider]  furosemide (LASIX) 20 MG tablet Take 1 tablet (20 mg total) by  mouth daily. 03/05/17   Alisa Graff, FNP  gabapentin (NEURONTIN) 300 MG capsule Take 300 mg by mouth daily.     [provider]  glucose blood (ACCU-CHEK AVIVA PLUS) test strip Use as instructed 02/25/17   Darylene Price A, FNP  hydrALAZINE (APRESOLINE) 25 MG tablet Take 1 tablet (25 mg total) by mouth 2 (two) times daily. 03/31/17 06/29/17  End, Harrell Gave, MD  insulin glargine (LANTUS) 100 UNIT/ML injection Inject 40 Units into the skin at bedtime.      [provider]  insulin lispro (HUMALOG) 100 UNIT/ML injection Inject 6 Units into the skin 3 (three) times daily with meals.     [provider]  isosorbide mononitrate (IMDUR) 60 MG 24 hr tablet Take 1 tablet (60 mg total) by mouth daily. 03/05/17   Alisa Graff, FNP  loratadine (CLARITIN) 10 MG tablet Take 5 mg by mouth daily as needed for allergies.     [provider]  LORazepam (ATIVAN) 1 MG tablet Take 1 tablet (1 mg total) by mouth 2 (two) times daily as needed for anxiety. 12/29/16   Max Sane, MD  metoprolol tartrate (LOPRESSOR) 50 MG tablet Take 1 tablet (50 mg total) by mouth 2 (two) times daily. 03/31/17 06/29/17  End, Harrell Gave, MD  mirabegron ER (MYRBETRIQ) 25 MG TB24 tablet Take 25 mg by mouth daily.    [provider]  nitroGLYCERIN (NITROSTAT) 0.4 MG SL tablet Place 0.4 mg under the tongue every 5 (five) minutes as needed for chest pain.    [provider]  Omega-3 Fatty Acids (FISH OIL) 1000 MG CAPS Take 1,000 mg by mouth daily.    [provider]  polyethylene glycol (MIRALAX / GLYCOLAX) packet Take 17 g by mouth daily as needed for mild constipation.    [provider]  promethazine (PHENERGAN) 25 MG tablet Take 25 mg by mouth every 6 (six) hours as needed for nausea or vomiting.    [provider]  saccharomyces boulardii (FLORASTOR) 250 MG capsule Take 250 mg by mouth daily.    [provider]  traMADol (ULTRAM) 50 MG tablet Take 1 tablet (50 mg total) by mouth daily as needed. 12/29/16   Max Sane, MD  warfarin (COUMADIN) 2 MG tablet Take 1 tablet (2 mg total) by mouth as directed. Every Sunday 04/09/17   Fritzi Mandes, MD  warfarin (COUMADIN) 3 MG tablet Take 1 tablet (3 mg total) by mouth daily at 6 PM. Mon,tue,wed,thur,friday, sat 04/09/17   Fritzi Mandes, MD    Allergies No known drug allergies  Family History  Problem Relation Age of Onset  . Cervical cancer Mother   . Kidney failure  Father   . CAD Father   . CAD Brother   . Prostate cancer Neg Hx   . Kidney cancer Neg Hx   . Bladder Cancer Neg Hx     Social History Social History  Substance Use Topics  . Smoking status: Never Smoker  . Smokeless tobacco: Never Used  . Alcohol use No    Review of Systems Constitutional: No fever/chills Eyes: No visual changes. ENT: No sore throat. Cardiovascular: Denies chest pain. Respiratory: Denies shortness of breath. Gastrointestinal: No abdominal pain.  No nausea, no vomiting.  No diarrhea.  No constipation. Genitourinary: Negative for dysuria. Musculoskeletal: Negative for neck pain.  Negative for back pain. Integumentary: Negative for rash. Neurological: Negative for headaches, focal weakness or numbness.  ____________________________________________   PHYSICAL EXAM:  VITAL SIGNS: ED Triage Vitals  Enc Vitals Group     BP 04/09/17 2218 (!) 164/129     Pulse Rate 04/09/17 2218 93     Resp 04/09/17 2218 16     Temp 04/09/17 2218 98 F (36.7 C)     Temp Source 04/09/17 2218 Oral     SpO2 04/09/17 2218 100 %     Weight 04/09/17 2212 68 kg (150 lb)     Height 04/09/17 2212 1.702 m (5' 7")     Head Circumference --      Peak Flow --      Pain Score --      Pain Loc --      Pain Edu? --      Excl. in [place]? --    Constitutional: Alert and oriented To name. Well appearing and in no acute distress. Eyes: Conjunctivae are normal.  Head: Atraumatic. Mouth/Throat: Mucous membranes are moist.  Oropharynx non-erythematous. Neck: No stridor.   Cardiovascular: Normal rate, regular rhythm. Good peripheral circulation. Grossly normal heart sounds. Respiratory: Normal respiratory effort.  No retractions. Lungs CTAB. Gastrointestinal: Soft and nontender. No distention.  Musculoskeletal: No lower extremity tenderness nor edema. No gross deformities of extremities. Neurologic:  Normal speech and language. No gross focal neurologic deficits are appreciated.  Skin:   Skin is warm, dry and intact. No rash noted. Psychiatric: Mood and affect are normal. Speech and behavior are normal.  ____________________________________________   LABS (all labs ordered are listed, but only abnormal results are displayed)  Labs Reviewed  ACETAMINOPHEN LEVEL - Abnormal; Notable for the following:       Result Value   Acetaminophen (Tylenol), Serum <10 (*)    All other components within normal limits  CBC WITH DIFFERENTIAL/PLATELET - Abnormal; Notable for the following:    RBC 3.45 (*)    Hemoglobin 10.0 (*)    HCT 29.8 (*)    RDW 14.8 (*)    All other components within normal limits  COMPREHENSIVE METABOLIC PANEL - Abnormal; Notable for the following:    Potassium 5.6 (*)    Glucose, Bld 165 (*)    BUN 47 (*)    Creatinine, Ser 2.53 (*)    Albumin 3.4 (*)    ALT <5 (*)    GFR calc non Af Amer 17 (*)    GFR calc Af Amer 20 (*)    All other components within normal limits  ETHANOL  SALICYLATE LEVEL  TROPONIN I  CBC WITH DIFFERENTIAL/PLATELET  URINALYSIS, COMPLETE (UACMP) WITH MICROSCOPIC   ____________________________________________  EKG  ED ECG REPORT I, [place] N BROWN, the attending physician, personally viewed and interpreted this ECG.   Date: 04/10/2017  EKG Time: 10:18 PM  Rate: 96  Rhythm: Atrial fibrillation  Axis: Normal  Intervals: Normal  ST&T Change: None  ____________________________________________  RADIOLOGY I, [place] N BROWN, personally viewed and evaluated these images (plain radiographs) as part of my medical decision making, as well as reviewing the written report by the radiologist.  Dg Chest Portable 1 View  Result Date: 04/09/2017 CLINICAL DATA:  Weakness unresponsive EXAM: PORTABLE CHEST 1 VIEW COMPARISON:  04/03/2017 FINDINGS: Moderate cardiomegaly with central vascular congestion, increased compared to prior. Left basilar atelectasis or infiltrate. No pneumothorax. Old right-sided rib fractures. IMPRESSION:  Cardiomegaly with increased central vascular congestion compared to prior. Increased opacity at the left lung base may reflect atelectasis or an infiltrate Electronically Signed   By: Donavan Foil M.D.   On: 04/09/2017 22:51     Procedures  ____________________________________________   INITIAL IMPRESSION / ASSESSMENT AND PLAN / ED COURSE  Pertinent labs & imaging results that were available during my care of the patient were reviewed by me and considered in my medical decision making (see chart for details).  Given history physical exam concern for syncope secondary to cardiac etiology as such patient discussed with Dr. Jannifer Franklin for hospital admission for further evaluation and management.      ____________________________________________  FINAL CLINICAL IMPRESSION(S) / ED DIAGNOSES  Syncope Congestive heart failure  MEDICATIONS GIVEN DURING THIS VISIT:  Medications  sodium chloride 0.9 % bolus 1,000 mL (1,000 mLs Intravenous New Bag/Given 04/09/17 2241)     NEW OUTPATIENT MEDICATIONS STARTED DURING THIS VISIT:  New Prescriptions   No medications on file    Modified Medications   No medications on file    Discontinued Medications   No medications on file     Note:  This document was prepared using Dragon voice recognition software and may include unintentional dictation errors.    Gregor Hams, MD 04/10/17 0800

## 2017-04-09 NOTE — Progress Notes (Signed)
The RN called the patient's grandson Pennie Rushing(Seth)  to see if they have any plans to pick the patient up. Grandson indicated his mom has already talked to the outgoing nurse and he doesn't see why this should be another discussion. He indicated his mom will come and pick her up tomorrow at 0900. The RN indicated to the grandson that the patient may be charged  for staying tonight. The RN indicated to Pennie RushingSeth that MD will be contacted to see the plans for the pt and will call him back.

## 2017-04-09 NOTE — Anesthesia Postprocedure Evaluation (Signed)
Anesthesia Post Note  Patient: Molly Wolf  Procedure(s) Performed: Procedure(s) (LRB): ESOPHAGOGASTRODUODENOSCOPY (EGD) (N/A)  Patient location during evaluation: Endoscopy Anesthesia Type: General Level of consciousness: awake and alert Pain management: pain level controlled Vital Signs Assessment: post-procedure vital signs reviewed and stable Respiratory status: spontaneous breathing, nonlabored ventilation, respiratory function stable and patient connected to nasal cannula oxygen Cardiovascular status: blood pressure returned to baseline and stable Postop Assessment: no signs of nausea or vomiting Anesthetic complications: no     Last Vitals:  Vitals:   04/09/17 1122 04/09/17 1323  BP: (!) 159/106 (!) 146/110  Pulse: 100 83  Resp: 15   Temp:      Last Pain:  Vitals:   04/09/17 0752  TempSrc: Oral  PainSc:                  Victoriah Wilds S

## 2017-04-09 NOTE — Discharge Instructions (Addendum)
Heart Failure Clinic appointment on April 13, 2017 at 1:40pm with Molly Kindredina Hackney, FNP. Please call [phone] to reschedule.    Information on my medicine - Coumadin   (Warfarin)  This medication education was reviewed with me or my healthcare representative as part of my discharge preparation.  The pharmacist that spoke with me during my hospital stay was:  Olene FlossMelissa D Garnet [place], [facility]   Change is warfarin dose- take 3mg  by mouth once daily everyday except on Sunday take 2mg .  Why was Coumadin prescribed for you? Coumadin was prescribed for you because you have a blood clot or a medical condition that can cause an increased risk of forming blood clots. Blood clots can cause serious health problems by blocking the flow of blood to the heart, lung, or brain. Coumadin can prevent harmful blood clots from forming. As a reminder your indication for Coumadin is:   Stroke Prevention Because Of Atrial Fibrillation  What test will check on my response to Coumadin? While on Coumadin (warfarin) you will need to have an INR test regularly to ensure that your dose is keeping you in the desired range. The INR (international normalized ratio) number is calculated from the result of the laboratory test called prothrombin time (PT).  If an INR APPOINTMENT HAS NOT ALREADY BEEN MADE FOR YOU please schedule an appointment to have this lab work done by your health care provider within 7 days. Your INR goal is usually a number between:  2 to 3 or your provider may give you a more narrow range like 2-2.5.  Ask your health care provider during an office visit what your goal INR is.  What  do you need to  know  About  COUMADIN? Take Coumadin (warfarin) exactly as prescribed by your healthcare provider about the same time each day.  DO NOT stop taking without talking to the doctor who prescribed the medication.  Stopping without other blood clot prevention medication to take the place of Coumadin may increase your risk of  developing a new clot or stroke.  Get refills before you run out.  What do you do if you miss a dose? If you miss a dose, take it as soon as you remember on the same day then continue your regularly scheduled regimen the next day.  Do not take two doses of Coumadin at the same time.  Important Safety Information A possible side effect of Coumadin (Warfarin) is an increased risk of bleeding. You should call your healthcare provider right away if you experience any of the following: ? Bleeding from an injury or your nose that does not stop. ? Unusual colored urine (red or dark brown) or unusual colored stools (red or black). ? Unusual bruising for unknown reasons. ? A serious fall or if you hit your head (even if there is no bleeding).  Some foods or medicines interact with Coumadin (warfarin) and might alter your response to warfarin. To help avoid this: ? Eat a balanced diet, maintaining a consistent amount of Vitamin K. ? Notify your provider about major diet changes you plan to make. ? Avoid alcohol or limit your intake to 1 drink for women and 2 drinks for men per day. (1 drink is 5 oz. wine, 12 oz. beer, or 1.5 oz. liquor.)  Make sure that ANY health care provider who prescribes medication for you knows that you are taking Coumadin (warfarin).  Also make sure the healthcare provider who is monitoring your Coumadin knows when you have started a  new medication including herbals and non-prescription products.  Coumadin (Warfarin)  Major Drug Interactions  Increased Warfarin Effect Decreased Warfarin Effect  Alcohol (large quantities) Antibiotics (esp. Septra/Bactrim, Flagyl, Cipro) Amiodarone (Cordarone) Aspirin (ASA) Cimetidine (Tagamet) Megestrol (Megace) NSAIDs (ibuprofen, naproxen, etc.) Piroxicam (Feldene) Propafenone (Rythmol SR) Propranolol (Inderal) Isoniazid (INH) Posaconazole (Noxafil) Barbiturates (Phenobarbital) Carbamazepine (Tegretol) Chlordiazepoxide  (Librium) Cholestyramine (Questran) Griseofulvin Oral Contraceptives Rifampin Sucralfate (Carafate) Vitamin K   Coumadin (Warfarin) Major Herbal Interactions  Increased Warfarin Effect Decreased Warfarin Effect  Garlic Ginseng Ginkgo biloba Coenzyme Q10 Green tea St. Johns wort    Coumadin (Warfarin) FOOD Interactions  Eat a consistent number of servings per week of foods HIGH in Vitamin K (1 serving =  cup)  Collards (cooked, or boiled & drained) Kale (cooked, or boiled & drained) Mustard greens (cooked, or boiled & drained) Parsley *serving size only =  cup Spinach (cooked, or boiled & drained) Swiss chard (cooked, or boiled & drained) Turnip greens (cooked, or boiled & drained)  Eat a consistent number of servings per week of foods MEDIUM-HIGH in Vitamin K (1 serving = 1 cup)  Asparagus (cooked, or boiled & drained) Broccoli (cooked, boiled & drained, or raw & chopped) Brussel sprouts (cooked, or boiled & drained) *serving size only =  cup Lettuce, raw (green leaf, endive, romaine) Spinach, raw Turnip greens, raw & chopped   These websites have more information on Coumadin (warfarin):  http://www.king-russell.com/; https://www.hines.net/;

## 2017-04-09 NOTE — Care Management (Signed)
Will not qualify for home O2 per Molly Wolf with [facility] since O2 sats dropped during a "coughing spell".

## 2017-04-09 NOTE — Plan of Care (Signed)
Problem: Health Behavior/Discharge Planning: Goal: Ability to manage health-related needs will improve Outcome: Completed/Met Date Met: 04/09/17 Ready for discharge

## 2017-04-09 NOTE — Discharge Summary (Signed)
[facility] NAME: Molly Wolf    MR#:  [id number]  DATE OF BIRTH:  1941-09-15  DATE OF ADMISSION:  04/03/2017 ADMITTING PHYSICIAN: Harvie Bridge, DO  DATE OF DISCHARGE: 04/09/2017  PRIMARY CARE PHYSICIAN: Arlyce Harman, Luther Redo, MD    ADMISSION DIAGNOSIS:  Acute renal failure superimposed on chronic kidney disease, unspecified CKD stage, unspecified acute renal failure type ([place]) [N17.9, N18.9] Acute on chronic congestive heart failure, unspecified heart failure type ([place]) [I50.9]  DISCHARGE DIAGNOSIS:  Acute on chronic congestive heart failure systolic/diastolic stable Melena --- GI workup negative with normal endoscopy Generalized weakness History of DVT resumed Coumadin SECONDARY DIAGNOSIS:   Past Medical History:  Diagnosis Date  . Anxiety   . Arthritis   . Chronic combined systolic and diastolic CHF (congestive heart failure) ([place])    a. 01/2008 Echo ([facility]): EF > 55%, mod-sev LVH w/ grade 1 DD, biatrial enlargement, mild AS, trace MR/TR; b. EF 45-50%, GR2DD, mild to moderate aortic stenosis, mild aortic insufficieny, mild to moderate mitral regurgitation, mild tricuspid regurgitation, mild biatrial enlargement, and a small pericardial effusion  . CKD (chronic kidney disease), stage IV ([place])   . Closed patellar sleeve fracture of right knee    a. 01/2017 - conservatively managed.  Marland Kitchen COPD (chronic obstructive pulmonary disease) ([place])   . Coronary artery disease    a. s/p remote stenting of unknown vessel @ [facility].  . Depression   . Diabetes mellitus with complication ([place])   . DVT (deep venous thrombosis) ([facility])    a. in the setting of right patellar fracture on Coumadin  . Hyperlipidemia   . Hypertension   . Parkinson disease [facility])     HOSPITAL COURSE:   76 y.o.femalewith a history of chronic combined systolic and diastolic heart failure, CKD4, pulmonary hypertension, CAD status post PCI, Parkinson's, diabetes,  COPD, hypertension, hyperlipidemia, DVT on Coumadin, atrial fibrillation diagnosed 06/20/2018now being admitted with:  #. Acute exacerbation of chronic combined systolic and diastolicheart failure - Continue Telemetry monitoring- afib - Beta blocker, diuretic, nitrate.  - Trend troponins- negative, checked lipids and TSH- normal. - recent Echo April 2018, repeated echo shows worsening EF. - Cardiology consultation appreciated. - held lasix now, and monitor... Remains stable -assess for home oxygen  #. Atrial fibrillation with RVR - Rate control with metoprolol, increase the doses still was tachycardic. - INR therapeutic at 2.99---1.65--1.48 - Coumadin now resumed  #. Acute kidney injury on CKD4 - Avoid nephrotoxic medications -Stable.  #. Anemia, ? UGIB (positive melena, guaiac negative stool -Received IV Protonix gtt - Serial CBC and transfuse as needed- hemoglobin stable. - Resumed anticoagulant now that EGD is negative for any evidence of GI bleed  #. History of hypertension - Continue hydralazine, metoprolol, Lasix  #. History of Parkinson's - Continue Sinemet  #. History of hyperlipidemia - Continue atorvastatin  #. History of depression - Continue Lexapro  #. History of neuropathy - Continue gabapentin  #. History of diabetes  -resume lantus and ssi  # d/c planning to home with [facility] to check INR July 2 and fax results to primary care physician to adjust dosing Left message for patient's daughter [name]. Patient will discharge to home.  CONSULTS OBTAINED:    DRUG ALLERGIES:  No Known Allergies  DISCHARGE MEDICATIONS:   Current Discharge Medication List    CONTINUE these medications which have CHANGED   Details  !! warfarin (COUMADIN) 2 MG tablet Take 1 tablet (  2 mg total) by mouth as directed. Every Sunday Qty: 30 tablet, Refills: 0    !! warfarin (COUMADIN) 3 MG tablet Take 1 tablet (3 mg total) by mouth daily at 6 PM.  Mon,tue,wed,thur,friday, sat Qty: 30 tablet, Refills: 1     !! - Potential duplicate medications found. Please discuss with provider.    CONTINUE these medications which have NOT CHANGED   Details  acetaminophen (TYLENOL) 325 MG tablet Take 650 mg by mouth every 6 (six) hours as needed for mild pain.    atorvastatin (LIPITOR) 20 MG tablet Take 20 mg by mouth daily.    blood glucose meter kit and supplies KIT Dispense based on patient and insurance preference. Use up to four times daily as directed. (FOR ICD-9 250.00, 250.01). Qty: 1 each, Refills: 0    Carbidopa-Levodopa ER (SINEMET CR) 25-100 MG tablet controlled release Take 2 tablets by mouth 4 (four) times daily. Qty: 240 tablet, Refills: 1    Coenzyme Q10 (COQ10) 50 MG CAPS Take 50 mg by mouth daily.    CRANBERRY PO Take 1 tablet by mouth daily.    escitalopram (LEXAPRO) 20 MG tablet Take 20 mg by mouth daily.    furosemide (LASIX) 20 MG tablet Take 1 tablet (20 mg total) by mouth daily. Qty: 30 tablet, Refills: 2    gabapentin (NEURONTIN) 300 MG capsule Take 300 mg by mouth daily.     glucose blood (ACCU-CHEK AVIVA PLUS) test strip Use as instructed Qty: 100 each, Refills: 12    hydrALAZINE (APRESOLINE) 25 MG tablet Take 1 tablet (25 mg total) by mouth 2 (two) times daily. Qty: 180 tablet, Refills: 3    insulin glargine (LANTUS) 100 UNIT/ML injection Inject 40 Units into the skin at bedtime.     insulin lispro (HUMALOG) 100 UNIT/ML injection Inject 6 Units into the skin 3 (three) times daily with meals.     isosorbide mononitrate (IMDUR) 60 MG 24 hr tablet Take 1 tablet (60 mg total) by mouth daily. Qty: 30 tablet, Refills: 2    loratadine (CLARITIN) 10 MG tablet Take 5 mg by mouth daily as needed for allergies.     LORazepam (ATIVAN) 1 MG tablet Take 1 tablet (1 mg total) by mouth 2 (two) times daily as needed for anxiety. Qty: 5 tablet, Refills: 0    metoprolol tartrate (LOPRESSOR) 50 MG tablet Take 1 tablet (50  mg total) by mouth 2 (two) times daily. Qty: 180 tablet, Refills: 3    mirabegron ER (MYRBETRIQ) 25 MG TB24 tablet Take 25 mg by mouth daily.    nitroGLYCERIN (NITROSTAT) 0.4 MG SL tablet Place 0.4 mg under the tongue every 5 (five) minutes as needed for chest pain.    Omega-3 Fatty Acids (FISH OIL) 1000 MG CAPS Take 1,000 mg by mouth daily.    polyethylene glycol (MIRALAX / GLYCOLAX) packet Take 17 g by mouth daily as needed for mild constipation.    promethazine (PHENERGAN) 25 MG tablet Take 25 mg by mouth every 6 (six) hours as needed for nausea or vomiting.    saccharomyces boulardii (FLORASTOR) 250 MG capsule Take 250 mg by mouth daily.    traMADol (ULTRAM) 50 MG tablet Take 1 tablet (50 mg total) by mouth daily as needed. Qty: 5 tablet, Refills: 0        If you experience worsening of your admission symptoms, develop shortness of breath, life threatening emergency, suicidal or homicidal thoughts you must seek medical attention immediately by calling 911 or calling  your MD immediately  if symptoms less severe.  You Must read complete instructions/literature along with all the possible adverse reactions/side effects for all the Medicines you take and that have been prescribed to you. Take any new Medicines after you have completely understood and accept all the possible adverse reactions/side effects.   Please note  You were cared for by a hospitalist during your hospital stay. If you have any questions about your discharge medications or the care you received while you were in the hospital after you are discharged, you can call the unit and asked to speak with the hospitalist on call if the hospitalist that took care of you is not available. Once you are discharged, your primary care physician will handle any further medical issues. Please note that NO REFILLS for any discharge medications will be authorized once you are discharged, as it is imperative that you return to your primary  care physician (or establish a relationship with a primary care physician if you do not have one) for your aftercare needs so that they can reassess your need for medications and monitor your lab values. Today   SUBJECTIVE   No new complaints  VITAL SIGNS:  Blood pressure (!) 146/110, pulse 83, temperature 97.5 F (36.4 C), temperature source Oral, resp. rate 15, height 5' 7.5" (1.715 m), weight 68.9 kg (152 lb), SpO2 92 %.  I/O:   Intake/Output Summary (Last 24 hours) at 04/09/17 1346 Last data filed at 04/09/17 3559  Gross per 24 hour  Intake          1071.67 ml  Output              500 ml  Net           571.67 ml    PHYSICAL EXAMINATION:  GENERAL:  76 y.o.-year-old patient lying in the bed with no acute distress.  EYES: Pupils equal, round, reactive to light and accommodation. No scleral icterus. Extraocular muscles intact.  HEENT: Head atraumatic, normocephalic. Oropharynx and nasopharynx clear.  NECK:  Supple, no jugular venous distention. No thyroid enlargement, no tenderness.  LUNGS: Normal breath sounds bilaterally, no wheezing, rales,rhonchi or crepitation. No use of accessory muscles of respiration.  CARDIOVASCULAR: S1, S2 normal. No murmurs, rubs, or gallops.  ABDOMEN: Soft, non-tender, non-distended. Bowel sounds present. No organomegaly or mass.  EXTREMITIES: No pedal edema, cyanosis, or clubbing.  NEUROLOGIC: Cranial nerves II through XII are intact. Muscle strength 5/5 in all extremities. Sensation intact. Gait not checked. Overall weak PSYCHIATRIC: The patient is alert and oriented x 3.  SKIN: No obvious rash, lesion, or ulcer.   DATA REVIEW:   CBC   Recent Labs Lab 04/07/17 0436  WBC 4.9  HGB 9.3*  HCT 27.8*  PLT 163    Chemistries   Recent Labs Lab 04/04/17 0022 04/04/17 0531  04/08/17 0347  NA  --  140  < > 137  K  --  4.4  < > 5.1  CL  --  110  < > 108  CO2  --  21*  < > 25  GLUCOSE  --  72  < > 186*  BUN  --  51*  < > 51*  CREATININE   --  2.48*  < > 2.60*  CALCIUM  --  8.4*  < > 8.5*  MG 2.1  --   --   --   AST  --  23  --   --   ALT  --  <5*  --   --  ALKPHOS  --  91  --   --   BILITOT  --  0.7  --   --   < > = values in this interval not displayed.  Microbiology Results   No results found for this or any previous visit (from the past 240 hour(s)).  RADIOLOGY:  No results found.   Management plans discussed with the patient, family and they are in agreement.  CODE STATUS:     Code Status Orders        Start     Ordered   04/03/17 2331  Full code  Continuous     04/03/17 2330    Code Status History    Date Active Date Inactive Code Status Order ID Comments User Context   02/15/2017  6:16 PM 02/16/2017  6:18 PM Full Code [id number]  Vaughan Basta, MD Inpatient   01/26/2017  2:44 PM 01/29/2017  9:47 PM Full Code [id number]  Loletha Grayer, MD ED   12/28/2016  7:44 AM 12/29/2016  4:34 PM DNR [id number]  Max Sane, MD Inpatient   12/27/2016 11:06 PM 12/28/2016  7:44 AM Full Code [id number]  Hugelmeyer, Ubaldo Glassing, DO Inpatient    Advance Directive Documentation     Most Recent Value  Type of Advance Directive  Healthcare Power of [place], Living will  Pre-existing out of facility DNR order (yellow form or pink MOST form)  -  "MOST" Form in Place?  -      TOTAL TIME TAKING CARE OF THIS PATIENT: 40 minutes.    Gohan Collister M.D on 04/09/2017 at 1:46 PM  Between 7am to 6pm - Pager - [phone] After 6pm go to [email or web address] - password [email or web address] [facility]  Office  [phone]  CC: Primary care physician; Suann Larry, MD

## 2017-04-09 NOTE — Progress Notes (Addendum)
Patient was expecting her daughter to pick her up before 16:00.  At 1600 she was unable to reach her daughter.  I just spoke to her daughter Lupita Leash(Donna), just now.  She is at work as is the patient's son.  No one can pick her up until tomorrow at 0900.  Telemetry has been dc'd.  The IV has been removed.  Discharge instructions were completed.  The AVS was reviewed.

## 2017-04-09 NOTE — Care Management Note (Addendum)
Case Management Note  Patient Details  Name: Molly Wolf MRN: [id number] Date of Birth: 1941/06/30  Subjective/Objective:  Discharging today                  Action/Plan: [facility] notified of discharge. Home Health orders are in. She does not qualify for home O2. LM with daughter, Molly Wolf regarding discharge plan.   Expected Discharge Date:  04/09/17               Expected Discharge Plan:  Home w Home Health Services  In-House Referral:     Discharge planning Services  CM Consult  Post Acute Care Choice:  Resumption of Svcs/PTA Provider Choice offered to:  Adult Children  DME Arranged:    DME Agency:     HH Arranged:  RN, PT HH Agency:  [facility]  Status of Service:  Completed, signed off  If discussed at Long Length of Stay Meetings, dates discussed:    Additional Comments:  Marily MemosLisa M Alisen Marsiglia, RN 04/09/2017, 2:11 PM

## 2017-04-09 NOTE — Progress Notes (Signed)
Patient just left the hospital accompanied by her grandson and 2 NTs to her vihicle. No acute distress noted or any c/o pain. D/C papers signed and filed in the her chart.

## 2017-04-09 NOTE — H&P (Signed)
Jonathon Bellows MD [address] [place], [place] [address] Phone: [phone] Fax : [phone]  Primary Care Physician:  Suann Larry, MD Primary Gastroenterologist:  Dr. Jonathon Bellows   Pre-Procedure History & Physical: HPI:  Molly Wolf is a 76 y.o. female is here for an endoscopy.   Past Medical History:  Diagnosis Date  . Anxiety   . Arthritis   . Chronic combined systolic and diastolic CHF (congestive heart failure) ([place])    a. 01/2008 Echo ([facility]): EF > 55%, mod-sev LVH w/ grade 1 DD, biatrial enlargement, mild AS, trace MR/TR; b. EF 45-50%, GR2DD, mild to moderate aortic stenosis, mild aortic insufficieny, mild to moderate mitral regurgitation, mild tricuspid regurgitation, mild biatrial enlargement, and a small pericardial effusion  . CKD (chronic kidney disease), stage IV ([place])   . Closed patellar sleeve fracture of right knee    a. 01/2017 - conservatively managed.  Marland Kitchen COPD (chronic obstructive pulmonary disease) ([place])   . Coronary artery disease    a. s/p remote stenting of unknown vessel @ [facility].  . Depression   . Diabetes mellitus with complication ([place])   . DVT (deep venous thrombosis) ([facility])    a. in the setting of right patellar fracture on Coumadin  . Hyperlipidemia   . Hypertension   . Parkinson disease [facility])     Past Surgical History:  Procedure Laterality Date  . BACK SURGERY    . CORONARY ANGIOPLASTY WITH STENT PLACEMENT    . NECK SURGERY      Prior to Admission medications   Medication Sig Start Date End Date Taking? Authorizing Provider  acetaminophen (TYLENOL) 325 MG tablet Take 650 mg by mouth every 6 (six) hours as needed for mild pain.   Yes [provider]  atorvastatin (LIPITOR) 20 MG tablet Take 20 mg by mouth daily.   Yes [provider]  blood glucose meter kit and supplies KIT Dispense based on patient and insurance preference. Use up to four times daily as directed. (FOR ICD-9 250.00, 250.01). 11/06/16  Yes  Alfred Levins, [place], MD  Carbidopa-Levodopa ER (SINEMET CR) 25-100 MG tablet controlled release Take 2 tablets by mouth 4 (four) times daily. 03/05/17  Yes Hackney, Tina A, FNP  Coenzyme Q10 (COQ10) 50 MG CAPS Take 50 mg by mouth daily.   Yes [provider]  CRANBERRY PO Take 1 tablet by mouth daily.   Yes [provider]  escitalopram (LEXAPRO) 20 MG tablet Take 20 mg by mouth daily.   Yes [provider]  furosemide (LASIX) 20 MG tablet Take 1 tablet (20 mg total) by mouth daily. 03/05/17  Yes Hackney, Otila Kluver A, FNP  gabapentin (NEURONTIN) 300 MG capsule Take 300 mg by mouth daily.    Yes [provider]  glucose blood (ACCU-CHEK AVIVA PLUS) test strip Use as instructed 02/25/17  Yes Darylene Price A, FNP  hydrALAZINE (APRESOLINE) 25 MG tablet Take 1 tablet (25 mg total) by mouth 2 (two) times daily. 03/31/17 06/29/17 Yes End, Harrell Gave, MD  insulin glargine (LANTUS) 100 UNIT/ML injection Inject 40 Units into the skin at bedtime.    Yes [provider]  insulin lispro (HUMALOG) 100 UNIT/ML injection Inject 6 Units into the skin 3 (three) times daily with meals.    Yes [provider]  isosorbide mononitrate (IMDUR) 60 MG 24 hr tablet Take 1 tablet (60 mg total) by mouth daily. 03/05/17  Yes Hackney, Otila Kluver A, FNP  loratadine (CLARITIN) 10 MG tablet Take 5 mg by mouth  daily as needed for allergies.    Yes [provider]  LORazepam (ATIVAN) 1 MG tablet Take 1 tablet (1 mg total) by mouth 2 (two) times daily as needed for anxiety. 12/29/16  Yes Max Sane, MD  metoprolol tartrate (LOPRESSOR) 50 MG tablet Take 1 tablet (50 mg total) by mouth 2 (two) times daily. 03/31/17 06/29/17 Yes End, Harrell Gave, MD  mirabegron ER (MYRBETRIQ) 25 MG TB24 tablet Take 25 mg by mouth daily.   Yes [provider]  nitroGLYCERIN (NITROSTAT) 0.4 MG SL tablet Place 0.4 mg under the tongue every 5 (five) minutes as needed for chest pain.   Yes [provider]  Omega-3 Fatty Acids (FISH OIL) 1000 MG CAPS Take 1,000 mg by mouth daily.   Yes [provider]  polyethylene glycol (MIRALAX / GLYCOLAX) packet Take 17 g by mouth daily as needed for mild constipation.   Yes [provider]  promethazine (PHENERGAN) 25 MG tablet Take 25 mg by mouth every 6 (six) hours as needed for nausea or vomiting.   Yes [provider]  saccharomyces boulardii (FLORASTOR) 250 MG capsule Take 250 mg by mouth daily.   Yes [provider]  traMADol (ULTRAM) 50 MG tablet Take 1 tablet (50 mg total) by mouth daily as needed. 12/29/16  Yes Max Sane, MD  warfarin (COUMADIN) 3 MG tablet Take 3 mg by mouth daily.   Yes [provider]    Allergies as of 04/03/2017  . (No Known Allergies)    Family History  Problem Relation Age of Onset  . Cervical cancer Mother   . Kidney failure Father   . CAD Father   . CAD Brother   . Prostate cancer Neg Hx   . Kidney cancer Neg Hx   . Bladder Cancer Neg Hx     Social History   Social History  . Marital status: Single    Spouse name: N/A  . Number of children: N/A  . Years of education: N/A   Occupational History  . Not on file.   Social History Main Topics  . Smoking status: Never Smoker  . Smokeless tobacco: Never Used  . Alcohol use No  . Drug use: No  . Sexual activity: No   Other Topics Concern  . Not on file   Social History Narrative  . No narrative on file    Review of Systems: See HPI, otherwise negative ROS  Physical Exam: BP (!) 140/98   Pulse 100   Temp 97.5 F (36.4 C) (Oral)   Resp 20   Ht 5' 7.5" (1.715 m)   Wt 152 lb (68.9 kg)   SpO2 99%   BMI 23.46 kg/m  General:   Alert,  pleasant and cooperative in NAD Head:  Normocephalic and atraumatic. Neck:  Supple; no masses or thyromegaly. Lungs:  Clear throughout to auscultation.    Heart:  Regular rate and rhythm. Abdomen:  Soft, nontender and nondistended. Normal bowel sounds,  without guarding, and without rebound.   Neurologic:  Alert and  oriented x4;  grossly normal neurologically.  Impression/Plan: Molly Wolf is here for an endoscopy to be performed for GI bleed  Risks, benefits, limitations, and alternatives regarding  endoscopy have been reviewed with the patient.  Questions have been answered.  All parties agreeable.   Jonathon Bellows, MD  04/09/2017, 10:27 AM

## 2017-04-09 NOTE — ED Notes (Signed)
Pt back from CT

## 2017-04-09 NOTE — Care Management Important Message (Signed)
Important Message  Patient Details  Name: Renella CunasJudy [place] MRN: [id number] Date of Birth: 23-Dec-1940   Medicare Important Message Given:  Yes    Marily MemosLisa M Chaquana Nichols, RN 04/09/2017, 2:11 PM

## 2017-04-09 NOTE — Anesthesia Post-op Follow-up Note (Cosign Needed)
Anesthesia QCDR form completed.        

## 2017-04-09 NOTE — Consult Note (Signed)
ANTICOAGULATION CONSULT NOTE - Initial Consult  Pharmacy Consult for warfarin Indication: atrial fibrillation and DVT  No Known Allergies  Patient Measurements: Height: 5' 7.5" (171.5 cm) Weight: 152 lb (68.9 kg) IBW/kg (Calculated) : 62.75 Heparin Dosing Weight:   Vital Signs: Temp: 97.5 F (36.4 C) (06/29 0752) Temp Source: Oral (06/29 0752) BP: 159/106 (06/29 1122) Pulse Rate: 100 (06/29 1122)  Labs:  Recent Labs  04/07/17 0436 04/08/17 0347  HGB 9.3*  --   HCT 27.8*  --   PLT 163  --   LABPROT 19.4* 18.1*  INR 1.62 1.48  CREATININE 2.52* 2.60*    Estimated Creatinine Clearance: 18 mL/min (A) (by C-G formula based on SCr of 2.6 mg/dL (H)).   Medical History: Past Medical History:  Diagnosis Date  . Anxiety   . Arthritis   . Chronic combined systolic and diastolic CHF (congestive heart failure) ([facility])    a. 01/2008 Echo ([facility]): EF > 55%, mod-sev LVH w/ grade 1 DD, biatrial enlargement, mild AS, trace MR/TR; b. EF 45-50%, GR2DD, mild to moderate aortic stenosis, mild aortic insufficieny, mild to moderate mitral regurgitation, mild tricuspid regurgitation, mild biatrial enlargement, and a small pericardial effusion  . CKD (chronic kidney disease), stage IV (H[facility])   . Closed patellar sleeve fracture of right knee    a. 01/2017 - conservatively managed.  Marland Kitchen COPD (chronic obstructive pulmonary disease) ([facility])   . Coronary artery disease    a. s/p remote stenting of unknown vessel @ [facility].  . Depression   . Diabetes mellitus with complication ([facility])   . DVT (deep venous thrombosis) ([facility])    a. in the setting of right patellar fracture on Coumadin  . Hyperlipidemia   . Hypertension   . Parkinson disease ([facility])     Medications:  Scheduled:  . [MAR Hold] atorvastatin  20 mg Oral Daily  . [MAR Hold] Carbidopa-Levodopa ER  2 tablet Oral QID  . [MAR Hold] escitalopram  10 mg Oral Daily  . [MAR Hold] gabapentin  300 mg Oral Daily  . [MAR Hold] insulin aspart  0-5 Units  Subcutaneous QHS  . [MAR Hold] insulin aspart  0-9 Units Subcutaneous TID WC  . [MAR Hold] insulin glargine  20 Units Subcutaneous QHS  . [MAR Hold] isosorbide mononitrate  60 mg Oral Daily  . [MAR Hold] metoprolol tartrate  75 mg Oral BID  . [MAR Hold] mirabegron ER  25 mg Oral Daily  . [MAR Hold] pantoprazole  40 mg Intravenous Q12H  . [MAR Hold] pneumococcal 23 valent vaccine  0.5 mL Intramuscular Tomorrow-1000  . [MAR Hold] saccharomyces boulardii  250 mg Oral Daily  . [START ON 04/11/2017] warfarin  2 mg Oral Once per day on Sun  . warfarin  3 mg Oral Once per day on Mon Tue Wed Thu Fri Sat  . Warfarin - Pharmacist Dosing Inpatient   Does not apply q1800    Assessment: Patient is a 76 year old female with a PMH of DVT and afib. Pt on warfarin PTA. Was held due to possible GI bleed. EGD neg for any bleed. CBC has remained stable. Resume warfarin per Dr. Allena Katz. Patient home dose PTA was 3mg  daily. This was a new dose (pt had been on for 2 weeks). On admission her INR was therapeutic at 2.99.  Goal of Therapy:  INR 2-3 Monitor platelets by anticoagulation protocol: Yes   Plan:  Due to pt decline in renal fx and her INR was at the upper limit of  goal x2 on 3mg  daily, I will decrease the dose slightly. Will resume warfarin at 3mg  daily, except 2mg  on Sunday. INR was 1.48 yesterday after holding for 5 days. Recheck INR in the AM along w/ CBC.  Olene FlossMelissa D Zakiah Gauthreaux, Pharm.D, BCPS Clinical Pharmacist  04/09/2017,11:36 AM

## 2017-04-09 NOTE — Transfer of Care (Signed)
Immediate Anesthesia Transfer of Care Note  Patient: Renella CunasJudy Gelpi  Procedure(s) Performed: Procedure(s): ESOPHAGOGASTRODUODENOSCOPY (EGD) (N/A)  Patient Location: Endoscopy Unit  Anesthesia Type:General  Level of Consciousness: sedated  Airway & Oxygen Therapy: Patient connected to nasal cannula oxygen  Post-op Assessment: Post -op Vital signs reviewed and stable  Post vital signs: stable  Last Vitals:  Vitals:   04/09/17 1050 04/09/17 1052  BP: 138/87 138/87  Pulse: 80 85  Resp: 20 16  Temp:      Last Pain:  Vitals:   04/09/17 0752  TempSrc: Oral  PainSc:          Complications: No apparent anesthesia complications

## 2017-04-09 NOTE — ED Notes (Signed)
family at bedside.

## 2017-04-09 NOTE — Progress Notes (Signed)
The  RN called Dr. Renae GlossWieting  to let him know the family is not coming to pick the patient's up today and if he would like to cancell the D/C plans. Dr. Renae GlossWieting indicated he was not going to cancel the D/c plans and that the RN should let the grandson know that if the patient stays tonight they might have to pay from their pocket. The RN  communicated MD's message to the grandson and he indicated he was going to talk to his mom and will call back.

## 2017-04-09 NOTE — Progress Notes (Signed)
SATURATION QUALIFICATIONS: (This note is used to comply with regulatory documentation for home oxygen)  Patient Saturations on Room Air at Rest = 100  Patient Saturations on Room Air while Ambulating =97  Patient Saturations on  Liters of oxygen while Ambulating   Please briefly explain why patient needs home oxygen:   

## 2017-04-09 NOTE — Progress Notes (Signed)
See endoscopy note for recommendations  I will sign off.  Please call me if any further GI concerns or questions.  We would like to thank you for the opportunity to participate in the care of Molly Wolf.   Dr Wyline MoodKiran Shiara Mcgough MD,MRCP [facility] Gastroenterology/Hepatology Pager: [phone]

## 2017-04-09 NOTE — Anesthesia Preprocedure Evaluation (Addendum)
Anesthesia Evaluation  Patient identified by MRN, date of birth, ID band Patient awake    Reviewed: Allergy & Precautions, NPO status , Patient's Chart, lab work & pertinent test results, reviewed documented beta blocker date and time   Airway Mallampati: II  TM Distance: >3 FB     Dental  (+) Chipped   Pulmonary COPD,           Cardiovascular hypertension, Pt. on medications and Pt. on home beta blockers + angina + CAD, + Cardiac Stents and +CHF       Neuro/Psych PSYCHIATRIC DISORDERS Anxiety Depression    GI/Hepatic   Endo/Other  diabetes, Type 2  Renal/GU Renal InsufficiencyRenal disease     Musculoskeletal  (+) Arthritis ,   Abdominal   Peds  Hematology   Anesthesia Other Findings Neck movement OK> Parkinson's. EKG shows AF and Lbbb. K 5.1.  Reproductive/Obstetrics                            Anesthesia Physical Anesthesia Plan  ASA: III  Anesthesia Plan: General   Post-op Pain Management:    Induction: Intravenous  PONV Risk Score and Plan:   Airway Management Planned:   Additional Equipment:   Intra-op Plan:   Post-operative Plan:   Informed Consent: I have reviewed the patients History and Physical, chart, labs and discussed the procedure including the risks, benefits and alternatives for the proposed anesthesia with the patient or authorized representative who has indicated his/her understanding and acceptance.     Plan Discussed with: CRNA  Anesthesia Plan Comments:         Anesthesia Quick Evaluation

## 2017-04-10 DIAGNOSIS — J9601 Acute respiratory failure with hypoxia: Secondary | ICD-10-CM | POA: Diagnosis present

## 2017-04-10 LAB — TROPONIN I
Troponin I: <0.03 ng/mL (ref <0.03)
Troponin I: <0.03 ng/mL (ref <0.03)

## 2017-04-10 LAB — GLUCOSE, CAPILLARY
Glucose-Capillary: 185 mg/dL — ABNORMAL HIGH (ref 65–99)
Glucose-Capillary: 181 mg/dL — ABNORMAL HIGH (ref 65–99)
Glucose-Capillary: 157 mg/dL — ABNORMAL HIGH (ref 65–99)
Glucose-Capillary: 140 mg/dL — ABNORMAL HIGH (ref 65–99)

## 2017-04-10 LAB — MRSA PCR SCREENING: MRSA BY PCR: NEGATIVE

## 2017-04-10 LAB — TSH: TSH: 2.925 u[IU]/mL (ref 0.350–4.500)

## 2017-04-10 MED ORDER — COQ10 50 MG PO CAPS: 50.0000 mg | ORAL_CAPSULE | Freq: Every day | ORAL | Status: DC

## 2017-04-10 MED ORDER — SODIUM CHLORIDE 0.9 % IV BOLUS (SEPSIS): 500.0000 mL | Freq: Once | INTRAVENOUS | Status: DC

## 2017-04-10 MED ORDER — SODIUM POLYSTYRENE SULFONATE 15 GM/60ML PO SUSP
15.0000 g | Freq: Once | ORAL | Status: AC
  Administered 2017-04-10: 15 g via ORAL
  Filled 2017-04-10: qty 60

## 2017-04-10 MED ORDER — DOCUSATE SODIUM 100 MG PO CAPS
100.0000 mg | ORAL_CAPSULE | Freq: Two times a day (BID) | ORAL | Status: DC
  Administered 2017-04-10 – 2017-04-27 (×29): 100 mg via ORAL
  Filled 2017-04-10 (×29): qty 1

## 2017-04-10 MED ORDER — NITROGLYCERIN 0.4 MG SL SUBL: 0.4000 mg | SUBLINGUAL_TABLET | SUBLINGUAL | Status: DC | PRN

## 2017-04-10 MED ORDER — ATORVASTATIN CALCIUM 20 MG PO TABS
20.0000 mg | ORAL_TABLET | Freq: Every day | ORAL | Status: DC
  Administered 2017-04-10 – 2017-04-20 (×11): 20 mg via ORAL
  Filled 2017-04-10 (×11): qty 1

## 2017-04-10 MED ORDER — SODIUM CHLORIDE 0.9 % IV SOLN
INTRAVENOUS | Status: DC
  Administered 2017-04-10: 05:00:00 via INTRAVENOUS

## 2017-04-10 MED ORDER — ESCITALOPRAM OXALATE 20 MG PO TABS
20.0000 mg | ORAL_TABLET | Freq: Every day | ORAL | Status: DC
  Administered 2017-04-10 – 2017-04-27 (×17): 20 mg via ORAL
  Filled 2017-04-10 (×3): qty 2
  Filled 2017-04-10: qty 1
  Filled 2017-04-10 (×2): qty 2
  Filled 2017-04-10: qty 1
  Filled 2017-04-10: qty 2
  Filled 2017-04-10 (×2): qty 1
  Filled 2017-04-10 (×6): qty 2
  Filled 2017-04-10: qty 1
  Filled 2017-04-10: qty 2

## 2017-04-10 MED ORDER — HYDRALAZINE HCL 50 MG PO TABS
25.0000 mg | ORAL_TABLET | Freq: Two times a day (BID) | ORAL | Status: DC
  Administered 2017-04-10 – 2017-04-16 (×15): 25 mg via ORAL
  Filled 2017-04-10 (×16): qty 1

## 2017-04-10 MED ORDER — LORATADINE 10 MG PO TABS: 5.0000 mg | ORAL_TABLET | Freq: Every day | ORAL | Status: DC | PRN

## 2017-04-10 MED ORDER — INSULIN GLARGINE 100 UNIT/ML ~~LOC~~ SOLN
24.0000 [IU] | Freq: Every day | SUBCUTANEOUS | Status: DC
  Administered 2017-04-10 – 2017-04-15 (×5): 24 [IU] via SUBCUTANEOUS
  Filled 2017-04-10 (×7): qty 0.24

## 2017-04-10 MED ORDER — POLYETHYLENE GLYCOL 3350 17 G PO PACK
17.0000 g | PACK | Freq: Every day | ORAL | Status: DC | PRN
  Administered 2017-04-16: 17 g via ORAL

## 2017-04-10 MED ORDER — GABAPENTIN 300 MG PO CAPS
300.0000 mg | ORAL_CAPSULE | Freq: Every day | ORAL | Status: DC
  Administered 2017-04-10 – 2017-04-16 (×7): 300 mg via ORAL
  Filled 2017-04-10 (×8): qty 1

## 2017-04-10 MED ORDER — WARFARIN SODIUM 3 MG PO TABS
3.0000 mg | ORAL_TABLET | ORAL | Status: DC
  Administered 2017-04-10 – 2017-04-15 (×5): 3 mg via ORAL
  Filled 2017-04-10 (×5): qty 1

## 2017-04-10 MED ORDER — ONDANSETRON HCL 4 MG PO TABS: 4.0000 mg | ORAL_TABLET | Freq: Four times a day (QID) | ORAL | Status: DC | PRN

## 2017-04-10 MED ORDER — CARBIDOPA-LEVODOPA ER 25-100 MG PO TBCR
2.0000 | EXTENDED_RELEASE_TABLET | Freq: Four times a day (QID) | ORAL | Status: DC
  Administered 2017-04-10 – 2017-04-17 (×30): 2 via ORAL
  Filled 2017-04-10 (×38): qty 2

## 2017-04-10 MED ORDER — ISOSORBIDE MONONITRATE ER 60 MG PO TB24
60.0000 mg | ORAL_TABLET | Freq: Every day | ORAL | Status: DC
  Administered 2017-04-10 – 2017-04-12 (×3): 60 mg via ORAL
  Filled 2017-04-10 (×3): qty 1

## 2017-04-10 MED ORDER — FUROSEMIDE 10 MG/ML IJ SOLN
40.0000 mg | Freq: Once | INTRAMUSCULAR | Status: AC
  Administered 2017-04-10: 40 mg via INTRAVENOUS
  Filled 2017-04-10: qty 4

## 2017-04-10 MED ORDER — VANCOMYCIN HCL IN DEXTROSE 750-5 MG/150ML-% IV SOLN
750.0000 mg | Freq: Once | INTRAVENOUS | Status: AC
  Administered 2017-04-10: 750 mg via INTRAVENOUS
  Filled 2017-04-10: qty 150

## 2017-04-10 MED ORDER — ACETAMINOPHEN 650 MG RE SUPP: 650.0000 mg | Freq: Four times a day (QID) | RECTAL | Status: DC | PRN

## 2017-04-10 MED ORDER — MIRABEGRON ER 25 MG PO TB24
25.0000 mg | ORAL_TABLET | Freq: Every day | ORAL | Status: DC
  Administered 2017-04-10 – 2017-04-16 (×7): 25 mg via ORAL
  Filled 2017-04-10 (×8): qty 1

## 2017-04-10 MED ORDER — CEFEPIME HCL 1 G IJ SOLR
1.0000 g | Freq: Two times a day (BID) | INTRAMUSCULAR | Status: DC
  Administered 2017-04-10 – 2017-04-11 (×3): 1 g via INTRAVENOUS
  Filled 2017-04-10 (×4): qty 1

## 2017-04-10 MED ORDER — METOPROLOL TARTRATE 50 MG PO TABS
50.0000 mg | ORAL_TABLET | Freq: Two times a day (BID) | ORAL | Status: DC
  Administered 2017-04-10 – 2017-04-12 (×6): 50 mg via ORAL
  Filled 2017-04-10 (×6): qty 1

## 2017-04-10 MED ORDER — TRAMADOL HCL 50 MG PO TABS
50.0000 mg | ORAL_TABLET | Freq: Every day | ORAL | Status: DC | PRN
  Administered 2017-04-16 – 2017-04-19 (×2): 50 mg via ORAL
  Filled 2017-04-10 (×2): qty 1

## 2017-04-10 MED ORDER — FUROSEMIDE 40 MG PO TABS
20.0000 mg | ORAL_TABLET | Freq: Every day | ORAL | Status: DC
  Administered 2017-04-10 – 2017-04-12 (×3): 20 mg via ORAL
  Filled 2017-04-10 (×3): qty 1

## 2017-04-10 MED ORDER — INSULIN ASPART 100 UNIT/ML ~~LOC~~ SOLN
0.0000 [IU] | Freq: Three times a day (TID) | SUBCUTANEOUS | Status: DC
  Administered 2017-04-10 (×3): 2 [IU] via SUBCUTANEOUS
  Administered 2017-04-11: 1 [IU] via SUBCUTANEOUS
  Administered 2017-04-11 – 2017-04-12 (×4): 2 [IU] via SUBCUTANEOUS
  Administered 2017-04-12 – 2017-04-13 (×2): 1 [IU] via SUBCUTANEOUS
  Administered 2017-04-13: 2 [IU] via SUBCUTANEOUS
  Administered 2017-04-14: 1 [IU] via SUBCUTANEOUS
  Administered 2017-04-14 (×2): 2 [IU] via SUBCUTANEOUS
  Administered 2017-04-15: 1 [IU] via SUBCUTANEOUS
  Administered 2017-04-15 – 2017-04-18 (×3): 2 [IU] via SUBCUTANEOUS
  Administered 2017-04-19: 7 [IU] via SUBCUTANEOUS
  Administered 2017-04-19 (×2): 1 [IU] via SUBCUTANEOUS
  Administered 2017-04-20: 3 [IU] via SUBCUTANEOUS
  Administered 2017-04-20: 2 [IU] via SUBCUTANEOUS
  Administered 2017-04-21 (×2): 3 [IU] via SUBCUTANEOUS
  Administered 2017-04-21: 1 [IU] via SUBCUTANEOUS
  Administered 2017-04-22: 2 [IU] via SUBCUTANEOUS
  Administered 2017-04-23: 3 [IU] via SUBCUTANEOUS
  Administered 2017-04-23: 2 [IU] via SUBCUTANEOUS
  Administered 2017-04-23: 3 [IU] via SUBCUTANEOUS
  Administered 2017-04-24: 2 [IU] via SUBCUTANEOUS
  Administered 2017-04-25: 1 [IU] via SUBCUTANEOUS
  Administered 2017-04-25: 2 [IU] via SUBCUTANEOUS
  Administered 2017-04-25: 1 [IU] via SUBCUTANEOUS
  Administered 2017-04-26 – 2017-04-27 (×3): 2 [IU] via SUBCUTANEOUS
  Filled 2017-04-10 (×37): qty 1

## 2017-04-10 MED ORDER — WARFARIN SODIUM 2 MG PO TABS
2.0000 mg | ORAL_TABLET | ORAL | Status: DC
  Administered 2017-04-11: 2 mg via ORAL
  Filled 2017-04-10: qty 1

## 2017-04-10 MED ORDER — LEVOFLOXACIN IN D5W 500 MG/100ML IV SOLN
500.0000 mg | INTRAVENOUS | Status: DC
Start: 2017-04-12 — End: 2017-04-13
  Administered 2017-04-12: 500 mg via INTRAVENOUS
  Filled 2017-04-10: qty 100

## 2017-04-10 MED ORDER — ONDANSETRON HCL 4 MG/2ML IJ SOLN
4.0000 mg | Freq: Four times a day (QID) | INTRAMUSCULAR | Status: DC | PRN
  Administered 2017-04-10 – 2017-04-26 (×11): 4 mg via INTRAVENOUS
  Filled 2017-04-10 (×11): qty 2

## 2017-04-10 MED ORDER — DEXTROSE 5 % IV SOLN: 1.0000 g | Freq: Three times a day (TID) | INTRAVENOUS | Status: DC

## 2017-04-10 MED ORDER — PRAMIPEXOLE DIHYDROCHLORIDE 0.25 MG PO TABS
0.2500 mg | ORAL_TABLET | Freq: Three times a day (TID) | ORAL | Status: DC
  Administered 2017-04-10 – 2017-04-15 (×17): 0.25 mg via ORAL
  Filled 2017-04-10 (×17): qty 1

## 2017-04-10 MED ORDER — LORAZEPAM 1 MG PO TABS
1.0000 mg | ORAL_TABLET | Freq: Two times a day (BID) | ORAL | Status: DC | PRN
  Administered 2017-04-10 – 2017-04-16 (×3): 1 mg via ORAL
  Filled 2017-04-10 (×3): qty 1

## 2017-04-10 MED ORDER — SACCHAROMYCES BOULARDII 250 MG PO CAPS
250.0000 mg | ORAL_CAPSULE | Freq: Every day | ORAL | Status: DC
  Administered 2017-04-10 – 2017-04-27 (×16): 250 mg via ORAL
  Filled 2017-04-10 (×22): qty 1

## 2017-04-10 MED ORDER — VANCOMYCIN HCL IN DEXTROSE 750-5 MG/150ML-% IV SOLN
750.0000 mg | INTRAVENOUS | Status: DC
  Administered 2017-04-10 – 2017-04-12 (×2): 750 mg via INTRAVENOUS
  Filled 2017-04-10 (×2): qty 150

## 2017-04-10 MED ORDER — LEVOFLOXACIN IN D5W 750 MG/150ML IV SOLN
750.0000 mg | Freq: Once | INTRAVENOUS | Status: AC
  Administered 2017-04-10: 750 mg via INTRAVENOUS
  Filled 2017-04-10: qty 150

## 2017-04-10 MED ORDER — ACETAMINOPHEN 325 MG PO TABS
650.0000 mg | ORAL_TABLET | Freq: Four times a day (QID) | ORAL | Status: DC | PRN
  Administered 2017-04-21 – 2017-04-25 (×4): 650 mg via ORAL
  Filled 2017-04-10 (×5): qty 2

## 2017-04-10 MED ORDER — SCOPOLAMINE 1 MG/3DAYS TD PT72
1.0000 | MEDICATED_PATCH | TRANSDERMAL | Status: DC
  Administered 2017-04-10 – 2017-04-16 (×3): 1.5 mg via TRANSDERMAL
  Filled 2017-04-10 (×3): qty 1

## 2017-04-10 NOTE — Progress Notes (Signed)
PHARMACIST - PHYSICIAN ORDER COMMUNICATION  CONCERNING: P&T Medication Policy on Herbal Medications  DESCRIPTION:  This patient's order for:  Co-Q-10  has been noted.  This product(s) is classified as an "herbal" or natural product. Due to a lack of definitive safety studies or FDA approval, nonstandard manufacturing practices, plus the potential risk of unknown drug-drug interactions while on inpatient medications, the Pharmacy and Therapeutics Committee does not permit the use of "herbal" or natural products of this type within [place].   ACTION TAKEN: The pharmacy department is unable to verify this order at this time. Please reevaluate patient's clinical condition at discharge and address if the herbal or natural product(s) should be resumed at that time.   

## 2017-04-10 NOTE — Progress Notes (Signed)
Patient is admitted to room 237 with the diagnosis of acute respiratory failure. Alert and oriented x 4. Denied any acute pain. No respiratory distress noted. Tele box called to CCMD  and was verified by Langston ReusingMarcel T. RN. Skin assessment done with Langston ReusingMarcel T. RN, noted abrasion on left arm and bruises on bilateral upper extremities. Full assessment to EPIC completed. Will continue to monitor.

## 2017-04-10 NOTE — Progress Notes (Addendum)
Pharmacy Antibiotic Note  Molly CunasJudy Wolf is a 76 y.o. female admitted on 04/09/2017 with pneumonia.  Pharmacy has been consulted for vancomycin dosing.  Plan: DW 68kg  Vd 48L kei 0.019 hr-1  T1/2 36 hours Vancomycin 750 mg q 36 hours ordered with stacked dosing. Level before 4th dose. Goal trough 15-20.  Levaquin 750 mg x1 followed by 500 mg q 48 hours ordered.   Height: 5\' 7"  (170.2 cm) Weight: 150 lb (68 kg) IBW/kg (Calculated) : 61.6  Temp (24hrs), Avg:97.9 F (36.6 C), Min:97.5 F (36.4 C), Max:98.4 F (36.9 C)   Recent Labs Lab 04/04/17 0731 04/04/17 1123 04/05/17 0453 04/06/17 0501 04/07/17 0436 04/08/17 0347 04/09/17 2221  WBC 5.8 4.7  --  4.5 4.9  --  4.9  CREATININE  --   --  2.66* 2.76* 2.52* 2.60* 2.53*    Estimated Creatinine Clearance: 18.1 mL/min (A) (by C-G formula based on SCr of 2.53 mg/dL (H)).    No Known Allergies  Antimicrobials this admission: vancomycin cefepime 6/30 >>    >>   Dose adjustments this admission:   Microbiology results: 6/30 BCx: pending 6/30 Sputum: pending       6/30 CXR: left base infiltrate vs. atelectasis Thank you for allowing pharmacy to be a part of this patient's care.  Molly Wolf S 04/10/2017 5:17 AM

## 2017-04-10 NOTE — Progress Notes (Signed)
Subjective: Patient readmitted after being home less than an hour. See H and P. Patient states she remembersarriving at home but nothing else till she arrived back at hospital. Says before discharge she felt like her COPD was acting up.  Objective: Vital signs in last 24 hours: Temp:  [97.7 F (36.5 C)-98.4 F (36.9 C)] 97.9 F (36.6 C) (06/30 1150) Pulse Rate:  [75-100] 100 (06/30 1150) Resp:  [10-25] 18 (06/30 1150) BP: (112-172)/(78-129) 152/97 (06/30 1150) SpO2:  [84 %-100 %] 100 % (06/30 1150) Weight:  [68 kg (150 lb)] 68 kg (150 lb) (06/30 0500) Weight change:  Last BM Date: 04/08/17  Intake/Output from previous day: 06/29 0701 - 06/30 0700 In: 200 [IV Piggyback:200] Out: -  Intake/Output this shift: Total I/O In: 240 [P.O.:240] Out: 300 [Urine:300]     CV: RRR, no mumurs Lungs: Mild scatterd rhonci. Abdomen: Soft, bowel sounds positive. General: Alert and orieted.  Lab Results:  Recent Labs  04/09/17 2221  WBC 4.9  HGB 10.0*  HCT 29.8*  PLT 187   BMET  Recent Labs  04/08/17 0347 04/09/17 2221  NA 137 137  K 5.1 5.6*  CL 108 106  CO2 25 22  GLUCOSE 186* 165*  BUN 51* 47*  CREATININE 2.60* 2.53*  CALCIUM 8.5* 8.9    Studies/Results: Ct Head Wo Contrast  Result Date: 04/09/2017 CLINICAL DATA:  76 year old female with weakness and altered mental status. EXAM: CT HEAD WITHOUT CONTRAST TECHNIQUE: Contiguous axial images were obtained from the base of the skull through the vertex without intravenous contrast. COMPARISON:  12/27/2016 and prior CT FINDINGS: Brain: No evidence of acute infarction, hemorrhage, hydrocephalus, extra-axial collection or mass lesion/mass effect. Atrophy, chronic small-vessel white matter ischemic changes and remote left caudate infarct again noted. Vascular: Intracranial atherosclerotic calcifications noted. Skull: Normal. Negative for fracture or focal lesion. Sinuses/Orbits: No acute finding. Other: None IMPRESSION: No evidence  of acute intracranial abnormality. Atrophy, chronic small-vessel white matter ischemic changes and remote left caudate infarct. Electronically Signed   By: Harmon PierJeffrey  Hu M.D.   On: 04/09/2017 23:38   Ct Chest Wo Contrast  Result Date: 04/10/2017 CLINICAL DATA:  Discharged this evening, now returning with weakness and unresponsiveness. EXAM: CT CHEST WITHOUT CONTRAST TECHNIQUE: Multidetector CT imaging of the chest was performed following the standard protocol without IV contrast. COMPARISON:  04/09/2017 FINDINGS: Cardiovascular: Cardiomegaly. Ascending aortic aneurysm, 4.8 cm-5.0. Arch and descending aorta are normal in caliber. Aortic and coronary atherosclerotic calcifications. Small pericardial effusion. Mediastinum/Nodes: No pathologic adenopathy in the mediastinum or hila. Lungs/Pleura: Small to moderate pleural effusions, left slightly larger than right. Posterior left lower lobe patchy airspace opacity could be atelectatic due to the adjacent effusion but cannot exclude a small focus of infectious infiltrate. Central airways are patent and normal in caliber. Mild mosaic attenuation in the upper lobes likely reflects air trapping. Upper Abdomen: No acute findings in the upper abdomen. Small volume perihepatic ascites. Musculoskeletal: No significant skeletal lesion. IMPRESSION: 1. Ascending thoracic aortic aneurysm. Recommend semi-annual imaging followup by CTA or MRA and referral to cardiothoracic surgery if not already obtained. This recommendation follows 2010 ACCF/AHA/AATS/ACR/ASA/SCA/SCAI/SIR/STS/SVM Guidelines for the Diagnosis and Management of Patients With Thoracic Aortic Disease. Circulation. 2010; 121: [id number] 2. Aortic and coronary atherosclerosis. 3. Small to moderate pleural effusions bilaterally. 4. Patchy airspace opacity in the posterior left lower lobe, atelectasis versus infectious infiltrate. Mild air trapping in the upper lobes. 5. Small volume ascites in the upper abdomen. Aortic  Atherosclerosis (ICD10-I70.0). Electronically Signed  By: Ellery Plunk M.D.   On: 04/10/2017 00:28   Dg Chest Portable 1 View  Result Date: 04/09/2017 CLINICAL DATA:  Weakness unresponsive EXAM: PORTABLE CHEST 1 VIEW COMPARISON:  04/03/2017 FINDINGS: Moderate cardiomegaly with central vascular congestion, increased compared to prior. Left basilar atelectasis or infiltrate. No pneumothorax. Old right-sided rib fractures. IMPRESSION: Cardiomegaly with increased central vascular congestion compared to prior. Increased opacity at the left lung base may reflect atelectasis or an infiltrate Electronically Signed   By: Jasmine Pang M.D.   On: 04/09/2017 22:51     Assessment/Plan: 1. Acute Respiratory Failure: Continue oxygen support. Not sure exactly what percipitated the event of unresponsiveness. CXR shows atelectasis vs infiltrate but is not impressive. Will wean oxygen as tolerated . 2. COPD: No symptoms of acute exacerbation except for patient's statement that she thought it was acting up before discharge. Continue nebs. 3. ? Pneumonia: Continue abx . Follow up repeat CXR tomorrow. 4. Hx CHF: No pulmonary edema on CXR. Continue home meds. 5. Hyperkalemia: Given lasix and kayexolate. Recheck potassium in am. 6. Stage 4 CRF: Cr stable.  LOS: 0 days   Gracelyn Nurse 04/10/2017, 6:06 PM

## 2017-04-10 NOTE — H&P (Signed)
Molly Wolf is an 76 y.o. female.   Chief Complaint: Unresponsiveness HPI: The patient with past medical history of CHF, atrial fibrillation, chronic kidney disease, diabetes and Parkinson's disease presents to the emergency department via EMS after an episode of possible cardiac arrest at home. The patient had just been discharged from the hospital about one hour before after hospitalization for CHF exacerbation. She was found unresponsive at home by her grandson who reportedly performed CPR. When EMS arrived the patient had a pulse but was reportedly hypoxic. In the emergency department the patient was alert and oriented with pulse oximetry of 97% on room air. However, the patient eventually had a desaturation to 84% that improved with 2 L oxygen via nasal cannula. Due to her episode of unresponsiveness and unstable respiratory status the emergency department staff called the hospitalist service for admission.  Past Medical History:  Diagnosis Date  . Anxiety   . Arthritis   . Chronic combined systolic and diastolic CHF (congestive heart failure) ([place])    a. 01/2008 Echo ([facility]): EF > 55%, mod-sev LVH w/ grade 1 DD, biatrial enlargement, mild AS, trace MR/TR; b. EF 45-50%, GR2DD, mild to moderate aortic stenosis, mild aortic insufficieny, mild to moderate mitral regurgitation, mild tricuspid regurgitation, mild biatrial enlargement, and a small pericardial effusion  . CKD (chronic kidney disease), stage IV ([place])   . Closed patellar sleeve fracture of right knee    a. 01/2017 - conservatively managed.  Marland Kitchen COPD (chronic obstructive pulmonary disease) ([place])   . Coronary artery disease    a. s/p remote stenting of unknown vessel @ [facility].  . Depression   . Diabetes mellitus with complication ([place])   . DVT (deep venous thrombosis) ([facility])    a. in the setting of right patellar fracture on Coumadin  . Hyperlipidemia   . Hypertension   . Parkinson disease [facility])     Past Surgical History:  Procedure  Laterality Date  . BACK SURGERY    . CORONARY ANGIOPLASTY WITH STENT PLACEMENT    . NECK SURGERY      Family History  Problem Relation Age of Onset  . Cervical cancer Mother   . Kidney failure Father   . CAD Father   . CAD Brother   . Prostate cancer Neg Hx   . Kidney cancer Neg Hx   . Bladder Cancer Neg Hx    Social History:  reports that she has never smoked. She has never used smokeless tobacco. She reports that she does not drink alcohol or use drugs.  Allergies: No Known Allergies  Medications Prior to Admission  Medication Sig Dispense Refill  . acetaminophen (TYLENOL) 325 MG tablet Take 650 mg by mouth every 6 (six) hours as needed for mild pain.    Marland Kitchen atorvastatin (LIPITOR) 20 MG tablet Take 20 mg by mouth daily.    . blood glucose meter kit and supplies KIT Dispense based on patient and insurance preference. Use up to four times daily as directed. (FOR ICD-9 250.00, 250.01). 1 each 0  . Carbidopa-Levodopa ER (SINEMET CR) 25-100 MG tablet controlled release Take 2 tablets by mouth 4 (four) times daily. 240 tablet 1  . Coenzyme Q10 (COQ10) 50 MG CAPS Take 50 mg by mouth daily.    Marland Kitchen CRANBERRY PO Take 1 tablet by mouth daily.    Marland Kitchen escitalopram (LEXAPRO) 20 MG tablet Take 20 mg by mouth daily.    . furosemide (LASIX) 20 MG tablet Take 1 tablet (20 mg total) by mouth  daily. 30 tablet 2  . gabapentin (NEURONTIN) 300 MG capsule Take 300 mg by mouth daily.     Marland Kitchen glucose blood (ACCU-CHEK AVIVA PLUS) test strip Use as instructed 100 each 12  . hydrALAZINE (APRESOLINE) 25 MG tablet Take 1 tablet (25 mg total) by mouth 2 (two) times daily. 180 tablet 3  . insulin glargine (LANTUS) 100 UNIT/ML injection Inject 40 Units into the skin at bedtime.     . insulin lispro (HUMALOG) 100 UNIT/ML injection Inject 6 Units into the skin 3 (three) times daily with meals.     . isosorbide mononitrate (IMDUR) 60 MG 24 hr tablet Take 1 tablet (60 mg total) by mouth daily. 30 tablet 2  . loratadine  (CLARITIN) 10 MG tablet Take 5 mg by mouth daily as needed for allergies.     Marland Kitchen LORazepam (ATIVAN) 1 MG tablet Take 1 tablet (1 mg total) by mouth 2 (two) times daily as needed for anxiety. 5 tablet 0  . metoprolol tartrate (LOPRESSOR) 50 MG tablet Take 1 tablet (50 mg total) by mouth 2 (two) times daily. 180 tablet 3  . mirabegron ER (MYRBETRIQ) 25 MG TB24 tablet Take 25 mg by mouth daily.    . nitroGLYCERIN (NITROSTAT) 0.4 MG SL tablet Place 0.4 mg under the tongue every 5 (five) minutes as needed for chest pain.    . Omega-3 Fatty Acids (FISH OIL) 1000 MG CAPS Take 1,000 mg by mouth daily.    . polyethylene glycol (MIRALAX / GLYCOLAX) packet Take 17 g by mouth daily as needed for mild constipation.    . promethazine (PHENERGAN) 25 MG tablet Take 25 mg by mouth every 6 (six) hours as needed for nausea or vomiting.    . saccharomyces boulardii (FLORASTOR) 250 MG capsule Take 250 mg by mouth daily.    . traMADol (ULTRAM) 50 MG tablet Take 1 tablet (50 mg total) by mouth daily as needed. (Patient taking differently: Take 50 mg by mouth daily as needed for moderate pain. ) 5 tablet 0  . warfarin (COUMADIN) 2 MG tablet Take 1 tablet (2 mg total) by mouth as directed. Every Sunday 30 tablet 0  . warfarin (COUMADIN) 3 MG tablet Take 1 tablet (3 mg total) by mouth daily at 6 PM. Mon,tue,wed,thur,friday, sat 30 tablet 1    Results for orders placed or performed during the hospital encounter of 04/09/17 (from the past 48 hour(s))  CBC with Differential/Platelet     Status: Abnormal   Collection Time: 04/09/17 10:21 PM  Result Value Ref Range   WBC 4.9 3.6 - 11.0 K/uL   RBC 3.45 (L) 3.80 - 5.20 MIL/uL   Hemoglobin 10.0 (L) 12.0 - 16.0 g/dL   HCT 29.8 (L) 35.0 - 47.0 %   MCV 86.4 80.0 - 100.0 fL   MCH 29.0 26.0 - 34.0 pg   MCHC 33.6 32.0 - 36.0 g/dL   RDW 14.8 (H) 11.5 - 14.5 %   Platelets 187 150 - 440 K/uL   Neutrophils Relative % 68 %   Neutro Abs 3.3 1.4 - 6.5 K/uL   Lymphocytes Relative 21 %    Lymphs Abs 1.0 1.0 - 3.6 K/uL   Monocytes Relative 7 %   Monocytes Absolute 0.3 0.2 - 0.9 K/uL   Eosinophils Relative 3 %   Eosinophils Absolute 0.1 0 - 0.7 K/uL   Basophils Relative 1 %   Basophils Absolute 0.1 0 - 0.1 K/uL  Comprehensive metabolic panel     Status: Abnormal  Collection Time: 04/09/17 10:21 PM  Result Value Ref Range   Sodium 137 135 - 145 mmol/L   Potassium 5.6 (H) 3.5 - 5.1 mmol/L   Chloride 106 101 - 111 mmol/L   CO2 22 22 - 32 mmol/L   Glucose, Bld 165 (H) 65 - 99 mg/dL   BUN 47 (H) 6 - 20 mg/dL   Creatinine, Ser 2.53 (H) 0.44 - 1.00 mg/dL   Calcium 8.9 8.9 - 10.3 mg/dL   Total Protein 6.5 6.5 - 8.1 g/dL   Albumin 3.4 (L) 3.5 - 5.0 g/dL   AST 23 15 - 41 U/L   ALT <5 (L) 14 - 54 U/L   Alkaline Phosphatase 63 38 - 126 U/L   Total Bilirubin 1.1 0.3 - 1.2 mg/dL   GFR calc non Af Amer 17 (L) >60 mL/min   GFR calc Af Amer 20 (L) >60 mL/min    Comment: (NOTE) The eGFR has been calculated using the CKD EPI equation. This calculation has not been validated in all clinical situations. eGFR's persistently <60 mL/min signify possible Chronic Kidney Disease.    Anion gap 9 5 - 15  Troponin I     Status: None   Collection Time: 04/09/17 10:21 PM  Result Value Ref Range   Troponin I <0.03 <0.03 ng/mL  Ethanol     Status: None   Collection Time: 04/09/17 10:37 PM  Result Value Ref Range   Alcohol, Ethyl (B) <5 <5 mg/dL    Comment:        LOWEST DETECTABLE LIMIT FOR SERUM ALCOHOL IS 5 mg/dL FOR MEDICAL PURPOSES ONLY   Acetaminophen level     Status: Abnormal   Collection Time: 04/09/17 10:37 PM  Result Value Ref Range   Acetaminophen (Tylenol), Serum <10 (L) 10 - 30 ug/mL    Comment:        THERAPEUTIC CONCENTRATIONS VARY SIGNIFICANTLY. A RANGE OF 10-30 ug/mL MAY BE AN EFFECTIVE CONCENTRATION FOR MANY PATIENTS. HOWEVER, SOME ARE BEST TREATED AT CONCENTRATIONS OUTSIDE THIS RANGE. ACETAMINOPHEN CONCENTRATIONS >150 ug/mL AT 4 HOURS AFTER INGESTION AND  >50 ug/mL AT 12 HOURS AFTER INGESTION ARE OFTEN ASSOCIATED WITH TOXIC REACTIONS.   Salicylate level     Status: None   Collection Time: 04/09/17 10:37 PM  Result Value Ref Range   Salicylate Lvl <8.3 2.8 - 30.0 mg/dL  TSH     Status: None   Collection Time: 04/10/17  3:33 AM  Result Value Ref Range   TSH 2.925 0.350 - 4.500 uIU/mL    Comment: Performed by a 3rd Generation assay with a functional sensitivity of <=0.01 uIU/mL.  Troponin I     Status: None   Collection Time: 04/10/17  3:33 AM  Result Value Ref Range   Troponin I <0.03 <0.03 ng/mL   Ct Head Wo Contrast  Result Date: 04/09/2017 CLINICAL DATA:  76 year old female with weakness and altered mental status. EXAM: CT HEAD WITHOUT CONTRAST TECHNIQUE: Contiguous axial images were obtained from the base of the skull through the vertex without intravenous contrast. COMPARISON:  12/27/2016 and prior CT FINDINGS: Brain: No evidence of acute infarction, hemorrhage, hydrocephalus, extra-axial collection or mass lesion/mass effect. Atrophy, chronic small-vessel white matter ischemic changes and remote left caudate infarct again noted. Vascular: Intracranial atherosclerotic calcifications noted. Skull: Normal. Negative for fracture or focal lesion. Sinuses/Orbits: No acute finding. Other: None IMPRESSION: No evidence of acute intracranial abnormality. Atrophy, chronic small-vessel white matter ischemic changes and remote left caudate infarct. Electronically Signed   By:  Margarette Canada M.D.   On: 04/09/2017 23:38   Ct Chest Wo Contrast  Result Date: 04/10/2017 CLINICAL DATA:  Discharged this evening, now returning with weakness and unresponsiveness. EXAM: CT CHEST WITHOUT CONTRAST TECHNIQUE: Multidetector CT imaging of the chest was performed following the standard protocol without IV contrast. COMPARISON:  04/09/2017 FINDINGS: Cardiovascular: Cardiomegaly. Ascending aortic aneurysm, 4.8 cm-5.0. Arch and descending aorta are normal in caliber.  Aortic and coronary atherosclerotic calcifications. Small pericardial effusion. Mediastinum/Nodes: No pathologic adenopathy in the mediastinum or hila. Lungs/Pleura: Small to moderate pleural effusions, left slightly larger than right. Posterior left lower lobe patchy airspace opacity could be atelectatic due to the adjacent effusion but cannot exclude a small focus of infectious infiltrate. Central airways are patent and normal in caliber. Mild mosaic attenuation in the upper lobes likely reflects air trapping. Upper Abdomen: No acute findings in the upper abdomen. Small volume perihepatic ascites. Musculoskeletal: No significant skeletal lesion. IMPRESSION: 1. Ascending thoracic aortic aneurysm. Recommend semi-annual imaging followup by CTA or MRA and referral to cardiothoracic surgery if not already obtained. This recommendation follows 2010 ACCF/AHA/AATS/ACR/ASA/SCA/SCAI/SIR/STS/SVM Guidelines for the Diagnosis and Management of Patients With Thoracic Aortic Disease. Circulation. 2010; 121: X528-U132 2. Aortic and coronary atherosclerosis. 3. Small to moderate pleural effusions bilaterally. 4. Patchy airspace opacity in the posterior left lower lobe, atelectasis versus infectious infiltrate. Mild air trapping in the upper lobes. 5. Small volume ascites in the upper abdomen. Aortic Atherosclerosis (ICD10-I70.0). Electronically Signed   By: Andreas Newport M.D.   On: 04/10/2017 00:28   Dg Chest Portable 1 View  Result Date: 04/09/2017 CLINICAL DATA:  Weakness unresponsive EXAM: PORTABLE CHEST 1 VIEW COMPARISON:  04/03/2017 FINDINGS: Moderate cardiomegaly with central vascular congestion, increased compared to prior. Left basilar atelectasis or infiltrate. No pneumothorax. Old right-sided rib fractures. IMPRESSION: Cardiomegaly with increased central vascular congestion compared to prior. Increased opacity at the left lung base may reflect atelectasis or an infiltrate Electronically Signed   By: Donavan Foil  M.D.   On: 04/09/2017 22:51    Review of Systems  Constitutional: Negative for chills and fever.  HENT: Negative for sore throat and tinnitus.   Eyes: Negative for blurred vision and redness.  Respiratory: Positive for cough and shortness of breath.   Cardiovascular: Negative for chest pain, palpitations, orthopnea and PND.  Gastrointestinal: Negative for abdominal pain, diarrhea, nausea and vomiting.  Genitourinary: Negative for dysuria, frequency and urgency.  Musculoskeletal: Negative for joint pain and myalgias.  Skin: Negative for rash.       No lesions  Neurological: Positive for weakness. Negative for speech change and focal weakness.  Endo/Heme/Allergies: Does not bruise/bleed easily.       No temperature intolerance  Psychiatric/Behavioral: Negative for depression and suicidal ideas.    Blood pressure (!) 139/94, pulse 86, temperature 97.7 F (36.5 C), temperature source Oral, resp. rate 18, height 5' 7"  (1.702 m), weight 68 kg (150 lb), SpO2 93 %. Physical Exam  Vitals reviewed. Constitutional: She is oriented to person, place, and time. She appears well-developed and well-nourished. No distress.  HENT:  Head: Normocephalic and atraumatic.  Mouth/Throat: Oropharynx is clear and moist.  Eyes: Conjunctivae and EOM are normal. Pupils are equal, round, and reactive to light. No scleral icterus.  Neck: Normal range of motion. Neck supple. No JVD present. No tracheal deviation present. No thyromegaly present.  Cardiovascular: Normal rate, regular rhythm and normal heart sounds.  Exam reveals no gallop and no friction rub.   No murmur heard. Respiratory:  Effort normal and breath sounds normal.  GI: Soft. Bowel sounds are normal. She exhibits no distension. There is no tenderness.  Genitourinary:  Genitourinary Comments: Deferred  Musculoskeletal: Normal range of motion. She exhibits no edema.  Lymphadenopathy:    She has no cervical adenopathy.  Neurological: She is alert  and oriented to person, place, and time. No cranial nerve deficit. She exhibits normal muscle tone.  Skin: Skin is warm and dry. No rash noted. No erythema.  Psychiatric: She has a normal mood and affect. Her behavior is normal. Judgment and thought content normal.     Assessment/Plan This is a 76 year old female admitted for acute respiratory failure. 1. Respiratory failure: Acute; with hypoxia. Chest x-ray is not consistent with acute pulmonary edema as one might expect due to her recent admission for CHF exacerbation. There is an area of focal opacity in left lung base as well as right middle lobe (my read) that likely represents pneumonia. Supplemental oxygen as needed. 2. Pneumonia: Healthcare associated; I have started the patient on cefepime, vancomycin and levofloxacin. 3. Hypokalemia: Administer Lasix and Kayexalate 4. Diabetes mellitus type 2: Continue basal insulin adjusted for hospital diet. Sliding-scale insulin as well. Hold metformin 5. CKD: Stage IV; hydrate with intravenous fluid. Avoid nephrotoxic agents. 6. Atrial fibrillation: Rate controlled; continue metoprolol. Warfarin for anticoagulation 7. CHF: Combined systolic and diastolic; chronic. The patient does not appear to be in exacerbation right now. Continue metoprolol and oral Lasix per home regimen 8. Hypertension: Controlled; continue hydralazine 9. CAD: Stable; continue Imdur 10. Parkinson's: Continue Sinemet. The patient may also have trouble controlling her secretions. I have added pramipexole and scopolamine 11. DVT prophylaxis: Full dose anticoagulation as above 12. GI prophylaxis: None The patient is a full code. Time spent on admission orders and patient care approximately 45 minutes  Harrie Foreman, MD 04/10/2017, 5:34 AM

## 2017-04-10 NOTE — ED Notes (Signed)
Pt transport to [phone] 

## 2017-04-10 NOTE — Progress Notes (Signed)
Patient has not urinated since admitted and had received Lasix 40 mg IV. Patient toileted and she had BM but didn't Urinate.  Her abdomen  Is soft nondistended. Bladder scan done received a reading of 293 mL. Will continue to monitor.

## 2017-04-11 ENCOUNTER — Inpatient Hospital Stay: Payer: Medicare Other

## 2017-04-11 LAB — BASIC METABOLIC PANEL
ANION GAP: 5 (ref 5–15)
BUN: 46 mg/dL — AB (ref 6–20)
CALCIUM: 8.6 mg/dL — AB (ref 8.9–10.3)
CO2: 23 mmol/L (ref 22–32)
CREATININE: 2.59 mg/dL — AB (ref 0.44–1.00)
Chloride: 109 mmol/L (ref 101–111)
GFR calc Af Amer: 19 mL/min — ABNORMAL LOW (ref >60)
GFR, EST NON AFRICAN AMERICAN: 17 mL/min — AB (ref >60)
GLUCOSE: 139 mg/dL — AB (ref 65–99)
Potassium: 4.5 mmol/L (ref 3.5–5.1)
Sodium: 137 mmol/L (ref 135–145)

## 2017-04-11 LAB — GLUCOSE, CAPILLARY
Glucose-Capillary: 130 mg/dL — ABNORMAL HIGH (ref 65–99)
Glucose-Capillary: 163 mg/dL — ABNORMAL HIGH (ref 65–99)
Glucose-Capillary: 168 mg/dL — ABNORMAL HIGH (ref 65–99)
Glucose-Capillary: 181 mg/dL — ABNORMAL HIGH (ref 65–99)

## 2017-04-11 LAB — PROTIME-INR
INR: 2.13
PROTHROMBIN TIME: 24.2 s — AB (ref 11.4–15.2)

## 2017-04-11 MED ORDER — DEXTROSE 5 % IV SOLN
1.0000 g | INTRAVENOUS | Status: DC
  Administered 2017-04-12: 1 g via INTRAVENOUS
  Filled 2017-04-11 (×2): qty 1

## 2017-04-11 MED ORDER — ENSURE ENLIVE PO LIQD
237.0000 mL | Freq: Two times a day (BID) | ORAL | Status: DC
  Administered 2017-04-11 – 2017-04-16 (×10): 237 mL via ORAL

## 2017-04-11 NOTE — Progress Notes (Signed)
SATURATION QUALIFICATIONS: (This note is used to comply with regulatory documentation for home oxygen)  Patient Saturations on Room Air at Rest = 92%  Patient Saturations on Room Air with exertion = 82%  Patient Saturations on  Liters of oxygen while Ambulating = %  Please briefly explain why patient needs home oxygen:

## 2017-04-11 NOTE — Progress Notes (Signed)
Cefepime Renal Adjustment  Changed Cefepime from 1 g IV q12 hour to q24 hours based on CrCl.   Demetrius Charityeldrin D. Derrisha Foos, PharmD

## 2017-04-11 NOTE — Progress Notes (Signed)
Initial Nutrition Assessment  DOCUMENTATION CODES:   Not applicable  INTERVENTION:  Recommended downgrading diet to dysphagia 3 with thins, but patient refusing at this time. Also refusing any assistance with meals, which it appears she would benefit from.  Provide Ensure Enlive po BID, each supplement provides 350 kcal and 20 grams of protein. Patient prefers chocolate.  Provide Magic cup TID with meals, each supplement provides 290 kcal and 9 grams of protein.   NUTRITION DIAGNOSIS:   Inadequate oral intake related to poor appetite, nausea, other (see comment) (difficulty chewing and swallowing) as evidenced by per patient/family report.  GOAL:   Patient will meet greater than or equal to 90% of their needs  MONITOR:   PO intake, Supplement acceptance, Labs, Weight trends, I & O's  REASON FOR ASSESSMENT:   Malnutrition Screening Tool    ASSESSMENT:   76 year old female with PMHx of HTN, CAD, Parkinson's disease, HLD, anxiety, depression, CKD stage IV, COPD, chronic combined systolic and diastolic CHF, DM type 2, hx of DVT who presented after an episode of unresponsiveness. Patient with recent admission 6/23-6/29 for acute on chronic CHF, acute on chronic kidney disease.   Spoke with patient at bedside. She reports her appetite has been poor for a while now. She gets 3 meals per day but cannot eat well. Patient unable to report how much she typically eats in a day. She was attempting to eat lunch on assessment. Had grilled chicken sandwich, [place] fries, grapes, peaches, milk, and tea. Patient repots the only thing she has had so far is one [place] fry, and sips of tea and milk. She is having nausea from the smell of the food. Also reports difficulty chewing/swallowing. Offered to provide mechanical soft diet (dysphagia 3) but patient is refusing. Patient appears to have trouble eating in setting of her Parkinson's, but she reports she does not need any assistance with  meals.  Patient reports her UBW was 260 lbs and she last weighed this approximately one year ago. Do not see weights that high in chart back to 2016. Per chart patient was 165.6 lbs on 12/27/2016 and has lost 10.6 lbs (6.4% body weight) over the past 3.5 months, which is not significant for time frame.  Medications reviewed and include: Colace, Lasix 20 mg daily, Novolog 0-9 units TID, Lantus 24 units QHS, warfarin, cefepime, levofloxacin, vancomycin.  Labs reviewed: CBG 130-163 past 24 hrs, BUN 46, Creatinine 2.59, EGFR 17.  Nutrition-Focused physical exam completed. Findings are mild-moderate fat depletion, mild-moderate muscle depletion, and mild edema.   Patient does not meet criteria for malnutrition at this time.  Discussed with RN.  Diet Order:  Diet heart healthy/carb modified Room service appropriate? Yes; Fluid consistency: Thin  Skin:  Wound (see comment) (MSAD to buttocks)  Last BM:  04/10/2017 - type 7  Height:   Ht Readings from Last 1 Encounters:  04/09/17 5' 7"  (1.702 m)    Weight:   Wt Readings from Last 1 Encounters:  04/11/17 155 lb (70.3 kg)    Ideal Body Weight:  61.4 kg  BMI:  Body mass index is 24.28 kg/m.  Estimated Nutritional Needs:   Kcal:  1470-1715 (MSJ x 1.2-1.4)  Protein:  70-85 grams (1-1.2 grams/kg)  Fluid:  1.7 L/day (25 ml/kg)  EDUCATION NEEDS:   No education needs identified at this time  Willey Blade, MS, RD, LDN Pager: [phone] After Hours Pager: [phone]

## 2017-04-11 NOTE — Progress Notes (Signed)
Subjective: Patient says she feels weak today. No specific complaints.  Objective: Vital signs in last 24 hours: Temp:  [97.8 F (36.6 C)-98.5 F (36.9 C)] 98.2 F (36.8 C) (07/01 1154) Pulse Rate:  [89-121] 112 (07/01 1154) Resp:  [16-18] 16 (07/01 1154) BP: (116-151)/(77-112) 116/80 (07/01 1154) SpO2:  [92 %-99 %] 92 % (07/01 1154) Weight:  [70.3 kg (155 lb)] 70.3 kg (155 lb) (07/01 0445) Weight change: 2.268 kg (5 lb) Last BM Date: 04/10/17  Intake/Output from previous day: 06/30 0701 - 07/01 0700 In: 240 [P.O.:240] Out: 700 [Urine:700] Intake/Output this shift: No intake/output data recorded.  General appearance: no distress Head: Normocephalic, without obvious abnormality, atraumatic Resp: clear to auscultation bilaterally and normal percussion bilaterally Cardio: regular rate and rhythm GI: soft, non-tender; bowel sounds normal; no masses,  no organomegaly Extremities: no edema, redness or tenderness in the calves or thighs Neurologic: Alert and oriented X 3, normal strength and tone. Normal symmetric reflexes. Normal coordination and gait  Lab Results:  Recent Labs  04/09/17 2221  WBC 4.9  HGB 10.0*  HCT 29.8*  PLT 187   BMET  Recent Labs  04/09/17 2221 04/11/17 0322  NA 137 137  K 5.6* 4.5  CL 106 109  CO2 22 23  GLUCOSE 165* 139*  BUN 47* 46*  CREATININE 2.53* 2.59*  CALCIUM 8.9 8.6*    Studies/Results: Dg Chest 2 View  Result Date: 04/11/2017 CLINICAL DATA:  Pneumonia EXAM: CHEST  2 VIEW COMPARISON:  04/09/2017 FINDINGS: Cardiomegaly with vascular congestion. Small bilateral pleural effusions. Left lower lobe atelectasis or infiltrate, similar prior study. IMPRESSION: Left lower lobe atelectasis or infiltrate, unchanged. Small bilateral effusions. Cardiomegaly. Electronically Signed   By: Charlett NoseKevin  Dover M.D.   On: 04/11/2017 10:47   Ct Head Wo Contrast  Result Date: 04/09/2017 CLINICAL DATA:  76 year old female with weakness and altered mental  status. EXAM: CT HEAD WITHOUT CONTRAST TECHNIQUE: Contiguous axial images were obtained from the base of the skull through the vertex without intravenous contrast. COMPARISON:  12/27/2016 and prior CT FINDINGS: Brain: No evidence of acute infarction, hemorrhage, hydrocephalus, extra-axial collection or mass lesion/mass effect. Atrophy, chronic small-vessel white matter ischemic changes and remote left caudate infarct again noted. Vascular: Intracranial atherosclerotic calcifications noted. Skull: Normal. Negative for fracture or focal lesion. Sinuses/Orbits: No acute finding. Other: None IMPRESSION: No evidence of acute intracranial abnormality. Atrophy, chronic small-vessel white matter ischemic changes and remote left caudate infarct. Electronically Signed   By: Harmon PierJeffrey  Hu M.D.   On: 04/09/2017 23:38   Ct Chest Wo Contrast  Result Date: 04/10/2017 CLINICAL DATA:  Discharged this evening, now returning with weakness and unresponsiveness. EXAM: CT CHEST WITHOUT CONTRAST TECHNIQUE: Multidetector CT imaging of the chest was performed following the standard protocol without IV contrast. COMPARISON:  04/09/2017 FINDINGS: Cardiovascular: Cardiomegaly. Ascending aortic aneurysm, 4.8 cm-5.0. Arch and descending aorta are normal in caliber. Aortic and coronary atherosclerotic calcifications. Small pericardial effusion. Mediastinum/Nodes: No pathologic adenopathy in the mediastinum or hila. Lungs/Pleura: Small to moderate pleural effusions, left slightly larger than right. Posterior left lower lobe patchy airspace opacity could be atelectatic due to the adjacent effusion but cannot exclude a small focus of infectious infiltrate. Central airways are patent and normal in caliber. Mild mosaic attenuation in the upper lobes likely reflects air trapping. Upper Abdomen: No acute findings in the upper abdomen. Small volume perihepatic ascites. Musculoskeletal: No significant skeletal lesion. IMPRESSION: 1. Ascending thoracic  aortic aneurysm. Recommend semi-annual imaging followup by CTA or MRA and  referral to cardiothoracic surgery if not already obtained. This recommendation follows 2010 ACCF/AHA/AATS/ACR/ASA/SCA/SCAI/SIR/STS/SVM Guidelines for the Diagnosis and Management of Patients With Thoracic Aortic Disease. Circulation. 2010; 121: [id number] 2. Aortic and coronary atherosclerosis. 3. Small to moderate pleural effusions bilaterally. 4. Patchy airspace opacity in the posterior left lower lobe, atelectasis versus infectious infiltrate. Mild air trapping in the upper lobes. 5. Small volume ascites in the upper abdomen. Aortic Atherosclerosis (ICD10-I70.0). Electronically Signed   By: Ellery Plunk M.D.   On: 04/10/2017 00:28   Dg Chest Portable 1 View  Result Date: 04/09/2017 CLINICAL DATA:  Weakness unresponsive EXAM: PORTABLE CHEST 1 VIEW COMPARISON:  04/03/2017 FINDINGS: Moderate cardiomegaly with central vascular congestion, increased compared to prior. Left basilar atelectasis or infiltrate. No pneumothorax. Old right-sided rib fractures. IMPRESSION: Cardiomegaly with increased central vascular congestion compared to prior. Increased opacity at the left lung base may reflect atelectasis or an infiltrate Electronically Signed   By: Jasmine Pang M.D.   On: 04/09/2017 22:51    Medications: I have reviewed the patient's current medications.  Assessment/Plan: 1. Acute Respiratory Failure: Resolved. Patient has been weaned off oxygen.  2. COPD: No symptoms of acute exacerbation except for patient's statement that she thought it was acting up before discharge. Continue nebs as scheduled. 3. ? Pneumonia: PA and lateral follow-up chest x-ray today which shows atelectasis versus infiltrate. We'll continue cefepime for now. Likely stop antibiotics soon.  4. Hx CHF: No pulmonary edema on CXR. Continue home meds. 5. Hyperkalemia: Resolved  6. Stage 4 CRF: Cr stable. 7. Atrial fibrillation. Rate is controlled and she is  on anticoagulation.  Disposition. Not sure what caused her unresponsive episode when she returned home. Workup negative here so far. We'll go ahead and get physical therapy to evaluate her today. She may need SNIF placement.  Total time spent 30 minutes  LOS: 1 day   Gracelyn Nurse 04/11/2017, 12:13 PM

## 2017-04-11 NOTE — Plan of Care (Signed)
Problem: Fluid Volume: Goal: Ability to maintain a balanced intake and output will improve Outcome: Not Progressing Patient experienced episode of nausea/vomiting today after attempting to eat lunch. Zofran was given. Will continue to monitor.

## 2017-04-12 ENCOUNTER — Encounter: Payer: Self-pay | Admitting: Gastroenterology

## 2017-04-12 DIAGNOSIS — I481 Persistent atrial fibrillation: Secondary | ICD-10-CM

## 2017-04-12 DIAGNOSIS — I5023 Acute on chronic systolic (congestive) heart failure: Secondary | ICD-10-CM

## 2017-04-12 LAB — GLUCOSE, CAPILLARY
Glucose-Capillary: 150 mg/dL — ABNORMAL HIGH (ref 65–99)
Glucose-Capillary: 155 mg/dL — ABNORMAL HIGH (ref 65–99)
Glucose-Capillary: 159 mg/dL — ABNORMAL HIGH (ref 65–99)
Glucose-Capillary: 121 mg/dL — ABNORMAL HIGH (ref 65–99)
Glucose-Capillary: 140 mg/dL — ABNORMAL HIGH (ref 65–99)

## 2017-04-12 LAB — PROTIME-INR
INR: 2.55
PROTHROMBIN TIME: 27.9 s — AB (ref 11.4–15.2)

## 2017-04-12 LAB — CBC
HCT: 27.6 % — ABNORMAL LOW (ref 35.0–47.0)
HEMOGLOBIN: 9.2 g/dL — AB (ref 12.0–16.0)
MCH: 28.3 pg (ref 26.0–34.0)
MCHC: 33.4 g/dL (ref 32.0–36.0)
MCV: 84.7 fL (ref 80.0–100.0)
PLATELETS: 148 10*3/uL — AB (ref 150–440)
RBC: 3.25 MIL/uL — AB (ref 3.80–5.20)
RDW: 15.5 % — ABNORMAL HIGH (ref 11.5–14.5)
WBC: 5 10*3/uL (ref 3.6–11.0)

## 2017-04-12 LAB — BASIC METABOLIC PANEL
ANION GAP: 7 (ref 5–15)
BUN: 45 mg/dL — AB (ref 6–20)
CHLORIDE: 106 mmol/L (ref 101–111)
CO2: 25 mmol/L (ref 22–32)
Calcium: 8.7 mg/dL — ABNORMAL LOW (ref 8.9–10.3)
Creatinine, Ser: 2.64 mg/dL — ABNORMAL HIGH (ref 0.44–1.00)
GFR calc Af Amer: 19 mL/min — ABNORMAL LOW (ref >60)
GFR, EST NON AFRICAN AMERICAN: 16 mL/min — AB (ref >60)
Glucose, Bld: 171 mg/dL — ABNORMAL HIGH (ref 65–99)
POTASSIUM: 4.7 mmol/L (ref 3.5–5.1)
SODIUM: 138 mmol/L (ref 135–145)

## 2017-04-12 LAB — PROCALCITONIN: PROCALCITONIN: 0.11 ng/mL

## 2017-04-12 LAB — FIBRIN DERIVATIVES D-DIMER (ARMC ONLY): FIBRIN DERIVATIVES D-DIMER (ARMC): 867.15 — AB (ref 0.00–499.00)

## 2017-04-12 MED ORDER — FUROSEMIDE 10 MG/ML IJ SOLN
80.0000 mg | Freq: Two times a day (BID) | INTRAMUSCULAR | Status: DC
  Administered 2017-04-12 – 2017-04-14 (×4): 80 mg via INTRAVENOUS
  Filled 2017-04-12 (×6): qty 8

## 2017-04-12 MED ORDER — ISOSORBIDE MONONITRATE ER 30 MG PO TB24
30.0000 mg | ORAL_TABLET | Freq: Every day | ORAL | Status: DC
  Administered 2017-04-13 – 2017-04-25 (×12): 30 mg via ORAL
  Administered 2017-04-26: 15:00:00 via ORAL
  Administered 2017-04-27: 30 mg via ORAL
  Filled 2017-04-12 (×14): qty 1

## 2017-04-12 MED ORDER — METOPROLOL TARTRATE 25 MG PO TABS
75.0000 mg | ORAL_TABLET | Freq: Two times a day (BID) | ORAL | Status: DC
  Administered 2017-04-12 – 2017-04-14 (×4): 75 mg via ORAL
  Filled 2017-04-12 (×4): qty 1

## 2017-04-12 NOTE — Consult Note (Signed)
Cardiology Consultation Note  Patient ID: Molly Wolf, MRN: [id number], DOB/AGE: 1941-05-13 76 y.o. Admit date: 04/09/2017   Date of Consult: 04/12/2017 Primary Physician: Suann Larry, MD Primary Cardiologist: Dr. Saunders Revel, MD Requesting Physician: Dr. Edwina Barth, MD  Chief Complaint: syncope, hypoxia Reason for Consult: past h/o CAD, CHF, anemia, SOB  HPI: Molly Wolf is a 76 y.o. female who is being seen today for the evaluation of syncope and hypoxia at the request of Dr. Edwina Barth, MD. 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 She presents s/p hypoxic episode, syncope, and unresponsiveness / confusion the same day as her discharge from [facility] (admission 6/29-6/29) d/t CHF exacerbation with active GI bleed and anemia. Of note, GI workup and endoscopy were clear at the time of 6/29 discharge.   The patient reports that she was discharged from the hospital this past Saturday 04/09/17 and lost consciousness when attempting to get into her bed. She does not recall if she hit or head, and states she "did not come to until she was back at the hospital." On exam, she reports chronic and increasingly worse extreme fatigue and difficulty breathing with longstanding orthopnea and PND. She reports she feels SOB at rest and with ambulation, She has a chronic cough that is occasionally productive with "thick, white phlegm that feels like it is choking her." She denies current HA and abdominal pain but reports a chronic decrease in appetite with nausea earlier with eating now resolved. She denies leg pain or swelling, despite right leg appearing larger at the site of past discovered infrapopliteal venous thrombus. She reports stable weight. She reports decreased  urination with bladder scan today 7/2 reading 225m. Patient's last reported BM was today 7/2. During the exam, she often reports that she is "tired of always being sick and wants to go home." Due to the nature of her complaint (syncope, unresponsiveness) and possibly complicated by her Parkinson's, the patient was unable to provide further details but reported that she expected family to arrive soon with more details.  Upon arrival of STheresia Lo(patient's grandson) and CChrys Racer further details were obtained regarding the above syncopal episode. Per her grandson, the patient was "great" at the time of her 04/09/17 discharge from the hospital on 04/09/17. He reported that she had some difficulty walking into the house from the car, which was felt to be the result of ill fitting pants. The patient went to lay down and had no trouble climbing the stairs. Per usual, she requested help with crawling into bed; however, the grandson reports that this time she said it slowly as if her tongue was in the way. The grandson stated that he started to worry and asked the patient if she was OK. She reported that she was starting to slowly "black out" and "needed getting help in the bed," despite the fact that she had already been helped into the bed. The grandson noted the patient was no longer making sense and that her tongue was completely blocking her speech and called for an ambulance. He then noticed the patient drooling and had facial droop.The grandson also stated that, before he was aware of what was happening, his grandmother had lost consciousness. He noticed her blue lips and that she had stopped breathing; therefore, he started to  give her CPR while waiting for the ambulance to arrive. He states that he was able to revive his grandmother before the ambulance arrived to take her to [facility] ED. The patient's grandson reports that, while the patient was awake and with pulse at the time of the paramedics' arrival, she did not  make sense and was not aware of her surroundings. Upon ED arrival, patient's pulse ox was 97% ORA but eventually desaturated to 84%, improved with 2L oxygen via nasal cannula. Patient was then readmitted after being home less than an hour. Grandson also stated he and family will be at the beach but can be reached via cell if needed.   Patient is currently without facial droop, drooling, feelings of near syncope, and blue lips and responds appropriately to questions on exam.   Per Dr. Saunders Revel 03/31/17 office visit notes, patient was in Afib and on DVT prophylaxis with c/o SOB at time of visit. Due to recent episodes of sustained SVT and suboptimal HR during the visit, her metoprolol tartrate was increased to 48m BID and her hydralazine cut in half (273mBID) to ensure she did not develop hypotension in response to the BB increase. She remained on PRN furosemide for if weight gain more than 2lbs in a day or 5lbs in a week. CAD with stable angina remained unchanged from prior visits (lateral T wave changes) but additional EKG testing / ischemic testing was deferred in light of comorbid conditions and CKD IV. Warfarin was continued for DVT and stroke prophylaxis in presence of atrial fibrillation with plan to consider cardioversion in the future and after therapeutic INR for 1 month.   04/04/17 echo showed depressed LVEF at ~35% (difficult to evaluate EF with afib) with severe hypokinesis of mid/distal anterior and anterolateral walls with moderate LVH. In comparison to 01/2017 echo, LVEF is worsening but apical windows were foreshortened making some comparison difficult. AV thick, calcified, mildly restricted with moderated mitral regurg and moderate L/ mild R atrium dilation. PA peak pressure 6537m and small pericardial effusion noted.  01/2008 Echo ([facility]): EF > 55%, mod-sev LVH w/ grade 1 DD, biatrial enlargement, mild AS, trace MR/TR; b. EF 45-50%, GR2DD, mild to moderate aortic stenosis, mild aortic insufficieny,  mild to moderate mitral regurgitation, mild tricuspid regurgitation, mild biatrial enlargement, and a small pericardial effusion. 6/29 CT chest showed cardiomegaly. Ascending aortic aneurysm, 4.8 cm-5.0 with recommendation for semi-annual imaging; aortic and coronary atherosclerosis. Lungs/Pleura: Small to moderate pleural effusions, left slightly larger than right. Posterior left lower lobe patchy airspace opacity could be atelectatic due to the adjacent effusion but cannot exclude a small focus of infectious infiltrate. Central airways are patent and normal in caliber. Mild mosaic attenuation in the upper lobes likely reflects air trapping. Small volume perihepatic ascites. 6/29 CT head was without evidence of acute intracranial abnormality with atrophy, chronic small vessel white matter ischemic changes and remote L caudate infarct.  [facility] BP 143/95, HR 105, temp 97.9, oxygen saturation 100% on nasal cannula, weight 166 pounds. EKG not yet done for this admission [6/29 EKG showed with nonspecific ST-T changes in lateral leads], CXR showed unchanged left lower lobe atelectasis or infiltrate, small b/l effusions, and cardiomegaly. Labs showed RBC 3.45, Hgb 10.0-->9.2, HCT 29.8, K+ 5.6, BUN 47, Scr 2.53-->2.64, glucose 165. She continues to remain hypoxic on room air requiring supplemental oxygen at 2 L via .   Past Medical History:  Diagnosis Date  . Anxiety   .  Arthritis   . Chronic combined systolic and diastolic CHF (congestive heart failure) ([place])    a. 01/2008 Echo ([facility]): EF > 55%, mod-sev LVH w/ grade 1 DD, biatrial enlargement, mild AS, trace MR/TR; b. EF 45-50%, GR2DD, mild to moderate aortic stenosis, mild aortic insufficieny, mild to moderate mitral regurgitation, mild tricuspid regurgitation, mild biatrial enlargement, and a small pericardial effusion  . CKD (chronic kidney disease), stage IV ([place])   . Closed patellar sleeve fracture of  right knee    a. 01/2017 - conservatively managed.  Marland Kitchen COPD (chronic obstructive pulmonary disease) ([place])   . Coronary artery disease    a. s/p remote stenting of unknown vessel @ [facility].  . Depression   . Diabetes mellitus with complication ([place])   . DVT (deep venous thrombosis) (H[facility])    a. in the setting of right patellar fracture on Coumadin  . Hyperlipidemia   . Hypertension   . Parkinson disease ([place])       Most Recent Cardiac Studies: TTE 04/04/17: Study Conclusions  - Left ventricle: LVEF appears depressed at approximately 35% with severe hypokinesis of mid/distal anterior and anterolateral walls. Moderate LVH Atrial fibrillation makes evaluation of LVEF difficult In comparison to echo from April 2018 these findings are more prominent and LVEF is worse.t, But in that study (April 2018) apical windows were foreshortened making some of comparison diffcult - Aortic valve: AV is thickened, calcified with mildly restricted motion. There was mild regurgitation. - Mitral valve: There was moderate regurgitation. - Left atrium: The atrium was moderately dilated. - Right atrium: The atrium was mildly dilated. - Pulmonary arteries: PA peak pressure: 65 mm Hg (S). - Pericardium, extracardiac: A small pericardial effusion was identified.  02/10/17 Long term monitor (14 days) - Predominant NSR with average rate 74 bpm (range 56-103 bpm). Patient triggered event corresponds to sinus rhythm. Occasional PACs; frequent PVCs. Two episodes nonsustained ventricular tachycardia lasting up to 5 beats. 368 episodes of SVT with max rate 182 bpm and longest episode 15 min, 35 sec (average rate 102 bpm). Irregularity during some episodes raises possibility of atrial fibrillation and/or atrial flutter with variable block. There numerous episodes of atrial tachycardia, possibly Afib vs atrial flutter with variable AV block vs sustained SVT. Most suggestive of SVT but atrial flutter  and/or A fib cannot be excluded.  01/26/2017 Echo  -LV wall thickness was increased in pattern of mild LVH with moderate focal basal hypertrophy of septum. Systolic function mildly reduced with EF estimated 45-50%. Akinesis of basal mid inferolateral and inferior myocardium. Features consistent with grade 2 diastolic dysfunction. Aortic valve moderately thickened, mildly calcified leaflets. Cusp separation reduced. Mild to moderate stenosis, mild regurgitation. Mitral valve: mild to moderate regurgitation. Left atrium: mildly dilated. Right ventricle: mildly dilated.   01/26/2017 US venous Img lower bilateral 1. Occlusive thrombus of the right peroneal vein in the calf. No evidence of additional thrombus of the bilateral lower extremities. 2. Left popliteal fossa complex cyst, probably a Baker cyst.  01/2008 Echo ([facility]): EF > 55%, mod-sev LVH w/ grade 1 DD, biatrial enlargement, mild AS, trace MR/TR; b. EF 45-50%, GR2DD, mild to moderate aortic stenosis, mild aortic insufficieny, mild to moderate mitral regurgitation, mild tricuspid regurgitation, mild biatrial enlargement, and a small pericardial effusion.    Surgical History:  Past Surgical History:  Procedure Laterality Date  . BACK SURGERY    . CORONARY ANGIOPLASTY WITH STENT PLACEMENT    . NECK SURGERY       Home Meds: Prior  to Admission medications   Medication Sig Start Date End Date Taking? Authorizing Provider  acetaminophen (TYLENOL) 325 MG tablet Take 650 mg by mouth every 6 (six) hours as needed for mild pain.   Yes [provider]  atorvastatin (LIPITOR) 20 MG tablet Take 20 mg by mouth daily.   Yes [provider]  blood glucose meter kit and supplies KIT Dispense based on patient and insurance preference. Use up to four times daily as directed. (FOR ICD-9 250.00, 250.01). 11/06/16  Yes Alfred Levins, [place], MD  Carbidopa-Levodopa ER (SINEMET CR) 25-100 MG tablet controlled release Take 2 tablets by mouth 4  (four) times daily. 03/05/17  Yes Hackney, Tina A, FNP  Coenzyme Q10 (COQ10) 50 MG CAPS Take 50 mg by mouth daily.   Yes [provider]  CRANBERRY PO Take 1 tablet by mouth daily.   Yes [provider]  escitalopram (LEXAPRO) 20 MG tablet Take 20 mg by mouth daily.   Yes [provider]  furosemide (LASIX) 20 MG tablet Take 1 tablet (20 mg total) by mouth daily. 03/05/17  Yes Hackney, Otila Kluver A, FNP  gabapentin (NEURONTIN) 300 MG capsule Take 300 mg by mouth daily.    Yes [provider]  glucose blood (ACCU-CHEK AVIVA PLUS) test strip Use as instructed 02/25/17  Yes Darylene Price A, FNP  hydrALAZINE (APRESOLINE) 25 MG tablet Take 1 tablet (25 mg total) by mouth 2 (two) times daily. 03/31/17 06/29/17 Yes End, Harrell Gave, MD  insulin glargine (LANTUS) 100 UNIT/ML injection Inject 40 Units into the skin at bedtime.    Yes [provider]  insulin lispro (HUMALOG) 100 UNIT/ML injection Inject 6 Units into the skin 3 (three) times daily with meals.    Yes [provider]  isosorbide mononitrate (IMDUR) 60 MG 24 hr tablet Take 1 tablet (60 mg total) by mouth daily. 03/05/17  Yes Hackney, Otila Kluver A, FNP  loratadine (CLARITIN) 10 MG tablet Take 5 mg by mouth daily as needed for allergies.    Yes [provider]  LORazepam (ATIVAN) 1 MG tablet Take 1 tablet (1 mg total) by mouth 2 (two) times daily as needed for anxiety. 12/29/16  Yes Max Sane, MD  metoprolol tartrate (LOPRESSOR) 50 MG tablet Take 1 tablet (50 mg total) by mouth 2 (two) times daily. 03/31/17 06/29/17 Yes End, Harrell Gave, MD  mirabegron ER (MYRBETRIQ) 25 MG TB24 tablet Take 25 mg by mouth daily.   Yes [provider]  nitroGLYCERIN (NITROSTAT) 0.4 MG SL tablet Place 0.4 mg under the tongue every 5 (five) minutes as needed for chest pain.   Yes [provider]  Omega-3 Fatty Acids (FISH OIL) 1000 MG CAPS Take 1,000 mg by mouth daily.   Yes [provider]    polyethylene glycol (MIRALAX / GLYCOLAX) packet Take 17 g by mouth daily as needed for mild constipation.   Yes [provider]  promethazine (PHENERGAN) 25 MG tablet Take 25 mg by mouth every 6 (six) hours as needed for nausea or vomiting.   Yes [provider]  saccharomyces boulardii (FLORASTOR) 250 MG capsule Take 250 mg by mouth daily.   Yes [provider]  traMADol (ULTRAM) 50 MG tablet Take 1 tablet (50 mg total) by mouth daily as needed. Patient taking differently: Take 50 mg by mouth daily as needed for moderate pain.  12/29/16  Yes Max Sane, MD  warfarin (COUMADIN) 2 MG tablet Take 1 tablet (2 mg total) by mouth as directed. Every Sunday 04/09/17  Yes Fritzi Mandes, MD  warfarin (COUMADIN) 3 MG tablet Take 1 tablet (3 mg total) by mouth daily at 6 PM. Mon,tue,wed,thur,friday, sat 04/09/17  Yes Fritzi Mandes, MD    Inpatient Medications:  . atorvastatin  20 mg Oral Daily  . Carbidopa-Levodopa ER  2 tablet Oral QID  . docusate sodium  100 mg Oral BID  . escitalopram  20 mg Oral Daily  . feeding supplement (ENSURE ENLIVE)  237 mL Oral BID BM  . furosemide  20 mg Oral Daily  . gabapentin  300 mg Oral Daily  . hydrALAZINE  25 mg Oral BID  . insulin aspart  0-9 Units Subcutaneous TID WC  . insulin glargine  24 Units Subcutaneous QHS  . isosorbide mononitrate  60 mg Oral Daily  . metoprolol tartrate  50 mg Oral BID  . mirabegron ER  25 mg Oral Daily  . pramipexole  0.25 mg Oral TID  . saccharomyces boulardii  250 mg Oral Daily  . scopolamine  1 patch Transdermal Q72H  . warfarin  2 mg Oral Once per day on Sun  . warfarin  3 mg Oral Once per day on Mon Tue Wed Thu Fri Sat   . ceFEPime (MAXIPIME) IV 1 g (04/12/17 0609)  . levofloxacin (LEVAQUIN) IV    . sodium chloride    . vancomycin Stopped (04/12/17 0150)    Allergies: No Known Allergies  Social History   Social History  . Marital status: Single    Spouse name: N/A  . Number of children: N/A  .  Years of education: N/A   Occupational History  . Not on file.   Social History Main Topics  . Smoking status: Never Smoker  . Smokeless tobacco: Never Used  . Alcohol use No  . Drug use: No  . Sexual activity: No   Other Topics Concern  . Not on file   Social History Narrative  . No narrative on file     Family History  Problem Relation Age of Onset  . Cervical cancer Mother   . Kidney failure Father   . CAD Father   . CAD Brother   . Prostate cancer Neg Hx   . Kidney cancer Neg Hx   . Bladder Cancer Neg Hx      Review of Systems: Review of Systems  Constitutional: Positive for chills and malaise/fatigue. Negative for fever and weight loss.       Chronic fatigue and weakness  Eyes: Negative for blurred vision.  Respiratory: Positive for cough, sputum production, shortness of breath and wheezing.        Productive cough with white phlegm, consistent with last admission. Chronic SOB and wheezing.  Per family, blue hypoxic lips on 6/29.  Cardiovascular: Positive for orthopnea and PND. Negative for chest pain.  Gastrointestinal: Positive for nausea and vomiting. Negative for abdominal pain, blood in stool and melena.       Earlier nausea / vomiting with eating now resolved.  Previous 6/23 admission with melena.  Genitourinary: Negative for dysuria and hematuria.       Decreased urination  Musculoskeletal: Negative for falls.       No fall but LOC  Skin: Negative for itching.  Neurological: Positive for dizziness, tremors and weakness.       Parkinson's tremor  Speech changes and focal weakness with LOC on 6/29 now resolved. Per family, pt had facial droop, blue lips, and speech irregularity at home 6/29. Syncope/LOC on 6/29.  Psychiatric/Behavioral: The patient  has insomnia.        Chronic sleep issue  All other systems reviewed and are negative.   Labs:  Recent Labs  04/09/17 2221 04/10/17 0333 04/10/17 0917  TROPONINI <0.03 <0.03 <0.03   Lab Results    Component Value Date   WBC 4.9 04/09/2017   HGB 10.0 (L) 04/09/2017   HCT 29.8 (L) 04/09/2017   MCV 86.4 04/09/2017   PLT 187 04/09/2017    Recent Labs Lab 04/09/17 2221 04/11/17 0322  NA 137 137  K 5.6* 4.5  CL 106 109  CO2 22 23  BUN 47* 46*  CREATININE 2.53* 2.59*  CALCIUM 8.9 8.6*  PROT 6.5  --   BILITOT 1.1  --   ALKPHOS 63  --   ALT <5*  --   AST 23  --   GLUCOSE 165* 139*   Lab Results  Component Value Date   CHOL 98 04/04/2017   HDL 37 (L) 04/04/2017   LDLCALC 49 04/04/2017   TRIG 60 04/04/2017   No results found for: DDIMER  Radiology/Studies:  Dg Chest 2 View  Result Date: 04/11/2017 CLINICAL DATA:  Pneumonia EXAM: CHEST  2 VIEW COMPARISON:  04/09/2017 FINDINGS: Cardiomegaly with vascular congestion. Small bilateral pleural effusions. Left lower lobe atelectasis or infiltrate, similar prior study. IMPRESSION: Left lower lobe atelectasis or infiltrate, unchanged. Small bilateral effusions. Cardiomegaly. Electronically Signed   By: Rolm Baptise M.D.   On: 04/11/2017 10:47   Ct Head Wo Contrast  Result Date: 04/09/2017 CLINICAL DATA:  76 year old female with weakness and altered mental status. EXAM: CT HEAD WITHOUT CONTRAST TECHNIQUE: Contiguous axial images were obtained from the base of the skull through the vertex without intravenous contrast. COMPARISON:  12/27/2016 and prior CT FINDINGS: Brain: No evidence of acute infarction, hemorrhage, hydrocephalus, extra-axial collection or mass lesion/mass effect. Atrophy, chronic small-vessel white matter ischemic changes and remote left caudate infarct again noted. Vascular: Intracranial atherosclerotic calcifications noted. Skull: Normal. Negative for fracture or focal lesion. Sinuses/Orbits: No acute finding. Other: None IMPRESSION: No evidence of acute intracranial abnormality. Atrophy, chronic small-vessel white matter ischemic changes and remote left caudate infarct. Electronically Signed   By: Margarette Canada M.D.   On:  04/09/2017 23:38   Ct Chest Wo Contrast  Result Date: 04/10/2017 CLINICAL DATA:  Discharged this evening, now returning with weakness and unresponsiveness. EXAM: CT CHEST WITHOUT CONTRAST TECHNIQUE: Multidetector CT imaging of the chest was performed following the standard protocol without IV contrast. COMPARISON:  04/09/2017 FINDINGS: Cardiovascular: Cardiomegaly. Ascending aortic aneurysm, 4.8 cm-5.0. Arch and descending aorta are normal in caliber. Aortic and coronary atherosclerotic calcifications. Small pericardial effusion. Mediastinum/Nodes: No pathologic adenopathy in the mediastinum or hila. Lungs/Pleura: Small to moderate pleural effusions, left slightly larger than right. Posterior left lower lobe patchy airspace opacity could be atelectatic due to the adjacent effusion but cannot exclude a small focus of infectious infiltrate. Central airways are patent and normal in caliber. Mild mosaic attenuation in the upper lobes likely reflects air trapping. Upper Abdomen: No acute findings in the upper abdomen. Small volume perihepatic ascites. Musculoskeletal: No significant skeletal lesion. IMPRESSION: 1. Ascending thoracic aortic aneurysm. Recommend semi-annual imaging followup by CTA or MRA and referral to cardiothoracic surgery if not already obtained. This recommendation follows 2010 ACCF/AHA/AATS/ACR/ASA/SCA/SCAI/SIR/STS/SVM Guidelines for the Diagnosis and Management of Patients With Thoracic Aortic Disease. Circulation. 2010; 121: B762-G315 2. Aortic and coronary atherosclerosis. 3. Small to moderate pleural effusions bilaterally. 4. Patchy airspace opacity in the posterior left lower lobe, atelectasis  versus infectious infiltrate. Mild air trapping in the upper lobes. 5. Small volume ascites in the upper abdomen. Aortic Atherosclerosis (ICD10-I70.0). Electronically Signed   By: Andreas Newport M.D.   On: 04/10/2017 00:28   Dg Chest Portable 1 View  Result Date: 04/09/2017 CLINICAL DATA:   Weakness unresponsive EXAM: PORTABLE CHEST 1 VIEW COMPARISON:  04/03/2017 FINDINGS: Moderate cardiomegaly with central vascular congestion, increased compared to prior. Left basilar atelectasis or infiltrate. No pneumothorax. Old right-sided rib fractures. IMPRESSION: Cardiomegaly with increased central vascular congestion compared to prior. Increased opacity at the left lung base may reflect atelectasis or an infiltrate Electronically Signed   By: Donavan Foil M.D.   On: 04/09/2017 22:51   Dg Chest Portable 1 View  Result Date: 04/03/2017 CLINICAL DATA:  Cough and shortness of breath EXAM: PORTABLE CHEST 1 VIEW COMPARISON:  Chest radiograph 02/15/2017 FINDINGS: There is massive cardiomegaly. Differences from the prior study may be due to AP technique currently, which exaggerates the heart size. There is no pneumothorax or sizable pleural effusion. There is bibasilar atelectasis. No other consolidation. No pulmonary edema. IMPRESSION: Massive cardiomegaly and bibasilar atelectasis without pulmonary edema. Electronically Signed   By: Ulyses Jarred M.D.   On: 04/03/2017 19:38    EKG: Interpreted by me showed: Afib, 96 bpm nonspecific st/t changes Telemetry: Interpreted by me showed: Afib, HR low 100s to 110s bpm.  Weights: Filed Weights   04/10/17 0500 04/11/17 0445 04/12/17 0545  Weight: 150 lb (68 kg) 155 lb (70.3 kg) 166 lb (75.3 kg)     Physical Exam: Blood pressure (!) 143/95, pulse (!) 105, temperature 97.9 F (36.6 C), temperature source Oral, resp. rate 20, height _0  (1.702 m), weight 166 lb (75.3 kg), SpO2 100 %. Body mass index is 26 kg/m. General: Frail, fatigued, in some distress. Parkinson's tremor noted.  Head: Normocephalic, atraumatic, sclera non-icteric, no xanthomas, nares are without discharge.  Neck: Negative for carotid bruits. JVD elevation present. Lungs: Wheezing bilaterally to auscultation with crackles and rales noted. Breathing is labored and on [place]. Heart:  irregularly irregular with S1 S2. No murmurs, rubs, or gallops appreciated. Abdomen: Soft, non-tender, non-distended with normoactive bowel sounds. No hepatomegaly. No rebound/guarding. No obvious abdominal masses. Msk:  Strength and tone appear normal for age and Parkinson's disease / condition. Extremities: No clubbing or cyanosis. Distal pedal pulses are 2+ and equal bilaterally. Right leg appears slightly larger than left which is consistent with previous admission. Neuro: Alert and oriented X 3. No facial asymmetry. No focal deficit. Moves all extremities spontaneously. No longer disoriented. Psych:  Responds to questions appropriately with a normal affect. Previous disorientation sx resolved.    Assessment and Plan:  Active Problems:   Acute respiratory failure with hypoxia (HCC)  1. Acute respiratory failure with hypoxia. - 6/29 CXR not consistent with acute pulmonary edema (recent admission for CHF exacerbation). There is an area of focal opacity in left lung base as well as right middle lobe that likely represents pneumonia. Previous CT and CXR as above. - Likely exacerbation 2/2 pneumonia and chronic anemia complicated by poorly controlled Afib with RVR. - On nasal cannula with supplemental oxygen as needed. - Continue nebulizer treatment as needed. - Ddimer to r/o PE in setting of DVT with recent sub-therapeutic INR during last admission with recent GI bleed and need for EGD (currently on Warfarin prophylaxis s/p thrombus as above)  - Continue to monitor anemia with CBC.   2. Persistent atrial fibrillation with RVR:  - Rate  remains poorly controlled in the low 100s bpm - Increase Lopressor to 75 mg bid  - Continue warfarin for anticoagulation - D-dimer ordered to r/o PE. Pending results. - CHA2DS2VASc= at least 6 CHF, HTN, AgeX2, DM, vascular disease, female  - Consider cardioversion in future with therapeutic INR (7/2 results 2.55) - Consider TEE/DCCV prior to discharge once  she has been adequately diuresed if she remains symptomatic or if rate remains poorly controlled. If her symptoms improve with diuresis or her rate is better controlled with titration of Lopressor as above and with diuresis she could likely attempt outpatient DCCV in 3 weeks  3. Acute on chronic combined CHF: - She does appear somewhat volume overloaded on exam - No observed pulmonary edema on CXR.  - Increase Lasix to 80 mg bid with KCl repletion - Will need to monitor renal function closely - Lopressor as above (given need of Lopressor for heart rate control we are currently unable to change to Coreg or Toprol, given her cardiomyopathy, at this time) - Continue Imdur/hydralazine (CKD precludes ACEi/ARB/Entresto)  4. Pneumonia:  - Healthcare associated  - IM started the patient on cefepime, vancomycin and levofloxacin. Continue - CXR 7/1 Left lower lobe atelectasis or infiltrate, similar prior study.  Left lower lobe atelectasis or infiltrate, unchanged. Small bilateral effusions. Cardiomegaly.  5. Hx of anemia: - CBC shows RB 3.25, Hbg 9.2, HCT 27.6, platelets 148 - Continue to monitor  - Unlikely to be culprit of her symptoms - Monitor on Coumadin  6. COPD: -  Continue neb treatment per IM   7. Diabetes mellitus type 2:  - Glucose remains elevated with most recent capillary result of 155 - Continue basal insulin adjusted for hospital diet. Sliding-scale insulin as well.  - Per IM  8. CKD: Stage IV: - Avoid nephrotoxic agents. - BUN 45, Scr 2.64 with GFR 16.  - Continue to monitor renal function with labs, especially in the setting of diuresis.  9. DVT prophylaxis:  - 01/26/2017 US venous Img lower bilateral with occlusive thrombus of the right peroneal vein in the calf. No evidence of additional thrombus of the bilateral lower extremities. - Full dose anticoagulation on warfarin as above - Ddimer results pending  10. Hypertension:  - Controlled - Continue hydralazine.  Continue increasing diuretic in setting of volume overload.  11. CAD:  - Stable - Continue Imdur/Lopressor/Lipitor/Coumadin in place of ASA - Given her recent drop in EF as above along with her symptoms, consider ischemic evaluation via nuclear stress testing prior to discharge (will need to prioritize testing pending Ddimer given need of > 48 hours between VQ scan - if needed, and possible Lexiscan)  12. Parkinson's:  - Continue Sinemet, pramipexole and scopolamine - Per IM - Cannot rule out some component of dysautonomia  - If the above workup is completely unrevealing, consider neuro eval    Signed, Marrianne Mood, Student-PA 04/12/2017 9:07 AM  Patient discussed and examined on rounds this morning. Changes made as indicated.   Christell Faith, PA-C [place] Pager: [phone] 04/12/2017, 9:07 AM

## 2017-04-12 NOTE — Progress Notes (Signed)
Subjective: Sensation says she feels weak. Has some mild disorientation early this morning but is back to baseline now.  Objective: Vital signs in last 24 hours: Temp:  [97.7 F (36.5 C)-98.2 F (36.8 C)] 97.9 F (36.6 C) (07/02 0742) Pulse Rate:  [105-118] 105 (07/02 0742) Resp:  [16-20] 20 (07/02 0742) BP: (116-143)/(80-97) 143/95 (07/02 0742) SpO2:  [92 %-100 %] 100 % (07/02 0742) Weight:  [75.3 kg (166 lb)] 75.3 kg (166 lb) (07/02 0545) Weight change: 4.99 kg (11 lb) Last BM Date: 04/10/17  Intake/Output from previous day: 07/01 0701 - 07/02 0700 In: 440 [P.O.:240; IV Piggyback:200] Out: 801 [Urine:800; Emesis/NG output:1] Intake/Output this shift: No intake/output data recorded.  General appearance: no distress Head: Normocephalic, without obvious abnormality, atraumatic Resp: clear to auscultation bilaterally and normal percussion bilaterally Cardio: irregularly irregular rhythm GI: soft, non-tender; bowel sounds normal; no masses,  no organomegaly Neurologic: Grossly normal  Lab Results:  Recent Labs  04/09/17 2221  WBC 4.9  HGB 10.0*  HCT 29.8*  PLT 187   BMET  Recent Labs  04/09/17 2221 04/11/17 0322  NA 137 137  K 5.6* 4.5  CL 106 109  CO2 22 23  GLUCOSE 165* 139*  BUN 47* 46*  CREATININE 2.53* 2.59*  CALCIUM 8.9 8.6*    Studies/Results: Dg Chest 2 View  Result Date: 04/11/2017 CLINICAL DATA:  Pneumonia EXAM: CHEST  2 VIEW COMPARISON:  04/09/2017 FINDINGS: Cardiomegaly with vascular congestion. Small bilateral pleural effusions. Left lower lobe atelectasis or infiltrate, similar prior study. IMPRESSION: Left lower lobe atelectasis or infiltrate, unchanged. Small bilateral effusions. Cardiomegaly. Electronically Signed   By: Charlett NoseKevin  Dover M.D.   On: 04/11/2017 10:47    Medications: I have reviewed the patient's current medications.  Assessment/Plan: 1. Acute Respiratory Failure: Etiology is unclear. Patient will sometimes sat on room air in the  90s at rest. However with any exertion her sats drop significantly. Chest x-ray shows possible infiltrate left lower lobe versus atelectasis. Otherwise no abnormalities. She is Re: Anticoagulated so do not suspect PE. At this point wonder if is related to her atrial fibrillation or some possible chronic CHF. Have asked cardiology to evaluate.  2. COPD: No symptoms of acute exacerbation except for patient's statement that she thought it was acting up before discharge. Continue nebs as scheduled. Do not see any need for steroids at this point 3. ? Pneumonia: PA and lateral follow-up chest x-ray today which shows atelectasis versus infiltrate. We'll continue cefepime for now. Likely stop antibiotics soon.  4. Atrial fibrillation. Patient is tachycardic and low 100s at rest and with any exertion she increases her heart rate. This may be contributing to her hypoxia. Likely need to adjust medications to control her rate. Have asked cardiology to get involved since I follow her. She is on anticoagulation. 5. Hx CHF: No pulmonary edema on CXR. Continue home meds. 6. Hyperkalemia: Resolved  7. Stage 4 CRF: Cr stable.  Total time spent 20 minutes   LOS: 2 days   Gracelyn NurseJohnston,  Zeba Luby D 04/12/2017, 9:06 AM

## 2017-04-12 NOTE — Evaluation (Signed)
Physical Therapy Evaluation Patient Details Name: Molly Wolf MRN: [id number] DOB: 1941/07/28 Today's Date: 04/12/2017   History of Present Illness  Pt is a 76 y.o. female presenting to hospital after recent discharge from hospital (discharge same day) with unresponsiveness; notes report CPR performed.  Pt admitted with acute respiratory failure with hypoxia and testing showing HCA PNA.  Pt's recent admission for acute exacerbation of chronic combined systolic and diastolic heart failure, a-fib with RVR, AKI, and anemia.  PMH includes CHF, CKD, CAD, COPD, DM, DVT, back and neck surgery, and Parkinson's disease.  Clinical Impression  Prior to recent hospital admissions, pt was ambulating with rollator with SBA for safety and also had assist on stairs (also was receiving HHPT prior to recent hospital admissions).  Pt lives with her grandson and grandson's girlfriend in 1 level home.  Currently pt is SBA supine to sit and CGA with transfers.  Pt ambulated 15 feet with RW and declined further ambulation d/t fatigue and SOB.  Pt appearing weaker and easily fatigued compared to last week.  Pt would benefit from skilled PT to address noted impairments and functional limitations (see below for any additional details).  Upon hospital discharge, recommend pt discharge to STR.    Follow Up Recommendations SNF    Equipment Recommendations  Rolling walker with 5" wheels    Recommendations for Other Services       Precautions / Restrictions Precautions Precautions: Fall Restrictions Weight Bearing Restrictions: No      Mobility  Bed Mobility Overal bed mobility: Needs Assistance Bed Mobility: Supine to Sit     Supine to sit: Supervision;HOB elevated     General bed mobility comments: mild increased effort to perform; use of bed rail  Transfers Overall transfer level: Needs assistance Equipment used: Rolling walker (2 wheeled) Transfers: Sit to/from Stand Sit to Stand: Min guard          General transfer comment: mild increased effort to stand but steady  Ambulation/Gait   Ambulation Distance (Feet): 15 Feet Assistive device: Rolling walker (2 wheeled)   Gait velocity: decreased   General Gait Details: mild decreased B step length; mildly shaky but otherwise steady without loss of balance  Stairs            Wheelchair Mobility    Modified Rankin (Stroke Patients Only)       Balance Overall balance assessment: Needs assistance Sitting-balance support: Bilateral upper extremity supported;Feet supported   Sitting balance - Comments: sitting reaching within BOS adjusting socks   Standing balance support: Bilateral upper extremity supported (on RW) Standing balance-Leahy Scale: Fair Standing balance comment: with ambulation                             Pertinent Vitals/Pain Pain Assessment: No/denies pain  Vitals (HR and O2 on 2 L via nasal cannula) stable and WFL throughout treatment session.    Home Living Family/patient expects to be discharged to:: Private residence Living Arrangements: Other (Comment) (grandson and grandson's girlfriend) Available Help at Discharge: Family;Available 24 hours/day Type of Home: House Home Access: Stairs to enter Entrance Stairs-Rails: None Entrance Stairs-Number of Steps: 2-3 Home Layout: One level Home Equipment: Walker - 4 wheels;Wheelchair - manual;Shower seat;Bedside commode;Grab bars - toilet      Prior Function Level of Independence: Needs assistance   Gait / Transfers Assistance Needed: Pt ambulates household distances with 4ww and SBA for safety; uses manual w/c for longer/community distances.  Has  2 assist for stairs.     Comments: Assist from daughter [name] for ADL's/bathing PRN; independent with dressing; reports no recent falls     Hand Dominance        Extremity/Trunk Assessment   Upper Extremity Assessment Upper Extremity Assessment:  (B UE elbow flexion and extension  strength 4/5; AROM shoulder flexion to grossly 100 degrees; fair B grip strength)    Lower Extremity Assessment Lower Extremity Assessment:  (AROM hip flexion, knee flexion/extension, and DF all at least 3/5 strength)    Cervical / Trunk Assessment Cervical / Trunk Assessment: Normal  Communication   Communication: No difficulties  Cognition Arousal/Alertness: Awake/alert Behavior During Therapy: WFL for tasks assessed/performed Overall Cognitive Status: Within Functional Limits for tasks assessed                                        General Comments General comments (skin integrity, edema, etc.): Pt resting in bed upon PT arrival.  Nursing cleared pt for participation in physical therapy.  Pt agreeable to PT session.    Exercises     Assessment/Plan    PT Assessment Patient needs continued PT services  PT Problem List Decreased strength;Decreased activity tolerance;Decreased balance;Decreased mobility       PT Treatment Interventions DME instruction;Gait training;Stair training;Functional mobility training;Therapeutic activities;Therapeutic exercise;Balance training;Patient/family education    PT Goals (Current goals can be found in the Care Plan section)  Acute Rehab PT Goals Patient Stated Goal: to get stronger PT Goal Formulation: With patient Time For Goal Achievement: 04/26/17 Potential to Achieve Goals: Good    Frequency Min 2X/week   Barriers to discharge Decreased caregiver support      Co-evaluation               AM-PAC PT "6 Clicks" Daily Activity  Outcome Measure Difficulty turning over in bed (including adjusting bedclothes, sheets and blankets)?: A Little Difficulty moving from lying on back to sitting on the side of the bed? : A Little Difficulty sitting down on and standing up from a chair with arms (e.g., wheelchair, bedside commode, etc,.)?: Total Help needed moving to and from a bed to chair (including a wheelchair)?: A  Little Help needed walking in hospital room?: A Little Help needed climbing 3-5 steps with a railing? : A Lot 6 Click Score: 15    End of Session Equipment Utilized During Treatment: Gait belt;Oxygen (2 L O2 via nasal cannula) Activity Tolerance: Patient limited by fatigue Patient left: in chair;with call bell/phone within reach;with chair alarm set Nurse Communication: Mobility status;Precautions PT Visit Diagnosis: Muscle weakness (generalized) (M62.81);History of falling (Z91.81);Difficulty in walking, not elsewhere classified (R26.2)    Time: 1005-1030 PT Time Calculation (min) (ACUTE ONLY): 25 min   Charges:   PT Evaluation $PT Eval Low Complexity: 1 Procedure     PT G CodesHendricks Limes:        Denali Becvar, PT 04/12/17, 1:10 PM [phone]

## 2017-04-12 NOTE — Care Management Note (Signed)
Case Management Note  Patient Details  Name: Molly Wolf MRN: [id number] Date of Birth: [birth year]  Subjective/Objective:  Readmit for CHF and PNA. Patient active with [facility] for [facility] and PT. [facility] with [facility] notified of readmission.                  Action/Plan: Will follow progression and continue to assess for home O2.   Expected Discharge Date:  04/12/17               Expected Discharge Plan:  Home w Home Health Services  In-House Referral:     Discharge planning Services  CM Consult  Post Acute Care Choice:  Resumption of Svcs/PTA Provider Choice offered to:     DME Arranged:    DME Agency:     HH Arranged:    HH Agency:     Status of Service:  In process, will continue to follow  If discussed at Long Length of Stay Meetings, dates discussed:    Additional Comments:  Molly MemosLisa M Samad Thon, RN 04/12/2017, 8:24 AM

## 2017-04-13 ENCOUNTER — Ambulatory Visit: Payer: Medicare Other | Admitting: Internal Medicine

## 2017-04-13 ENCOUNTER — Ambulatory Visit: Payer: Medicare Other | Admitting: Family

## 2017-04-13 LAB — GLUCOSE, CAPILLARY
Glucose-Capillary: 86 mg/dL (ref 65–99)
Glucose-Capillary: 150 mg/dL — ABNORMAL HIGH (ref 65–99)
Glucose-Capillary: 191 mg/dL — ABNORMAL HIGH (ref 65–99)
Glucose-Capillary: 151 mg/dL — ABNORMAL HIGH (ref 65–99)

## 2017-04-13 LAB — PROTIME-INR
INR: 2.33
Prothrombin Time: 26 seconds — ABNORMAL HIGH (ref 11.4–15.2)

## 2017-04-13 MED ORDER — LEVOFLOXACIN 500 MG PO TABS
500.0000 mg | ORAL_TABLET | Freq: Every day | ORAL | Status: AC
  Administered 2017-04-14: 500 mg via ORAL
  Filled 2017-04-13: qty 1

## 2017-04-13 MED ORDER — POLYETHYLENE GLYCOL 3350 17 G PO PACK
17.0000 g | PACK | Freq: Every day | ORAL | Status: AC
  Administered 2017-04-13 – 2017-04-15 (×3): 17 g via ORAL
  Filled 2017-04-13 (×3): qty 1

## 2017-04-13 MED ORDER — ORAL CARE MOUTH RINSE
15.0000 mL | Freq: Two times a day (BID) | OROMUCOSAL | Status: DC
  Administered 2017-04-13 – 2017-04-27 (×19): 15 mL via OROMUCOSAL

## 2017-04-13 MED ORDER — CHLORHEXIDINE GLUCONATE 0.12 % MT SOLN
15.0000 mL | Freq: Two times a day (BID) | OROMUCOSAL | Status: DC
  Administered 2017-04-13 – 2017-04-27 (×25): 15 mL via OROMUCOSAL
  Filled 2017-04-13 (×23): qty 15

## 2017-04-13 NOTE — Clinical Social Work Note (Signed)
Clinical Social Work Assessment  Patient Details  Name: Molly CunasJudy Papesh MRN: [id number] Date of Birth: December 14, 1940  Date of referral:  04/13/17               Reason for consult:  Facility Placement                Permission sought to share information with:  Family Supports, Magazine features editoracility Contact Representative Permission granted to share information::  Yes, Verbal Permission Granted  Name::     Freida BusmanLucas,Donna Daughter [phone]  [phone]   Agency::  SNF admissions  Relationship::     Contact Information:     Housing/Transportation Living arrangements for the past 2 months:  Single Family Home Source of Information:  Patient Patient Interpreter Needed:  None Criminal Activity/Legal Involvement Pertinent to Current Situation/Hospitalization:  No - Comment as needed Significant Relationships:  Adult Children, Other Family Members Lives with:  Relatives Do you feel safe going back to the place where you live?  No Need for family participation in patient care:  No (Coment)  Care giving concerns:  Patient feels that she needs some short term rehab before she is able to return back home.   Social Worker assessment / plan:  Patient is a 76 year old female who is alert and oriented x4 and able to express her concerns.  Patient states that she has been to rehab in the past.  Patient states that she can not remember the name of the facility she was at in the past but she said it was not local.  Patient was explained how insurance will pay for stay and what to expect at SNF.  Patient was informed of the different facilities located in the [place].  Patient was explained the role of CSW, and what the process is for looking for a SNF.  Patient requested that CSW contact her daughter to help with SNF decision, CSW attempted to call daughter and left a message awaiting for call back.  Patient gave CSW permission to begin bed search in [place].  Employment status:  Retired Glass blower/designernsurance  information:  Managed Medicare PT Recommendations:  Skilled Nursing Facility Information / Referral to community resources:  Skilled Nursing Facility  Patient/Family's Response to care:  Patient is in agreement to going to SNF for short term rehab.  Patient/Family's Understanding of and Emotional Response to Diagnosis, Current Treatment, and Prognosis:  Patient is hopeful that she will not need SNF for very long.  Emotional Assessment Appearance:  Appears stated age Attitude/Demeanor/Rapport:    Affect (typically observed):  Appropriate, Calm, Pleasant, Stable Orientation:  Oriented to Self, Oriented to Place, Oriented to  Time, Oriented to Situation Alcohol / Substance use:  Not Applicable Psych involvement (Current and /or in the community):  No (Comment)  Discharge Needs  Concerns to be addressed:  Lack of Support Readmission within the last 30 days:  No Current discharge risk:  Lack of support system Barriers to Discharge:  Continued Medical Work up   [place] [employer], Wallis Spizzirri R, LCSWA 04/13/2017, 3:46 PM

## 2017-04-13 NOTE — Progress Notes (Signed)
A&O. Up with one assist. No complaints through the night. On 1.5 L O2. Afib on tele. Rate controlled.

## 2017-04-13 NOTE — Progress Notes (Signed)
Subjective: Patient says she feels a little better today but still very weak. Said she's never been which he doesn't want anything. No chest pain.  Objective: Vital signs in last 24 hours: Temp:  [98 F (36.7 C)-99.1 F (37.3 C)] 98 F (36.7 C) (07/03 0742) Pulse Rate:  [77-117] 101 (07/03 0742) Resp:  [18] 18 (07/03 0742) BP: (111-143)/(72-96) 111/81 (07/03 0742) SpO2:  [95 %-100 %] 95 % (07/03 0742) Weight:  [73 kg (161 lb)] 73 kg (161 lb) (07/03 0438) Weight change: -2.268 kg (-5 lb) Last BM Date: 04/10/17  Intake/Output from previous day: 07/02 0701 - 07/03 0700 In: 120 [P.O.:120] Out: 900 [Urine:900] Intake/Output this shift: No intake/output data recorded.  General appearance: no distress Resp: Very mild bibasilar crackles. No dullness percussion. Not using accessory muscles. Cardio: irregularly irregular rhythm GI: soft, non-tender; bowel sounds normal; no masses,  no organomegaly  Lab Results:  Recent Labs  04/12/17 0959  WBC 5.0  HGB 9.2*  HCT 27.6*  PLT 148*   BMET  Recent Labs  04/11/17 0322 04/12/17 0959  NA 137 138  K 4.5 4.7  CL 109 106  CO2 23 25  GLUCOSE 139* 171*  BUN 46* 45*  CREATININE 2.59* 2.64*  CALCIUM 8.6* 8.7*    Studies/Results: No results found.  Medications: I have reviewed the patient's current medications.  Assessment/Plan: 1. Acute Respiratory Failure: Likely secondary to acute on chronic CHF. Chest x-ray was read as left lower lobe infiltrate. Agree with diuresis. Will 5 wean oxygen as tolerated.  2. COPD:  Exacerbation.  3.  Pneumonia: Chest x-ray is not that impressive however we'll go ahead and finish out antibiotic course. 4. Atrial fibrillation. Metoprolol increased yesterday. Heart rate still in the low 100s. Likely need to increase if she'll tolerate. 5.CHF: Now IV Lasix. Symptoms mildly improved today. 6. Hyperkalemia: Resolved  7. Stage 4 CRF: We'll need to monitor closely while on BMP ordered for in the  morning.  Total time spent 25 minutes.  LOS: 3 days   Molly NurseJohnston,  Molly Wolf 04/13/2017, 9:05 AM

## 2017-04-13 NOTE — Progress Notes (Signed)
Physical Therapy Treatment Patient Details Name: Molly Wolf MRN: [id number] DOB: [birth year] Today's Date: 04/13/2017    History of Present Illness Pt is a 76 y.o. female presenting to hospital after recent discharge from hospital (discharge same day) with unresponsiveness; notes report CPR performed.  Pt admitted with acute respiratory failure with hypoxia and testing showing HCA PNA.  Pt's recent admission for acute exacerbation of chronic combined systolic and diastolic heart failure, a-fib with RVR, AKI, and anemia.  PMH includes CHF, CKD, CAD, COPD, DM, DVT, back and neck surgery, and Parkinson's disease.    PT Comments    Pt initially refusing therapy session stating she does not feel well but does agree to get up to recliner with encouragement from Clinical research associate and primary nurse.  Supervision with bed mobility and min guard for transfer to recliner with walker.  Tremors noted but pt stated they are her baseline.  She declines further mobility and requested nausea medication.  Relayed to primary nurse.  SNF is recommended but pt stated she will return home.  She would benefit from HHPT if she does not go to rehab.    Follow Up Recommendations  SNF     Equipment Recommendations  Rolling walker with 5" wheels    Recommendations for Other Services       Precautions / Restrictions Precautions Precautions: Fall Restrictions Weight Bearing Restrictions: No    Mobility  Bed Mobility Overal bed mobility: Needs Assistance Bed Mobility: Supine to Sit     Supine to sit: Supervision;HOB elevated     General bed mobility comments: mild increased effort to perform; use of bed rail  Transfers Overall transfer level: Needs assistance Equipment used: Rolling walker (2 wheeled) Transfers: Sit to/from Stand Sit to Stand: Min guard            Ambulation/Gait Ambulation/Gait assistance: Min guard Ambulation Distance (Feet): 3 Feet Assistive device: Rolling walker (2 wheeled)    Gait velocity: decreased       Stairs            Wheelchair Mobility    Modified Rankin (Stroke Patients Only)       Balance Overall balance assessment: Needs assistance Sitting-balance support: Bilateral upper extremity supported;Feet supported Sitting balance-Leahy Scale: Good     Standing balance support: Bilateral upper extremity supported Standing balance-Leahy Scale: Fair                              Cognition Arousal/Alertness: Awake/alert Behavior During Therapy: WFL for tasks assessed/performed Overall Cognitive Status: Within Functional Limits for tasks assessed                                        Exercises      General Comments        Pertinent Vitals/Pain Pain Assessment: No/denies pain    Home Living                      Prior Function            PT Goals (current goals can now be found in the care plan section)      Frequency    Min 2X/week      PT Plan Current plan remains appropriate    Co-evaluation              AM-PAC  PT "6 Clicks" Daily Activity  Outcome Measure  Difficulty turning over in bed (including adjusting bedclothes, sheets and blankets)?: A Little Difficulty moving from lying on back to sitting on the side of the bed? : A Little Difficulty sitting down on and standing up from a chair with arms (e.g., wheelchair, bedside commode, etc,.)?: Total Help needed moving to and from a bed to chair (including a wheelchair)?: A Little Help needed walking in hospital room?: A Little Help needed climbing 3-5 steps with a railing? : A Lot 6 Click Score: 15    End of Session Equipment Utilized During Treatment: Gait belt;Oxygen Activity Tolerance: Patient limited by fatigue Patient left: in chair;with chair alarm set;with call bell/phone within reach Nurse Communication: Mobility status       Time:  -     Charges:                       G Codes:       Danielle DessSarah  Tosh Glaze, PTA 04/13/17, 12:27 PM

## 2017-04-13 NOTE — Clinical Social Work Note (Addendum)
CSW spoke to patient regarding SNF placement options.  Patient states she has been to rehab in the past, however it has been awhile.  Patient states that she would like to go to SNF for rehab, and she requested that CSW contact her daughter.  CSW attempted to contact patient's daughter Molly Wolf [phone], and left a message awaiting call back from daughter.  4:30pm  CSW received phone call back from patient's daughter.  [facility].  CSW contacted [facility], and they can accept patient once she is medically ready for discharge and orders have been received.  CSW to continue to follow patient's progress throughout discharge planning.  Molly Wolf, MSW, Theresia MajorsLCSWA [phone]  04/13/2017 3:41 PM

## 2017-04-13 NOTE — Clinical Social Work Placement (Addendum)
   CLINICAL SOCIAL WORK PLACEMENT  NOTE  Date:  04/13/2017  Patient Details  Name: Molly CunasJudy Mahler MRN: [id number] Date of Birth: [birth year]  Clinical Social Work is seeking post-discharge placement for this patient at the Skilled  Nursing Facility level of care (*CSW will initial, date and re-position this form in  chart as items are completed):  Yes   Patient/family provided with [facility] Clinical Social Work Department's list of facilities offering this level of care within the geographic area requested by the patient (or if unable, by the patient's family).  Yes   Patient/family informed of their freedom to choose among providers that offer the needed level of care, that participate in Medicare, Medicaid or managed care program needed by the patient, have an available bed and are willing to accept the patient.  Yes   Patient/family informed of [facility]'s ownership interest in [facility] and [facility], as well as of the fact that they are under no obligation to receive care at these facilities.  PASRR submitted to EDS on 04/13/17     PASRR number received on       Existing PASRR number confirmed on 04/13/17     FL2 transmitted to all facilities in geographic area requested by pt/family on 04/13/17     FL2 transmitted to all facilities within larger geographic area on       Patient informed that his/her managed care company has contracts with or will negotiate with certain facilities, including the following:         04-16-17   Patient/family informed of bed offers received. (Updated Windell MouldingEric Tianah Lonardo, MSW, [place]CSWA, 04-27-17)  Patient chooses bed at  [facility] (Updated Windell MouldingEric Ikenna Ohms, MSW, [place]CSWA, 04-27-17)    Physician recommends and patient chooses bed at      Patient to be transferred to  [facility]  on  04-27-17 (Updated Windell MouldingEric Onnika Siebel, MSW, [place]CSWA, 04-27-17).  Patient to be transferred to facility by  [facility] (Updated Windell MouldingEric Lateia Fraser, MSW, [place]CSWA,  04-27-17)     Patient family notified on  04-27-17 of transfer (Updated Windell MouldingEric Geisha Abernathy, MSW, [place] Columbia[place]CSWA, 04-27-17).  Name of family member notified:    Freida BusmanDonna Lucas [phone] (Updated Windell MouldingEric Buffy Ehler, MSW, [place]taLCSWA, 04-27-17)    PHYSICIAN Please sign FL2     Additional Comment:    _______________________________________________ Darleene CleaverAnterhaus, Zaidan Keeble R, LCSWA 04/13/2017, 3:56 PM   Ervin KnackEric R. Hassan Rowannterhaus, MSW, Theresia MajorsLCSWA [phone]  04/27/2017 3:44 PM  (Updated Windell MouldingEric Allisyn Kunz, MSW, [place]CSWA, 04-27-17)

## 2017-04-13 NOTE — NC FL2 (Signed)
[place] MEDICAID FL2 LEVEL OF CARE SCREENING TOOL     IDENTIFICATION  Patient Name: Molly Wolf Birthdate: Apr 09, 1941 Sex: female Admission Date (Current Location): 04/09/2017  [place]   Facility and Address:  [facility], [address], [place], [place] [address]      Provider Number: [id number]  Attending Physician Name and Address:  Gracelyn NurseJohnston, John D, MD  Relative Name and Phone Number:  Freida BusmanLucas,Donna Daughter [phone]  [phone]     Current Level of Care: Hospital Recommended Level of Care: Skilled Nursing Facility Prior Approval Number:    Date Approved/Denied:   PASRR Number: [id number]  Discharge Plan: SNF    Current Diagnoses: Patient Active Problem List   Diagnosis Date Noted  . Acute respiratory failure with hypoxia (HCC) 04/10/2017  . Acute combined systolic (congestive) and diastolic (congestive) heart failure (HCC) 04/03/2017  . Coronary artery disease of native artery of native heart with stable angina pectoris (HCC) 03/31/2017  . Paroxysmal atrial fibrillation (HCC) 03/31/2017  . Chronic diastolic heart failure (HCC) 02/26/2017  . HTN (hypertension) 02/26/2017  . Parkinson disease (HCC) 02/26/2017  . Intractable pain 12/27/2016    Orientation RESPIRATION BLADDER Height & Weight     Self, Time, Situation, Place  O2 Continent Weight: 161 lb (73 kg) Height:  5\' 7"  (170.2 cm)  BEHAVIORAL SYMPTOMS/MOOD NEUROLOGICAL BOWEL NUTRITION STATUS      Continent Diet Cardiac  AMBULATORY STATUS COMMUNICATION OF NEEDS Skin   Limited Assist Verbally Normal                       Personal Care Assistance Level of Assistance  Bathing, Feeding, Dressing Bathing Assistance: Limited assistance Feeding assistance: Independent Dressing Assistance: Limited assistance     Functional Limitations Info  Sight, Hearing Sight Info: Adequate Hearing Info: Adequate      SPECIAL CARE FACTORS FREQUENCY   PT (By licensed PT)     PT Frequency: 5x a week              Contractures Contractures Info: Not present    Additional Factors Info  Code Status, Allergies, Psychotropic, Insulin Sliding Scale Code Status Info: Full Code Allergies Info: NKA Psychotropic Info: escitalopram (LEXAPRO) tablet 20 mg Insulin Sliding Scale Info: insulin aspart (novoLOG) injection 0-9 Units       Current Medications (04/13/2017):  This is the current hospital active medication list Current Facility-Administered Medications  Medication Dose Route Frequency Provider Last Rate Last Dose  . acetaminophen (TYLENOL) tablet 650 mg  650 mg Oral Q6H PRN Arnaldo Nataliamond, Michael S, MD       Or  . acetaminophen (TYLENOL) suppository 650 mg  650 mg Rectal Q6H PRN Arnaldo Nataliamond, Michael S, MD      . atorvastatin (LIPITOR) tablet 20 mg  20 mg Oral Daily Arnaldo Nataliamond, Michael S, MD   20 mg at 04/13/17 0910  . Carbidopa-Levodopa ER (SINEMET CR) 25-100 MG tablet controlled release 2 tablet  2 tablet Oral QID Arnaldo Nataliamond, Michael S, MD   2 tablet at 04/13/17 1348  . chlorhexidine (PERIDEX) 0.12 % solution 15 mL  15 mL Mouth Rinse BID Gracelyn NurseJohnston, John D, MD   15 mL at 04/13/17 0911  . docusate sodium (COLACE) capsule 100 mg  100 mg Oral BID Arnaldo Nataliamond, Michael S, MD   100 mg at 04/13/17 0910  . escitalopram (LEXAPRO) tablet 20 mg  20 mg Oral Daily Arnaldo Nataliamond, Michael S, MD   20 mg at 04/13/17 0914  .  feeding supplement (ENSURE ENLIVE) (ENSURE ENLIVE) liquid 237 mL  237 mL Oral BID BM Gracelyn Nurse, MD   237 mL at 04/13/17 1350  . furosemide (LASIX) injection 80 mg  80 mg Intravenous BID Eula Listen M, PA-C   80 mg at 04/13/17 0818  . gabapentin (NEURONTIN) capsule 300 mg  300 mg Oral Daily Arnaldo Natal, MD   300 mg at 04/13/17 6962  . hydrALAZINE (APRESOLINE) tablet 25 mg  25 mg Oral BID Arnaldo Natal, MD   25 mg at 04/13/17 0910  . insulin aspart (novoLOG) injection 0-9 Units  0-9 Units Subcutaneous TID WC Arnaldo Natal, MD   1 Units  at 04/13/17 1203  . insulin glargine (LANTUS) injection 24 Units  24 Units Subcutaneous QHS Arnaldo Natal, MD   24 Units at 04/11/17 2118  . isosorbide mononitrate (IMDUR) 24 hr tablet 30 mg  30 mg Oral Daily Eula Listen M, PA-C   30 mg at 04/13/17 0915  . [START ON 04/14/2017] levofloxacin (LEVAQUIN) tablet 500 mg  500 mg Oral Daily Gracelyn Nurse, MD      . loratadine (CLARITIN) tablet 5 mg  5 mg Oral Daily PRN Arnaldo Natal, MD      . LORazepam (ATIVAN) tablet 1 mg  1 mg Oral BID PRN Arnaldo Natal, MD   1 mg at 04/10/17 2340  . MEDLINE mouth rinse  15 mL Mouth Rinse q12n4p Gracelyn Nurse, MD   15 mL at 04/13/17 1203  . metoprolol tartrate (LOPRESSOR) tablet 75 mg  75 mg Oral BID Eula Listen M, PA-C   75 mg at 04/13/17 9528  . mirabegron ER (MYRBETRIQ) tablet 25 mg  25 mg Oral Daily Arnaldo Natal, MD   25 mg at 04/13/17 0914  . nitroGLYCERIN (NITROSTAT) SL tablet 0.4 mg  0.4 mg Sublingual Q5 min PRN Arnaldo Natal, MD      . ondansetron [facility]) tablet 4 mg  4 mg Oral Q6H PRN Arnaldo Natal, MD       Or  . ondansetron [facility]) injection 4 mg  4 mg Intravenous Q6H PRN Arnaldo Natal, MD   4 mg at 04/13/17 1149  . polyethylene glycol (MIRALAX / GLYCOLAX) packet 17 g  17 g Oral Daily PRN Arnaldo Natal, MD      . polyethylene glycol (MIRALAX / GLYCOLAX) packet 17 g  17 g Oral Daily Gracelyn Nurse, MD   17 g at 04/13/17 1202  . pramipexole (MIRAPEX) tablet 0.25 mg  0.25 mg Oral TID Arnaldo Natal, MD   0.25 mg at 04/13/17 0912  . saccharomyces boulardii (FLORASTOR) capsule 250 mg  250 mg Oral Daily Arnaldo Natal, MD   250 mg at 04/13/17 0914  . scopolamine (TRANSDERM-SCOP) 1 MG/3DAYS 1.5 mg  1 patch Transdermal Q72H Arnaldo Natal, MD   1.5 mg at 04/13/17 0503  . sodium chloride 0.9 % bolus 500 mL  500 mL Intravenous Once Arnaldo Natal, MD      . traMADol Janean Sark) tablet 50 mg  50 mg Oral Daily PRN Arnaldo Natal, MD      . warfarin  (COUMADIN) tablet 2 mg  2 mg Oral Once per day on Sun Arnaldo Natal, MD   2 mg at 04/11/17 1732  . warfarin (COUMADIN) tablet 3 mg  3 mg Oral Once per day on Mon Tue Wed Thu Fri Sat Arnaldo Natal, MD   3  mg at 04/12/17 1743     Discharge Medications: Please see discharge summary for a list of discharge medications.  Relevant Imaging Results:  Relevant Lab Results:   Additional Information SSN [id number]  Darleene Cleaver, [place]

## 2017-04-13 NOTE — Progress Notes (Signed)
Remains very weak today,said she is not feeling good but better than yesterday,PT in progress,iv lasix continues for diuresis,tolerating diet,unable to wean oxygen.

## 2017-04-14 DIAGNOSIS — I4891 Unspecified atrial fibrillation: Secondary | ICD-10-CM

## 2017-04-14 DIAGNOSIS — R0603 Acute respiratory distress: Secondary | ICD-10-CM

## 2017-04-14 DIAGNOSIS — N179 Acute kidney failure, unspecified: Secondary | ICD-10-CM

## 2017-04-14 DIAGNOSIS — I255 Ischemic cardiomyopathy: Secondary | ICD-10-CM

## 2017-04-14 LAB — BASIC METABOLIC PANEL
Anion gap: 8 (ref 5–15)
BUN: 47 mg/dL — AB (ref 6–20)
CO2: 27 mmol/L (ref 22–32)
Calcium: 9 mg/dL (ref 8.9–10.3)
Chloride: 104 mmol/L (ref 101–111)
Creatinine, Ser: 2.71 mg/dL — ABNORMAL HIGH (ref 0.44–1.00)
GFR calc Af Amer: 18 mL/min — ABNORMAL LOW (ref >60)
GFR, EST NON AFRICAN AMERICAN: 16 mL/min — AB (ref >60)
GLUCOSE: 181 mg/dL — AB (ref 65–99)
POTASSIUM: 4.5 mmol/L (ref 3.5–5.1)
Sodium: 139 mmol/L (ref 135–145)

## 2017-04-14 LAB — GLUCOSE, CAPILLARY
Glucose-Capillary: 159 mg/dL — ABNORMAL HIGH (ref 65–99)
Glucose-Capillary: 128 mg/dL — ABNORMAL HIGH (ref 65–99)
Glucose-Capillary: 155 mg/dL — ABNORMAL HIGH (ref 65–99)
Glucose-Capillary: 172 mg/dL — ABNORMAL HIGH (ref 65–99)

## 2017-04-14 LAB — CBC
HCT: 27.9 % — ABNORMAL LOW (ref 35.0–47.0)
Hemoglobin: 9.3 g/dL — ABNORMAL LOW (ref 12.0–16.0)
MCH: 28.4 pg (ref 26.0–34.0)
MCHC: 33.2 g/dL (ref 32.0–36.0)
MCV: 85.7 fL (ref 80.0–100.0)
PLATELETS: 142 10*3/uL — AB (ref 150–440)
RBC: 3.25 MIL/uL — AB (ref 3.80–5.20)
RDW: 15.6 % — AB (ref 11.5–14.5)
WBC: 5 10*3/uL (ref 3.6–11.0)

## 2017-04-14 LAB — PROTIME-INR
INR: 2.67
PROTHROMBIN TIME: 29 s — AB (ref 11.4–15.2)

## 2017-04-14 LAB — PROCALCITONIN: Procalcitonin: <0.1 ng/mL

## 2017-04-14 MED ORDER — METOPROLOL TARTRATE 50 MG PO TABS
100.0000 mg | ORAL_TABLET | Freq: Two times a day (BID) | ORAL | Status: DC
  Administered 2017-04-14 – 2017-04-27 (×24): 100 mg via ORAL
  Filled 2017-04-14 (×25): qty 2

## 2017-04-14 MED ORDER — WARFARIN - PHYSICIAN DOSING INPATIENT
Freq: Every day | Status: DC
  Administered 2017-04-15: 1

## 2017-04-14 MED ORDER — FUROSEMIDE 10 MG/ML IJ SOLN
40.0000 mg | Freq: Two times a day (BID) | INTRAMUSCULAR | Status: DC
Start: 2017-04-14 — End: 2017-04-15
  Administered 2017-04-14: 40 mg via INTRAVENOUS
  Filled 2017-04-14: qty 4

## 2017-04-14 NOTE — Progress Notes (Signed)
Patient Name: Molly Wolf Date of Encounter: 04/14/2017  Primary Cardiologist: End  Hospital Problem List     Active Problems:   Acute respiratory failure with hypoxia (HCC)     Subjective   Lethargic, family felt she was confused this morning Family at the bedside Laying in bed, reports having left-sided chest pain, worse on palpation Sat up in the recliner chair yesterday for several hours Needing assistance for ambulation Still with cough  Inpatient Medications    Scheduled Meds: . atorvastatin  20 mg Oral Daily  . Carbidopa-Levodopa ER  2 tablet Oral QID  . chlorhexidine  15 mL Mouth Rinse BID  . docusate sodium  100 mg Oral BID  . escitalopram  20 mg Oral Daily  . feeding supplement (ENSURE ENLIVE)  237 mL Oral BID BM  . furosemide  40 mg Intravenous BID  . gabapentin  300 mg Oral Daily  . hydrALAZINE  25 mg Oral BID  . insulin aspart  0-9 Units Subcutaneous TID WC  . insulin glargine  24 Units Subcutaneous QHS  . isosorbide mononitrate  30 mg Oral Daily  . mouth rinse  15 mL Mouth Rinse q12n4p  . metoprolol tartrate  100 mg Oral BID  . mirabegron ER  25 mg Oral Daily  . polyethylene glycol  17 g Oral Daily  . pramipexole  0.25 mg Oral TID  . saccharomyces boulardii  250 mg Oral Daily  . scopolamine  1 patch Transdermal Q72H  . warfarin  2 mg Oral Once per day on Sun  . warfarin  3 mg Oral Once per day on Mon Tue Wed Thu Fri Sat  . Warfarin - Physician Dosing Inpatient   Does not apply q1800   Continuous Infusions: . sodium chloride     PRN Meds: acetaminophen **OR** acetaminophen, loratadine, LORazepam, nitroGLYCERIN, ondansetron **OR** ondansetron (ZOFRAN) IV, polyethylene glycol, traMADol   Vital Signs    Vitals:   04/14/17 0225 04/14/17 0401 04/14/17 0818 04/14/17 1200  BP:  (!) 134/99 (!) 139/94 120/82  Pulse:  97 98 (!) 49  Resp:  18 16 18   Temp:  98.3 F (36.8 C) 98.5 F (36.9 C) 97.9 F (36.6 C)  TempSrc:  Oral Oral Oral  SpO2:  98%  100% 99%  Weight: 166 lb 1.6 oz (75.3 kg)     Height:        Intake/Output Summary (Last 24 hours) at 04/14/17 1629 Last data filed at 04/14/17 1530  Gross per 24 hour  Intake              360 ml  Output             1025 ml  Net             -665 ml   Filed Weights   04/12/17 0545 04/13/17 0438 04/14/17 0225  Weight: 166 lb (75.3 kg) 161 lb (73 kg) 166 lb 1.6 oz (75.3 kg)    Physical Exam    GEN: Frail appearing, in no acute distress, Laying in bed HEENT: Grossly normal.  Neck: Supple, no JVD, carotid bruits, or masses. Cardiac: Irregularly irregular, II/VI systolic murmur, no rubs, or gallops. No clubbing, cyanosis, edema.  Radials/DP/PT 2+ and equal bilaterally.  Respiratory:  Respirations regular and unlabored, scattered rales, worse at the bases, poor inspiratory effort GI: Soft, nontender, nondistended, BS + x 4. MS: no deformity, mild muscle atrophy lower extremities Skin: warm and dry, no rash. Neuro:  Strength and sensation  are intact. Tremor.  Psych: AAOx3.  Normal affect.  Labs    CBC  Recent Labs  04/12/17 0959 04/14/17 0456  WBC 5.0 5.0  HGB 9.2* 9.3*  HCT 27.6* 27.9*  MCV 84.7 85.7  PLT 148* 142*   Basic Metabolic Panel  Recent Labs  04/12/17 0959 04/14/17 0456  NA 138 139  K 4.7 4.5  CL 106 104  CO2 25 27  GLUCOSE 171* 181*  BUN 45* 47*  CREATININE 2.64* 2.71*  CALCIUM 8.7* 9.0   Liver Function Tests No results for input(s): AST, ALT, ALKPHOS, BILITOT, PROT, ALBUMIN in the last 72 hours. No results for input(s): LIPASE, AMYLASE in the last 72 hours. Cardiac Enzymes No results for input(s): CKTOTAL, CKMB, CKMBINDEX, TROPONINI in the last 72 hours. BNP Invalid input(s): POCBNP D-Dimer No results for input(s): DDIMER in the last 72 hours. Hemoglobin A1C No results for input(s): HGBA1C in the last 72 hours. Fasting Lipid Panel No results for input(s): CHOL, HDL, LDLCALC, TRIG, CHOLHDL, LDLDIRECT in the last 72 hours. Thyroid Function  Tests No results for input(s): TSH, T4TOTAL, T3FREE, THYROIDAB in the last 72 hours.  Invalid input(s): FREET3  Telemetry    Afib 90 to 100s bpm - Personally Reviewed  ECG    n/a - Personally Reviewed  Radiology    No results found.  Cardiac Studies   TTE 04/04/17: Study Conclusions  - Left ventricle: LVEF appears depressed at approximately 35% with severe hypokinesis of mid/distal anterior and anterolateral walls.Moderate LVH  - Aortic valve: AV is thickened, calcified with mildly restricted motion. There was mild regurgitation. - Mitral valve: There was moderate regurgitation. - Left atrium: The atrium was moderately dilated. - Right atrium: The atrium was mildly dilated. - Pulmonary arteries: PA peak pressure: 65 mm Hg (S).   Patient Profile     76 y.o. female with history of CHF,[facility] COPD, CKD stage IV, Parkinson's, HL, HTN, DVT on coumadin, & atrial fibrillation who [facility] in afib with RVR for evaluation of generalized weakness, SOB, orthopnea, black stools, and dizziness X2 days and seen by Cardiology at the request of Dr. Tonye RoyaltyAlexis Hugelmeyer for heart failure.  Assessment & Plan    1. Acute on chronic systolic CHF: Ejection fraction 35% on echocardiogram 04/04/2017 with wall motion abnormality -Not on ACE/ARB/Entresto/spiro given CKD stage IV On Lasix 40 IV twice a day  chronic anemia  likely contributing to shortness of breath symptoms Monitoring basic metabolic panel daily  2. Persistent Afib: On metoprolol 100 twice a day -Coumadin on hold given likely GI bleed -GI planning for EGD when able -CHADS2VASc at least 6 (CHF, HTN, age x 2, DM, female) Not a good candidate for digoxin Amiodarone could be used will rate control if needed Family does not seem to be interested in cardioversion  3. Acute on CKD stage IV: Stable creatinine  4. Acute on chronic anemia of chronic disease: -Maintain HGB >  8.5 -Coumadin on hold  5. Elevated troponin/CAD: -No symptoms concerning for ACS -Likely supply demand ischemia -No plans for inpatient ischemic eval  Long discussion with family at the bedside concerning her cardiac issues Long discussion concerning rehabilitation High risk of readmission given her frail state  Total encounter time more than 35 minutes  Greater than 50% was spent in counseling and coordination of care with the patient   Signed, Signed, Dossie Arbourim Gollan, MD, Ph.D [facility] 04/14/2017, 4:29 PM

## 2017-04-14 NOTE — Progress Notes (Signed)
Subjective: Patient says she feels a little better. Still having significant weakness.  Objective: Vital signs in last 24 hours: Temp:  [98 F (36.7 C)-98.5 F (36.9 C)] 98.5 F (36.9 C) (07/04 0818) Pulse Rate:  [97-118] 98 (07/04 0818) Resp:  [16-18] 16 (07/04 0818) BP: (95-139)/(67-99) 139/94 (07/04 0818) SpO2:  [98 %-100 %] 100 % (07/04 0818) Weight:  [75.3 kg (166 lb 1.6 oz)] 75.3 kg (166 lb 1.6 oz) (07/04 0225) Weight change: 2.313 kg (5 lb 1.6 oz) Last BM Date: 04/10/17  Intake/Output from previous day: 07/03 0701 - 07/04 0700 In: -  Out: 1175 [Urine:1175] Intake/Output this shift: No intake/output data recorded.  General appearance: no distress Resp: clear to auscultation bilaterally and normal percussion bilaterally Cardio: irregularly irregular rhythm GI: soft, non-tender; bowel sounds normal; no masses,  no organomegaly  Lab Results:  Recent Labs  04/12/17 0959 04/14/17 0456  WBC 5.0 5.0  HGB 9.2* 9.3*  HCT 27.6* 27.9*  PLT 148* 142*   BMET  Recent Labs  04/12/17 0959 04/14/17 0456  NA 138 139  K 4.7 4.5  CL 106 104  CO2 25 27  GLUCOSE 171* 181*  BUN 45* 47*  CREATININE 2.64* 2.71*  CALCIUM 8.7* 9.0    Studies/Results: No results found.  Medications: I have reviewed the patient's current medications.  Assessment/Plan: 1. Acute Respiratory Failure: Likely secondary to acute on chronic CHF and pneumonia. Patient is decreasing. Currently oxygen as tolerated.  2. COPD:  Exacerbation. 3.  Pneumonia: Chest x-ray is not that impressive however we'll go ahead and finish out antibiotic course. 4.Atrial fibrillation. Still tachycardiac. Rate needs better control. We'll increase metoprolol 100 mg twice a day today.  5.CHF: Now IV Lasix. Symptoms are improving. Creatinine is bumped up today so will decrease Lasix dose. 6. Hyperkalemia: Resolved  7. Stage 4 CRF: Creatinine little today secondary to diuresis. Decreased excellent dose of Lasix and  monitor creatinine.  Total time spent 25 minutes  LOS: 4 days   Gracelyn NurseJohnston,  John D 04/14/2017, 8:51 AM

## 2017-04-14 NOTE — Progress Notes (Signed)
She unable to eat her dinner due to nausea.  Zofran 4 mg given slow IVP

## 2017-04-15 ENCOUNTER — Inpatient Hospital Stay: Payer: Medicare Other

## 2017-04-15 ENCOUNTER — Ambulatory Visit: Payer: Medicare Other | Admitting: Family

## 2017-04-15 DIAGNOSIS — R410 Disorientation, unspecified: Secondary | ICD-10-CM

## 2017-04-15 DIAGNOSIS — J9601 Acute respiratory failure with hypoxia: Secondary | ICD-10-CM

## 2017-04-15 LAB — BLOOD GAS, ARTERIAL
ACID-BASE EXCESS: 4 mmol/L — AB (ref 0.0–2.0)
BICARBONATE: 29.2 mmol/L — AB (ref 20.0–28.0)
FIO2: 0.28
O2 Saturation: 97.6 %
PH ART: 7.41 (ref 7.350–7.450)
PO2 ART: 97 mmHg (ref 83.0–108.0)
Patient temperature: 37
pCO2 arterial: 46 mmHg (ref 32.0–48.0)

## 2017-04-15 LAB — BASIC METABOLIC PANEL
ANION GAP: 7 (ref 5–15)
BUN: 50 mg/dL — ABNORMAL HIGH (ref 6–20)
CALCIUM: 9 mg/dL (ref 8.9–10.3)
CO2: 28 mmol/L (ref 22–32)
CREATININE: 2.8 mg/dL — AB (ref 0.44–1.00)
Chloride: 104 mmol/L (ref 101–111)
GFR calc Af Amer: 18 mL/min — ABNORMAL LOW (ref >60)
GFR calc non Af Amer: 15 mL/min — ABNORMAL LOW (ref >60)
GLUCOSE: 205 mg/dL — AB (ref 65–99)
Potassium: 4.8 mmol/L (ref 3.5–5.1)
Sodium: 139 mmol/L (ref 135–145)

## 2017-04-15 LAB — IRON AND TIBC
Iron: 35 ug/dL (ref 28–170)
Saturation Ratios: 10 % — ABNORMAL LOW (ref 10.4–31.8)
TIBC: 343 ug/dL (ref 250–450)
UIBC: 308 ug/dL

## 2017-04-15 LAB — CULTURE, BLOOD (ROUTINE X 2)
Culture: NO GROWTH
Culture: NO GROWTH
Special Requests: ADEQUATE

## 2017-04-15 LAB — FERRITIN: FERRITIN: 31 ng/mL (ref 11–307)

## 2017-04-15 LAB — GLUCOSE, CAPILLARY
Glucose-Capillary: 140 mg/dL — ABNORMAL HIGH (ref 65–99)
Glucose-Capillary: 160 mg/dL — ABNORMAL HIGH (ref 65–99)

## 2017-04-15 LAB — PROTIME-INR
INR: 2.59
PROTHROMBIN TIME: 28.3 s — AB (ref 11.4–15.2)

## 2017-04-15 MED ORDER — AMIODARONE HCL 200 MG PO TABS
400.0000 mg | ORAL_TABLET | Freq: Two times a day (BID) | ORAL | Status: DC
  Administered 2017-04-15 – 2017-04-19 (×9): 400 mg via ORAL
  Filled 2017-04-15 (×9): qty 2

## 2017-04-15 NOTE — Progress Notes (Signed)
End of shift note: Patient found to be confused throughout the morning.  Patient was fatigue and lethargic during the shift limiting ability to participate in care and to take medications. Patient became more arousable during the 1700 hour. Mirapex discontinued as this may be a cause to patient being confused. Head CT and ABG neg. On 2L O2 nasal cannula. Family updated with the plan of care.

## 2017-04-15 NOTE — Progress Notes (Signed)
PT Cancellation Note  Patient Details Name: Molly Wolf MRN: [id number] DOB: 10/26/1940   Cancelled Treatment:    Reason Eval/Treat Not Completed: Fatigue/lethargy limiting ability to participate. Treatment attempted; many family/visitors in the room. Pt is obtunded and per chart review and discussion with family, pt has not responded to anyone today. Pt unable to participate in PT; re attempt at a later date as pt status allows.    Scot DockHeidi E Barnes, PTA 04/15/2017, 3:42 PM

## 2017-04-15 NOTE — Care Management (Addendum)
Barrier- unable to wean oxygen. Continued need for diuresis but having to hold lasix due to worsening renal function. Continues with issues with increased heart rate. New onset of confusion. Spoke with primary nurse regarding the urinalysis that was ordered on admission.

## 2017-04-15 NOTE — Progress Notes (Signed)
Patient ID: Molly Wolf, female   DOB: 12-Jul-1941, 76 y.o.   MRN: [id number]   Returned to see patient and met with oldest daughter. Patient somulant now. Daughter says see gets confused at times and has sundowned in the past when she was taking care of her. Also tells me that her doctor stopped some of her Parkinson's meds because he thought it may be affecting her heart.  Patient had CT head and ABG which were both normal. I reviewed medications. She is on Marepex which can have cardiac side effects as well as somulance and hallucinations. Will d/c Marapex. Consider neurology consult if no improvement.

## 2017-04-15 NOTE — Progress Notes (Signed)
Subjective: Patient appears more confused today. No specific complaint. Still has difficulty getting up with PT because of shortness of breath and tachycardia  Objective: Vital signs in last 24 hours: Temp:  [97.9 F (36.6 C)-98.1 F (36.7 C)] 98 F (36.7 C) (07/05 0423) Pulse Rate:  [49-127] 97 (07/05 0423) Resp:  [16-18] 16 (07/04 1955) BP: (120-140)/(82-99) 140/99 (07/05 0423) SpO2:  [99 %-100 %] 99 % (07/05 0423) Weight:  [74.4 kg (164 lb)] 74.4 kg (164 lb) (07/05 0423) Weight change: -0.953 kg (-2 lb 1.6 oz) Last BM Date: 04/10/17  Intake/Output from previous day: 07/04 0701 - 07/05 0700 In: 360 [P.O.:360] Out: 700 [Urine:700] Intake/Output this shift: No intake/output data recorded.  General appearance: alert and Somewhat confused Resp: Very mild bibasilar crackles. Otherwise clear to auscultation Cardio: irregularly irregular rhythm GI: soft, non-tender; bowel sounds normal; no masses,  no organomegaly Neurologic: Mental status: Alert, oriented, thought content appropriate, Mildly confused. Attention tremors.  Lab Results:  Recent Labs  04/12/17 0959 04/14/17 0456  WBC 5.0 5.0  HGB 9.2* 9.3*  HCT 27.6* 27.9*  PLT 148* 142*   BMET  Recent Labs  04/14/17 0456 04/15/17 0446  NA 139 139  K 4.5 4.8  CL 104 104  CO2 27 28  GLUCOSE 181* 205*  BUN 47* 50*  CREATININE 2.71* 2.80*  CALCIUM 9.0 9.0    Studies/Results: No results found.  Medications: I have reviewed the patient's current medications.  Assessment/Plan: 1. Acute Respiratory Failure:Likely secondary to acute on chronic CHF and pneumonia. patient's been diuresed. Still have not been out to wean the oxygen yet.  2. COPD: Continue bronchodilator therapy 3. Pneumonia: Chest x-ray is not that impressive however we'll go ahead and finish out antibiotic course. 4.Atrial fibrillation. Still tachycardiac. Metoprolol increased yesterday with little effect. Discussed with cardiology on an amiodarone  today.  5.CHF: As diuresis. Lasix being stopped today because of increasing creatinine. We'll monitor  6. Hyperkalemia: Resolved  7. Stage 4 CRF; her renal function is worsening with diuresis. Lasix being held today and recheck renal function tomorrow Total time spent 25 minutes :  LOS: 5 days   Gracelyn NurseJohnston,  John D 04/15/2017, 8:38 AM

## 2017-04-15 NOTE — Progress Notes (Signed)
Patient Name: Molly Wolf Date of Encounter: 04/15/2017  Primary Cardiologist: End  Hospital Problem List     Active Problems:   Acute respiratory failure with hypoxia [facility])     Subjective   Patient presents with confusion and is talking to herself during the examination. Poor historian this morning and unable to provide additional ROS information. Presents with very dry mouth and lips sticking to one another. She is unable to respond to questions or requests during physical exam.   Iron studies and mouth care ordered this morning for patient.  Inpatient Medications    Scheduled Meds: . amiodarone  400 mg Oral BID  . atorvastatin  20 mg Oral Daily  . Carbidopa-Levodopa ER  2 tablet Oral QID  . chlorhexidine  15 mL Mouth Rinse BID  . docusate sodium  100 mg Oral BID  . escitalopram  20 mg Oral Daily  . feeding supplement (ENSURE ENLIVE)  237 mL Oral BID BM  . gabapentin  300 mg Oral Daily  . hydrALAZINE  25 mg Oral BID  . insulin aspart  0-9 Units Subcutaneous TID WC  . insulin glargine  24 Units Subcutaneous QHS  . isosorbide mononitrate  30 mg Oral Daily  . mouth rinse  15 mL Mouth Rinse q12n4p  . metoprolol tartrate  100 mg Oral BID  . mirabegron ER  25 mg Oral Daily  . polyethylene glycol  17 g Oral Daily  . pramipexole  0.25 mg Oral TID  . saccharomyces boulardii  250 mg Oral Daily  . scopolamine  1 patch Transdermal Q72H  . warfarin  2 mg Oral Once per day on Sun  . warfarin  3 mg Oral Once per day on Mon Tue Wed Thu Fri Sat  . Warfarin - Physician Dosing Inpatient   Does not apply q1800   Continuous Infusions: . sodium chloride     PRN Meds: acetaminophen **OR** acetaminophen, loratadine, LORazepam, nitroGLYCERIN, ondansetron **OR** ondansetron (ZOFRAN) IV, polyethylene glycol, traMADol   Vital Signs    Vitals:   04/14/17 0818 04/14/17 1200 04/14/17 1955 04/15/17 0423  BP: (!) 139/94 120/82 122/87 (!) 140/99  Pulse: 98 (!) 49 (!) 127 97  Resp: 16 18  16    Temp: 98.5 F (36.9 C) 97.9 F (36.6 C) 98.1 F (36.7 C) 98 F (36.7 C)  TempSrc: Oral Oral Oral Oral  SpO2: 100% 99% 100% 99%  Weight:    164 lb (74.4 kg)  Height:        Intake/Output Summary (Last 24 hours) at 04/15/17 0818 Last data filed at 04/15/17 0427  Gross per 24 hour  Intake              360 ml  Output              700 ml  Net             -340 ml   Filed Weights   04/13/17 0438 04/14/17 0225 04/15/17 0423  Weight: 161 lb (73 kg) 166 lb 1.6 oz (75.3 kg) 164 lb (74.4 kg)    Physical Exam    GEN: Frail appearing, confused, talking to self. Laying in bed. HEENT: Oral mucous membranes extremely dry. Neck: Supple, no JVD, carotid bruits, or masses. Cardiac: Irregularly irregular, II/VI systolic murmur, no rubs, or gallops. No clubbing, cyanosis, edema.  Radials/DP/PT 2+ and equal bilaterally.  Respiratory:  Respirations regular and unlabored, scattered rales, worse at the bases, poor inspiratory effort. On 2L [place].  GI: Soft, nontender, nondistended, Bowel sounds normal and equal in all four quadrants. MS: No deformity, mild muscle atrophy lower extremities. Left leg slightly larger than the right, consistent with previous exam findings.  Skin: Warm and dry, no rash. Neuro:  Strength and sensation are intact. Parkinson's tremor.  Psych: Confused and not oriented to person, place, surroundings. Pleasant affect during exam, despite confusion.  Labs    CBC  Recent Labs  04/12/17 0959 04/14/17 0456  WBC 5.0 5.0  HGB 9.2* 9.3*  HCT 27.6* 27.9*  MCV 84.7 85.7  PLT 148* 142*   Basic Metabolic Panel  Recent Labs  04/14/17 0456 04/15/17 0446  NA 139 139  K 4.5 4.8  CL 104 104  CO2 27 28  GLUCOSE 181* 205*  BUN 47* 50*  CREATININE 2.71* 2.80*  CALCIUM 9.0 9.0   Liver Function Tests No results for input(s): AST, ALT, ALKPHOS, BILITOT, PROT, ALBUMIN in the last 72 hours. No results for input(s): LIPASE, AMYLASE in the last 72 hours. Cardiac Enzymes No  results for input(s): CKTOTAL, CKMB, CKMBINDEX, TROPONINI in the last 72 hours. BNP Invalid input(s): POCBNP D-Dimer No results for input(s): DDIMER in the last 72 hours. Hemoglobin A1C No results for input(s): HGBA1C in the last 72 hours. Fasting Lipid Panel No results for input(s): CHOL, HDL, LDLCALC, TRIG, CHOLHDL, LDLDIRECT in the last 72 hours. Thyroid Function Tests No results for input(s): TSH, T4TOTAL, T3FREE, THYROIDAB in the last 72 hours.  Invalid input(s): FREET3  Telemetry    Afib in 120s bpm - Personally Reviewed  ECG    n/a - Personally Reviewed  Radiology    No results found.  Cardiac Studies   TTE 04/04/17: Study Conclusions  - Left ventricle: LVEF appears depressed at approximately 35% with severe hypokinesis of mid/distal anterior and anterolateral walls. Moderate LVH Atrial fibrillation makes evaluation of LVEF difficult In comparison to echo from April 2018 these findings are more prominent and LVEF is worse.t, But in that study (April 2018) apical windows were foreshortened making some of comparison diffcult - Aortic valve: AV is thickened, calcified with mildly restricted motion. There was mild regurgitation. - Mitral valve: There was moderate regurgitation. - Left atrium: The atrium was moderately dilated. - Right atrium: The atrium was mildly dilated. - Pulmonary arteries: PA peak pressure: 65 mm Hg (S). - Pericardium, extracardiac: A small pericardial effusion was identified.  02/10/17 Long term monitor (14 days) - Predominant NSR with average rate 74 bpm (range 56-103 bpm). Patient triggered event corresponds to sinus rhythm. Occasional PACs; frequent PVCs. Two episodes nonsustained ventricular tachycardia lasting up to 5 beats. 368 episodes of SVT with max rate 182 bpm and longest episode 15 min, 35 sec (average rate 102 bpm). Irregularity during some episodes raises possibility of atrial fibrillation and/or atrial  flutter with variable block. There numerous episodes of atrial tachycardia, possibly Afib vs atrial flutter with variable AV block vs sustained SVT. Most suggestive of SVT but atrial flutter and/or A fib cannot be excluded.  01/26/2017 Echo  -LV wall thickness was increased in pattern of mild LVH with moderate focal basal hypertrophy of septum. Systolic function mildly reduced with EF estimated 45-50%. Akinesis of basal mid inferolateral and inferior myocardium. Features consistent with grade 2 diastolic dysfunction. Aortic valve moderately thickened, mildly calcified leaflets. Cusp separation reduced. Mild to moderate stenosis, mild regurgitation. Mitral valve: mild to moderate regurgitation. Left atrium: mildly dilated. Right ventricle: mildly dilated.   01/26/2017 US venous Img lower bilateral 1. Occlusive  thrombus of the right peroneal vein in the calf. No evidence of additional thrombus of the bilateral lower extremities. 2. Left popliteal fossa complex cyst, probably a Baker cyst.  01/2008 Echo ([facility]): EF > 55%, mod-sev LVH w/ grade 1 DD, biatrial enlargement, mild AS, trace MR/TR; b. EF 45-50%, GR2DD, mild to moderate aortic stenosis, mild aortic insufficieny, mild to moderate mitral regurgitation, mild tricuspid regurgitation, mild biatrial enlargement, and a small pericardial effusion.    Patient Profile     76 y.o. female who is being seen today for the evaluation of syncope and hypoxia at the request of Dr. Letitia Libra, MD. 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 She presents s/p hypoxic episode, syncope, and unresponsiveness / confusion the same day as her discharge from [facility] (admission 6/29-6/29) d/t CHF exacerbation with active GI bleed  and anemia. Of note, GI workup and endoscopy were clear at the time of 6/29 discharge.   Assessment & Plan    1. Acute respiratory failure with hypoxia. - 6/29 CXR not consistent with acute pulmonary edema (recent admission for CHF exacerbation). There is an area of focal opacity in left lung base as well as right middle lobe that likely represents pneumonia. - Previous CT and CXR as above. - Likely exacerbation 2/2 pneumonia and chronic anemia complicated by poorly controlled Afib with RVR. - 04/12/17 Ddimer to r/o PE in setting of DVT with recent sub-therapeutic INR during last admission with recent GI bleed and need for EGD (currently on Warfarin prophylaxis s/p thrombus as above).  --->Ddimer value 867.15 - RBC 3.25, Hbg 9.3. HCT 27.9, plts 142 - Continue to monitor anemia with CBC.  - Current Osat 99-100. Remains on 2L [place].   2. Persistent Afib: - Add amiodarone 400mg  BID. Continue with this dosing for at least 1 week then reduce to 200mg  daily as rate allows.  - Remains poorly rate controlled in the 120s - On metoprolol 100mg  PO BID -CHADS2VASc at least 6 (CHF, HTN, age x 2, DM, female) - Continue warfarin for anticoagulation - 7/2 Ddimer = 867.15 - Not a good candidate for digoxin - Family does not seem to be interested in cardioversion.  - INR 2.67  3. Acute on chronic combined CHF: - No observed pulmonary edema on 6/29 CXR - Ejection fraction 35% on echocardiogram 04/04/2017 with wall motion abnormality - Not on ACE/ARB/Entresto/spiro given CKD stage IV - Continue to monitor renal function closely - Lasix discontinued  -Chronic anemia likely contributing to shortness of breath symptoms. Most recent CBC results as above. -Monitoring basic metabolic panel daily  4. DM2 - Glucose remains elevated at 205 (BMP, 7/5) - Continue basal insulin injection adjusted for hospital diet. Sliding scale insulin as well. - Per IM  5. Acute on CKD stage IV: - Avoid nephrotoxic agents -  Elevated creatinine at 2.8 (7/5) - BUN 50, GFR 15 (7/5) - Continue to monitor renal function with labs - Lasix discontinued - Ordered mouth care instructions to assist with issue of dry mouth  6. Acute on chronic anemia of chronic disease: - Ordered Iron /TIBC & ferritin studies to reassess anemia - Maintain HGB > 8.5  7. DVT prophylaxis:  - 01/26/2017 US venous Img lower bilateral with occlusive thrombus of the right peroneal vein in the  calf. No evidence of additional thrombus of the bilateral lower extremities. - Anticoagulation on warfarin  - 7/2 ddimer results 867.15   8. Hypertension:  - BP 140/99 (7/5) - Continue hydralazine, Imdur, and Lopressor.   9. CAD:  - Stable - Continue Imdur/Lopressor/Lipitor 20mg /Coumadin in place of ASA - Continue hydralazine and nitro - Given her recent drop in EF as above along with her symptoms, consider ischemic evaluation via nuclear stress testing prior to discharge if confusion decreases and improved awareness  10. Parkinson's:  - Continue Sinemet, pramipexole and scopolamine - Per IM - Cannot rule out some component of dysautonomia  - If the above workup is completely unrevealing, consider neuro eval  11. COPD - Managed per IM  12. Remaining managed per IM  High risk of readmission given her frail state  Signed, Signed, Marisue IvanJacquelyn Visser , PA-S 04/15/2017 9:05 AM  Reviewed and edited the note as above. Discussed in rounds.   Signed, Ellsworth LennoxBrittany M Strader, PA-C 04/15/2017, 9:23 AM Pager: [phone]   Attending Note Patient seen and examined, agree with detailed note above,  Patient presentation and plan discussed on rounds.   More confused this morning Worsening renal function creatinine 2.8 Very dry mouth with dry secretions on her lips I have not seen her out of bed up to a recliner  On exam no JVP, appears comfortable, lungs with scattered Rales, poor inspiratory effort, heart sounds irregularly irregular, abdomen  soft nontender, no significant lower extremity edema  ---1. Acute on chronic systolic CHF: Ejection fraction 35% on echocardiogram 04/04/2017 with wall motion abnormality -Not on ACE/ARB/Entresto/spiro given CKD stage IV ----Held Lasix for now given worsening renal function ----Ordered iron panel for anemia, iron is low normal  2. Persistent Afib: On metoprolol 100 twice a day Rate difficult to control even at rest, will start amiodarone 400 mg twice a day for 5 days -CHADS2VASc at least 6 (CHF, HTN, age x 2, DM, female) Not a good candidate for digoxin Family does not seem to be interested in cardioversion We will readdress with family  3. Acute on CKD stage IV: Stable creatinine  4. Acute on chronic anemia of chronic disease: -Maintain HGB > 8.5 -Coumadin on hold  5. Elevated troponin/CAD: -No symptoms concerning for ACS -Likely supply demand ischemia -No plans for inpatient ischemic eval  Greater than 50% was spent in counseling and coordination of care with patient Total encounter time 25 minutes or more   Signed: Dossie Arbourim Holbert Caples  M.D., Ph.D. [facility]

## 2017-04-15 NOTE — Plan of Care (Signed)
Problem: Respiratory: Goal: Ability to achieve and maintain a regular respiratory rate will improve Outcome: Progressing Plan of care included to wean patient off oxygen. Patient more solment and less alert. Will continue oxygen therapy until further notice.

## 2017-04-15 NOTE — Plan of Care (Signed)
Problem: Skin Integrity: Goal: Risk for impaired skin integrity will decrease Outcome: Progressing Skin will remain clean, dry and intact. No new skin breakdown during the shift.

## 2017-04-16 ENCOUNTER — Encounter: Payer: Self-pay | Admitting: *Deleted

## 2017-04-16 DIAGNOSIS — I482 Chronic atrial fibrillation: Secondary | ICD-10-CM

## 2017-04-16 LAB — GLUCOSE, CAPILLARY
Glucose-Capillary: 174 mg/dL — ABNORMAL HIGH (ref 65–99)
Glucose-Capillary: 149 mg/dL — ABNORMAL HIGH (ref 65–99)
Glucose-Capillary: 65 mg/dL (ref 65–99)
Glucose-Capillary: 68 mg/dL (ref 65–99)
Glucose-Capillary: 85 mg/dL (ref 65–99)
Glucose-Capillary: 104 mg/dL — ABNORMAL HIGH (ref 65–99)
Glucose-Capillary: 89 mg/dL (ref 65–99)
Glucose-Capillary: 85 mg/dL (ref 65–99)

## 2017-04-16 LAB — BASIC METABOLIC PANEL
Anion gap: 7 (ref 5–15)
BUN: 55 mg/dL — AB (ref 6–20)
CHLORIDE: 105 mmol/L (ref 101–111)
CO2: 30 mmol/L (ref 22–32)
Calcium: 9 mg/dL (ref 8.9–10.3)
Creatinine, Ser: 3.03 mg/dL — ABNORMAL HIGH (ref 0.44–1.00)
GFR calc non Af Amer: 14 mL/min — ABNORMAL LOW (ref >60)
GFR, EST AFRICAN AMERICAN: 16 mL/min — AB (ref >60)
Glucose, Bld: 91 mg/dL (ref 65–99)
POTASSIUM: 5 mmol/L (ref 3.5–5.1)
SODIUM: 142 mmol/L (ref 135–145)

## 2017-04-16 LAB — CBC
HEMATOCRIT: 28.1 % — AB (ref 35.0–47.0)
Hemoglobin: 9.2 g/dL — ABNORMAL LOW (ref 12.0–16.0)
MCH: 28.3 pg (ref 26.0–34.0)
MCHC: 32.8 g/dL (ref 32.0–36.0)
MCV: 86.3 fL (ref 80.0–100.0)
Platelets: 138 10*3/uL — ABNORMAL LOW (ref 150–440)
RBC: 3.26 MIL/uL — AB (ref 3.80–5.20)
RDW: 15.9 % — ABNORMAL HIGH (ref 11.5–14.5)
WBC: 4.4 10*3/uL (ref 3.6–11.0)

## 2017-04-16 LAB — PROTIME-INR
INR: 3.2
Prothrombin Time: 33.5 seconds — ABNORMAL HIGH (ref 11.4–15.2)

## 2017-04-16 LAB — PROCALCITONIN: Procalcitonin: <0.1 ng/mL

## 2017-04-16 MED ORDER — ENSURE ENLIVE PO LIQD
237.0000 mL | Freq: Three times a day (TID) | ORAL | Status: DC
  Administered 2017-04-16 – 2017-04-21 (×14): 237 mL via ORAL

## 2017-04-16 MED ORDER — BISACODYL 5 MG PO TBEC
5.0000 mg | DELAYED_RELEASE_TABLET | Freq: Every day | ORAL | Status: DC | PRN
  Filled 2017-04-16: qty 1

## 2017-04-16 MED ORDER — SODIUM CHLORIDE 0.9 % IV SOLN
INTRAVENOUS | Status: AC
  Administered 2017-04-16: 08:00:00 via INTRAVENOUS

## 2017-04-16 MED ORDER — WARFARIN SODIUM 1 MG PO TABS: 1.0000 mg | ORAL_TABLET | ORAL | Status: DC

## 2017-04-16 MED ORDER — INSULIN GLARGINE 100 UNIT/ML ~~LOC~~ SOLN
12.0000 [IU] | Freq: Every day | SUBCUTANEOUS | Status: DC
  Administered 2017-04-16: 12 [IU] via SUBCUTANEOUS
  Filled 2017-04-16 (×2): qty 0.12

## 2017-04-16 MED ORDER — WARFARIN SODIUM 2 MG PO TABS: 2.0000 mg | ORAL_TABLET | ORAL | Status: DC

## 2017-04-16 MED ORDER — ADULT MULTIVITAMIN LIQUID CH
15.0000 mL | Freq: Every day | ORAL | Status: DC
  Administered 2017-04-16 – 2017-04-27 (×10): 15 mL via ORAL
  Filled 2017-04-16 (×12): qty 15

## 2017-04-16 MED ORDER — WARFARIN - PHARMACIST DOSING INPATIENT: Freq: Every day | Status: DC

## 2017-04-16 NOTE — Plan of Care (Signed)
Problem: Safety: Goal: Ability to remain free from injury will improve Outcome: Progressing Pt had a safety sitter last night doe to confusion and being impulsive yesterday during the day, the pt is a lot more oriented now, no sitter needed for today.  Problem: Pain Managment: Goal: General experience of comfort will improve Outcome: Progressing No complaints of pain this shift, will continue to monitor.

## 2017-04-16 NOTE — Progress Notes (Signed)
Subjective: Patient complains of shortness of breath with minimal exertion. However he satting 95% on 1 L O2. Still gets tachycardic with minimal activity. She is much more awake and alert today.  Objective: Vital signs in last 24 hours: Temp:  [97.5 F (36.4 C)-98.2 F (36.8 C)] 97.5 F (36.4 C) (07/06 0444) Pulse Rate:  [85-118] 87 (07/06 0749) Resp:  [16-18] 16 (07/06 0741) BP: (115-135)/(83-99) 128/99 (07/06 0741) SpO2:  [95 %-100 %] 98 % (07/06 0749) Weight:  [64.4 kg (142 lb)] 64.4 kg (142 lb) (07/06 0500) Weight change: -9.979 kg (-22 lb) Last BM Date:  (unknown)  Intake/Output from previous day: 07/05 0701 - 07/06 0700 In: 0  Out: 250 [Urine:250] Intake/Output this shift: No intake/output data recorded.  General appearance: no distress Resp: clear to auscultation bilaterally and normal percussion bilaterally Cardio: irregularly irregular rhythm GI: soft, non-tender; bowel sounds normal; no masses,  no organomegaly Neurologic: Motor: Tremors.  Lab Results:  Recent Labs  04/14/17 0456  WBC 5.0  HGB 9.3*  HCT 27.9*  PLT 142*   BMET  Recent Labs  04/15/17 0446 04/16/17 0443  NA 139 142  K 4.8 5.0  CL 104 105  CO2 28 30  GLUCOSE 205* 91  BUN 50* 55*  CREATININE 2.80* 3.03*  CALCIUM 9.0 9.0    Studies/Results: Ct Head Wo Contrast  Result Date: 04/15/2017 CLINICAL DATA:  Confusion. EXAM: CT HEAD WITHOUT CONTRAST TECHNIQUE: Contiguous axial images were obtained from the base of the skull through the vertex without intravenous contrast. COMPARISON:  04/09/2017 FINDINGS: Brain: There is central and cortical atrophy. Periventricular white matter changes are consistent with small vessel disease. There is no intra or extra-axial fluid collection or mass lesion. The basilar cisterns and ventricles have a normal appearance. There is no CT evidence for acute infarction or hemorrhage. Vascular: There is atherosclerotic calcification of the carotid siphonsand vertebral  arteries. Skull: Hyperostosis. Negative for fracture or focal lesion. Sinuses/Orbits: No acute finding. Other: None IMPRESSION: 1.  No evidence for acute intracranial abnormality. 2. Atrophy and small vessel disease. Electronically Signed   By: Norva PavlovElizabeth  Brown M.D.   On: 04/15/2017 09:28    Medications: I have reviewed the patient's current medications.  Assessment/Plan: 1. Acute Respiratory Failure:Likely secondary to acute on chronic CHFand pneumonia.  patient has been diuresed and treated for pneumonia. She still complains of shortness of breath although her O2 sats are 95% on 1 L. We'll try to wean oxygen today. 2. COPD: Continue bronchodilator therapy 3. Pneumonia: Chest x-ray is not that impressive however we'll go ahead and finish out antibiotic course. 4.Atrial fibrillation. Still tachycardiac. Amiodarone started yesterday. Still gets tachycardic with minimal activity. We'll discuss with cardiology  5.CHF: As diuresis. Lasix being stopped today because of increasing creatinine. Lungs sound clear today. 6. Hyperkalemia: Resolved  7. Stage 4 CRF. She has acute on chronic renal failure secondary to diuresis. Lasix been held from us 2 days now. We'll go ahead and gently give her back in fluid. Monitor her CHF repeat renal function panel tomorrow. Total time spent 25 minutes  ;  LOS: 6 days   Gracelyn NurseJohnston,  John D 04/16/2017, 7:49 AM

## 2017-04-16 NOTE — Progress Notes (Signed)
Patient Name: Molly Wolf Date of Encounter: 04/16/2017  Primary Cardiologist: End  Hospital Problem List     Active Problems:   Acute respiratory failure with hypoxia (HCC)     Subjective   Confusion improved. Remains SOB. Remains in Afib with reasonably controlled rates in the 80s to 90s bpm mostly, rare episode into the 120s bpm. No recent cbc. Renal function and potassium trending upwards.   Inpatient Medications    Scheduled Meds: . amiodarone  400 mg Oral BID  . atorvastatin  20 mg Oral Daily  . Carbidopa-Levodopa ER  2 tablet Oral QID  . chlorhexidine  15 mL Mouth Rinse BID  . docusate sodium  100 mg Oral BID  . escitalopram  20 mg Oral Daily  . feeding supplement (ENSURE ENLIVE)  237 mL Oral BID BM  . gabapentin  300 mg Oral Daily  . hydrALAZINE  25 mg Oral BID  . insulin aspart  0-9 Units Subcutaneous TID WC  . insulin glargine  24 Units Subcutaneous QHS  . isosorbide mononitrate  30 mg Oral Daily  . mouth rinse  15 mL Mouth Rinse q12n4p  . metoprolol tartrate  100 mg Oral BID  . mirabegron ER  25 mg Oral Daily  . saccharomyces boulardii  250 mg Oral Daily  . scopolamine  1 patch Transdermal Q72H  . warfarin  2 mg Oral Once per day on Sun  . warfarin  3 mg Oral Once per day on Mon Tue Wed Thu Fri Sat  . Warfarin - Physician Dosing Inpatient   Does not apply q1800   Continuous Infusions: . sodium chloride    . sodium chloride     PRN Meds: acetaminophen **OR** acetaminophen, bisacodyl, loratadine, LORazepam, nitroGLYCERIN, ondansetron **OR** ondansetron (ZOFRAN) IV, polyethylene glycol, traMADol   Vital Signs    Vitals:   04/16/17 0500 04/16/17 0741 04/16/17 0746 04/16/17 0749  BP:  (!) 128/99    Pulse:  85 94 87  Resp:  16    Temp:      TempSrc:      SpO2:  100% 95% 98%  Weight: 142 lb (64.4 kg)     Height:        Intake/Output Summary (Last 24 hours) at 04/16/17 0801 Last data filed at 04/16/17 0350  Gross per 24 hour  Intake                 0 ml  Output              250 ml  Net             -250 ml   Filed Weights   04/14/17 0225 04/15/17 0423 04/16/17 0500  Weight: 166 lb 1.6 oz (75.3 kg) 164 lb (74.4 kg) 142 lb (64.4 kg)    Physical Exam    GEN: Frail appearing, in no acute distress. Tremor noted.  HEENT: Grossly normal.  Neck: Supple, no JVD, carotid bruits, or masses. Cardiac: Irregularly irregular, II/VI systolic murmur, no rubs, or gallops. No clubbing, cyanosis, edema.  Radials/DP/PT 2+ and equal bilaterally.  Respiratory:  Diminished breathsounds bilaterally. GI: Soft, nontender, nondistended, BS + x 4. MS: no deformity or atrophy. Skin: warm and dry, no rash. Neuro:  Strength and sensation are intact. Psych: A.  Normal affect.  Labs    CBC  Recent Labs  04/14/17 0456  WBC 5.0  HGB 9.3*  HCT 27.9*  MCV 85.7  PLT 142*   Basic Metabolic Panel  Recent Labs  04/15/17 0446 04/16/17 0443  NA 139 142  K 4.8 5.0  CL 104 105  CO2 28 30  GLUCOSE 205* 91  BUN 50* 55*  CREATININE 2.80* 3.03*  CALCIUM 9.0 9.0   Liver Function Tests No results for input(s): AST, ALT, ALKPHOS, BILITOT, PROT, ALBUMIN in the last 72 hours. No results for input(s): LIPASE, AMYLASE in the last 72 hours. Cardiac Enzymes No results for input(s): CKTOTAL, CKMB, CKMBINDEX, TROPONINI in the last 72 hours. BNP Invalid input(s): POCBNP D-Dimer No results for input(s): DDIMER in the last 72 hours. Hemoglobin A1C No results for input(s): HGBA1C in the last 72 hours. Fasting Lipid Panel No results for input(s): CHOL, HDL, LDLCALC, TRIG, CHOLHDL, LDLDIRECT in the last 72 hours. Thyroid Function Tests No results for input(s): TSH, T4TOTAL, T3FREE, THYROIDAB in the last 72 hours.  Invalid input(s): FREET3  Telemetry    Afib, 80s to 90s bpm mostly with rare episode of Afib with RVR into the 120s bpm - Personally Reviewed  ECG    n/a - Personally Reviewed  Radiology    Ct Head Wo Contrast  Result Date:  04/15/2017 CLINICAL DATA:  Confusion. EXAM: CT HEAD WITHOUT CONTRAST TECHNIQUE: Contiguous axial images were obtained from the base of the skull through the vertex without intravenous contrast. COMPARISON:  04/09/2017 FINDINGS: Brain: There is central and cortical atrophy. Periventricular white matter changes are consistent with small vessel disease. There is no intra or extra-axial fluid collection or mass lesion. The basilar cisterns and ventricles have a normal appearance. There is no CT evidence for acute infarction or hemorrhage. Vascular: There is atherosclerotic calcification of the carotid siphonsand vertebral arteries. Skull: Hyperostosis. Negative for fracture or focal lesion. Sinuses/Orbits: No acute finding. Other: None IMPRESSION: 1.  No evidence for acute intracranial abnormality. 2. Atrophy and small vessel disease. Electronically Signed   By: Norva Pavlov M.D.   On: 04/15/2017 09:28    Cardiac Studies   TTE 04/04/17: Study Conclusions  - Left ventricle: LVEF appears depressed at approximately 35% with severe hypokinesis of mid/distal anterior and anterolateral walls. Moderate LVH Atrial fibrillation makes evaluation of LVEF difficult In comparison to echo from April 2018 these findings are more prominent and LVEF is worse.t, But in that study (April 2018) apical windows were foreshortened making some of comparison diffcult - Aortic valve: AV is thickened, calcified with mildly restricted motion. There was mild regurgitation. - Mitral valve: There was moderate regurgitation. - Left atrium: The atrium was moderately dilated. - Right atrium: The atrium was mildly dilated. - Pulmonary arteries: PA peak pressure: 65 mm Hg (S). - Pericardium, extracardiac: A small pericardial effusion was identified.  02/10/17 Long term monitor (14 days) - Predominant NSR with average rate 74 bpm (range 56-103 bpm). Patient triggered event corresponds to sinus rhythm.  Occasional PACs; frequent PVCs. Two episodes nonsustained ventricular tachycardia lasting up to 5 beats. 368 episodes of SVT with max rate 182 bpm and longest episode 15 min, 35 sec (average rate 102 bpm). Irregularity during some episodes raises possibility of atrial fibrillation and/or atrial flutter with variable block. There numerous episodes of atrial tachycardia, possibly Afib vs atrial flutter with variable AV block vs sustained SVT. Most suggestive of SVT but atrial flutter and/or A fib cannot be excluded.  01/26/2017 Echo  -LV wall thickness was increased in pattern of mild LVH with moderate focal basal hypertrophy of septum. Systolic function mildly reduced with EF estimated 45-50%. Akinesis of basal mid inferolateral and inferior myocardium.  Features consistent with grade 2 diastolic dysfunction. Aortic valve moderately thickened, mildly calcified leaflets. Cusp separation reduced. Mild to moderate stenosis, mild regurgitation. Mitral valve: mild to moderate regurgitation. Left atrium: mildly dilated. Right ventricle: mildly dilated.   01/26/2017 US venous Img lower bilateral 1. Occlusive thrombus of the right peroneal vein in the calf. No evidence of additional thrombus of the bilateral lower extremities. 2. Left popliteal fossa complex cyst, probably a Baker cyst.  01/2008 Echo ([facility]): EF > 55%, mod-sev LVH w/ grade 1 DD, biatrial enlargement, mild AS, trace MR/TR; b. EF 45-50%, GR2DD, mild to moderate aortic stenosis, mild aortic insufficieny, mild to moderate mitral regurgitation, mild tricuspid regurgitation, mild biatrial enlargement, and a small pericardial effusion.    Patient Profile     76 y.o. female who is being seen today for the evaluation of syncope and hypoxiaat the request of Dr. Letitia LibraJohnston, MD. 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 She presents s/phypoxic episode, syncope, and unresponsiveness / confusion [facility] d/t CHF exacerbation with active GI bleed and anemia. Of note, GI workup and endoscopy were clear at the time of 6/29 discharge.   Assessment & Plan    1. Acute respiratory failure with hypoxia. - 6/29 CXRnot consistent with acute pulmonary edema (recent admission for CHF exacerbation). There is an area of focal opacity in left lung base as well as right middle lobe that likely represents pneumonia. - Previous CT and CXR as above. - Likely exacerbation 2/2 pneumonia and chronic anemia complicated by poorly controlled Afib with RVR. - 04/12/17 Ddimer to r/o PE in setting of DVT with recent sub-therapeutic INR during last admission with recent GI bleed and need for EGD(currently on Warfarin prophylaxis s/p thrombus as above).  - Ddimer value 867.15, consider imaging, defer to IM - Likely multifactorial including PNA, worsening anemia, Afib with RVR, and acute on chronic combined CHF - No recent CBC - Current Osat 95%. Remains on 2L [place].   2. Persistent Afib: - Continue amiodarone 400mg  BID x 1 week, 200 mg bid x 1 week, then 200 mg daily thereafter  - Rates mostly improved today - On metoprolol 100mg  PO BID -CHADS2VASc at least 6 (CHF, HTN, age x 2, DM, female) - Continue warfarin for anticoagulation - Not a good candidate for digoxin given advanced age and renal function - Not a good candidate for CCB given CM - Family does not seem to be interested in cardioversion.  - INR 3.2  3. Acute on chronic combined CHF: - No observed pulmonary edema on 6/29 CXR - Ejection fraction 35% on echocardiogram 04/04/2017 with wall motion abnormality - Not on ACE/ARB/Entresto/spiro given CKD stage IV - Continue to monitor renal function closely - Lasix  discontinued  -Chronic anemia likely contributing to shortness of breath symptoms. Most recent CBC results as above. -Monitoring basic metabolic panel daily  4. DM2 - Glucose remains elevated at 205 (BMP, 7/5) - Continue basal insulin injection adjusted for hospital diet. Sliding scale insulin as well. - Per IM  5. Acute on CKD stage IV: - Avoid nephrotoxic agents - Elevated creatinine at 3.03 (7/6) - Continue to monitor renal function with labs - Lasix discontinued - Ordered mouth care instructions to assist with issue  of dry mouth  6. Acute on chronic anemia of chronic disease: - Ordered Iron /TIBC & ferritin studies to reassess anemia - Maintain HGB > 8.5 - Recommend CBC  7. DVT prophylaxis:  - 01/26/2017 US venous Img lower bilateral with occlusive thrombus of the right peroneal vein in the calf. No evidence of additional thrombus of the bilateral lower extremities. - Anticoagulation on warfarin - 7/2 ddimer results 867.15   8. Hypertension:  - BP 140/99 (7/5) - Continue hydralazine, Imdur, and Lopressor.   9. CAD:  - Stable - Continue Imdur/Lopressor/Lipitor 20mg /Coumadin in place of ASA - Continue hydralazine and nitro - Given her recent drop in EF as above along with her symptoms, consider ischemic evaluation via nuclear stress testing prior to discharge if confusion decreases and improved awareness  10. Parkinson's:  - Continue Sinemet, pramipexole and scopolamine - Per IM - Cannot rule out some component of dysautonomia  - If the above workup is completely unrevealing, consider neuro eval  11. COPD - Managed per IM  12. Remaining managed per IM  High risk of readmission given her frail state  Signed, Carola Frost [facility] Pager: [phone] 04/16/2017, 8:01 AM

## 2017-04-16 NOTE — Progress Notes (Signed)
Nutrition Follow-up  DOCUMENTATION CODES:   Not applicable  INTERVENTION:  1. Ensure Enlive po TID, each supplement provides 350 kcal and 20 grams of protein 2. Magic cup TID with meals, each supplement provides 290 kcal and 9 grams of protein 3. MVI w/ Minerals (liquid) 4. Recommended downgrading diet to dysphagia 3 with thins, but patient refusing at this time. Also refusing any assistance with meals, which it appears she would benefit from.  NUTRITION DIAGNOSIS:   Malnutrition related to acute illness as evidenced by energy intake < or equal to 50% for > or equal to 5 days, severe depletion of body fat, severe depletion of muscle mass, percent weight loss, moderate depletion of body fat. -new dx  GOAL:   Patient will meet greater than or equal to 90% of their needs -not meeting  MONITOR:   PO intake, Supplement acceptance, Labs, Weight trends, I & O's  ASSESSMENT:   76 year old female with PMHx of HTN, CAD, Parkinson's disease, HLD, anxiety, depression, CKD stage IV, COPD, chronic combined systolic and diastolic CHF, DM type 2, hx of DVT who presented after an episode of unresponsiveness. Patient with recent admission 6/23-6/29 for acute on chronic CHF, acute on chronic kidney disease.  Patient somnolent, muttering incoherently during visit. Received ativan at [phone] Per RN, patient's PO has declined drastically over the past 48 hours, though patient does consume ensure BID daily, based on documented PO intake she is otherwise not eating. 100% PO at one meal, over the past 5 days. From previous RD note, patient has trouble swallowing related to parkinson's but refuses to consume modified diet, which may indicate which PO intake is poor. Will increase ensure to TID.  If she has just been consuming Ensure BID so far, she is consuming ~700 calories (47% estimated needs), and 40gm protein (57% estimated needs) She complained of upper abdominal pain today, noted no BM since 6/30, RN  rectal exam performed with no stool found. - Receiving glycolax and colace Currently demonstrates a 10#/7% severe wt loss over 1 week since admission. Nutrition-Focused physical exam completed. Findings are moderate-severe fat depletion, moderate-severe muscle depletion, and no edema.   Labs and medications reviewed: BUN/Creatinine 55/3.03 Carbidopa-levodopa, Colace, Amiodarone NS @ 2350mL/hr   Diet Order:  Diet heart healthy/carb modified Room service appropriate? Yes; Fluid consistency: Thin  Skin:  Wound (see comment) (MSAD to buttocks)  Last BM:  04/10/2017 - type 7  Height:   Ht Readings from Last 1 Encounters:  04/09/17 5\' 7"  (1.702 m)    Weight:   Wt Readings from Last 1 Encounters:  04/16/17 142 lb (64.4 kg)    Ideal Body Weight:  61.4 kg  BMI:  Body mass index is 22.24 kg/m.  Estimated Nutritional Needs:   Kcal:  1470-1715 (MSJ x 1.2-1.4)  Protein:  70-85 grams (1-1.2 grams/kg)  Fluid:  1.7 L/day (25 ml/kg)  EDUCATION NEEDS:   No education needs identified at this time  Dionne AnoWilliam M. Maricia Scotti, MS, RD LDN Inpatient Clinical Dietitian Pager [phone]

## 2017-04-16 NOTE — Progress Notes (Addendum)
Anxious and agitated. C/o trouble breathing. O2 sats on room air  in high 90's.  States she wants to feel normal.  Then says she knows there is nothing we can do about.  Voices fear of going home while early she was pleased that she may be discharged.  Ativan 1 mg. Po given.  Restarted on 1L [place] for comfort.

## 2017-04-16 NOTE — Progress Notes (Signed)
ANTICOAGULATION CONSULT NOTE - Initial Consult  Pharmacy Consult for warfarin Indication: atrial fibrillation  No Known Allergies  Patient Measurements: Height: _0  (170.2 cm) Weight: 142 lb (64.4 kg) IBW/kg (Calculated) : 61.6 Heparin Dosing Weight:   Vital Signs: Temp: 97.5 F (36.4 C) (07/06 0444) Temp Source: Oral (07/06 0444) BP: 128/99 (07/06 0741) Pulse Rate: 75 (07/06 0855)  Labs:  Recent Labs  04/14/17 0456 04/15/17 0446 04/16/17 0443  HGB 9.3*  --  9.2*  HCT 27.9*  --  28.1*  PLT 142*  --  138*  LABPROT 29.0* 28.3* 33.5*  INR 2.67 2.59 3.20  CREATININE 2.71* 2.80* 3.03*    Estimated Creatinine Clearance: 15.1 mL/min (A) (by C-G formula based on SCr of 3.03 mg/dL (H)).   Medical History: Past Medical History:  Diagnosis Date  . Anxiety   . Arthritis   . Chronic combined systolic and diastolic CHF (congestive heart failure) ([place])    a. 01/2008 Echo ([facility]): EF > 55%, mod-sev LVH w/ grade 1 DD, biatrial enlargement, mild AS, trace MR/TR; b. EF 45-50%, GR2DD, mild to moderate aortic stenosis, mild aortic insufficieny, mild to moderate mitral regurgitation, mild tricuspid regurgitation, mild biatrial enlargement, and a small pericardial effusion  . CKD (chronic kidney disease), stage IV ([place])   . Closed patellar sleeve fracture of right knee    a. 01/2017 - conservatively managed.  Marland Kitchen COPD (chronic obstructive pulmonary disease) ([place])   . Coronary artery disease    a. s/p remote stenting of unknown vessel @ [facility].  . Depression   . Diabetes mellitus with complication ([place])   . DVT (deep venous thrombosis) (HCC)    a. in the setting of right patellar fracture on Coumadin  . Hyperlipidemia   . Hypertension   . Parkinson disease ([place])     Medications:  Prescriptions Prior to Admission  Medication Sig Dispense Refill Last Dose  . acetaminophen (TYLENOL) 325 MG tablet Take 650 mg by mouth every 6 (six) hours as needed for mild pain.   prn at prn  .  atorvastatin (LIPITOR) 20 MG tablet Take 20 mg by mouth daily.   Past Week at Unknown time  . blood glucose meter kit and supplies KIT Dispense based on patient and insurance preference. Use up to four times daily as directed. (FOR ICD-9 250.00, 250.01). 1 each 0 Past Week at Unknown time  . Carbidopa-Levodopa ER (SINEMET CR) 25-100 MG tablet controlled release Take 2 tablets by mouth 4 (four) times daily. 240 tablet 1 Past Week at Unknown time  . Coenzyme Q10 (COQ10) 50 MG CAPS Take 50 mg by mouth daily.   Past Week at Unknown time  . CRANBERRY PO Take 1 tablet by mouth daily.   Past Week at Unknown time  . escitalopram (LEXAPRO) 20 MG tablet Take 20 mg by mouth daily.   Past Week at Unknown time  . furosemide (LASIX) 20 MG tablet Take 1 tablet (20 mg total) by mouth daily. 30 tablet 2 Past Week at Unknown time  . gabapentin (NEURONTIN) 300 MG capsule Take 300 mg by mouth daily.    Past Week at Unknown time  . glucose blood (ACCU-CHEK AVIVA PLUS) test strip Use as instructed 100 each 12 Past Week at Unknown time  . hydrALAZINE (APRESOLINE) 25 MG tablet Take 1 tablet (25 mg total) by mouth 2 (two) times daily. 180 tablet 3 Past Week at Unknown time  . insulin glargine (LANTUS) 100 UNIT/ML injection Inject 40 Units into the skin  at bedtime.    Past Week at Unknown time  . insulin lispro (HUMALOG) 100 UNIT/ML injection Inject 6 Units into the skin 3 (three) times daily with meals.    Past Week at Unknown time  . isosorbide mononitrate (IMDUR) 60 MG 24 hr tablet Take 1 tablet (60 mg total) by mouth daily. 30 tablet 2 Past Week at Unknown time  . loratadine (CLARITIN) 10 MG tablet Take 5 mg by mouth daily as needed for allergies.    prn at prn  . LORazepam (ATIVAN) 1 MG tablet Take 1 tablet (1 mg total) by mouth 2 (two) times daily as needed for anxiety. 5 tablet 0 prn at prn  . metoprolol tartrate (LOPRESSOR) 50 MG tablet Take 1 tablet (50 mg total) by mouth 2 (two) times daily. 180 tablet 3 Past Week at  Unknown time  . mirabegron ER (MYRBETRIQ) 25 MG TB24 tablet Take 25 mg by mouth daily.   Past Week at Unknown time  . nitroGLYCERIN (NITROSTAT) 0.4 MG SL tablet Place 0.4 mg under the tongue every 5 (five) minutes as needed for chest pain.   prn at prn  . Omega-3 Fatty Acids (FISH OIL) 1000 MG CAPS Take 1,000 mg by mouth daily.   Past Week at Unknown time  . polyethylene glycol (MIRALAX / GLYCOLAX) packet Take 17 g by mouth daily as needed for mild constipation.   prn at prn  . promethazine (PHENERGAN) 25 MG tablet Take 25 mg by mouth every 6 (six) hours as needed for nausea or vomiting.   prn at prn  . saccharomyces boulardii (FLORASTOR) 250 MG capsule Take 250 mg by mouth daily.   Past Week at Unknown time  . traMADol (ULTRAM) 50 MG tablet Take 1 tablet (50 mg total) by mouth daily as needed. (Patient taking differently: Take 50 mg by mouth daily as needed for moderate pain. ) 5 tablet 0 prn at prn  . warfarin (COUMADIN) 2 MG tablet Take 1 tablet (2 mg total) by mouth as directed. Every Sunday 30 tablet 0 Past Week at Unknown time  . warfarin (COUMADIN) 3 MG tablet Take 1 tablet (3 mg total) by mouth daily at 6 PM. Mon,tue,wed,thur,friday, sat 30 tablet 1 Past Week at Unknown time   Scheduled:  . amiodarone  400 mg Oral BID  . atorvastatin  20 mg Oral Daily  . Carbidopa-Levodopa ER  2 tablet Oral QID  . chlorhexidine  15 mL Mouth Rinse BID  . docusate sodium  100 mg Oral BID  . escitalopram  20 mg Oral Daily  . feeding supplement (ENSURE ENLIVE)  237 mL Oral BID BM  . gabapentin  300 mg Oral Daily  . hydrALAZINE  25 mg Oral BID  . insulin aspart  0-9 Units Subcutaneous TID WC  . insulin glargine  24 Units Subcutaneous QHS  . isosorbide mononitrate  30 mg Oral Daily  . mouth rinse  15 mL Mouth Rinse q12n4p  . metoprolol tartrate  100 mg Oral BID  . mirabegron ER  25 mg Oral Daily  . saccharomyces boulardii  250 mg Oral Daily  . scopolamine  1 patch Transdermal Q72H  . [START ON  04/18/2017] warfarin  1 mg Oral Once per day on Sun  . [START ON 04/17/2017] warfarin  2 mg Oral Once per day on Mon Tue Wed Thu Fri Sat  . Warfarin - Pharmacist Dosing Inpatient   Does not apply q1800    Assessment: Pharmacy consulted to dose and monitor  warfarin therapy in this 76 year old patient chronically on warfarin.  Home dose: 3 mg daily except 2 mg on sundays.   Patient has been initiated on amiodarone Goal of Therapy:  INR 2-3 Monitor platelets by anticoagulation protocol: Yes   Plan:  Will hold warfarin dose today due to significant jump overnight. Will also reduce warfarin doses thereafter.    Dent Plantz D 04/16/2017,11:22 AM

## 2017-04-16 NOTE — Progress Notes (Signed)
PT Cancellation Note  Patient Details Name: Molly Wolf MRN: [id number] DOB: 05-14-1941   Cancelled Treatment:    Reason Eval/Treat Not Completed: Fatigue/lethargy limiting ability to participate. Pt able to briefly wake from deep sleep with verbal and tactile cueing. Pt occasionally following verbal commands but unable to stay awake long enough to meaningfully participate in physical therapy. Nursing notified. Re-attempt PT treatment at later date/time.   Gwenlyn FoundRiley Jerod Mcquain, SPT 04/16/17,3:23 PM [phone]

## 2017-04-16 NOTE — Progress Notes (Signed)
Patient has been sleeping all afternoon.  Awakens easily, sometimes she makes sense and others she does not.  Falls asleep again immediately.  Patient seemed to choke after taking taking afternoon meds.  This is not a new occurrence, patients daughter does not want to pursue it with a swallow evaluation.

## 2017-04-16 NOTE — Care Management Important Message (Signed)
Important Message  Patient Details  Name: Molly Wolf MRN: [id number] Date of Birth: [birth year]   Medicare Important Message Given:  Yes Signed IM notice given    Eber HongGreene, Jotham Ahn R, RN 04/16/2017, 7:49 AM

## 2017-04-16 NOTE — Progress Notes (Signed)
Lantus 24 units D/C and received new order for Lantus 12 units  from Dr. Emmit PomfretHugelmeyer. CBG=85 mg/dL tonight.  Will offer patient some orange juice to avoid blood sugar dropping in the morning. Will continue to monitor.

## 2017-04-16 NOTE — Clinical Social Work Note (Signed)
CSW spoke to patient's daughter Molly Wolf, [phone] and she has changed her mind in regards to which SNF she wants to go to.  Patient and her daughter chose [facility] [address].  [facility] [place] said they can accept patient once she is medically ready for discharge and orders have been received.  Molly Wolf, MSW, Molly Wolf [phone]  04/16/2017 5:49 PM

## 2017-04-16 NOTE — Progress Notes (Addendum)
IV infiltrated and removed. One unsuccessful attempt at re starting her IV.  IV team consulted.

## 2017-04-16 NOTE — Progress Notes (Signed)
Patient complaining of upper abdominal pain.  Moaning.  Rectal exam performed, no stool in vault.  No signs of impaction.

## 2017-04-16 NOTE — Progress Notes (Signed)
Hypoglycemic Event  CBG: 65 Treatment:orange juice Symptoms:none Follow-up CBG: Time 0818 CBG Result:68  Treatment:orange juice and breakfast arrived Symptoms:none Follow-up CBG: Time 0859 CBG Result:85 Possible Reasons for Event: poor eating  Comments/MD notified:Dr. Letitia LibraJohnston, verbally notified.  He dc'd serum glucose    Sheron NightingaleJancy D Tylerjames Hoglund

## 2017-04-16 NOTE — Progress Notes (Signed)
Inpatient Diabetes Program Recommendations  AACE/ADA: New Consensus Statement on Inpatient Glycemic Control (2015)  Target Ranges:  Prepandial:   less than 140 mg/dL      Peak postprandial:   less than 180 mg/dL (1-2 hours)      Critically ill patients:  140 - 180 mg/dL   Results for Molly Wolf, Molly Wolf (MRN [id number]) as of 04/16/2017 10:17  Ref. Range 04/16/2017 07:43  Glucose-Capillary Latest Ref Range: 65 - 99 mg/dL 65    Admit with: Acute Resp Failure   History: DM, CHF, CKD  Home DM Meds: Lantus 40 units QHS      Humalog 6 units TID  Current Insulin Orders: Lantus 24 units QHS      Novolog Sensitive Correction Scale/ SSI (0-9 units) TID AC      MD- Patient with Hypoglycemia this AM after receiving 24 units Lantus last PM.  Please consider reducing Lantus to 20 units QHS     --Will follow patient during hospitalization--  Amb[place] FinlandJeannine Johnston Svara Twyman RN, MSN, CDE Diabetes Coordinator Inpatient Glycemic Control Team Team Pager: [phone] (8a-5p)

## 2017-04-17 LAB — GLUCOSE, CAPILLARY
Glucose-Capillary: 99 mg/dL (ref 65–99)
Glucose-Capillary: 69 mg/dL (ref 65–99)
Glucose-Capillary: 106 mg/dL — ABNORMAL HIGH (ref 65–99)
Glucose-Capillary: 160 mg/dL — ABNORMAL HIGH (ref 65–99)

## 2017-04-17 LAB — GASTROINTESTINAL PANEL BY PCR, STOOL (REPLACES STOOL CULTURE)
Adenovirus F40/41: NOT DETECTED
Astrovirus: NOT DETECTED
Campylobacter species: NOT DETECTED
Cryptosporidium: NOT DETECTED
Cyclospora cayetanensis: NOT DETECTED
ENTEROAGGREGATIVE E COLI (EAEC): NOT DETECTED
ENTEROTOXIGENIC E COLI (ETEC): NOT DETECTED
Entamoeba histolytica: NOT DETECTED
Enteropathogenic E coli (EPEC): NOT DETECTED
GIARDIA LAMBLIA: NOT DETECTED
NOROVIRUS GI/GII: NOT DETECTED
Plesimonas shigelloides: NOT DETECTED
ROTAVIRUS A: NOT DETECTED
SALMONELLA SPECIES: NOT DETECTED
SHIGA LIKE TOXIN PRODUCING E COLI (STEC): NOT DETECTED
SHIGELLA/ENTEROINVASIVE E COLI (EIEC): NOT DETECTED
Sapovirus (I, II, IV, and V): NOT DETECTED
Vibrio cholerae: NOT DETECTED
Vibrio species: NOT DETECTED
Yersinia enterocolitica: NOT DETECTED

## 2017-04-17 LAB — PROTIME-INR
INR: 4.29 — AB
PROTHROMBIN TIME: 42.3 s — AB (ref 11.4–15.2)

## 2017-04-17 LAB — BASIC METABOLIC PANEL
Anion gap: 8 (ref 5–15)
BUN: 62 mg/dL — AB (ref 6–20)
CALCIUM: 8.6 mg/dL — AB (ref 8.9–10.3)
CO2: 28 mmol/L (ref 22–32)
Chloride: 105 mmol/L (ref 101–111)
Creatinine, Ser: 3.52 mg/dL — ABNORMAL HIGH (ref 0.44–1.00)
GFR calc Af Amer: 13 mL/min — ABNORMAL LOW (ref >60)
GFR, EST NON AFRICAN AMERICAN: 12 mL/min — AB (ref >60)
Glucose, Bld: 113 mg/dL — ABNORMAL HIGH (ref 65–99)
Potassium: 5.3 mmol/L — ABNORMAL HIGH (ref 3.5–5.1)
Sodium: 141 mmol/L (ref 135–145)

## 2017-04-17 LAB — C DIFFICILE QUICK SCREEN W PCR REFLEX
C DIFFICILE (CDIFF) INTERP: NOT DETECTED
C Diff antigen: NEGATIVE
C Diff toxin: NEGATIVE

## 2017-04-17 MED ORDER — SODIUM CHLORIDE 0.9 % IV BOLUS (SEPSIS)
500.0000 mL | Freq: Once | INTRAVENOUS | Status: AC
  Administered 2017-04-17: 500 mL via INTRAVENOUS

## 2017-04-17 MED ORDER — HYDRALAZINE HCL 10 MG PO TABS
10.0000 mg | ORAL_TABLET | Freq: Two times a day (BID) | ORAL | Status: DC
  Administered 2017-04-17 – 2017-04-27 (×17): 10 mg via ORAL
  Filled 2017-04-17 (×18): qty 1

## 2017-04-17 MED ORDER — INSULIN GLARGINE 100 UNIT/ML ~~LOC~~ SOLN
8.0000 [IU] | Freq: Every day | SUBCUTANEOUS | Status: DC
  Administered 2017-04-17: 8 [IU] via SUBCUTANEOUS
  Filled 2017-04-17 (×2): qty 0.08

## 2017-04-17 MED ORDER — GABAPENTIN 100 MG PO CAPS
100.0000 mg | ORAL_CAPSULE | Freq: Every day | ORAL | Status: DC
  Administered 2017-04-18 – 2017-04-20 (×3): 100 mg via ORAL
  Filled 2017-04-17 (×3): qty 1

## 2017-04-17 MED ORDER — QUETIAPINE FUMARATE 25 MG PO TABS
12.5000 mg | ORAL_TABLET | Freq: Every day | ORAL | Status: DC
  Administered 2017-04-17 – 2017-04-19 (×3): 12.5 mg via ORAL
  Filled 2017-04-17 (×3): qty 1

## 2017-04-17 NOTE — Progress Notes (Signed)
ANTICOAGULATION CONSULT NOTE - follow up  Pharmacy Consult for warfarin Indication: atrial fibrillation  No Known Allergies  Patient Measurements: Height: _0  (170.2 cm) Weight: 142 lb (64.4 kg) IBW/kg (Calculated) : 61.6 Heparin Dosing Weight:   Vital Signs: Temp: 97.7 F (36.5 C) (07/07 0342) BP: 99/66 (07/07 0342) Pulse Rate: 58 (07/07 0342)  Labs:  Recent Labs  04/15/17 0446 04/16/17 0443 04/17/17 0457  HGB  --  9.2*  --   HCT  --  28.1*  --   PLT  --  138*  --   LABPROT 28.3* 33.5* 42.3*  INR 2.59 3.20 4.29*  CREATININE 2.80* 3.03* 3.52*    Estimated Creatinine Clearance: 13 mL/min (A) (by C-G formula based on SCr of 3.52 mg/dL (H)).   Medical History: Past Medical History:  Diagnosis Date  . Anxiety   . Arthritis   . Chronic combined systolic and diastolic CHF (congestive heart failure) ([place])    a. 01/2008 Echo ([facility]): EF > 55%, mod-sev LVH w/ grade 1 DD, biatrial enlargement, mild AS, trace MR/TR; b. EF 45-50%, GR2DD, mild to moderate aortic stenosis, mild aortic insufficieny, mild to moderate mitral regurgitation, mild tricuspid regurgitation, mild biatrial enlargement, and a small pericardial effusion  . CKD (chronic kidney disease), stage IV ([place])   . Closed patellar sleeve fracture of right knee    a. 01/2017 - conservatively managed.  Marland Kitchen COPD (chronic obstructive pulmonary disease) ([place])   . Coronary artery disease    a. s/p remote stenting of unknown vessel @ [facility].  . Depression   . Diabetes mellitus with complication ([place])   . DVT (deep venous thrombosis) ([facility])    a. in the setting of right patellar fracture on Coumadin  . Hyperlipidemia   . Hypertension   . Parkinson disease ([place])     Medications:  Prescriptions Prior to Admission  Medication Sig Dispense Refill Last Dose  . acetaminophen (TYLENOL) 325 MG tablet Take 650 mg by mouth every 6 (six) hours as needed for mild pain.   prn at prn  . atorvastatin (LIPITOR) 20 MG tablet Take 20 mg  by mouth daily.   Past Week at Unknown time  . blood glucose meter kit and supplies KIT Dispense based on patient and insurance preference. Use up to four times daily as directed. (FOR ICD-9 250.00, 250.01). 1 each 0 Past Week at Unknown time  . Carbidopa-Levodopa ER (SINEMET CR) 25-100 MG tablet controlled release Take 2 tablets by mouth 4 (four) times daily. 240 tablet 1 Past Week at Unknown time  . Coenzyme Q10 (COQ10) 50 MG CAPS Take 50 mg by mouth daily.   Past Week at Unknown time  . CRANBERRY PO Take 1 tablet by mouth daily.   Past Week at Unknown time  . escitalopram (LEXAPRO) 20 MG tablet Take 20 mg by mouth daily.   Past Week at Unknown time  . furosemide (LASIX) 20 MG tablet Take 1 tablet (20 mg total) by mouth daily. 30 tablet 2 Past Week at Unknown time  . gabapentin (NEURONTIN) 300 MG capsule Take 300 mg by mouth daily.    Past Week at Unknown time  . glucose blood (ACCU-CHEK AVIVA PLUS) test strip Use as instructed 100 each 12 Past Week at Unknown time  . hydrALAZINE (APRESOLINE) 25 MG tablet Take 1 tablet (25 mg total) by mouth 2 (two) times daily. 180 tablet 3 Past Week at Unknown time  . insulin glargine (LANTUS) 100 UNIT/ML injection Inject 40 Units into the  skin at bedtime.    Past Week at Unknown time  . insulin lispro (HUMALOG) 100 UNIT/ML injection Inject 6 Units into the skin 3 (three) times daily with meals.    Past Week at Unknown time  . isosorbide mononitrate (IMDUR) 60 MG 24 hr tablet Take 1 tablet (60 mg total) by mouth daily. 30 tablet 2 Past Week at Unknown time  . loratadine (CLARITIN) 10 MG tablet Take 5 mg by mouth daily as needed for allergies.    prn at prn  . LORazepam (ATIVAN) 1 MG tablet Take 1 tablet (1 mg total) by mouth 2 (two) times daily as needed for anxiety. 5 tablet 0 prn at prn  . metoprolol tartrate (LOPRESSOR) 50 MG tablet Take 1 tablet (50 mg total) by mouth 2 (two) times daily. 180 tablet 3 Past Week at Unknown time  . mirabegron ER (MYRBETRIQ) 25  MG TB24 tablet Take 25 mg by mouth daily.   Past Week at Unknown time  . nitroGLYCERIN (NITROSTAT) 0.4 MG SL tablet Place 0.4 mg under the tongue every 5 (five) minutes as needed for chest pain.   prn at prn  . Omega-3 Fatty Acids (FISH OIL) 1000 MG CAPS Take 1,000 mg by mouth daily.   Past Week at Unknown time  . polyethylene glycol (MIRALAX / GLYCOLAX) packet Take 17 g by mouth daily as needed for mild constipation.   prn at prn  . promethazine (PHENERGAN) 25 MG tablet Take 25 mg by mouth every 6 (six) hours as needed for nausea or vomiting.   prn at prn  . saccharomyces boulardii (FLORASTOR) 250 MG capsule Take 250 mg by mouth daily.   Past Week at Unknown time  . traMADol (ULTRAM) 50 MG tablet Take 1 tablet (50 mg total) by mouth daily as needed. (Patient taking differently: Take 50 mg by mouth daily as needed for moderate pain. ) 5 tablet 0 prn at prn  . warfarin (COUMADIN) 2 MG tablet Take 1 tablet (2 mg total) by mouth as directed. Every Sunday 30 tablet 0 Past Week at Unknown time  . warfarin (COUMADIN) 3 MG tablet Take 1 tablet (3 mg total) by mouth daily at 6 PM. Mon,tue,wed,thur,friday, sat 30 tablet 1 Past Week at Unknown time   Scheduled:  . amiodarone  400 mg Oral BID  . atorvastatin  20 mg Oral Daily  . Carbidopa-Levodopa ER  2 tablet Oral QID  . chlorhexidine  15 mL Mouth Rinse BID  . docusate sodium  100 mg Oral BID  . escitalopram  20 mg Oral Daily  . feeding supplement (ENSURE ENLIVE)  237 mL Oral TID BM  . gabapentin  300 mg Oral Daily  . hydrALAZINE  25 mg Oral BID  . insulin aspart  0-9 Units Subcutaneous TID WC  . insulin glargine  8 Units Subcutaneous QHS  . isosorbide mononitrate  30 mg Oral Daily  . mouth rinse  15 mL Mouth Rinse q12n4p  . metoprolol tartrate  100 mg Oral BID  . mirabegron ER  25 mg Oral Daily  . multivitamin  15 mL Oral Daily  . saccharomyces boulardii  250 mg Oral Daily  . scopolamine  1 patch Transdermal Q72H  . Warfarin - Pharmacist Dosing  Inpatient   Does not apply q1800    Assessment: Pharmacy consulted to dose and monitor warfarin therapy in this 75 year old patient chronically on warfarin for Afib.  Home dose: 3 mg daily except 2 mg on sundays.  7/5 INR 2.59 Warfarin 3 mg 7/6 INR 3.20   Dose Held 7/7 INR 4.29   Patient has been initiated on amiodarone  Goal of Therapy:  INR 2-3 Monitor platelets by anticoagulation protocol: Yes   Plan:  Will hold warfarin dose again today due to significant jump likely due to interaction with amiodarone.    Jasmain Ahlberg A 04/17/2017,9:38 AM

## 2017-04-17 NOTE — Progress Notes (Signed)
Weight this morning =160 not accurate, a huge jump from yesterday's weight of 142.

## 2017-04-17 NOTE — Progress Notes (Signed)
Patient Name: Molly Wolf Date of Encounter: 04/17/2017  Primary Cardiologist: End  Hospital Problem List     Active Problems:   Acute respiratory failure with hypoxia ([facility])     Subjective   Confused this morning. Remains SOB. In Afib with reasonably controlled ventricular rates. Renal function continues to climb, up to 3.5 this morning with potassium at 5.3. INR supra-therapeutic.   Inpatient Medications    Scheduled Meds: . amiodarone  400 mg Oral BID  . atorvastatin  20 mg Oral Daily  . Carbidopa-Levodopa ER  2 tablet Oral QID  . chlorhexidine  15 mL Mouth Rinse BID  . docusate sodium  100 mg Oral BID  . escitalopram  20 mg Oral Daily  . feeding supplement (ENSURE ENLIVE)  237 mL Oral TID BM  . gabapentin  300 mg Oral Daily  . hydrALAZINE  25 mg Oral BID  . insulin aspart  0-9 Units Subcutaneous TID WC  . insulin glargine  8 Units Subcutaneous QHS  . isosorbide mononitrate  30 mg Oral Daily  . mouth rinse  15 mL Mouth Rinse q12n4p  . metoprolol tartrate  100 mg Oral BID  . mirabegron ER  25 mg Oral Daily  . multivitamin  15 mL Oral Daily  . saccharomyces boulardii  250 mg Oral Daily  . scopolamine  1 patch Transdermal Q72H  . Warfarin - Pharmacist Dosing Inpatient   Does not apply q1800   Continuous Infusions: . sodium chloride     PRN Meds: acetaminophen **OR** acetaminophen, bisacodyl, loratadine, LORazepam, nitroGLYCERIN, ondansetron **OR** ondansetron (ZOFRAN) IV, polyethylene glycol, traMADol   Vital Signs    Vitals:   04/16/17 0855 04/16/17 1546 04/16/17 1920 04/17/17 0342  BP:  125/83 130/78 99/66  Pulse: 75 (!) 50 74 (!) 58  Resp:  18 18 18   Temp:  (!) 97.5 F (36.4 C) 97.7 F (36.5 C) 97.7 F (36.5 C)  TempSrc:      SpO2: 94% 99% 100% 93%  Weight:      Height:        Intake/Output Summary (Last 24 hours) at 04/17/17 1019 Last data filed at 04/17/17 0900  Gross per 24 hour  Intake              464 ml  Output                0 ml  Net               464 ml   Filed Weights   04/14/17 0225 04/15/17 0423 04/16/17 0500  Weight: 166 lb 1.6 oz (75.3 kg) 164 lb (74.4 kg) 142 lb (64.4 kg)    Physical Exam    GEN: Frail appearing, in no acute distress. Tremor noted.  HEENT: Grossly normal.  Neck: Supple, no JVD, carotid bruits, or masses. Cardiac: Irregularly irregular, II/VI systolic murmur, no rubs, or gallops. No clubbing, cyanosis, edema.  Radials/DP/PT 2+ and equal bilaterally.  Respiratory:  Diminished breath sounds bilaterally. GI: Soft, nontender, nondistended, BS + x 4. MS: no deformity or atrophy. Skin: warm and dry, no rash. Neuro:  Strength and sensation are intact. Psych: Alert. Confused.  Labs    CBC  Recent Labs  04/16/17 0443  WBC 4.4  HGB 9.2*  HCT 28.1*  MCV 86.3  PLT 138*   Basic Metabolic Panel  Recent Labs  04/16/17 0443 04/17/17 0457  NA 142 141  K 5.0 5.3*  CL 105 105  CO2 30 28  GLUCOSE 91 113*  BUN 55* 62*  CREATININE 3.03* 3.52*  CALCIUM 9.0 8.6*   Liver Function Tests No results for input(s): AST, ALT, ALKPHOS, BILITOT, PROT, ALBUMIN in the last 72 hours. No results for input(s): LIPASE, AMYLASE in the last 72 hours. Cardiac Enzymes No results for input(s): CKTOTAL, CKMB, CKMBINDEX, TROPONINI in the last 72 hours. BNP Invalid input(s): POCBNP D-Dimer No results for input(s): DDIMER in the last 72 hours. Hemoglobin A1C No results for input(s): HGBA1C in the last 72 hours. Fasting Lipid Panel No results for input(s): CHOL, HDL, LDLCALC, TRIG, CHOLHDL, LDLDIRECT in the last 72 hours. Thyroid Function Tests No results for input(s): TSH, T4TOTAL, T3FREE, THYROIDAB in the last 72 hours.  Invalid input(s): FREET3  Telemetry    Afib, 80s bpm - Personally Reviewed  ECG    n/a - Personally Reviewed  Radiology    No results found.  Cardiac Studies   TTE 04/04/17: Study Conclusions  - Left ventricle: LVEF appears depressed at approximately 35% with severe  hypokinesis of mid/distal anterior and anterolateral walls. Moderate LVH Atrial fibrillation makes evaluation of LVEF difficult In comparison to echo from April 2018 these findings are more prominent and LVEF is worse.t, But in that study (April 2018) apical windows were foreshortened making some of comparison diffcult - Aortic valve: AV is thickened, calcified with mildly restricted motion. There was mild regurgitation. - Mitral valve: There was moderate regurgitation. - Left atrium: The atrium was moderately dilated. - Right atrium: The atrium was mildly dilated. - Pulmonary arteries: PA peak pressure: 65 mm Hg (S). - Pericardium, extracardiac: A small pericardial effusion was identified.  02/10/17 Long term monitor (14 days) - Predominant NSR with average rate 74 bpm (range 56-103 bpm). Patient triggered event corresponds to sinus rhythm. Occasional PACs; frequent PVCs. Two episodes nonsustained ventricular tachycardia lasting up to 5 beats. 368 episodes of SVT with max rate 182 bpm and longest episode 15 min, 35 sec (average rate 102 bpm). Irregularity during some episodes raises possibility of atrial fibrillation and/or atrial flutter with variable block. There numerous episodes of atrial tachycardia, possibly Afib vs atrial flutter with variable AV block vs sustained SVT. Most suggestive of SVT but atrial flutter and/or A fib cannot be excluded.  01/26/2017 Echo  -LV wall thickness was increased in pattern of mild LVH with moderate focal basal hypertrophy of septum. Systolic function mildly reduced with EF estimated 45-50%. Akinesis of basal mid inferolateral and inferior myocardium. Features consistent with grade 2 diastolic dysfunction. Aortic valve moderately thickened, mildly calcified leaflets. Cusp separation reduced. Mild to moderate stenosis, mild regurgitation. Mitral valve: mild to moderate regurgitation. Left atrium: mildly dilated. Right ventricle: mildly  dilated.   01/26/2017 US venous Img lower bilateral 1. Occlusive thrombus of the right peroneal vein in the calf. No evidence of additional thrombus of the bilateral lower extremities. 2. Left popliteal fossa complex cyst, probably a Baker cyst.  01/2008 Echo ([facility]): EF >55%, mod-sev LVH w/ grade 1 DD, biatrial enlargement, mild AS, trace MR/TR; b. EF 45-50%, GR2DD, mild to moderate aortic stenosis, mild aortic insufficieny, mild to moderate mitral regurgitation, mild tricuspid regurgitation, mild biatrial enlargement, and a small pericardial effusion.  Patient Profile     76 y.o. female who is being seen today for the evaluation of syncope and hypoxiaat the request of Dr. Letitia Libra, MD. Patient has a h/o CAD s/p remote PCI at [facility], persistent atrial fibrillation diagnosed 03/31/17 on Coumadin, chronic combined systolic and diastolic HF complicated by pulmonary HTN,  CKD IV, Parkinson's disease, DM, COPD, DVT in setting of R patellar fracture on Coumadin, HTN, HLD, and anxiety. She presents s/phypoxic episode, syncope, and unresponsiveness / confusion [facility] d/t CHF exacerbation with active GI bleed and anemia. Of note, GI workup and endoscopy were clear at the time of 6/29 discharge.   Assessment & Plan    1. Acute respiratory failure with hypoxia. - 6/29 CXRnot consistent with acute pulmonary edema (recent admission for CHF exacerbation). There is an area of focal opacity in left lung base as well as right middle lobe that likely represents pneumonia. - Previous CT and CXR as above. - Likely exacerbation 2/2 pneumonia and chronic anemia complicated by poorly controlled Afib with RVR. - 04/12/17 Ddimer to r/o PE in setting of DVT with recent sub-therapeutic INR during last admission with recent GI bleed and need for EGD(currently on Warfarin prophylaxis s/p thrombus as above).  - Ddimer value 867.15, consider imaging, defer to IM - Likely  multifactorial including PNA, worsening anemia, Afib with RVR, and acute on chronic combined CHF - Stable anemia - Current Osat 95%. Remains on 2L [place].  2. Persistent Afib: - Continue amiodarone 400mg  BID x 1 week, 200 mg bid x 1 week, then 200 mg daily thereafter  - Rates continue to be well controlled - On metoprolol 100mg  PO BID - CHADS2VASc at least 6 (CHF, HTN, age x 2, DM, female) - Continue warfarin for anticoagulation per pharmacy - Not a good candidate for digoxin given advanced age and renal function - Not a good candidate for CCB given CM - Family does not seem to be interested in cardioversion.  - INR 4.29, Coumadin on hold 7/7  3. Acute on chronic combined CHF: - No observed pulmonary edema on 6/29 CXR - Ejection fraction 35% on echocardiogram 04/04/2017 with wall motion abnormality - Not on ACE/ARB/Entresto/spiro given CKD stage IV - Continue to monitor renal function closely - Lasix discontinued given AKI - Continue Imdur/hydralazine  - Chronic anemia likely contributing to shortness of breath symptoms. Most recent CBC results as above. - Monitoring basic metabolic panel daily  4. DM2 - Glucose remains elevated at 205 (BMP, 7/5) - Continue basal insulin injection adjusted for hospital diet. Sliding scale insulin as well. - Per IM  5. Acute on CKD stage IV: - Avoid nephrotoxic agents - Elevated creatinine at 3.52 (7/7) - Continue to monitor renal function with labs - Lasix discontinued - Would ideally like to avoid IV fluids - Ordered mouth care instructions to assist with issue of dry mouth  6. Acute on chronic anemia of chronic disease: - Ordered Iron /TIBC & ferritin studies to reassess anemia - Maintain HGB > 8.5 - Recommend CBC  7. DVT prophylaxis:  - 01/26/2017 US venous Img lower bilateral with occlusive thrombus of the right peroneal vein in the calf. No evidence of additional thrombus of the bilateral lower extremities. - Anticoagulation on  warfarin - 7/2 ddimer results 867.15   8. Hypertension:  - Soft this morning - Decrease hydralazine - Continue Imdur and Lopressor   9. CAD:  - Stable - Continue Imdur/Lopressor/Lipitor 20mg /Coumadin in place of ASA - Continue hydralazine and nitro - Given her recent drop in EF as above along with her symptoms, consider ischemic evaluation via nuclear stress testing prior to discharge if confusion decreases and improved awareness  10. Parkinson's:  - Continue Sinemet, pramipexole and scopolamine - Per IM - Cannot rule out some component of  dysautonomia  - If the above workup is completely unrevealing, consider neuro eval  11. COPD - Managed per IM  12. Confusion: -Scopolamine patch discontinued by IM  13. Hyperkalemia: -Lantus decreased 2/2 low blood sugars -Per IM  High risk of readmission given her frail state  Signed, Carola FrostRyan Dunn, PA-C [facility] Pager: [phone] 04/17/2017, 10:19 AM   I have personally seen and examined this patient with Eula Listenyan Dunn, PA-C. I agree with the assessment and plan as outlined above. She has a history of CAD, atrial fib, chronic syst/diast CHF, pulm HTN, COPD, CKD and is admitted with respiratory failure/hypoxia. She does not appear to grossly volume overloaded. Lasix on hold given renal issues. Atrial fib is rate controlled. No chest pain. LVEF has dropped. Will need ischemic evaluation once acute issues resolved. D-dimer elevated but not a good candidate for CTA to exclude PE. She is on coumadin with therapeutic INR.   Verne CarrowChristopher McAlhany 04/17/2017 11:21 AM

## 2017-04-17 NOTE — Progress Notes (Signed)
PT Cancellation Note  Patient Details Name: Renella CunasJudy Follmer MRN: [id number] DOB: April 03, 1941   Cancelled Treatment:    Reason Eval/Treat Not Completed: Fatigue/lethargy limiting ability to participate. Initial attempt pt with nursing care; nursing requests therapy to return. Second attempt a short while later, pt does not respond to voice or touch to awaken at all to participate. Re attempt at a later date.    Scot DockHeidi E Barnes, PTA 04/17/2017, 12:12 PM

## 2017-04-17 NOTE — Progress Notes (Signed)
Coumadin level=4.29 this morning. Dr. Tobi BastosPyreddy notified.  Coumadin on hold per Dr. Mosetta PuttPyreddy's order. Will continue to monitor.

## 2017-04-17 NOTE — Progress Notes (Addendum)
Patient having multiple loose brown stools. MD notified, stool collected and sent to lab. Skin cleansed, protective barrier cream and sacrum foam pad applied.

## 2017-04-17 NOTE — Progress Notes (Signed)
Patient ID: Molly Wolf, female   DOB: 1941/09/05, 76 y.o.   MRN: [id number]  [employer] PROGRESS NOTE  Molly Wolf WUJ:[id number] DOB: July 20, 1941 DOA: 04/09/2017 PCP: Darliss Ridgel, MD  HPI/Subjective: Patient does answer some questions but some confusion as per nursing staff and cardiologist. Patient states her left breast is swollen  Objective: Vitals:   04/17/17 0342 04/17/17 1100  BP: 99/66 (!) 103/56  Pulse: (!) 58 61  Resp: 18   Temp: 97.7 F (36.5 C)     Filed Weights   04/14/17 0225 04/15/17 0423 04/16/17 0500  Weight: 75.3 kg (166 lb 1.6 oz) 74.4 kg (164 lb) 64.4 kg (142 lb)    ROS: Review of Systems  Constitutional: Negative for chills and fever.  Eyes: Negative for blurred vision.  Respiratory: Negative for cough and shortness of breath.   Cardiovascular: Negative for chest pain.  Gastrointestinal: Negative for abdominal pain, constipation, diarrhea, nausea and vomiting.  Genitourinary: Negative for dysuria.  Musculoskeletal: Negative for joint pain.  Neurological: Negative for dizziness and headaches.   Exam: Physical Exam  HENT:  Nose: No mucosal edema.  Mouth/Throat: No oropharyngeal exudate or posterior oropharyngeal edema.  Eyes: Conjunctivae, EOM and lids are normal. Pupils are equal, round, and reactive to light.  Neck: No JVD present. Carotid bruit is not present. No edema present. No thyroid mass and no thyromegaly present.  Cardiovascular: S1 normal and S2 normal.  Exam reveals no gallop.   No murmur heard. Pulses:      Dorsalis pedis pulses are 2+ on the right side, and 2+ on the left side.  Respiratory: No respiratory distress. She has no wheezes. She has no rhonchi. She has no rales.  GI: Soft. Bowel sounds are normal. There is no tenderness.  Genitourinary: No breast swelling, tenderness, discharge or bleeding.  Genitourinary Comments: Breast exam done with nurse at the bedside  Musculoskeletal:       Right ankle: She exhibits no  swelling.       Left ankle: She exhibits no swelling.  Lymphadenopathy:    She has no cervical adenopathy.  Neurological: She is alert. No cranial nerve deficit.  Skin: Skin is warm. No rash noted. Nails show no clubbing.  Psychiatric: She has a normal mood and affect.      Data Reviewed: Basic Metabolic Panel:  Recent Labs Lab 04/12/17 0959 04/14/17 0456 04/15/17 0446 04/16/17 0443 04/17/17 0457  NA 138 139 139 142 141  K 4.7 4.5 4.8 5.0 5.3*  CL 106 104 104 105 105  CO2 25 27 28 30 28   GLUCOSE 171* 181* 205* 91 113*  BUN 45* 47* 50* 55* 62*  CREATININE 2.64* 2.71* 2.80* 3.03* 3.52*  CALCIUM 8.7* 9.0 9.0 9.0 8.6*   CBC:  Recent Labs Lab 04/12/17 0959 04/14/17 0456 04/16/17 0443  WBC 5.0 5.0 4.4  HGB 9.2* 9.3* 9.2*  HCT 27.6* 27.9* 28.1*  MCV 84.7 85.7 86.3  PLT 148* 142* 138*   BNP (last 3 results)  Recent Labs  01/26/17 1112 04/03/17 1918 04/05/17 0453  BNP 2,049.0* 1,139.0* 980.0*     CBG:  Recent Labs Lab 04/16/17 1626 04/16/17 2041 04/17/17 0451 04/17/17 0808 04/17/17 1127  GLUCAP 89 85 99 69 106*    Recent Results (from the past 240 hour(s))  Culture, blood (routine x 2) Call MD if unable to obtain prior to antibiotics being given     Status: None   Collection Time: 04/10/17  5:18 AM  Result Value Ref  Range Status   Specimen Description BLOOD RIGHT HAND  Final   Special Requests   Final    BOTTLES DRAWN AEROBIC AND ANAEROBIC Blood Culture adequate volume   Culture NO GROWTH 5 DAYS  Final   Report Status 04/15/2017 FINAL  Final  Culture, blood (routine x 2) Call MD if unable to obtain prior to antibiotics being given     Status: None   Collection Time: 04/10/17  5:18 AM  Result Value Ref Range Status   Specimen Description BLOOD LEFT ANTECUBITAL  Final   Special Requests   Final    BOTTLES DRAWN AEROBIC AND ANAEROBIC Blood Culture results may not be optimal due to an excessive volume of blood received in culture bottles   Culture  NO GROWTH 5 DAYS  Final   Report Status 04/15/2017 FINAL  Final  MRSA PCR Screening     Status: None   Collection Time: 04/10/17  1:47 PM  Result Value Ref Range Status   MRSA by PCR NEGATIVE NEGATIVE Final    Comment:        The GeneXpert MRSA Assay (FDA approved for NASAL specimens only), is one component of a comprehensive MRSA colonization surveillance program. It is not intended to diagnose MRSA infection nor to guide or monitor treatment for MRSA infections.      Scheduled Meds: . amiodarone  400 mg Oral BID  . atorvastatin  20 mg Oral Daily  . Carbidopa-Levodopa ER  2 tablet Oral QID  . chlorhexidine  15 mL Mouth Rinse BID  . docusate sodium  100 mg Oral BID  . escitalopram  20 mg Oral Daily  . feeding supplement (ENSURE ENLIVE)  237 mL Oral TID BM  . [START ON 04/18/2017] gabapentin  100 mg Oral Daily  . hydrALAZINE  10 mg Oral BID  . insulin aspart  0-9 Units Subcutaneous TID WC  . insulin glargine  8 Units Subcutaneous QHS  . isosorbide mononitrate  30 mg Oral Daily  . mouth rinse  15 mL Mouth Rinse q12n4p  . metoprolol tartrate  100 mg Oral BID  . multivitamin  15 mL Oral Daily  . QUEtiapine  12.5 mg Oral QHS  . saccharomyces boulardii  250 mg Oral Daily  . Warfarin - Pharmacist Dosing Inpatient   Does not apply q1800   Continuous Infusions: . sodium chloride    . sodium chloride 500 mL (04/17/17 1100)    Assessment/Plan:  1. Acute respiratory failure. Continue oxygen supplementation. The patient has been over diuresed at this point.  2. acute encephalopathy. Stop scopolamine patch and myrbitrec. Start Seroquel at night so she sleeps 3. Pneumonia. Finish antibiotics 4. Acute kidney injury on chronic kidney disease stage IV. Hold diuretics. Fluid bolus today 5. History of Parkinson's on Sinemet 6. Type 2 diabetes mellitus on glargine insulin and sliding scale 7. Acute on chronic systolic congestive heart failure. Holding Lasix with overdiuresis. Continue  metoprolol. No ACE inhibitor or arm secondary to acute kidney injury. 8. Atrial fibrillation with rapid ventricular rate on metoprolol and amiodarone at this point with better control of heart rate 9. Chronic anemia 10. Hyperlipidemia unspecified on atorvastatin 11. History of DVT on Coumadin. Coumadin level on hold secondary to high INR  Code Status:     Code Status Orders        Start     Ordered   04/10/17 0328  Full code  Continuous     04/10/17 0327    Code Status History  Date Active Date Inactive Code Status Order ID Comments User Context   04/03/2017 11:30 PM 04/09/2017 10:09 PM Full Code [id number]  Hugelmeyer, Jon GillsAlexis, DO ED   02/15/2017  6:16 PM 02/16/2017  6:18 PM Full Code [id number]  Altamese DillingVachhani, Vaibhavkumar, MD Inpatient   01/26/2017  2:44 PM 01/29/2017  9:47 PM Full Code [id number]  Alford HighlandWieting, Trevante Tennell, MD ED   12/28/2016  7:44 AM 12/29/2016  4:34 PM DNR [id number]  Delfino LovettShah, Vipul, MD Inpatient   12/27/2016 11:06 PM 12/28/2016  7:44 AM Full Code [id number]  Hugelmeyer, Jon GillsAlexis, DO Inpatient    Advance Directive Documentation     Most Recent Value  Type of Advance Directive  Healthcare Power of Attorney  Pre-existing out of facility DNR order (yellow form or pink MOST form)  -  "MOST" Form in Place?  -     Family Communication: Daughter and family at bedside Disposition Plan: TBD  Time spent: 28 minutes  Alford HighlandWIETING, Marquise Lambson  [employer]

## 2017-04-18 DIAGNOSIS — I5043 Acute on chronic combined systolic (congestive) and diastolic (congestive) heart failure: Secondary | ICD-10-CM

## 2017-04-18 LAB — BASIC METABOLIC PANEL
Anion gap: 8 (ref 5–15)
Anion gap: 9 (ref 5–15)
BUN: 72 mg/dL — AB (ref 6–20)
BUN: 74 mg/dL — AB (ref 6–20)
CALCIUM: 8.6 mg/dL — AB (ref 8.9–10.3)
CALCIUM: 8.9 mg/dL (ref 8.9–10.3)
CHLORIDE: 104 mmol/L (ref 101–111)
CHLORIDE: 104 mmol/L (ref 101–111)
CO2: 28 mmol/L (ref 22–32)
CO2: 27 mmol/L (ref 22–32)
CREATININE: 3.84 mg/dL — AB (ref 0.44–1.00)
CREATININE: 4.11 mg/dL — AB (ref 0.44–1.00)
GFR calc non Af Amer: 10 mL/min — ABNORMAL LOW (ref >60)
GFR, EST AFRICAN AMERICAN: 12 mL/min — AB (ref >60)
GFR, EST AFRICAN AMERICAN: 11 mL/min — AB (ref >60)
GFR, EST NON AFRICAN AMERICAN: 10 mL/min — AB (ref >60)
Glucose, Bld: 174 mg/dL — ABNORMAL HIGH (ref 65–99)
Glucose, Bld: 171 mg/dL — ABNORMAL HIGH (ref 65–99)
Potassium: 5.7 mmol/L — ABNORMAL HIGH (ref 3.5–5.1)
Potassium: 5.3 mmol/L — ABNORMAL HIGH (ref 3.5–5.1)
SODIUM: 140 mmol/L (ref 135–145)
SODIUM: 140 mmol/L (ref 135–145)

## 2017-04-18 LAB — CBC
HCT: 30.3 % — ABNORMAL LOW (ref 35.0–47.0)
HEMOGLOBIN: 9.9 g/dL — AB (ref 12.0–16.0)
MCH: 28.3 pg (ref 26.0–34.0)
MCHC: 32.8 g/dL (ref 32.0–36.0)
MCV: 86.2 fL (ref 80.0–100.0)
Platelets: 150 10*3/uL (ref 150–440)
RBC: 3.52 MIL/uL — AB (ref 3.80–5.20)
RDW: 16.5 % — ABNORMAL HIGH (ref 11.5–14.5)
WBC: 6.1 10*3/uL (ref 3.6–11.0)

## 2017-04-18 LAB — GLUCOSE, CAPILLARY
Glucose-Capillary: 153 mg/dL — ABNORMAL HIGH (ref 65–99)
Glucose-Capillary: 128 mg/dL — ABNORMAL HIGH (ref 65–99)
Glucose-Capillary: 161 mg/dL — ABNORMAL HIGH (ref 65–99)
Glucose-Capillary: 130 mg/dL — ABNORMAL HIGH (ref 65–99)
Glucose-Capillary: 110 mg/dL — ABNORMAL HIGH (ref 65–99)

## 2017-04-18 LAB — PROTIME-INR
INR: 4.98 — AB
PROTHROMBIN TIME: 47.7 s — AB (ref 11.4–15.2)

## 2017-04-18 MED ORDER — SODIUM POLYSTYRENE SULFONATE 15 GM/60ML PO SUSP
15.0000 g | Freq: Once | ORAL | Status: AC
  Administered 2017-04-18: 15 g via ORAL
  Filled 2017-04-18: qty 60

## 2017-04-18 MED ORDER — INSULIN REGULAR HUMAN 100 UNIT/ML IJ SOLN
10.0000 [IU] | Freq: Once | INTRAMUSCULAR | Status: AC
Start: 2017-04-18 — End: 2017-04-18
  Administered 2017-04-18: 10 [IU] via INTRAVENOUS
  Filled 2017-04-18: qty 0.1

## 2017-04-18 MED ORDER — INSULIN GLARGINE 100 UNIT/ML ~~LOC~~ SOLN
5.0000 [IU] | Freq: Every day | SUBCUTANEOUS | Status: DC
  Administered 2017-04-18 – 2017-04-25 (×7): 5 [IU] via SUBCUTANEOUS
  Filled 2017-04-18 (×10): qty 0.05

## 2017-04-18 MED ORDER — SODIUM BICARBONATE 8.4 % IV SOLN
50.0000 meq | Freq: Once | INTRAVENOUS | Status: AC
Start: 2017-04-18 — End: 2017-04-18
  Administered 2017-04-18: 50 meq via INTRAVENOUS
  Filled 2017-04-18: qty 50

## 2017-04-18 MED ORDER — CARBIDOPA-LEVODOPA ER 50-200 MG PO TBCR
1.0000 | EXTENDED_RELEASE_TABLET | Freq: Four times a day (QID) | ORAL | Status: DC
  Administered 2017-04-18 – 2017-04-26 (×27): 1 via ORAL
  Filled 2017-04-18 (×34): qty 1

## 2017-04-18 MED ORDER — SODIUM CHLORIDE 0.9 % IV SOLN
1.0000 g | Freq: Once | INTRAVENOUS | Status: AC
  Administered 2017-04-18: 1 g via INTRAVENOUS
  Filled 2017-04-18: qty 10

## 2017-04-18 MED ORDER — DEXTROSE 250 MG/ML IV SOLN: 25.0000 g | Freq: Once | INTRAVENOUS | Status: DC

## 2017-04-18 MED ORDER — DEXTROSE 50 % IV SOLN
25.0000 g | Freq: Once | INTRAVENOUS | Status: AC
  Administered 2017-04-18: 25 g via INTRAVENOUS
  Filled 2017-04-18: qty 50

## 2017-04-18 NOTE — Consult Note (Signed)
[facility]  CONSULT NOTE    Date: 04/18/2017                  Patient Name:  Molly Wolf  MRN: [id number]  DOB: 1940/10/21  Age / Sex: 76 y.o., female         PCP: Suann Larry, MD                 Service Requesting Consult: Dr. Leslye Peer                 Reason for Consult: Acute renal failure on chronic kidney disease stage IV with hyperkalemia            History of Present Illness: [name] is a 76 y.o. white female with coronary artery disease, congestive heart failure, pulmonary hypertension, parkinson's, diabetes mellitus type II, COPD, DVT on warfarin, atrial fibrillation, hyperlipidemia, anemia, anxiety, patellar fracture, who was admitted to [facility] on 04/09/2017 for Nonresponsive   Patient was hospitalized at [facility] from 6/23 to 6/29 for GIB and congestive heart failure. She was brought back due to being unresponsive and altered mental status.   Patient's creatinine was at 2.5 during that admission with normal range potassium. However this week patient's potassium and creatinine have been rising. There is concern for overdiuresis. Patient was treated with calcium gluconate and kayexalate this morning. Nephrology consulted.   Patient unable to give much of a history.   Medications: Outpatient medications: Prescriptions Prior to Admission  Medication Sig Dispense Refill Last Dose  . acetaminophen (TYLENOL) 325 MG tablet Take 650 mg by mouth every 6 (six) hours as needed for mild pain.   prn at prn  . atorvastatin (LIPITOR) 20 MG tablet Take 20 mg by mouth daily.   Past Week at Unknown time  . blood glucose meter kit and supplies KIT Dispense based on patient and insurance preference. Use up to four times daily as directed. (FOR ICD-9 250.00, 250.01). 1 each 0 Past Week at Unknown time  . Carbidopa-Levodopa ER (SINEMET CR) 25-100 MG tablet controlled release Take 2 tablets by mouth 4 (four) times daily. 240 tablet 1 Past Week at Unknown time  .  Coenzyme Q10 (COQ10) 50 MG CAPS Take 50 mg by mouth daily.   Past Week at Unknown time  . CRANBERRY PO Take 1 tablet by mouth daily.   Past Week at Unknown time  . escitalopram (LEXAPRO) 20 MG tablet Take 20 mg by mouth daily.   Past Week at Unknown time  . furosemide (LASIX) 20 MG tablet Take 1 tablet (20 mg total) by mouth daily. 30 tablet 2 Past Week at Unknown time  . gabapentin (NEURONTIN) 300 MG capsule Take 300 mg by mouth daily.    Past Week at Unknown time  . glucose blood (ACCU-CHEK AVIVA PLUS) test strip Use as instructed 100 each 12 Past Week at Unknown time  . hydrALAZINE (APRESOLINE) 25 MG tablet Take 1 tablet (25 mg total) by mouth 2 (two) times daily. 180 tablet 3 Past Week at Unknown time  . insulin glargine (LANTUS) 100 UNIT/ML injection Inject 40 Units into the skin at bedtime.    Past Week at Unknown time  . insulin lispro (HUMALOG) 100 UNIT/ML injection Inject 6 Units into the skin 3 (three) times daily with meals.    Past Week at Unknown time  . isosorbide mononitrate (IMDUR) 60 MG 24 hr tablet Take 1 tablet (60 mg total) by mouth daily. 30 tablet 2  Past Week at Unknown time  . loratadine (CLARITIN) 10 MG tablet Take 5 mg by mouth daily as needed for allergies.    prn at prn  . LORazepam (ATIVAN) 1 MG tablet Take 1 tablet (1 mg total) by mouth 2 (two) times daily as needed for anxiety. 5 tablet 0 prn at prn  . metoprolol tartrate (LOPRESSOR) 50 MG tablet Take 1 tablet (50 mg total) by mouth 2 (two) times daily. 180 tablet 3 Past Week at Unknown time  . mirabegron ER (MYRBETRIQ) 25 MG TB24 tablet Take 25 mg by mouth daily.   Past Week at Unknown time  . nitroGLYCERIN (NITROSTAT) 0.4 MG SL tablet Place 0.4 mg under the tongue every 5 (five) minutes as needed for chest pain.   prn at prn  . Omega-3 Fatty Acids (FISH OIL) 1000 MG CAPS Take 1,000 mg by mouth daily.   Past Week at Unknown time  . polyethylene glycol (MIRALAX / GLYCOLAX) packet Take 17 g by mouth daily as needed for  mild constipation.   prn at prn  . promethazine (PHENERGAN) 25 MG tablet Take 25 mg by mouth every 6 (six) hours as needed for nausea or vomiting.   prn at prn  . saccharomyces boulardii (FLORASTOR) 250 MG capsule Take 250 mg by mouth daily.   Past Week at Unknown time  . traMADol (ULTRAM) 50 MG tablet Take 1 tablet (50 mg total) by mouth daily as needed. (Patient taking differently: Take 50 mg by mouth daily as needed for moderate pain. ) 5 tablet 0 prn at prn  . warfarin (COUMADIN) 2 MG tablet Take 1 tablet (2 mg total) by mouth as directed. Every Sunday 30 tablet 0 Past Week at Unknown time  . warfarin (COUMADIN) 3 MG tablet Take 1 tablet (3 mg total) by mouth daily at 6 PM. Mon,tue,wed,thur,friday, sat 30 tablet 1 Past Week at Unknown time    Current medications: Current Facility-Administered Medications  Medication Dose Route Frequency Provider Last Rate Last Dose  . acetaminophen (TYLENOL) tablet 650 mg  650 mg Oral Q6H PRN Harrie Foreman, MD       Or  . acetaminophen (TYLENOL) suppository 650 mg  650 mg Rectal Q6H PRN Harrie Foreman, MD      . amiodarone (PACERONE) tablet 400 mg  400 mg Oral BID Minna Merritts, MD   400 mg at 04/18/17 0955  . atorvastatin (LIPITOR) tablet 20 mg  20 mg Oral Daily Harrie Foreman, MD   20 mg at 04/18/17 0954  . bisacodyl (DULCOLAX) EC tablet 5 mg  5 mg Oral Daily PRN Baxter Hire, MD      . carbidopa-levodopa (SINEMET CR) 50-200 MG per tablet controlled release 1 tablet  1 tablet Oral QID Loletha Grayer, MD   1 tablet at 04/18/17 1040  . chlorhexidine (PERIDEX) 0.12 % solution 15 mL  15 mL Mouth Rinse BID Baxter Hire, MD   15 mL at 04/18/17 0954  . docusate sodium (COLACE) capsule 100 mg  100 mg Oral BID Harrie Foreman, MD   100 mg at 04/18/17 0955  . escitalopram (LEXAPRO) tablet 20 mg  20 mg Oral Daily Harrie Foreman, MD   20 mg at 04/18/17 0956  . feeding supplement (ENSURE ENLIVE) (ENSURE ENLIVE) liquid 237 mL  237 mL  Oral TID BM Baxter Hire, MD   237 mL at 04/18/17 0958  . gabapentin (NEURONTIN) capsule 100 mg  100 mg Oral Daily Wieting,  Richard, MD   100 mg at 04/18/17 0954  . hydrALAZINE (APRESOLINE) tablet 10 mg  10 mg Oral BID Christell Faith M, PA-C   10 mg at 04/18/17 6301  . insulin aspart (novoLOG) injection 0-9 Units  0-9 Units Subcutaneous TID WC Harrie Foreman, MD   1 Units at 04/15/17 1821  . insulin glargine (LANTUS) injection 5 Units  5 Units Subcutaneous QHS Wieting, Richard, MD      . isosorbide mononitrate (IMDUR) 24 hr tablet 30 mg  30 mg Oral Daily Christell Faith M, PA-C   30 mg at 04/18/17 0955  . loratadine (CLARITIN) tablet 5 mg  5 mg Oral Daily PRN Harrie Foreman, MD      . LORazepam (ATIVAN) tablet 1 mg  1 mg Oral BID PRN Harrie Foreman, MD   1 mg at 04/16/17 6010  . MEDLINE mouth rinse  15 mL Mouth Rinse q12n4p Baxter Hire, MD   15 mL at 04/18/17 0956  . metoprolol tartrate (LOPRESSOR) tablet 100 mg  100 mg Oral BID Baxter Hire, MD   100 mg at 04/18/17 0955  . multivitamin liquid 15 mL  15 mL Oral Daily Harrel Lemon D, MD   15 mL at 04/18/17 0956  . nitroGLYCERIN (NITROSTAT) SL tablet 0.4 mg  0.4 mg Sublingual Q5 min PRN Harrie Foreman, MD      . ondansetron [facility]) tablet 4 mg  4 mg Oral Q6H PRN Harrie Foreman, MD       Or  . ondansetron [facility]) injection 4 mg  4 mg Intravenous Q6H PRN Harrie Foreman, MD   4 mg at 04/15/17 0002  . polyethylene glycol (MIRALAX / GLYCOLAX) packet 17 g  17 g Oral Daily PRN Harrie Foreman, MD   17 g at 04/16/17 9323  . QUEtiapine (SEROQUEL) tablet 12.5 mg  12.5 mg Oral QHS Loletha Grayer, MD   12.5 mg at 04/17/17 2129  . saccharomyces boulardii (FLORASTOR) capsule 250 mg  250 mg Oral Daily Harrie Foreman, MD   250 mg at 04/18/17 0954  . sodium chloride 0.9 % bolus 500 mL  500 mL Intravenous Once Harrie Foreman, MD      . traMADol Veatrice Bourbon) tablet 50 mg  50 mg Oral Daily PRN Harrie Foreman, MD   50 mg at  04/16/17 5573  . Warfarin - Pharmacist Dosing Inpatient   Does not apply q1800 Larene Beach, [facility]   Stopped at 04/16/17 1800      Allergies: No Known Allergies    Past Medical History: Past Medical History:  Diagnosis Date  . Anxiety   . Arthritis   . Chronic combined systolic and diastolic CHF (congestive heart failure) ([place])    a. 01/2008 Echo ([facility]): EF > 55%, mod-sev LVH w/ grade 1 DD, biatrial enlargement, mild AS, trace MR/TR; b. EF 45-50%, GR2DD, mild to moderate aortic stenosis, mild aortic insufficieny, mild to moderate mitral regurgitation, mild tricuspid regurgitation, mild biatrial enlargement, and a small pericardial effusion  . CKD (chronic kidney disease), stage IV ([place])   . Closed patellar sleeve fracture of right knee    a. 01/2017 - conservatively managed.  Marland Kitchen COPD (chronic obstructive pulmonary disease) ([place])   . Coronary artery disease    a. s/p remote stenting of unknown vessel @ [facility].  . Depression   . Diabetes mellitus with complication ([place])   . DVT (deep venous thrombosis) ([place])    a. in the setting  of right patellar fracture on Coumadin  . Hyperlipidemia   . Hypertension   . Parkinson disease [facility])      Past Surgical History: Past Surgical History:  Procedure Laterality Date  . BACK SURGERY    . CORONARY ANGIOPLASTY WITH STENT PLACEMENT    . ESOPHAGOGASTRODUODENOSCOPY N/A 04/09/2017   Procedure: ESOPHAGOGASTRODUODENOSCOPY (EGD);  Surgeon: Jonathon Bellows, MD;  Location[facility] ENDOSCOPY;  Service: Endoscopy;  Laterality: N/A;  . NECK SURGERY       Family History: Family History  Problem Relation Age of Onset  . Cervical cancer Mother   . Kidney failure Father   . CAD Father   . CAD Brother   . Prostate cancer Neg Hx   . Kidney cancer Neg Hx   . Bladder Cancer Neg Hx      Social History: Social History   Social History  . Marital status: Single    Spouse name: N/A  . Number of children: N/A  . Years of education: N/A   Occupational  History  . Not on file.   Social History Main Topics  . Smoking status: Never Smoker  . Smokeless tobacco: Never Used  . Alcohol use No  . Drug use: No  . Sexual activity: No   Other Topics Concern  . Not on file   Social History Narrative  . No narrative on file     Review of Systems: Review of Systems  Unable to perform ROS: Dementia    Vital Signs: Blood pressure (!) 142/81, pulse 80, temperature 97.7 F (36.5 C), temperature source Oral, resp. rate 18, height 5' 7"  (1.702 m), weight 74.8 kg (165 lb), SpO2 100 %.  Weight trends: Filed Weights   04/15/17 0423 04/16/17 0500 04/18/17 0335  Weight: 74.4 kg (164 lb) 64.4 kg (142 lb) 74.8 kg (165 lb)    Physical Exam: General: NAD, laying in bed  Head: Normocephalic, atraumatic. Moist oral mucosal membranes  Eyes: Anicteric, PERRL  Neck: Supple, trachea midline  Lungs:  Bilateral crackles  Heart: irregular  Abdomen:  Soft, nontender, obese  Extremities: trace peripheral edema.  Neurologic: Alert to self and place, moving all four extremities  Skin: No lesions  Access: none     Lab results: Basic Metabolic Panel:  Recent Labs Lab 04/17/17 0457 04/18/17 0318 04/18/17 0919  NA 141 140 140  K 5.3* 5.7* 5.3*  CL 105 104 104  CO2 28 28 27   GLUCOSE 113* 174* 171*  BUN 62* 72* 74*  CREATININE 3.52* 3.84* 4.11*  CALCIUM 8.6* 8.6* 8.9    Liver Function Tests: No results for input(s): AST, ALT, ALKPHOS, BILITOT, PROT, ALBUMIN in the last 168 hours. No results for input(s): LIPASE, AMYLASE in the last 168 hours. No results for input(s): AMMONIA in the last 168 hours.  CBC:  Recent Labs Lab 04/12/17 0959 04/14/17 0456 04/16/17 0443 04/18/17 0934  WBC 5.0 5.0 4.4 6.1  HGB 9.2* 9.3* 9.2* 9.9*  HCT 27.6* 27.9* 28.1* 30.3*  MCV 84.7 85.7 86.3 86.2  PLT 148* 142* 138* 150    Cardiac Enzymes: No results for input(s): CKTOTAL, CKMB, CKMBINDEX, TROPONINI in the last 168 hours.  BNP: Invalid input(s):  POCBNP  CBG:  Recent Labs Lab 04/17/17 0808 04/17/17 1127 04/17/17 2114 04/18/17 0755 04/18/17 1134  GLUCAP 69 106* 160* 128* 161*    Microbiology: Results for orders placed or performed during the hospital encounter of 04/09/17  Culture, blood (routine x 2) Call MD if unable to obtain prior to  antibiotics being given     Status: None   Collection Time: 04/10/17  5:18 AM  Result Value Ref Range Status   Specimen Description BLOOD RIGHT HAND  Final   Special Requests   Final    BOTTLES DRAWN AEROBIC AND ANAEROBIC Blood Culture adequate volume   Culture NO GROWTH 5 DAYS  Final   Report Status 04/15/2017 FINAL  Final  Culture, blood (routine x 2) Call MD if unable to obtain prior to antibiotics being given     Status: None   Collection Time: 04/10/17  5:18 AM  Result Value Ref Range Status   Specimen Description BLOOD LEFT ANTECUBITAL  Final   Special Requests   Final    BOTTLES DRAWN AEROBIC AND ANAEROBIC Blood Culture results may not be optimal due to an excessive volume of blood received in culture bottles   Culture NO GROWTH 5 DAYS  Final   Report Status 04/15/2017 FINAL  Final  MRSA PCR Screening     Status: None   Collection Time: 04/10/17  1:47 PM  Result Value Ref Range Status   MRSA by PCR NEGATIVE NEGATIVE Final    Comment:        The GeneXpert MRSA Assay (FDA approved for NASAL specimens only), is one component of a comprehensive MRSA colonization surveillance program. It is not intended to diagnose MRSA infection nor to guide or monitor treatment for MRSA infections.   C difficile quick scan w PCR reflex     Status: None   Collection Time: 04/17/17  2:50 PM  Result Value Ref Range Status   C Diff antigen NEGATIVE NEGATIVE Final   C Diff toxin NEGATIVE NEGATIVE Final   C Diff interpretation No C. difficile detected.  Final  Gastrointestinal Panel by PCR , Stool     Status: None   Collection Time: 04/17/17  2:50 PM  Result Value Ref Range Status    Campylobacter species NOT DETECTED NOT DETECTED Final   Plesimonas shigelloides NOT DETECTED NOT DETECTED Final   Salmonella species NOT DETECTED NOT DETECTED Final   Yersinia enterocolitica NOT DETECTED NOT DETECTED Final   Vibrio species NOT DETECTED NOT DETECTED Final   Vibrio cholerae NOT DETECTED NOT DETECTED Final   Enteroaggregative E coli (EAEC) NOT DETECTED NOT DETECTED Final   Enteropathogenic E coli (EPEC) NOT DETECTED NOT DETECTED Final   Enterotoxigenic E coli (ETEC) NOT DETECTED NOT DETECTED Final   Shiga like toxin producing E coli (STEC) NOT DETECTED NOT DETECTED Final   Shigella/Enteroinvasive E coli (EIEC) NOT DETECTED NOT DETECTED Final   Cryptosporidium NOT DETECTED NOT DETECTED Final   Cyclospora cayetanensis NOT DETECTED NOT DETECTED Final   Entamoeba histolytica NOT DETECTED NOT DETECTED Final   Giardia lamblia NOT DETECTED NOT DETECTED Final   Adenovirus F40/41 NOT DETECTED NOT DETECTED Final   Astrovirus NOT DETECTED NOT DETECTED Final   Norovirus GI/GII NOT DETECTED NOT DETECTED Final   Rotavirus A NOT DETECTED NOT DETECTED Final   Sapovirus (I, II, IV, and V) NOT DETECTED NOT DETECTED Final    Coagulation Studies:  Recent Labs  04/16/17 0443 04/17/17 0457 04/18/17 0318  LABPROT 33.5* 42.3* 47.7*  INR 3.20 4.29* 4.98*    Urinalysis: No results for input(s): COLORURINE, LABSPEC, PHURINE, GLUCOSEU, HGBUR, BILIRUBINUR, KETONESUR, PROTEINUR, UROBILINOGEN, NITRITE, LEUKOCYTESUR in the last 72 hours.  Invalid input(s): APPERANCEUR    Imaging:  No results found.   Assessment & Plan: Ms. Cailen Texeira is a 76 y.o. white female with coronary  artery disease, congestive heart failure, pulmonary hypertension, parkinson's, diabetes mellitus type II, COPD, DVT on warfarin, atrial fibrillation, hyperlipidemia, anemia, anxiety, patellar fracture, who was admitted to [facility]  1. Acute renal failure with hyperkalemia on chronic kidney disease stage IV with  proteinuria.  Baseline creatinine of 2.5, GFR of 17.  Chronic kidney disease secondary to hypertension and diabetes Acute renal failure secondary to overdiuresis and hypotension leading to ATN.  No IV contrast exposure.  - agree with holding diuretics, acute cardiorenal syndrome and . With patient's systolic congestive heart failure, will not give continuous IV fluids - low potassium diet - status post kayexalate and calcium gluconate.  - No acute indication for dialysis.   2. Hypertension, atrial fibrillation with RVR and acute exacerbation of systolic congestive heart failure - holding furosemide.  - Monitor volume status  3. Diabetes mellitus type II with chronic kidney disease: insulin dependent. Hemoglobin A1c 7.5% on 6/24  4. Anemia of chronic kidney disease: hemoglobin 9.9. Normocytic. Iron deficiency  LOS: [place], Amdrew Oboyle 7/8/201811:59 AM

## 2017-04-18 NOTE — Progress Notes (Signed)
Patient ID: Molly Wolf, female   DOB: 22-Jul-1941, 76 y.o.   MRN: [id number]  [facility] PROGRESS NOTE  Molly Wolf UVO:[id number] DOB: 22-Jul-1941 DOA: 04/09/2017 PCP: Darliss RidgelBergert, Jessica W, MD  HPI/Subjective: Patient feels okay.  Objective: Vitals:   04/18/17 0335 04/18/17 0953  BP: 115/71 (!) 142/81  Pulse: 85 80  Resp: 18   Temp: 97.7 F (36.5 C) 97.7 F (36.5 C)    Filed Weights   04/15/17 0423 04/16/17 0500 04/18/17 0335  Weight: 74.4 kg (164 lb) 64.4 kg (142 lb) 74.8 kg (165 lb)    ROS: Review of Systems  Unable to perform ROS: Mental acuity  Constitutional: Negative for fever.  Respiratory: Positive for cough and shortness of breath.   Cardiovascular: Negative for chest pain.  Gastrointestinal: Negative for abdominal pain.  Neurological: Negative for dizziness.  Limited with review of systems Exam: Physical Exam  HENT:  Nose: No mucosal edema.  Mouth/Throat: No oropharyngeal exudate or posterior oropharyngeal edema.  Eyes: Conjunctivae, EOM and lids are normal. Pupils are equal, round, and reactive to light.  Neck: No JVD present. Carotid bruit is not present. No edema present. No thyroid mass and no thyromegaly present.  Cardiovascular: S1 normal and S2 normal.  Exam reveals no gallop.   No murmur heard. Pulses:      Dorsalis pedis pulses are 2+ on the right side, and 2+ on the left side.  Respiratory: No respiratory distress. She has decreased breath sounds in the right lower field and the left lower field. She has no wheezes. She has no rhonchi. She has no rales.  GI: Soft. Bowel sounds are normal. There is no tenderness.  Musculoskeletal:       Right ankle: She exhibits no swelling.       Left ankle: She exhibits no swelling.  Lymphadenopathy:    She has no cervical adenopathy.  Neurological: She is alert. No cranial nerve deficit.  Skin: Skin is warm. No rash noted. Nails show no clubbing.  Psychiatric:  Answer some yes or no questions. Unable to  elaborate very much      Data Reviewed: Basic Metabolic Panel:  Recent Labs Lab 04/15/17 0446 04/16/17 0443 04/17/17 0457 04/18/17 0318 04/18/17 0919  NA 139 142 141 140 140  K 4.8 5.0 5.3* 5.7* 5.3*  CL 104 105 105 104 104  CO2 28 30 28 28 27   GLUCOSE 205* 91 113* 174* 171*  BUN 50* 55* 62* 72* 74*  CREATININE 2.80* 3.03* 3.52* 3.84* 4.11*  CALCIUM 9.0 9.0 8.6* 8.6* 8.9   CBC:  Recent Labs Lab 04/12/17 0959 04/14/17 0456 04/16/17 0443 04/18/17 0934  WBC 5.0 5.0 4.4 6.1  HGB 9.2* 9.3* 9.2* 9.9*  HCT 27.6* 27.9* 28.1* 30.3*  MCV 84.7 85.7 86.3 86.2  PLT 148* 142* 138* 150   BNP (last 3 results)  Recent Labs  01/26/17 1112 04/03/17 1918 04/05/17 0453  BNP 2,049.0* 1,139.0* 980.0*     CBG:  Recent Labs Lab 04/17/17 0808 04/17/17 1127 04/17/17 2114 04/18/17 0755 04/18/17 1134  GLUCAP 69 106* 160* 128* 161*    Recent Results (from the past 240 hour(s))  Culture, blood (routine x 2) Call MD if unable to obtain prior to antibiotics being given     Status: None   Collection Time: 04/10/17  5:18 AM  Result Value Ref Range Status   Specimen Description BLOOD RIGHT HAND  Final   Special Requests   Final    BOTTLES DRAWN AEROBIC AND ANAEROBIC  Blood Culture adequate volume   Culture NO GROWTH 5 DAYS  Final   Report Status 04/15/2017 FINAL  Final  Culture, blood (routine x 2) Call MD if unable to obtain prior to antibiotics being given     Status: None   Collection Time: 04/10/17  5:18 AM  Result Value Ref Range Status   Specimen Description BLOOD LEFT ANTECUBITAL  Final   Special Requests   Final    BOTTLES DRAWN AEROBIC AND ANAEROBIC Blood Culture results may not be optimal due to an excessive volume of blood received in culture bottles   Culture NO GROWTH 5 DAYS  Final   Report Status 04/15/2017 FINAL  Final  MRSA PCR Screening     Status: None   Collection Time: 04/10/17  1:47 PM  Result Value Ref Range Status   MRSA by PCR NEGATIVE NEGATIVE  Final    Comment:        The GeneXpert MRSA Assay (FDA approved for NASAL specimens only), is one component of a comprehensive MRSA colonization surveillance program. It is not intended to diagnose MRSA infection nor to guide or monitor treatment for MRSA infections.   C difficile quick scan w PCR reflex     Status: None   Collection Time: 04/17/17  2:50 PM  Result Value Ref Range Status   C Diff antigen NEGATIVE NEGATIVE Final   C Diff toxin NEGATIVE NEGATIVE Final   C Diff interpretation No C. difficile detected.  Final  Gastrointestinal Panel by PCR , Stool     Status: None   Collection Time: 04/17/17  2:50 PM  Result Value Ref Range Status   Campylobacter species NOT DETECTED NOT DETECTED Final   Plesimonas shigelloides NOT DETECTED NOT DETECTED Final   Salmonella species NOT DETECTED NOT DETECTED Final   Yersinia enterocolitica NOT DETECTED NOT DETECTED Final   Vibrio species NOT DETECTED NOT DETECTED Final   Vibrio cholerae NOT DETECTED NOT DETECTED Final   Enteroaggregative E coli (EAEC) NOT DETECTED NOT DETECTED Final   Enteropathogenic E coli (EPEC) NOT DETECTED NOT DETECTED Final   Enterotoxigenic E coli (ETEC) NOT DETECTED NOT DETECTED Final   Shiga like toxin producing E coli (STEC) NOT DETECTED NOT DETECTED Final   Shigella/Enteroinvasive E coli (EIEC) NOT DETECTED NOT DETECTED Final   Cryptosporidium NOT DETECTED NOT DETECTED Final   Cyclospora cayetanensis NOT DETECTED NOT DETECTED Final   Entamoeba histolytica NOT DETECTED NOT DETECTED Final   Giardia lamblia NOT DETECTED NOT DETECTED Final   Adenovirus F40/41 NOT DETECTED NOT DETECTED Final   Astrovirus NOT DETECTED NOT DETECTED Final   Norovirus GI/GII NOT DETECTED NOT DETECTED Final   Rotavirus A NOT DETECTED NOT DETECTED Final   Sapovirus (I, II, IV, and V) NOT DETECTED NOT DETECTED Final     Scheduled Meds: . amiodarone  400 mg Oral BID  . atorvastatin  20 mg Oral Daily  . carbidopa-levodopa  1  tablet Oral QID  . chlorhexidine  15 mL Mouth Rinse BID  . docusate sodium  100 mg Oral BID  . escitalopram  20 mg Oral Daily  . feeding supplement (ENSURE ENLIVE)  237 mL Oral TID BM  . gabapentin  100 mg Oral Daily  . hydrALAZINE  10 mg Oral BID  . insulin aspart  0-9 Units Subcutaneous TID WC  . insulin glargine  5 Units Subcutaneous QHS  . isosorbide mononitrate  30 mg Oral Daily  . mouth rinse  15 mL Mouth Rinse q12n4p  .  metoprolol tartrate  100 mg Oral BID  . multivitamin  15 mL Oral Daily  . QUEtiapine  12.5 mg Oral QHS  . saccharomyces boulardii  250 mg Oral Daily  . Warfarin - Pharmacist Dosing Inpatient   Does not apply q1800   Continuous Infusions: . sodium chloride      Assessment/Plan:  1. Hyperkalemia secondary to acute kidney injury. Kayexalate, calcium, insulin, D50 and sodium bicarbonate given. Repeat potassium this afternoon.  2. Acute respiratory failure. Continue oxygen supplementation.  3. Acute encephalopathy. Stop scopolamine patch and myrbitrec yesterday. Start Seroquel at night so she sleeps 4. Pneumonia. Received 5 days of antibiotic and then discontinued since pro calcitonin was negative. 5. Acute kidney injury on chronic kidney disease stage IV. Appreciate nephrology consultation 6. History of Parkinson's on Sinemet 7. Type 2 diabetes mellitus. Decrease Lantus dose with worsening kidney function 8. Acute on chronic systolic congestive heart failure. Holding Lasix with overdiuresis. Continue metoprolol. No ACE inhibitor or arm secondary to acute kidney injury. 9. Atrial fibrillation with rapid ventricular rate on metoprolol and amiodarone at this point with better control of heart rate. 10. Chronic anemia. Recent GI bleed. 11. Hyperlipidemia unspecified on atorvastatin 12. History of DVT on Coumadin. Coumadin level on hold secondary to high INR   Code Status:     Code Status Orders        Start     Ordered   04/10/17 0328  Full code  Continuous      04/10/17 0327    Code Status History    Date Active Date Inactive Code Status Order ID Comments User Context   04/03/2017 11:30 PM 04/09/2017 10:09 PM Full Code [id number]  Hugelmeyer, Jon Gills, DO ED   02/15/2017  6:16 PM 02/16/2017  6:18 PM Full Code [id number]  Altamese Dilling, MD Inpatient   01/26/2017  2:44 PM 01/29/2017  9:47 PM Full Code [id number]  Alford Highland, MD ED   12/28/2016  7:44 AM 12/29/2016  4:34 PM DNR [id number]  Delfino Lovett, MD Inpatient   12/27/2016 11:06 PM 12/28/2016  7:44 AM Full Code [id number]  Hugelmeyer, Jon Gills, DO Inpatient    Advance Directive Documentation     Most Recent Value  Type of Advance Directive  Healthcare Power of Attorney  Pre-existing out of facility DNR order (yellow form or pink MOST form)  -  "MOST" Form in Place?  -     Family Communication: Daughter and family yesterday Disposition Plan: TBD  Time spent: 26 minutes  Alford Highland  [employer]

## 2017-04-18 NOTE — Progress Notes (Signed)
ANTICOAGULATION CONSULT NOTE - follow up  Pharmacy Consult for warfarin Indication: atrial fibrillation  No Known Allergies  Patient Measurements: Height: 5' 7" (170.2 cm) Weight: 165 lb (74.8 kg) IBW/kg (Calculated) : 61.6 Heparin Dosing Weight:   Vital Signs: Temp: 97.7 F (36.5 C) (07/08 0953) Temp Source: Oral (07/08 0953) BP: 142/81 (07/08 0953) Pulse Rate: 80 (07/08 0953)  Labs:  Recent Labs  04/16/17 0443 04/17/17 0457 04/18/17 0318 04/18/17 0919 04/18/17 0934  HGB 9.2*  --   --   --  9.9*  HCT 28.1*  --   --   --  30.3*  PLT 138*  --   --   --  150  LABPROT 33.5* 42.3* 47.7*  --   --   INR 3.20 4.29* 4.98*  --   --   CREATININE 3.03* 3.52* 3.84* 4.11*  --     Estimated Creatinine Clearance: 12.1 mL/min (A) (by C-G formula based on SCr of 4.11 mg/dL (H)).   Medical History: Past Medical History:  Diagnosis Date  . Anxiety   . Arthritis   . Chronic combined systolic and diastolic CHF (congestive heart failure) ([place])    a. 01/2008 Echo ([facility]): EF > 55%, mod-sev LVH w/ grade 1 DD, biatrial enlargement, mild AS, trace MR/TR; b. EF 45-50%, GR2DD, mild to moderate aortic stenosis, mild aortic insufficieny, mild to moderate mitral regurgitation, mild tricuspid regurgitation, mild biatrial enlargement, and a small pericardial effusion  . CKD (chronic kidney disease), stage IV ([place])   . Closed patellar sleeve fracture of right knee    a. 01/2017 - conservatively managed.  Marland Kitchen COPD (chronic obstructive pulmonary disease) ([place])   . Coronary artery disease    a. s/p remote stenting of unknown vessel @ [facility].  . Depression   . Diabetes mellitus with complication ([place])   . DVT (deep venous thrombosis) ([facility])    a. in the setting of right patellar fracture on Coumadin  . Hyperlipidemia   . Hypertension   . Parkinson disease ([place])     Medications:  Prescriptions Prior to Admission  Medication Sig Dispense Refill Last Dose  . acetaminophen (TYLENOL) 325 MG tablet Take  650 mg by mouth every 6 (six) hours as needed for mild pain.   prn at prn  . atorvastatin (LIPITOR) 20 MG tablet Take 20 mg by mouth daily.   Past Week at Unknown time  . blood glucose meter kit and supplies KIT Dispense based on patient and insurance preference. Use up to four times daily as directed. (FOR ICD-9 250.00, 250.01). 1 each 0 Past Week at Unknown time  . Carbidopa-Levodopa ER (SINEMET CR) 25-100 MG tablet controlled release Take 2 tablets by mouth 4 (four) times daily. 240 tablet 1 Past Week at Unknown time  . Coenzyme Q10 (COQ10) 50 MG CAPS Take 50 mg by mouth daily.   Past Week at Unknown time  . CRANBERRY PO Take 1 tablet by mouth daily.   Past Week at Unknown time  . escitalopram (LEXAPRO) 20 MG tablet Take 20 mg by mouth daily.   Past Week at Unknown time  . furosemide (LASIX) 20 MG tablet Take 1 tablet (20 mg total) by mouth daily. 30 tablet 2 Past Week at Unknown time  . gabapentin (NEURONTIN) 300 MG capsule Take 300 mg by mouth daily.    Past Week at Unknown time  . glucose blood (ACCU-CHEK AVIVA PLUS) test strip Use as instructed 100 each 12 Past Week at Unknown time  . hydrALAZINE (  APRESOLINE) 25 MG tablet Take 1 tablet (25 mg total) by mouth 2 (two) times daily. 180 tablet 3 Past Week at Unknown time  . insulin glargine (LANTUS) 100 UNIT/ML injection Inject 40 Units into the skin at bedtime.    Past Week at Unknown time  . insulin lispro (HUMALOG) 100 UNIT/ML injection Inject 6 Units into the skin 3 (three) times daily with meals.    Past Week at Unknown time  . isosorbide mononitrate (IMDUR) 60 MG 24 hr tablet Take 1 tablet (60 mg total) by mouth daily. 30 tablet 2 Past Week at Unknown time  . loratadine (CLARITIN) 10 MG tablet Take 5 mg by mouth daily as needed for allergies.    prn at prn  . LORazepam (ATIVAN) 1 MG tablet Take 1 tablet (1 mg total) by mouth 2 (two) times daily as needed for anxiety. 5 tablet 0 prn at prn  . metoprolol tartrate (LOPRESSOR) 50 MG tablet Take  1 tablet (50 mg total) by mouth 2 (two) times daily. 180 tablet 3 Past Week at Unknown time  . mirabegron ER (MYRBETRIQ) 25 MG TB24 tablet Take 25 mg by mouth daily.   Past Week at Unknown time  . nitroGLYCERIN (NITROSTAT) 0.4 MG SL tablet Place 0.4 mg under the tongue every 5 (five) minutes as needed for chest pain.   prn at prn  . Omega-3 Fatty Acids (FISH OIL) 1000 MG CAPS Take 1,000 mg by mouth daily.   Past Week at Unknown time  . polyethylene glycol (MIRALAX / GLYCOLAX) packet Take 17 g by mouth daily as needed for mild constipation.   prn at prn  . promethazine (PHENERGAN) 25 MG tablet Take 25 mg by mouth every 6 (six) hours as needed for nausea or vomiting.   prn at prn  . saccharomyces boulardii (FLORASTOR) 250 MG capsule Take 250 mg by mouth daily.   Past Week at Unknown time  . traMADol (ULTRAM) 50 MG tablet Take 1 tablet (50 mg total) by mouth daily as needed. (Patient taking differently: Take 50 mg by mouth daily as needed for moderate pain. ) 5 tablet 0 prn at prn  . warfarin (COUMADIN) 2 MG tablet Take 1 tablet (2 mg total) by mouth as directed. Every Sunday 30 tablet 0 Past Week at Unknown time  . warfarin (COUMADIN) 3 MG tablet Take 1 tablet (3 mg total) by mouth daily at 6 PM. Mon,tue,wed,thur,friday, sat 30 tablet 1 Past Week at Unknown time   Scheduled:  . amiodarone  400 mg Oral BID  . atorvastatin  20 mg Oral Daily  . Carbidopa-Levodopa ER  2 tablet Oral QID  . chlorhexidine  15 mL Mouth Rinse BID  . docusate sodium  100 mg Oral BID  . escitalopram  20 mg Oral Daily  . feeding supplement (ENSURE ENLIVE)  237 mL Oral TID BM  . gabapentin  100 mg Oral Daily  . hydrALAZINE  10 mg Oral BID  . insulin aspart  0-9 Units Subcutaneous TID WC  . insulin glargine  5 Units Subcutaneous QHS  . isosorbide mononitrate  30 mg Oral Daily  . mouth rinse  15 mL Mouth Rinse q12n4p  . metoprolol tartrate  100 mg Oral BID  . multivitamin  15 mL Oral Daily  . QUEtiapine  12.5 mg Oral QHS   . saccharomyces boulardii  250 mg Oral Daily  . sodium bicarbonate  50 mEq Intravenous Once  . Warfarin - Pharmacist Dosing Inpatient   Does not apply  q1800    Assessment: Pharmacy consulted to dose and monitor warfarin therapy in this 76 year old patient chronically on warfarin for Afib.  Home dose: 3 mg daily except 2 mg on sundays.   7/5 INR 2.59 Warfarin 3 mg 7/6 INR 3.20   Dose Held 7/7 INR 4.29 Dose Held 7/8 INR 4.98  Patient has been initiated on amiodarone 7/5  Goal of Therapy:  INR 2-3 Monitor platelets by anticoagulation protocol: Yes   Plan:  Will hold warfarin dose again today due to significant jump in INR likely due to interaction with amiodarone.    , A 04/18/2017,10:19 AM

## 2017-04-18 NOTE — Progress Notes (Signed)
Patient Name: Brandee Markin Date of Encounter: 04/18/2017  Primary Cardiologist: End  Hospital Problem List     Active Problems:   Acute respiratory failure with hypoxia (HCC)     Subjective   Remains in Afib with well controlled rates. Renal function continues to climb. INR supra-therapeutic. Potassium elevated s/p Kayexalate. SOB the same. No chest pain.   Inpatient Medications    Scheduled Meds: . amiodarone  400 mg Oral BID  . atorvastatin  20 mg Oral Daily  . Carbidopa-Levodopa ER  2 tablet Oral QID  . chlorhexidine  15 mL Mouth Rinse BID  . dextrose  25 g Intravenous Once  . docusate sodium  100 mg Oral BID  . escitalopram  20 mg Oral Daily  . feeding supplement (ENSURE ENLIVE)  237 mL Oral TID BM  . gabapentin  100 mg Oral Daily  . hydrALAZINE  10 mg Oral BID  . insulin aspart  0-9 Units Subcutaneous TID WC  . insulin glargine  5 Units Subcutaneous QHS  . insulin regular  10 Units Intravenous Once  . isosorbide mononitrate  30 mg Oral Daily  . mouth rinse  15 mL Mouth Rinse q12n4p  . metoprolol tartrate  100 mg Oral BID  . multivitamin  15 mL Oral Daily  . QUEtiapine  12.5 mg Oral QHS  . saccharomyces boulardii  250 mg Oral Daily  . sodium bicarbonate  50 mEq Intravenous Once  . Warfarin - Pharmacist Dosing Inpatient   Does not apply q1800   Continuous Infusions: . calcium gluconate    . sodium chloride     PRN Meds: acetaminophen **OR** acetaminophen, bisacodyl, loratadine, LORazepam, nitroGLYCERIN, ondansetron **OR** ondansetron (ZOFRAN) IV, polyethylene glycol, traMADol   Vital Signs    Vitals:   04/17/17 0342 04/17/17 1100 04/17/17 1933 04/18/17 0335  BP: 99/66 (!) 103/56 105/71 115/71  Pulse: (!) 58 61 97 85  Resp: 18  19 18   Temp: 97.7 F (36.5 C) 97.7 F (36.5 C) 97.8 F (36.6 C) 97.7 F (36.5 C)  TempSrc:  Oral    SpO2: 93% 100% 99% 100%  Weight:    165 lb (74.8 kg)  Height:        Intake/Output Summary (Last 24 hours) at 04/18/17  0847 Last data filed at 04/17/17 1700  Gross per 24 hour  Intake              480 ml  Output                0 ml  Net              480 ml   Filed Weights   04/15/17 0423 04/16/17 0500 04/18/17 0335  Weight: 164 lb (74.4 kg) 142 lb (64.4 kg) 165 lb (74.8 kg)    Physical Exam    GEN: Frail appearing, in no acute distress. Tremor noted.  HEENT: Grossly normal.  Neck: Supple, no JVD, carotid bruits, or masses. Cardiac: Irregularly irregular, II/VI systolic murmur, no rubs, or gallops. No clubbing, cyanosis, edema.  Radials/DP/PT 2+ and equal bilaterally.  Respiratory:  Diminished breath sounds bilaterally. GI: Soft, nontender, nondistended, BS + x 4. MS: No deformity or atrophy. Skin: Warm and dry, no rash. Neuro:  Strength and sensation are intact. Psych: Alert. Normal affect.  Labs    CBC  Recent Labs  04/16/17 0443  WBC 4.4  HGB 9.2*  HCT 28.1*  MCV 86.3  PLT 138*   Basic Metabolic Panel  Recent Labs  04/17/17 0457 04/18/17 0318  NA 141 140  K 5.3* 5.7*  CL 105 104  CO2 28 28  GLUCOSE 113* 174*  BUN 62* 72*  CREATININE 3.52* 3.84*  CALCIUM 8.6* 8.6*   Liver Function Tests No results for input(s): AST, ALT, ALKPHOS, BILITOT, PROT, ALBUMIN in the last 72 hours. No results for input(s): LIPASE, AMYLASE in the last 72 hours. Cardiac Enzymes No results for input(s): CKTOTAL, CKMB, CKMBINDEX, TROPONINI in the last 72 hours. BNP Invalid input(s): POCBNP D-Dimer No results for input(s): DDIMER in the last 72 hours. Hemoglobin A1C No results for input(s): HGBA1C in the last 72 hours. Fasting Lipid Panel No results for input(s): CHOL, HDL, LDLCALC, TRIG, CHOLHDL, LDLDIRECT in the last 72 hours. Thyroid Function Tests No results for input(s): TSH, T4TOTAL, T3FREE, THYROIDAB in the last 72 hours.  Invalid input(s): FREET3  Telemetry    Afib, 70s bpm - Personally Reviewed  ECG    n/a - Personally Reviewed  Radiology    No results found.  Cardiac  Studies   TTE 04/04/17: Study Conclusions  - Left ventricle: LVEF appears depressed at approximately 35% with severe hypokinesis of mid/distal anterior and anterolateral walls. Moderate LVH Atrial fibrillation makes evaluation of LVEF difficult In comparison to echo from April 2018 these findings are more prominent and LVEF is worse.t, But in that study (April 2018) apical windows were foreshortened making some of comparison diffcult - Aortic valve: AV is thickened, calcified with mildly restricted motion. There was mild regurgitation. - Mitral valve: There was moderate regurgitation. - Left atrium: The atrium was moderately dilated. - Right atrium: The atrium was mildly dilated. - Pulmonary arteries: PA peak pressure: 65 mm Hg (S). - Pericardium, extracardiac: A small pericardial effusion was identified.  02/10/17 Long term monitor (14 days) - Predominant NSR with average rate 74 bpm (range 56-103 bpm). Patient triggered event corresponds to sinus rhythm. Occasional PACs; frequent PVCs. Two episodes nonsustained ventricular tachycardia lasting up to 5 beats. 368 episodes of SVT with max rate 182 bpm and longest episode 15 min, 35 sec (average rate 102 bpm). Irregularity during some episodes raises possibility of atrial fibrillation and/or atrial flutter with variable block. There numerous episodes of atrial tachycardia, possibly Afib vs atrial flutter with variable AV block vs sustained SVT. Most suggestive of SVT but atrial flutter and/or A fib cannot be excluded.  01/26/2017 Echo  -LV wall thickness was increased in pattern of mild LVH with moderate focal basal hypertrophy of septum. Systolic function mildly reduced with EF estimated 45-50%. Akinesis of basal mid inferolateral and inferior myocardium. Features consistent with grade 2 diastolic dysfunction. Aortic valve moderately thickened, mildly calcified leaflets. Cusp separation reduced. Mild to moderate  stenosis, mild regurgitation. Mitral valve: mild to moderate regurgitation. Left atrium: mildly dilated. Right ventricle: mildly dilated.   01/26/2017 US venous Img lower bilateral 1. Occlusive thrombus of the right peroneal vein in the calf. No evidence of additional thrombus of the bilateral lower extremities. 2. Left popliteal fossa complex cyst, probably a Baker cyst.  01/2008 Echo ([facility]): EF >55%, mod-sev LVH w/ grade 1 DD, biatrial enlargement, mild AS, trace MR/TR; b. EF 45-50%, GR2DD, mild to moderate aortic stenosis, mild aortic insufficieny, mild to moderate mitral regurgitation, mild tricuspid regurgitation, mild biatrial enlargement, and a small pericardial effusion.  Patient Profile     76 y.o. female who is being seen today for the evaluation of syncope and hypoxiaat the request of Dr. Letitia Libra, MD. 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 She presents s/phypoxic episode, syncope, and unresponsiveness / confusion [facility] d/t CHF exacerbation with active GI bleed and anemia. Of note, GI workup and endoscopy were clear at the time of 6/29 discharge.   Assessment & Plan    1. Acute respiratory failure with hypoxia. - 6/29 CXRnot consistent with acute pulmonary edema (recent admission for CHF exacerbation). There is an area of focal opacity in left lung base as well as right middle lobe that likely represents pneumonia. - Previous CT and CXR as above. - Likely exacerbation 2/2 pneumonia and chronic anemia complicated by poorly controlled Afib with RVR. - 04/12/17 Ddimer to r/o PE in setting of DVT with recent sub-therapeutic INR during last admission with recent GI bleed and need for  EGD(currently on Warfarin prophylaxis s/p thrombus as above).  - Ddimer value 867.15, consider imaging, defer to IM - Likely multifactorial including PNA, worsening anemia, Afib with RVR, and acute on chronic combined CHF - Stable anemia - Current Osat 95%. Remains on 2L [place].  2. Persistent Afib: - Continueamiodarone 400mg  BID x 1 week, 200 mg bid x 1 week, then 200 mg daily thereafter - Rates continue to be well controlled - On metoprolol 100mg  PO BID - CHADS2VASc at least 6 (CHF, HTN, age x 2, DM, female) - Continue warfarin for anticoagulation per pharmacy - Not a good candidate for digoxin given advanced age and renal function - Not a good candidate for CCB given CM - Family does not seem to be interested in cardioversion.  - INR 4.98, Coumadin continues to be held  3. Acute on chronic combined CHF: - No observed pulmonary edema on 6/29 CXR - Ejection fraction 35% on echocardiogram 04/04/2017 with wall motion abnormality - Not on ACE/ARB/Entresto/spiro given CKD stage IV - Continue to monitor renal function closely - Lasix discontinued given AKI - Continue Imdur/hydralazine  - Chronic anemia likely contributing to shortness of breath symptoms. Most recent CBC results as above. - Monitoring basic metabolic panel daily  4. DM2 - Glucose remains elevated at 205 (BMP, 7/5) - Continue basal insulin injection adjusted for hospital diet. Sliding scale insulin as well. - Per IM  5. Acute on CKD stage IV: - Renal function continues to worsen - Avoid nephrotoxic agents - Elevated creatinine at 3.84(7/8) - Continue to monitor renal function with labs - Lasix discontinued - IV fluids  6. Acute on chronic anemia of chronic disease: - Maintain HGB > 8.5 - Check CBC  7. DVT prophylaxis:  - 01/26/2017 US venous Img lower bilateral with occlusive thrombus of the right peroneal vein in the calf. No evidence of additional thrombus of the bilateral lower extremities. -  Anticoagulation on warfarin - 7/2 ddimer results 867.15   8. Hypertension:  - Stable - Continue Imdur, hydralazine, and Lopressor   9. CAD:  - Stable - Continue Imdur/Lopressor/Lipitor 20mg /Coumadin in place of ASA - Continue hydralazine and nitro - Given her recent drop in EF as above along with her symptoms, consider ischemic evaluation via nuclear stress testing prior to discharge if confusion decreases and improved awareness  10. Parkinson's:  - Continue Sinemet  - Per IM - Cannot rule out some component of dysautonomia  - If the  above workup is completely unrevealing, consider neuro eval  11. COPD - Managed per IM  12. Confusion: -Scopolamine patch discontinued by IM  13. Hyperkalemia: - Status post Kayexalate  - Per IM  High risk of readmission given her frail state  Signed, Carola Frost [facility] Pager: [phone] 04/18/2017, 8:47 AM   I have personally seen and examined this patient with Eula Listen, PA-C. I agree with the assessment and plan as outlined above. She is a 76 yo female with h/o CAD, atrial fib, chronic systolic and diastolic CHF, Pulm HTN, COPD, CKD admitted with respiratory failure, hypoxia. She does not have evidence of gross volume overload. Renal function worsening. Lasix on hold. Her atrial fib is rate controlled.  My exam shows an elderly female in NAD, seems confused. UJ:[id number] irreg Pulm: clear bilaterally. Ext: no edema Labs reviewed by me. No AM EKG. Telemetry reviewed by me an shows atrial fibrillation.  Plan: She does not appear to be volume overloaded. Worsened renal failure presumed to be from over diuresis. Lasix on hold. Gentle IV fluid hydration. Atrial fib is rate controlled. She is on coumadin. As noted yesterday, LV systolic function has changed, now with LVEF=35% with anterior wall motion abnormality. When consider ischemic testing if she improves clinically. Not a candidate for cardiac cath right now with worsened  renal function.   Verne Carrow 04/18/2017 10:15 AM

## 2017-04-18 NOTE — Progress Notes (Signed)
INR= 4.98 and Potassium level this morning is 5.7. New order received from DR. Pyreddy to give 15 gm of Kayexalate x 1 dose. No new order received for INR level.  Patient has been resting well this shift. Respirations even and unlabored. Will continue to monitor.

## 2017-04-19 DIAGNOSIS — G20 Parkinson's disease: Secondary | ICD-10-CM

## 2017-04-19 DIAGNOSIS — N170 Acute kidney failure with tubular necrosis: Secondary | ICD-10-CM

## 2017-04-19 DIAGNOSIS — I5041 Acute combined systolic (congestive) and diastolic (congestive) heart failure: Secondary | ICD-10-CM

## 2017-04-19 DIAGNOSIS — Z515 Encounter for palliative care: Secondary | ICD-10-CM

## 2017-04-19 DIAGNOSIS — N184 Chronic kidney disease, stage 4 (severe): Secondary | ICD-10-CM

## 2017-04-19 LAB — CBC
HEMATOCRIT: 28.8 % — AB (ref 35.0–47.0)
Hemoglobin: 9.4 g/dL — ABNORMAL LOW (ref 12.0–16.0)
MCH: 28.4 pg (ref 26.0–34.0)
MCHC: 32.7 g/dL (ref 32.0–36.0)
MCV: 86.9 fL (ref 80.0–100.0)
PLATELETS: 138 10*3/uL — AB (ref 150–440)
RBC: 3.31 MIL/uL — ABNORMAL LOW (ref 3.80–5.20)
RDW: 16.7 % — AB (ref 11.5–14.5)
WBC: 4.9 10*3/uL (ref 3.6–11.0)

## 2017-04-19 LAB — RENAL FUNCTION PANEL
Albumin: 3.3 g/dL — ABNORMAL LOW (ref 3.5–5.0)
Anion gap: 8 (ref 5–15)
BUN: 77 mg/dL — ABNORMAL HIGH (ref 6–20)
CO2: 28 mmol/L (ref 22–32)
CREATININE: 3.9 mg/dL — AB (ref 0.44–1.00)
Calcium: 8.4 mg/dL — ABNORMAL LOW (ref 8.9–10.3)
Chloride: 103 mmol/L (ref 101–111)
GFR calc non Af Amer: 10 mL/min — ABNORMAL LOW (ref >60)
GFR, EST AFRICAN AMERICAN: 12 mL/min — AB (ref >60)
Glucose, Bld: 200 mg/dL — ABNORMAL HIGH (ref 65–99)
POTASSIUM: 5.2 mmol/L — AB (ref 3.5–5.1)
Phosphorus: 4 mg/dL (ref 2.5–4.6)
Sodium: 139 mmol/L (ref 135–145)

## 2017-04-19 LAB — GLUCOSE, CAPILLARY
Glucose-Capillary: 308 mg/dL — ABNORMAL HIGH (ref 65–99)
Glucose-Capillary: 145 mg/dL — ABNORMAL HIGH (ref 65–99)
Glucose-Capillary: 148 mg/dL — ABNORMAL HIGH (ref 65–99)
Glucose-Capillary: 218 mg/dL — ABNORMAL HIGH (ref 65–99)

## 2017-04-19 LAB — PROTIME-INR
INR: 5.26 — AB
Prothrombin Time: 49.8 seconds — ABNORMAL HIGH (ref 11.4–15.2)

## 2017-04-19 MED ORDER — AMIODARONE HCL 200 MG PO TABS
200.0000 mg | ORAL_TABLET | Freq: Two times a day (BID) | ORAL | Status: DC
  Administered 2017-04-19 – 2017-04-27 (×15): 200 mg via ORAL
  Filled 2017-04-19 (×15): qty 1

## 2017-04-19 NOTE — Progress Notes (Signed)
Patient Name: Molly CunasJudy Wolf Date of Encounter: 04/19/2017  Primary Cardiologist: End  Hospital Problem List     Active Problems:   Acute respiratory failure with hypoxia (HCC)     Subjective   Remains in Afib with well controlled rates. Labs pending this morning. SOB slightly improved. No chest pain.   Inpatient Medications    Scheduled Meds: . amiodarone  400 mg Oral BID  . atorvastatin  20 mg Oral Daily  . carbidopa-levodopa  1 tablet Oral QID  . chlorhexidine  15 mL Mouth Rinse BID  . docusate sodium  100 mg Oral BID  . escitalopram  20 mg Oral Daily  . feeding supplement (ENSURE ENLIVE)  237 mL Oral TID BM  . gabapentin  100 mg Oral Daily  . hydrALAZINE  10 mg Oral BID  . insulin aspart  0-9 Units Subcutaneous TID WC  . insulin glargine  5 Units Subcutaneous QHS  . isosorbide mononitrate  30 mg Oral Daily  . mouth rinse  15 mL Mouth Rinse q12n4p  . metoprolol tartrate  100 mg Oral BID  . multivitamin  15 mL Oral Daily  . QUEtiapine  12.5 mg Oral QHS  . saccharomyces boulardii  250 mg Oral Daily  . Warfarin - Pharmacist Dosing Inpatient   Does not apply q1800   Continuous Infusions: . sodium chloride     PRN Meds: acetaminophen **OR** acetaminophen, bisacodyl, loratadine, LORazepam, nitroGLYCERIN, ondansetron **OR** ondansetron (ZOFRAN) IV, polyethylene glycol, traMADol   Vital Signs    Vitals:   04/18/17 0953 04/18/17 2008 04/19/17 0500 04/19/17 0539  BP: (!) 142/81 131/83  102/66  Pulse: 80 74  63  Resp:  18  18  Temp: 97.7 F (36.5 C) 97.7 F (36.5 C)  97.6 F (36.4 C)  TempSrc: Oral Oral  Oral  SpO2: 100% 100%  97%  Weight:   166 lb (75.3 kg)   Height:        Intake/Output Summary (Last 24 hours) at 04/19/17 0713 Last data filed at 04/18/17 2200  Gross per 24 hour  Intake                0 ml  Output              150 ml  Net             -150 ml   Filed Weights   04/16/17 0500 04/18/17 0335 04/19/17 0500  Weight: 142 lb (64.4 kg) 165 lb  (74.8 kg) 166 lb (75.3 kg)    Physical Exam    GEN: Frail appearing, in no acute distress. Tremor noted.  HEENT: Grossly normal.  Neck: Supple, no JVD, carotid bruits, or masses. Cardiac: Irregularly irregular, II/VI systolic murmur, no rubs, or gallops. No clubbing, cyanosis, edema.  Radials/DP/PT 2+ and equal bilaterally.  Respiratory:  Diminished breath sounds bilaterally. GI: Soft, nontender, nondistended, BS + x 4. MS: No deformity or atrophy. Skin: Warm and dry, no rash. Neuro:  Strength and sensation are intact. Psych: Alert. Normal affect.  Labs    CBC  Recent Labs  04/18/17 0934  WBC 6.1  HGB 9.9*  HCT 30.3*  MCV 86.2  PLT 150   Basic Metabolic Panel  Recent Labs  04/18/17 0318 04/18/17 0919  NA 140 140  K 5.7* 5.3*  CL 104 104  CO2 28 27  GLUCOSE 174* 171*  BUN 72* 74*  CREATININE 3.84* 4.11*  CALCIUM 8.6* 8.9   Liver Function Tests No  results for input(s): AST, ALT, ALKPHOS, BILITOT, PROT, ALBUMIN in the last 72 hours. No results for input(s): LIPASE, AMYLASE in the last 72 hours. Cardiac Enzymes No results for input(s): CKTOTAL, CKMB, CKMBINDEX, TROPONINI in the last 72 hours. BNP Invalid input(s): POCBNP D-Dimer No results for input(s): DDIMER in the last 72 hours. Hemoglobin A1C No results for input(s): HGBA1C in the last 72 hours. Fasting Lipid Panel No results for input(s): CHOL, HDL, LDLCALC, TRIG, CHOLHDL, LDLDIRECT in the last 72 hours. Thyroid Function Tests No results for input(s): TSH, T4TOTAL, T3FREE, THYROIDAB in the last 72 hours.  Invalid input(s): FREET3  Telemetry    Afib, 70s bpm - Personally Reviewed  ECG    n/a - Personally Reviewed  Radiology    No results found.  Cardiac Studies   TTE 04/04/17: Study Conclusions  - Left ventricle: LVEF appears depressed at approximately 35% with severe hypokinesis of mid/distal anterior and anterolateral walls. Moderate LVH Atrial fibrillation makes evaluation of  LVEF difficult In comparison to echo from April 2018 these findings are more prominent and LVEF is worse.t, But in that study (April 2018) apical windows were foreshortened making some of comparison diffcult - Aortic valve: AV is thickened, calcified with mildly restricted motion. There was mild regurgitation. - Mitral valve: There was moderate regurgitation. - Left atrium: The atrium was moderately dilated. - Right atrium: The atrium was mildly dilated. - Pulmonary arteries: PA peak pressure: 65 mm Hg (S). - Pericardium, extracardiac: A small pericardial effusion was identified.  02/10/17 Long term monitor (14 days) - Predominant NSR with average rate 74 bpm (range 56-103 bpm). Patient triggered event corresponds to sinus rhythm. Occasional PACs; frequent PVCs. Two episodes nonsustained ventricular tachycardia lasting up to 5 beats. 368 episodes of SVT with max rate 182 bpm and longest episode 15 min, 35 sec (average rate 102 bpm). Irregularity during some episodes raises possibility of atrial fibrillation and/or atrial flutter with variable block. There numerous episodes of atrial tachycardia, possibly Afib vs atrial flutter with variable AV block vs sustained SVT. Most suggestive of SVT but atrial flutter and/or A fib cannot be excluded.  01/26/2017 Echo  -LV wall thickness was increased in pattern of mild LVH with moderate focal basal hypertrophy of septum. Systolic function mildly reduced with EF estimated 45-50%. Akinesis of basal mid inferolateral and inferior myocardium. Features consistent with grade 2 diastolic dysfunction. Aortic valve moderately thickened, mildly calcified leaflets. Cusp separation reduced. Mild to moderate stenosis, mild regurgitation. Mitral valve: mild to moderate regurgitation. Left atrium: mildly dilated. Right ventricle: mildly dilated.   01/26/2017 US venous Img lower bilateral 1. Occlusive thrombus of the right peroneal vein in the calf.  No evidence of additional thrombus of the bilateral lower extremities. 2. Left popliteal fossa complex cyst, probably a Baker cyst.  01/2008 Echo ([facility]): EF >55%, mod-sev LVH w/ grade 1 DD, biatrial enlargement, mild AS, trace MR/TR; b. EF 45-50%, GR2DD, mild to moderate aortic stenosis, mild aortic insufficieny, mild to moderate mitral regurgitation, mild tricuspid regurgitation, mild biatrial enlargement, and a small pericardial effusion.  Patient Profile     76 y.o. female who is being seen today for the evaluation of syncope and hypoxiaat the request of Dr. Letitia Libra, MD. 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 She presents  s/phypoxic episode, syncope, and unresponsiveness / confusion [facility] d/t CHF exacerbation with active GI bleed and anemia. Of note, GI workup and endoscopy were clear at the time of 6/29 discharge.   Assessment & Plan    1. Acute respiratory failure with hypoxia. - 6/29 CXRnot consistent with acute pulmonary edema (recent admission for CHF exacerbation). There is an area of focal opacity in left lung base as well as right middle lobe that likely represents pneumonia. - Previous CT and CXR as above. - Likely exacerbation 2/2 pneumonia and chronic anemia complicated by poorly controlled Afib with RVR. - 04/12/17 Ddimer to r/o PE in setting of DVT with recent sub-therapeutic INR during last admission with recent GI bleed and need for EGD(currently on Warfarin prophylaxis s/p thrombus as above).  - Ddimer value 867.15, consider imaging, defer to IM - Likely multifactorial including PNA, worsening anemia, Afib with RVR, and acute on chronic combined CHF - Stable anemia -  Current Osat 95%. Remains on 2L [place].  2. Persistent Afib: - Continueamiodarone 400mg  BID x 1 week, 200 mg bid x 1 week, then 200 mg daily thereafter - Rates continue to be well controlled - On metoprolol 100mg  PO BID - CHADS2VASc at least 6 (CHF, HTN, age x 2, DM, female) - Continue warfarin for anticoagulation per pharmacy - Not a good candidate for digoxin given advanced age and renal function - Not a good candidate for CCB given CM - Family does not seem to be interested in cardioversion.  - INR 4.98, Coumadin continues to be held  3. Acute on chronic combined CHF: - No observed pulmonary edema on 6/29 CXR - Ejection fraction 35% on echocardiogram 04/04/2017 with wall motion abnormality - Not on ACE/ARB/Entresto/spiro given CKD stage IV - Continue to monitor renal function closely - Lasix discontinued given AKI - Continue Imdur/hydralazine  - Chronic anemia likely contributing to shortness of breath symptoms. Most recent CBC results as above. - Monitoring basic metabolic panel daily - Would benefit from cardiac cath when stable medically (renal function precludes at this time)  4. DM2 - Glucose remains elevated at 205 (BMP, 7/5) - Continue basal insulin injection adjusted for hospital diet. Sliding scale insulin as well. - Per IM  5. Acute on CKD stage IV: - Likely over diuresis  - Renal function continues to worsen, pending this morning - Avoid nephrotoxic agents - Elevated creatinine at 3.84(7/8) - Continue to monitor renal function with labs - Lasix discontinued - IV fluids, gentle  6. Acute on chronic anemia of chronic disease: - Maintain HGB > 8.5 - Check CBC  7. DVT prophylaxis:  - 01/26/2017 US venous Img lower bilateral with occlusive thrombus of the right peroneal vein in the calf. No evidence of additional thrombus of the bilateral lower extremities. - Anticoagulation on warfarin - 7/2 ddimer results 867.15   8. Hypertension:  - Stable -  Continue Imdur, hydralazine, and Lopressor   9. CAD:  - Stable - Continue Imdur/Lopressor/Lipitor 20mg /Coumadin in place of ASA - Continue hydralazine and nitro - Given her recent drop in EF as above along with her symptoms, consider ischemic evaluation via nuclear stress testing vs cardiac cath prior to discharge if confusion decreases and improved awareness  10. Parkinson's:  - Continue Sinemet  - Per IM - Cannot rule out some component of dysautonomia  - If the above workup is completely unrevealing, consider neuro eval  11. COPD - Managed per IM  12. Confusion: -  Scopolamine patch discontinued by IM  13. Hyperkalemia: - Status post Kayexalate - Pending  - Per IM  Signed, Eula Listen, PA-C [facility] Pager: [phone] 04/19/2017, 7:13 AM

## 2017-04-19 NOTE — Care Management (Signed)
Palliative care consult is in progress.  Renal status still declining.  patient is not able to participate with physical therapy today.  Decrease in amiodarone dose today

## 2017-04-19 NOTE — Progress Notes (Addendum)
ANTICOAGULATION CONSULT NOTE - follow up  Pharmacy Consult for warfarin Indication: atrial fibrillation  No Known Allergies  Patient Measurements: Height: _0  (170.2 cm) Weight: 166 lb (75.3 kg) IBW/kg (Calculated) : 61.6 Heparin Dosing Weight:   Vital Signs: Temp: 97.5 F (36.4 C) (07/09 0814) Temp Source: Oral (07/09 0814) BP: 124/81 (07/09 0814) Pulse Rate: 67 (07/09 0814)  Labs:  Recent Labs  04/17/17 0457 04/18/17 0318 04/18/17 0919 04/18/17 0934 04/19/17 0632  HGB  --   --   --  9.9*  --   HCT  --   --   --  30.3*  --   PLT  --   --   --  150  --   LABPROT 42.3* 47.7*  --   --  49.8*  INR 4.29* 4.98*  --   --  5.26*  CREATININE 3.52* 3.84* 4.11*  --  3.90*    Estimated Creatinine Clearance: 12.8 mL/min (A) (by C-G formula based on SCr of 3.9 mg/dL (H)).   Medical History: Past Medical History:  Diagnosis Date  . Anxiety   . Arthritis   . Chronic combined systolic and diastolic CHF (congestive heart failure) ([place])    a. 01/2008 Echo ([facility]): EF > 55%, mod-sev LVH w/ grade 1 DD, biatrial enlargement, mild AS, trace MR/TR; b. EF 45-50%, GR2DD, mild to moderate aortic stenosis, mild aortic insufficieny, mild to moderate mitral regurgitation, mild tricuspid regurgitation, mild biatrial enlargement, and a small pericardial effusion  . CKD (chronic kidney disease), stage IV ([place])   . Closed patellar sleeve fracture of right knee    a. 01/2017 - conservatively managed.  Marland Kitchen COPD (chronic obstructive pulmonary disease) ([place])   . Coronary artery disease    a. s/p remote stenting of unknown vessel @ [facility].  . Depression   . Diabetes mellitus with complication ([place])   . DVT (deep venous thrombosis) ([facility])    a. in the setting of right patellar fracture on Coumadin  . Hyperlipidemia   . Hypertension   . Parkinson disease ([place])     Medications:  Prescriptions Prior to Admission  Medication Sig Dispense Refill Last Dose  . acetaminophen (TYLENOL) 325 MG tablet Take  650 mg by mouth every 6 (six) hours as needed for mild pain.   prn at prn  . atorvastatin (LIPITOR) 20 MG tablet Take 20 mg by mouth daily.   Past Week at Unknown time  . blood glucose meter kit and supplies KIT Dispense based on patient and insurance preference. Use up to four times daily as directed. (FOR ICD-9 250.00, 250.01). 1 each 0 Past Week at Unknown time  . Carbidopa-Levodopa ER (SINEMET CR) 25-100 MG tablet controlled release Take 2 tablets by mouth 4 (four) times daily. 240 tablet 1 Past Week at Unknown time  . Coenzyme Q10 (COQ10) 50 MG CAPS Take 50 mg by mouth daily.   Past Week at Unknown time  . CRANBERRY PO Take 1 tablet by mouth daily.   Past Week at Unknown time  . escitalopram (LEXAPRO) 20 MG tablet Take 20 mg by mouth daily.   Past Week at Unknown time  . furosemide (LASIX) 20 MG tablet Take 1 tablet (20 mg total) by mouth daily. 30 tablet 2 Past Week at Unknown time  . gabapentin (NEURONTIN) 300 MG capsule Take 300 mg by mouth daily.    Past Week at Unknown time  . glucose blood (ACCU-CHEK AVIVA PLUS) test strip Use as instructed 100 each 12 Past Week  at Unknown time  . hydrALAZINE (APRESOLINE) 25 MG tablet Take 1 tablet (25 mg total) by mouth 2 (two) times daily. 180 tablet 3 Past Week at Unknown time  . insulin glargine (LANTUS) 100 UNIT/ML injection Inject 40 Units into the skin at bedtime.    Past Week at Unknown time  . insulin lispro (HUMALOG) 100 UNIT/ML injection Inject 6 Units into the skin 3 (three) times daily with meals.    Past Week at Unknown time  . isosorbide mononitrate (IMDUR) 60 MG 24 hr tablet Take 1 tablet (60 mg total) by mouth daily. 30 tablet 2 Past Week at Unknown time  . loratadine (CLARITIN) 10 MG tablet Take 5 mg by mouth daily as needed for allergies.    prn at prn  . LORazepam (ATIVAN) 1 MG tablet Take 1 tablet (1 mg total) by mouth 2 (two) times daily as needed for anxiety. 5 tablet 0 prn at prn  . metoprolol tartrate (LOPRESSOR) 50 MG tablet Take  1 tablet (50 mg total) by mouth 2 (two) times daily. 180 tablet 3 Past Week at Unknown time  . mirabegron ER (MYRBETRIQ) 25 MG TB24 tablet Take 25 mg by mouth daily.   Past Week at Unknown time  . nitroGLYCERIN (NITROSTAT) 0.4 MG SL tablet Place 0.4 mg under the tongue every 5 (five) minutes as needed for chest pain.   prn at prn  . Omega-3 Fatty Acids (FISH OIL) 1000 MG CAPS Take 1,000 mg by mouth daily.   Past Week at Unknown time  . polyethylene glycol (MIRALAX / GLYCOLAX) packet Take 17 g by mouth daily as needed for mild constipation.   prn at prn  . promethazine (PHENERGAN) 25 MG tablet Take 25 mg by mouth every 6 (six) hours as needed for nausea or vomiting.   prn at prn  . saccharomyces boulardii (FLORASTOR) 250 MG capsule Take 250 mg by mouth daily.   Past Week at Unknown time  . traMADol (ULTRAM) 50 MG tablet Take 1 tablet (50 mg total) by mouth daily as needed. (Patient taking differently: Take 50 mg by mouth daily as needed for moderate pain. ) 5 tablet 0 prn at prn  . warfarin (COUMADIN) 2 MG tablet Take 1 tablet (2 mg total) by mouth as directed. Every Sunday 30 tablet 0 Past Week at Unknown time  . warfarin (COUMADIN) 3 MG tablet Take 1 tablet (3 mg total) by mouth daily at 6 PM. Mon,tue,wed,thur,friday, sat 30 tablet 1 Past Week at Unknown time   Scheduled:  . amiodarone  400 mg Oral BID  . atorvastatin  20 mg Oral Daily  . carbidopa-levodopa  1 tablet Oral QID  . chlorhexidine  15 mL Mouth Rinse BID  . docusate sodium  100 mg Oral BID  . escitalopram  20 mg Oral Daily  . feeding supplement (ENSURE ENLIVE)  237 mL Oral TID BM  . gabapentin  100 mg Oral Daily  . hydrALAZINE  10 mg Oral BID  . insulin aspart  0-9 Units Subcutaneous TID WC  . insulin glargine  5 Units Subcutaneous QHS  . isosorbide mononitrate  30 mg Oral Daily  . mouth rinse  15 mL Mouth Rinse q12n4p  . metoprolol tartrate  100 mg Oral BID  . multivitamin  15 mL Oral Daily  . QUEtiapine  12.5 mg Oral QHS  .  saccharomyces boulardii  250 mg Oral Daily  . Warfarin - Pharmacist Dosing Inpatient   Does not apply [phone]  Assessment: Pharmacy consulted to dose and monitor warfarin therapy in this 76 year old patient chronically on warfarin for Afib.  Home dose: 3 mg daily except 2 mg on sundays.   7/5 INR 2.59 Warfarin 3 mg 7/6 INR 3.20   Dose Held 7/7 INR 4.29 Dose Held 7/8 INR 4.98 Dose Held 7/9       INR 5.26   Patient has been initiated on amiodarone 7/5  Goal of Therapy:  INR 2-3 Monitor platelets by anticoagulation protocol: Yes   Plan:  Will hold warfarin dose again today due to significant jump in INR likely due to interaction with amiodarone. Continue to monitor for s/sx of bleeding. With pt hx of clot and INR only 5.26 do not feel as though VitK is warranted at this time.  Ramond Dial, Pharm.D, BCPS Clinical Pharmacist  04/19/2017,8:53 AM

## 2017-04-19 NOTE — Progress Notes (Signed)
PT Cancellation Note  Patient Details Name: Molly CunasJudy Gul MRN: [id number] DOB: 13-Feb-1941   Cancelled Treatment:    Reason Eval/Treat Not Completed: Medical issues which prohibited therapy. Per chart review, pt INR is 5.26 indicating bed rest. Re attempt at a later time/date as the pt's medical status improves. It is also noted that a palliative consult has been placed.    Scot DockHeidi E Barnes, PTA 04/19/2017, 12:16 PM

## 2017-04-19 NOTE — Progress Notes (Signed)
[facility] [place], [place] 04/19/17  Subjective:   Patient is doing fair.  Laying in the bed.  No acute distress.  She is oriented only to self. Serum creatinine from today is slightly better at 3.90 down from 4.11 INR is still high at 5.6  Objective:  Vital signs in last 24 hours:  Temp:  [97.5 F (36.4 C)-97.7 F (36.5 C)] 97.7 F (36.5 C) (07/09 1152) Pulse Rate:  [63-74] 67 (07/09 1152) Resp:  [17-19] 17 (07/09 1152) BP: (102-131)/(66-83) 119/72 (07/09 1152) SpO2:  [95 %-100 %] 95 % (07/09 1152) Weight:  [75.3 kg (166 lb)] 75.3 kg (166 lb) (07/09 0500)  Weight change: 0.454 kg (1 lb) Filed Weights   04/16/17 0500 04/18/17 0335 04/19/17 0500  Weight: 64.4 kg (142 lb) 74.8 kg (165 lb) 75.3 kg (166 lb)    Intake/Output:    Intake/Output Summary (Last 24 hours) at 04/19/17 1813 Last data filed at 04/19/17 1653  Gross per 24 hour  Intake              577 ml  Output              750 ml  Net             -173 ml     Physical Exam: General: Laying in the bed, no acute distress  HEENT anicteric  Neck supple  Pulm/lungs Normal breathing, mild rhonchi at bases  CVS/Heart irregular  Abdomen:  Soft, mild diffuse tenderness  Extremities: Trace edema  Neurologic: Able to answer a few simple questions  Skin: Scattered ecchymosis          Basic Metabolic Panel:   Recent Labs Lab 04/16/17 0443 04/17/17 0457 04/18/17 0318 04/18/17 0919 04/19/17 0632  NA 142 141 140 140 139  K 5.0 5.3* 5.7* 5.3* 5.2*  CL 105 105 104 104 103  CO2 30 28 28 27 28   GLUCOSE 91 113* 174* 171* 200*  BUN 55* 62* 72* 74* 77*  CREATININE 3.03* 3.52* 3.84* 4.11* 3.90*  CALCIUM 9.0 8.6* 8.6* 8.9 8.4*  PHOS  --   --   --   --  4.0     CBC:  Recent Labs Lab 04/14/17 0456 04/16/17 0443 04/18/17 0934 04/19/17 0636  WBC 5.0 4.4 6.1 4.9  HGB 9.3* 9.2* 9.9* 9.4*  HCT 27.9* 28.1* 30.3* 28.8*  MCV 85.7 86.3 86.2 86.9  PLT 142* 138* 150 138*     No results  found for: HEPBSAG, HEPBSAB, HEPBIGM    Microbiology:  Recent Results (from the past 240 hour(s))  Culture, blood (routine x 2) Call MD if unable to obtain prior to antibiotics being given     Status: None   Collection Time: 04/10/17  5:18 AM  Result Value Ref Range Status   Specimen Description BLOOD RIGHT HAND  Final   Special Requests   Final    BOTTLES DRAWN AEROBIC AND ANAEROBIC Blood Culture adequate volume   Culture NO GROWTH 5 DAYS  Final   Report Status 04/15/2017 FINAL  Final  Culture, blood (routine x 2) Call MD if unable to obtain prior to antibiotics being given     Status: None   Collection Time: 04/10/17  5:18 AM  Result Value Ref Range Status   Specimen Description BLOOD LEFT ANTECUBITAL  Final   Special Requests   Final    BOTTLES DRAWN AEROBIC AND ANAEROBIC Blood Culture results may not be optimal due to an excessive volume of  blood received in culture bottles   Culture NO GROWTH 5 DAYS  Final   Report Status 04/15/2017 FINAL  Final  MRSA PCR Screening     Status: None   Collection Time: 04/10/17  1:47 PM  Result Value Ref Range Status   MRSA by PCR NEGATIVE NEGATIVE Final    Comment:        The GeneXpert MRSA Assay (FDA approved for NASAL specimens only), is one component of a comprehensive MRSA colonization surveillance program. It is not intended to diagnose MRSA infection nor to guide or monitor treatment for MRSA infections.   C difficile quick scan w PCR reflex     Status: None   Collection Time: 04/17/17  2:50 PM  Result Value Ref Range Status   C Diff antigen NEGATIVE NEGATIVE Final   C Diff toxin NEGATIVE NEGATIVE Final   C Diff interpretation No C. difficile detected.  Final  Gastrointestinal Panel by PCR , Stool     Status: None   Collection Time: 04/17/17  2:50 PM  Result Value Ref Range Status   Campylobacter species NOT DETECTED NOT DETECTED Final   Plesimonas shigelloides NOT DETECTED NOT DETECTED Final   Salmonella species NOT  DETECTED NOT DETECTED Final   Yersinia enterocolitica NOT DETECTED NOT DETECTED Final   Vibrio species NOT DETECTED NOT DETECTED Final   Vibrio cholerae NOT DETECTED NOT DETECTED Final   Enteroaggregative E coli (EAEC) NOT DETECTED NOT DETECTED Final   Enteropathogenic E coli (EPEC) NOT DETECTED NOT DETECTED Final   Enterotoxigenic E coli (ETEC) NOT DETECTED NOT DETECTED Final   Shiga like toxin producing E coli (STEC) NOT DETECTED NOT DETECTED Final   Shigella/Enteroinvasive E coli (EIEC) NOT DETECTED NOT DETECTED Final   Cryptosporidium NOT DETECTED NOT DETECTED Final   Cyclospora cayetanensis NOT DETECTED NOT DETECTED Final   Entamoeba histolytica NOT DETECTED NOT DETECTED Final   Giardia lamblia NOT DETECTED NOT DETECTED Final   Adenovirus F40/41 NOT DETECTED NOT DETECTED Final   Astrovirus NOT DETECTED NOT DETECTED Final   Norovirus GI/GII NOT DETECTED NOT DETECTED Final   Rotavirus A NOT DETECTED NOT DETECTED Final   Sapovirus (I, II, IV, and V) NOT DETECTED NOT DETECTED Final    Coagulation Studies:  Recent Labs  04/17/17 0457 04/18/17 0318 04/19/17 0632  LABPROT 42.3* 47.7* 49.8*  INR 4.29* 4.98* 5.26*    Urinalysis: No results for input(s): COLORURINE, LABSPEC, PHURINE, GLUCOSEU, HGBUR, BILIRUBINUR, KETONESUR, PROTEINUR, UROBILINOGEN, NITRITE, LEUKOCYTESUR in the last 72 hours.  Invalid input(s): APPERANCEUR    Imaging: No results found.   Medications:   . sodium chloride     . amiodarone  200 mg Oral BID  . atorvastatin  20 mg Oral Daily  . carbidopa-levodopa  1 tablet Oral QID  . chlorhexidine  15 mL Mouth Rinse BID  . docusate sodium  100 mg Oral BID  . escitalopram  20 mg Oral Daily  . feeding supplement (ENSURE ENLIVE)  237 mL Oral TID BM  . gabapentin  100 mg Oral Daily  . hydrALAZINE  10 mg Oral BID  . insulin aspart  0-9 Units Subcutaneous TID WC  . insulin glargine  5 Units Subcutaneous QHS  . isosorbide mononitrate  30 mg Oral Daily  . mouth  rinse  15 mL Mouth Rinse q12n4p  . metoprolol tartrate  100 mg Oral BID  . multivitamin  15 mL Oral Daily  . QUEtiapine  12.5 mg Oral QHS  . saccharomyces boulardii  250  mg Oral Daily  . Warfarin - Pharmacist Dosing Inpatient   Does not apply q1800   acetaminophen **OR** acetaminophen, bisacodyl, loratadine, LORazepam, nitroGLYCERIN, ondansetron **OR** ondansetron (ZOFRAN) IV, polyethylene glycol, traMADol  Assessment/ Plan:  76 y.o. caucsian  female with coronary artery disease, congestive heart failure, pulmonary hypertension, parkinson's, diabetes mellitus type II, COPD, DVT on warfarin, atrial fibrillation, hyperlipidemia, anemia, anxiety, patellar fracture,  1. Acute renal failure with hyperkalemia on chronic kidney disease stage IV with proteinuria.  Baseline creatinine of 2.5, GFR of 17.  Chronic kidney disease secondary to hypertension and diabetes Acute renal failure secondary to overdiuresis and hypotension leading to ATN.  No IV contrast exposure.  - agree with holding diuretics - serum creatinine slightly improved today  2. Diabetes mellitus type II with chronic kidney disease: insulin dependent. Hemoglobin A1c 7.5% on 6/24   LOS: 9 [facility] 7/9/20186:13 PM  [facility] [address] [place], [place] [phone]

## 2017-04-19 NOTE — Care Management (Addendum)
Barrier- increasing lethargy. Worsening renal status.  Required Kayexalate and calcium gluconate for elevated potassium.  Palliative consult for goals of treatment

## 2017-04-19 NOTE — Consult Note (Signed)
Consultation Note Date: 04/19/2017   Patient Name: Molly Wolf  DOB: [birth year]  MRN: [id number]  Age / Sex: 76 y.o., female  PCP: Arlyce Harman Luther Redo, MD Referring Physician: Loletha Grayer, MD  Reason for Consultation: Establishing goals of care  HPI/Patient Profile: 76 y.o. female  with past medical history of parkinsons, CKD stg 4, COPD, mixed CHF (EF 35%), CAD s/p PCI, Afib, PAH, recent GIB who was admitted on 04/09/2017 with hypoxia, pulmonary edema and a syncopal episode at her grandsons house.  The patient was admitted from 6/23 - 6/29 for GIB and CHF.  She was discharged to her grandson's house for care.  Shortly after she became non-responsive.  Her grandson performed CPR and EMS was called.  During this admission she was found to have HCAP and CHF exacerbation along with acute on chronic kidney failure and decreased urine output.  She is being followed by cardiology and nephrology.  She has had 5 admissions in 6 months.   Clinical Assessment and Goals of Care:  I have reviewed medical records including EPIC notes, labs and imaging, received report from the care team, assessed the patient and then met at the bedside along with her brother and sister in law  to discuss diagnosis prognosis, [place], EOL wishes, disposition and options.  I introduced Palliative Medicine as specialized medical care for people living with serious illness. It focuses on providing relief from the symptoms and stress of a serious illness. The goal is to improve quality of life for both the patient and the family.  We discussed a brief life review of the patient.  Her grandson, [name], and her daughter [name] are the patient's primary care takers.  She had 3 daughters - one is diseased and the other, Deluca, used to care for Mrs. Faxon in her home but when her husband became ill she could no longer care for her mother.    Currently Mrs. Atkin is pleasantly confused.  She asks me what church I attend.  Her religion and faith are very important to her.  I asked her if she understood what was going on with her health and she said "No" but indicated that at times it is very hard to breath.  She became frightened and asked me if she was going to die soon.  She said "I don't want to die yet".  I tried to gently reassure her.    I called her grandson [name] on the phone and scheduled a meeting with him at 8:00 on 7/10.  I spent another 15 minutes in the hallway talking with her brother Laverna Peace about his life and family.   Laverna Peace indicated that he felt his sister was too debilitated and weak to tolerate hemodialysis.  I attempted to elicit values and goals of care important to the patient.  She is too confused and anxious to engage in this type of conversation right now.  Questions and concerns were addressed. The family was encouraged to call with questions or concerns.  PMT will continue  to support holistically.   Primary Decision Maker:  NEXT OF KIN  Daughters and Grandson    SUMMARY OF RECOMMENDATIONS   PMT will meet with Seth at 8:00 on 7/10 to discuss code status and [place]   Code Status/Advance Care Planning:  Full code    Symptom Management:   Per primary team.  Additional Recommendations (Limitations, Scope, Preferences):  Full Scope Treatment  Palliative Prophylaxis:   Aspiration and Delirium Protocol  Psycho-social/Spiritual:   Desire for further Chaplaincy support: Yes patient would appreciate the support.  Prognosis:   < 3 months secondary to frequent hospitalizations, CHF with PAH, and worsening stage 4 CKD.  Discharge Planning: To Be Determined      Primary Diagnoses: Present on Admission: . Acute respiratory failure with hypoxia ([place])   I have reviewed the medical record, interviewed the patient and family, and examined the patient. The following aspects are  pertinent.  Past Medical History:  Diagnosis Date  . Anxiety   . Arthritis   . Chronic combined systolic and diastolic CHF (congestive heart failure) ([place])    a. 01/2008 Echo ([facility]): EF > 55%, mod-sev LVH w/ grade 1 DD, biatrial enlargement, mild AS, trace MR/TR; b. EF 45-50%, GR2DD, mild to moderate aortic stenosis, mild aortic insufficieny, mild to moderate mitral regurgitation, mild tricuspid regurgitation, mild biatrial enlargement, and a small pericardial effusion  . CKD (chronic kidney disease), stage IV ([place])   . Closed patellar sleeve fracture of right knee    a. 01/2017 - conservatively managed.  Marland Kitchen COPD (chronic obstructive pulmonary disease) ([place])   . Coronary artery disease    a. s/p remote stenting of unknown vessel @ [facility].  . Depression   . Diabetes mellitus with complication ([place])   . DVT (deep venous thrombosis) ([facility])    a. in the setting of right patellar fracture on Coumadin  . Hyperlipidemia   . Hypertension   . Parkinson disease [facility])    Social History   Social History  . Marital status: Single    Spouse name: N/A  . Number of children: N/A  . Years of education: N/A   Social History Main Topics  . Smoking status: Never Smoker  . Smokeless tobacco: Never Used  . Alcohol use No  . Drug use: No  . Sexual activity: No   Other Topics Concern  . None   Social History Narrative  . None   Family History  Problem Relation Age of Onset  . Cervical cancer Mother   . Kidney failure Father   . CAD Father   . CAD Brother   . Prostate cancer Neg Hx   . Kidney cancer Neg Hx   . Bladder Cancer Neg Hx    Scheduled Meds: . amiodarone  200 mg Oral BID  . atorvastatin  20 mg Oral Daily  . carbidopa-levodopa  1 tablet Oral QID  . chlorhexidine  15 mL Mouth Rinse BID  . docusate sodium  100 mg Oral BID  . escitalopram  20 mg Oral Daily  . feeding supplement (ENSURE ENLIVE)  237 mL Oral TID BM  . gabapentin  100 mg Oral Daily  . hydrALAZINE  10 mg Oral BID  .  insulin aspart  0-9 Units Subcutaneous TID WC  . insulin glargine  5 Units Subcutaneous QHS  . isosorbide mononitrate  30 mg Oral Daily  . mouth rinse  15 mL Mouth Rinse q12n4p  . metoprolol tartrate  100 mg Oral BID  . multivitamin  15  mL Oral Daily  . QUEtiapine  12.5 mg Oral QHS  . saccharomyces boulardii  250 mg Oral Daily  . Warfarin - Pharmacist Dosing Inpatient   Does not apply q1800   Continuous Infusions: . sodium chloride     PRN Meds:.acetaminophen **OR** acetaminophen, bisacodyl, loratadine, LORazepam, nitroGLYCERIN, ondansetron **OR** ondansetron (ZOFRAN) IV, polyethylene glycol, traMADol No Known Allergies Review of Systems complained of trouble breathing at times. Denied anorexa, insomnia, pain, changes in bowel habits  Physical Exam  Pleasantly confused female, with poor color, no apparent distress Pulse rrr difficult to hear Resp NAD Abdomen soft, nt, nd  Vital Signs: BP 119/72 (BP Location: Right Arm)   Pulse 67   Temp 97.7 F (36.5 C) (Oral)   Resp 17   Ht 5' 7"  (1.702 m)   Wt 75.3 kg (166 lb)   SpO2 95%   BMI 26.00 kg/m  Pain Assessment: No/denies pain   Pain Score: 0-No pain   SpO2: SpO2: 95 % O2 Device:SpO2: 95 % O2 Flow Rate: .O2 Flow Rate (L/min): 2 L/min  IO: Intake/output summary:  Intake/Output Summary (Last 24 hours) at 04/19/17 1545 Last data filed at 04/19/17 1350  Gross per 24 hour  Intake              340 ml  Output              750 ml  Net             -410 ml    LBM: Last BM Date: 04/18/17 Baseline Weight: Weight: 68 kg (150 lb) Most recent weight: Weight: 75.3 kg (166 lb)     Palliative Assessment/Data:   Flowsheet Rows     Most Recent Value  Intake Tab  Referral Department  Hospitalist  Unit at Time of Referral  ICU  Palliative Care Primary Diagnosis  Nephrology  Date Notified  04/19/17  Palliative Care Type  New Palliative care  Reason for referral  Clarify Goals of Care  Date of Admission  04/09/17  Date first  seen by Palliative Care  04/19/17  # of days Palliative referral response time  0 Day(s)  # of days IP prior to Palliative referral  10  Clinical Assessment  Palliative Performance Scale Score  30%  Pain Max last 24 hours  10  Psychosocial & Spiritual Assessment  Palliative Care Outcomes  Patient/Family meeting held?  Yes  Who was at the meeting?  patient, brother Laverna Peace and sister in law  Palliative Care Outcomes  Clarified goals of care      Time In: 3:00 Time Out: 3:50 Time Total: 50 min. Greater than 50%  of this time was spent counseling and coordinating care related to the above assessment and plan.  Signed by: Florentina Jenny, PA-C Palliative Medicine Pager: [phone]  Please contact Palliative Medicine Team phone at [phone] for questions and concerns.  For individual provider: See Shea Evans

## 2017-04-19 NOTE — Progress Notes (Signed)
Patient ID: Molly Wolf, female   DOB: 1941/10/11, 76 y.o.   MRN: [id number]  [facility] PROGRESS NOTE  Molly Wolf WUJ:[id number] DOB: 1941/10/11 DOA: 04/09/2017 PCP: Darliss RidgelBergert, Jessica W, MD  HPI/Subjective: Patient complains of lower abdominal pain, hard to eat. Feels weak  Objective: Vitals:   04/19/17 0539 04/19/17 0814  BP: 102/66 124/81  Pulse: 63 67  Resp: 18 19  Temp: 97.6 F (36.4 C) (!) 97.5 F (36.4 C)    Filed Weights   04/16/17 0500 04/18/17 0335 04/19/17 0500  Weight: 64.4 kg (142 lb) 74.8 kg (165 lb) 75.3 kg (166 lb)    ROS: Review of Systems  Unable to perform ROS: Mental acuity  Constitutional: Negative for fever.  Respiratory: Positive for cough and shortness of breath.   Cardiovascular: Negative for chest pain.  Gastrointestinal: Positive for abdominal pain.  Neurological: Positive for weakness. Negative for dizziness.  Limited with review of systems Exam: Physical Exam  HENT:  Nose: No mucosal edema.  Mouth/Throat: No oropharyngeal exudate or posterior oropharyngeal edema.  Eyes: Conjunctivae, EOM and lids are normal. Pupils are equal, round, and reactive to light.  Neck: No JVD present. Carotid bruit is not present. No edema present. No thyroid mass and no thyromegaly present.  Cardiovascular: S1 normal and S2 normal.  Exam reveals no gallop.   No murmur heard. Pulses:      Dorsalis pedis pulses are 2+ on the right side, and 2+ on the left side.  Respiratory: No respiratory distress. She has decreased breath sounds in the right lower field and the left lower field. She has no wheezes. She has no rhonchi. She has no rales.  GI: Soft. Bowel sounds are normal. There is no tenderness.  Musculoskeletal:       Right ankle: She exhibits no swelling.       Left ankle: She exhibits no swelling.  Lymphadenopathy:    She has no cervical adenopathy.  Neurological: She is alert. No cranial nerve deficit.  Skin: Skin is warm. No rash noted. Nails show  no clubbing.  Psychiatric:  Answer some yes or no questions. Unable to elaborate very much      Data Reviewed: Basic Metabolic Panel:  Recent Labs Lab 04/16/17 0443 04/17/17 0457 04/18/17 0318 04/18/17 0919 04/19/17 0632  NA 142 141 140 140 139  K 5.0 5.3* 5.7* 5.3* 5.2*  CL 105 105 104 104 103  CO2 30 28 28 27 28   GLUCOSE 91 113* 174* 171* 200*  BUN 55* 62* 72* 74* 77*  CREATININE 3.03* 3.52* 3.84* 4.11* 3.90*  CALCIUM 9.0 8.6* 8.6* 8.9 8.4*  PHOS  --   --   --   --  4.0   CBC:  Recent Labs Lab 04/12/17 0959 04/14/17 0456 04/16/17 0443 04/18/17 0934  WBC 5.0 5.0 4.4 6.1  HGB 9.2* 9.3* 9.2* 9.9*  HCT 27.6* 27.9* 28.1* 30.3*  MCV 84.7 85.7 86.3 86.2  PLT 148* 142* 138* 150   BNP (last 3 results)  Recent Labs  01/26/17 1112 04/03/17 1918 04/05/17 0453  BNP 2,049.0* 1,139.0* 980.0*     CBG:  Recent Labs Lab 04/18/17 0755 04/18/17 1134 04/18/17 1642 04/18/17 2102 04/19/17 0741  GLUCAP 128* 161* 130* 110* 308*    Recent Results (from the past 240 hour(s))  Culture, blood (routine x 2) Call MD if unable to obtain prior to antibiotics being given     Status: None   Collection Time: 04/10/17  5:18 AM  Result Value Ref  Range Status   Specimen Description BLOOD RIGHT HAND  Final   Special Requests   Final    BOTTLES DRAWN AEROBIC AND ANAEROBIC Blood Culture adequate volume   Culture NO GROWTH 5 DAYS  Final   Report Status 04/15/2017 FINAL  Final  Culture, blood (routine x 2) Call MD if unable to obtain prior to antibiotics being given     Status: None   Collection Time: 04/10/17  5:18 AM  Result Value Ref Range Status   Specimen Description BLOOD LEFT ANTECUBITAL  Final   Special Requests   Final    BOTTLES DRAWN AEROBIC AND ANAEROBIC Blood Culture results may not be optimal due to an excessive volume of blood received in culture bottles   Culture NO GROWTH 5 DAYS  Final   Report Status 04/15/2017 FINAL  Final  MRSA PCR Screening     Status: None    Collection Time: 04/10/17  1:47 PM  Result Value Ref Range Status   MRSA by PCR NEGATIVE NEGATIVE Final    Comment:        The GeneXpert MRSA Assay (FDA approved for NASAL specimens only), is one component of a comprehensive MRSA colonization surveillance program. It is not intended to diagnose MRSA infection nor to guide or monitor treatment for MRSA infections.   C difficile quick scan w PCR reflex     Status: None   Collection Time: 04/17/17  2:50 PM  Result Value Ref Range Status   C Diff antigen NEGATIVE NEGATIVE Final   C Diff toxin NEGATIVE NEGATIVE Final   C Diff interpretation No C. difficile detected.  Final  Gastrointestinal Panel by PCR , Stool     Status: None   Collection Time: 04/17/17  2:50 PM  Result Value Ref Range Status   Campylobacter species NOT DETECTED NOT DETECTED Final   Plesimonas shigelloides NOT DETECTED NOT DETECTED Final   Salmonella species NOT DETECTED NOT DETECTED Final   Yersinia enterocolitica NOT DETECTED NOT DETECTED Final   Vibrio species NOT DETECTED NOT DETECTED Final   Vibrio cholerae NOT DETECTED NOT DETECTED Final   Enteroaggregative E coli (EAEC) NOT DETECTED NOT DETECTED Final   Enteropathogenic E coli (EPEC) NOT DETECTED NOT DETECTED Final   Enterotoxigenic E coli (ETEC) NOT DETECTED NOT DETECTED Final   Shiga like toxin producing E coli (STEC) NOT DETECTED NOT DETECTED Final   Shigella/Enteroinvasive E coli (EIEC) NOT DETECTED NOT DETECTED Final   Cryptosporidium NOT DETECTED NOT DETECTED Final   Cyclospora cayetanensis NOT DETECTED NOT DETECTED Final   Entamoeba histolytica NOT DETECTED NOT DETECTED Final   Giardia lamblia NOT DETECTED NOT DETECTED Final   Adenovirus F40/41 NOT DETECTED NOT DETECTED Final   Astrovirus NOT DETECTED NOT DETECTED Final   Norovirus GI/GII NOT DETECTED NOT DETECTED Final   Rotavirus A NOT DETECTED NOT DETECTED Final   Sapovirus (I, II, IV, and V) NOT DETECTED NOT DETECTED Final     Scheduled  Meds: . amiodarone  400 mg Oral BID  . atorvastatin  20 mg Oral Daily  . carbidopa-levodopa  1 tablet Oral QID  . chlorhexidine  15 mL Mouth Rinse BID  . docusate sodium  100 mg Oral BID  . escitalopram  20 mg Oral Daily  . feeding supplement (ENSURE ENLIVE)  237 mL Oral TID BM  . gabapentin  100 mg Oral Daily  . hydrALAZINE  10 mg Oral BID  . insulin aspart  0-9 Units Subcutaneous TID WC  . insulin glargine  5 Units Subcutaneous QHS  . isosorbide mononitrate  30 mg Oral Daily  . mouth rinse  15 mL Mouth Rinse q12n4p  . metoprolol tartrate  100 mg Oral BID  . multivitamin  15 mL Oral Daily  . QUEtiapine  12.5 mg Oral QHS  . saccharomyces boulardii  250 mg Oral Daily  . Warfarin - Pharmacist Dosing Inpatient   Does not apply q1800    Assessment/Plan:  1. Acute kidney injury on chronic kidney disease stage IV. Appreciate nephrology consultation. With history of congestive heart failure hesitant about giving IV fluids. 2. Hyperkalemia secondary to acute kidney injury.  Treated yesterday. 3. Acute respiratory failure. Tapered off oxygen. 4. Acute encephalopathy. Start Seroquel at night so she sleeps. Still with some confusion. 2 CAT scans were negative. 5. Pneumonia. Received 5 days of antibiotic and then discontinued since pro calcitonin was negative. 6. History of Parkinson's on Sinemet 7. Type 2 diabetes mellitus. Decrease Lantus dose with worsening kidney function 8. Acute on chronic systolic congestive heart failure. Holding Lasix with overdiuresis. Continue metoprolol. No ACE inhibitor or arm secondary to acute kidney injury. 9. Atrial fibrillation with rapid ventricular rate on metoprolol and amiodarone at this point with better control of heart rate. 10. Chronic anemia. Recent GI bleed. 11. Hyperlipidemia unspecified on atorvastatin 12. History of DVT on Coumadin. Coumadin level on hold secondary to high INR 13. Palliative care consultation for goals of care   Code Status:      Code Status Orders        Start     Ordered   04/10/17 0328  Full code  Continuous     04/10/17 0327    Code Status History    Date Active Date Inactive Code Status Order ID Comments User Context   04/03/2017 11:30 PM 04/09/2017 10:09 PM Full Code [id number]  Hugelmeyer, Jon Gills, DO ED   02/15/2017  6:16 PM 02/16/2017  6:18 PM Full Code [id number]  Altamese Dilling, MD Inpatient   01/26/2017  2:44 PM 01/29/2017  9:47 PM Full Code [id number]  Alford Highland, MD ED   12/28/2016  7:44 AM 12/29/2016  4:34 PM DNR [id number]  Delfino Lovett, MD Inpatient   12/27/2016 11:06 PM 12/28/2016  7:44 AM Full Code [id number]  Hugelmeyer, Jon Gills, DO Inpatient    Advance Directive Documentation     Most Recent Value  Type of Advance Directive  Healthcare Power of Attorney  Pre-existing out of facility DNR order (yellow form or pink MOST form)  -  "MOST" Form in Place?  -     Family Communication: left message for daughter Disposition Plan: TBD  Time spent: 25 minutes  Alford Highland  [employer]

## 2017-04-19 NOTE — Progress Notes (Signed)
Dr. Renae Glosswieting on the floor. md made aware of inr 5.26 no new orders continue to monitor for signs of bleeding

## 2017-04-20 ENCOUNTER — Encounter: Payer: Self-pay | Admitting: *Deleted

## 2017-04-20 DIAGNOSIS — Z7189 Other specified counseling: Secondary | ICD-10-CM

## 2017-04-20 LAB — BASIC METABOLIC PANEL
ANION GAP: 9 (ref 5–15)
BUN: 84 mg/dL — AB (ref 6–20)
CO2: 28 mmol/L (ref 22–32)
Calcium: 8.4 mg/dL — ABNORMAL LOW (ref 8.9–10.3)
Chloride: 101 mmol/L (ref 101–111)
Creatinine, Ser: 4.18 mg/dL — ABNORMAL HIGH (ref 0.44–1.00)
GFR calc Af Amer: 11 mL/min — ABNORMAL LOW (ref >60)
GFR, EST NON AFRICAN AMERICAN: 9 mL/min — AB (ref >60)
Glucose, Bld: 244 mg/dL — ABNORMAL HIGH (ref 65–99)
POTASSIUM: 5.5 mmol/L — AB (ref 3.5–5.1)
SODIUM: 138 mmol/L (ref 135–145)

## 2017-04-20 LAB — HEMOGLOBIN: HEMOGLOBIN: 9.3 g/dL — AB (ref 12.0–16.0)

## 2017-04-20 LAB — BLOOD GAS, VENOUS
Acid-Base Excess: 3.6 mmol/L — ABNORMAL HIGH (ref 0.0–2.0)
Bicarbonate: 29.9 mmol/L — ABNORMAL HIGH (ref 20.0–28.0)
FIO2: 0.21
O2 Saturation: UNDETERMINED %
PATIENT TEMPERATURE: 37
pCO2, Ven: 53 mmHg (ref 44.0–60.0)
pH, Ven: 7.36 (ref 7.250–7.430)

## 2017-04-20 LAB — GLUCOSE, CAPILLARY
Glucose-Capillary: 195 mg/dL — ABNORMAL HIGH (ref 65–99)
Glucose-Capillary: 212 mg/dL — ABNORMAL HIGH (ref 65–99)
Glucose-Capillary: 60 mg/dL — ABNORMAL LOW (ref 65–99)
Glucose-Capillary: 84 mg/dL (ref 65–99)
Glucose-Capillary: 79 mg/dL (ref 65–99)

## 2017-04-20 LAB — PROTIME-INR
INR: 2.85
Prothrombin Time: 30.5 seconds — ABNORMAL HIGH (ref 11.4–15.2)

## 2017-04-20 MED ORDER — SODIUM POLYSTYRENE SULFONATE 15 GM/60ML PO SUSP
30.0000 g | Freq: Once | ORAL | Status: AC
  Administered 2017-04-20: 30 g via ORAL
  Filled 2017-04-20: qty 120

## 2017-04-20 MED ORDER — WARFARIN SODIUM 1 MG PO TABS
1.5000 mg | ORAL_TABLET | Freq: Every day | ORAL | Status: DC
  Administered 2017-04-20 – 2017-04-22 (×3): 1.5 mg via ORAL
  Filled 2017-04-20 (×4): qty 1

## 2017-04-20 NOTE — Progress Notes (Signed)
Patient ID: Molly Wolf Larner, female   DOB: 23-Aug-1941, 76 y.o.   MRN: [id number]   [facility] PROGRESS NOTE  Molly Wolf Vanwey WUJ:[id number] DOB: 23-Aug-1941 DOA: 04/09/2017 PCP: Darliss RidgelBergert, Jessica W, MD  HPI/Subjective: Patient complains of feeling groggy. I saw her after lunch. She complains of feeling weak.   Objective: Vitals:   04/20/17 0735 04/20/17 1126  BP: 135/87 120/80  Pulse: 70 83  Resp: 18 17  Temp: 97.7 F (36.5 C) 97.7 F (36.5 C)    Filed Weights   04/18/17 0335 04/19/17 0500 04/20/17 0500  Weight: 74.8 kg (165 lb) 75.3 kg (166 lb) 69.9 kg (154 lb)    ROS: Review of Systems  Unable to perform ROS: Acuity of condition  Limited with review of systems Exam: Physical Exam  Constitutional: She appears lethargic.  HENT:  Nose: No mucosal edema.  Mouth/Throat: No oropharyngeal exudate or posterior oropharyngeal edema.  Eyes: Conjunctivae, EOM and lids are normal. Pupils are equal, round, and reactive to light.  Neck: No JVD present. Carotid bruit is not present. No edema present. No thyroid mass and no thyromegaly present.  Cardiovascular: S1 normal and S2 normal.  Exam reveals no gallop.   No murmur heard. Pulses:      Dorsalis pedis pulses are 2+ on the right side, and 2+ on the left side.  Respiratory: No respiratory distress. She has decreased breath sounds in the right lower field and the left lower field. She has no wheezes. She has no rhonchi. She has no rales.  GI: Soft. Bowel sounds are normal. There is no tenderness.  Musculoskeletal:       Right ankle: She exhibits no swelling.       Left ankle: She exhibits no swelling.  Lymphadenopathy:    She has no cervical adenopathy.  Neurological: She appears lethargic. No cranial nerve deficit.  Skin: Skin is warm. No rash noted. Nails show no clubbing.  Psychiatric:  Answer some yes or no questions. Unable to elaborate very much      Data Reviewed: Basic Metabolic Panel:  Recent Labs Lab  04/17/17 0457 04/18/17 0318 04/18/17 0919 04/19/17 0632 04/20/17 0517  NA 141 140 140 139 138  K 5.3* 5.7* 5.3* 5.2* 5.5*  CL 105 104 104 103 101  CO2 28 28 27 28 28   GLUCOSE 113* 174* 171* 200* 244*  BUN 62* 72* 74* 77* 84*  CREATININE 3.52* 3.84* 4.11* 3.90* 4.18*  CALCIUM 8.6* 8.6* 8.9 8.4* 8.4*  PHOS  --   --   --  4.0  --    CBC:  Recent Labs Lab 04/14/17 0456 04/16/17 0443 04/18/17 0934 04/19/17 0636 04/20/17 0517  WBC 5.0 4.4 6.1 4.9  --   HGB 9.3* 9.2* 9.9* 9.4* 9.3*  HCT 27.9* 28.1* 30.3* 28.8*  --   MCV 85.7 86.3 86.2 86.9  --   PLT 142* 138* 150 138*  --    BNP (last 3 results)  Recent Labs  01/26/17 1112 04/03/17 1918 04/05/17 0453  BNP 2,049.0* 1,139.0* 980.0*     CBG:  Recent Labs Lab 04/19/17 1149 04/19/17 1637 04/19/17 2115 04/20/17 0744 04/20/17 1128  GLUCAP 145* 148* 218* 195* 212*    Recent Results (from the past 240 hour(s))  C difficile quick scan w PCR reflex     Status: None   Collection Time: 04/17/17  2:50 PM  Result Value Ref Range Status   C Diff antigen NEGATIVE NEGATIVE Final   C Diff toxin NEGATIVE NEGATIVE Final  C Diff interpretation No C. difficile detected.  Final  Gastrointestinal Panel by PCR , Stool     Status: None   Collection Time: 04/17/17  2:50 PM  Result Value Ref Range Status   Campylobacter species NOT DETECTED NOT DETECTED Final   Plesimonas shigelloides NOT DETECTED NOT DETECTED Final   Salmonella species NOT DETECTED NOT DETECTED Final   Yersinia enterocolitica NOT DETECTED NOT DETECTED Final   Vibrio species NOT DETECTED NOT DETECTED Final   Vibrio cholerae NOT DETECTED NOT DETECTED Final   Enteroaggregative E coli (EAEC) NOT DETECTED NOT DETECTED Final   Enteropathogenic E coli (EPEC) NOT DETECTED NOT DETECTED Final   Enterotoxigenic E coli (ETEC) NOT DETECTED NOT DETECTED Final   Shiga like toxin producing E coli (STEC) NOT DETECTED NOT DETECTED Final   Shigella/Enteroinvasive E coli (EIEC)  NOT DETECTED NOT DETECTED Final   Cryptosporidium NOT DETECTED NOT DETECTED Final   Cyclospora cayetanensis NOT DETECTED NOT DETECTED Final   Entamoeba histolytica NOT DETECTED NOT DETECTED Final   Giardia lamblia NOT DETECTED NOT DETECTED Final   Adenovirus F40/41 NOT DETECTED NOT DETECTED Final   Astrovirus NOT DETECTED NOT DETECTED Final   Norovirus GI/GII NOT DETECTED NOT DETECTED Final   Rotavirus A NOT DETECTED NOT DETECTED Final   Sapovirus (I, II, IV, and V) NOT DETECTED NOT DETECTED Final     Scheduled Meds: . amiodarone  200 mg Oral BID  . atorvastatin  20 mg Oral Daily  . carbidopa-levodopa  1 tablet Oral QID  . chlorhexidine  15 mL Mouth Rinse BID  . docusate sodium  100 mg Oral BID  . escitalopram  20 mg Oral Daily  . feeding supplement (ENSURE ENLIVE)  237 mL Oral TID BM  . hydrALAZINE  10 mg Oral BID  . insulin aspart  0-9 Units Subcutaneous TID WC  . insulin glargine  5 Units Subcutaneous QHS  . isosorbide mononitrate  30 mg Oral Daily  . mouth rinse  15 mL Mouth Rinse q12n4p  . metoprolol tartrate  100 mg Oral BID  . multivitamin  15 mL Oral Daily  . saccharomyces boulardii  250 mg Oral Daily  . warfarin  1.5 mg Oral q1800  . Warfarin - Pharmacist Dosing Inpatient   Does not apply q1800    Assessment/Plan:  1. Acute kidney injury on chronic kidney disease stage IV. Nephrology following. Patient's altered mental status could be from uremia. Palliative care consultation appreciated. Nephrology consultation appreciated. 2. Hyperkalemia secondary to acute kidney injury.  Ordered another dose of Kayexalate today 3. Acute respiratory failure. Tapered off oxygen. 4. Acute encephalopathy. Discontinue Seroquel since she is still groggy after lunch. Discontinue tramadol and gabapentin which also can cause altered mental status. 5. Pneumonia. Received 5 days of antibiotic and then discontinued since pro calcitonin was negative. 6. History of Parkinson's on  Sinemet 7. Type 2 diabetes mellitus. Continue Lantus 5 units subcutaneous injection daily and sliding scale. Watch closely with worsening kidney function 8. Acute on chronic systolic congestive heart failure. Holding Lasix with overdiuresis. Continue metoprolol. No ACE inhibitor or arm secondary to acute kidney injury. 9. Atrial fibrillation with rapid ventricular rate on metoprolol and amiodarone at this point with better control of heart rate. INR still elevated 2.85. Coumadin has been on hold for while but pharmacy will dose. 10. Chronic anemia. Recent GI bleed. 11. Hyperlipidemia unspecified on atorvastatin 12. History of DVT on Coumadin. Coumadin level on hold secondary to high INR 13. Palliative care consultation appreciated  Code Status:     Code Status Orders        Start     Ordered   04/10/17 0328  Full code  Continuous     04/10/17 0327    Code Status History    Date Active Date Inactive Code Status Order ID Comments User Context   04/03/2017 11:30 PM 04/09/2017 10:09 PM Full Code [id number]  Hugelmeyer, Jon Gills, DO ED   02/15/2017  6:16 PM 02/16/2017  6:18 PM Full Code [id number]  Altamese Dilling, MD Inpatient   01/26/2017  2:44 PM 01/29/2017  9:47 PM Full Code [id number]  Alford Highland, MD ED   12/28/2016  7:44 AM 12/29/2016  4:34 PM DNR [id number]  Delfino Lovett, MD Inpatient   12/27/2016 11:06 PM 12/28/2016  7:44 AM Full Code [id number]  Hugelmeyer, Jon Gills, DO Inpatient    Advance Directive Documentation     Most Recent Value  Type of Advance Directive  Healthcare Power of Attorney  Pre-existing out of facility DNR order (yellow form or pink MOST form)  -  "MOST" Form in Place?  -     Family Communication: left message for daughter Disposition Plan: TBD  Time spent: 24 minutes  Alford Highland  [employer]

## 2017-04-20 NOTE — Progress Notes (Signed)
Daily Progress Note   Patient Name: Molly Wolf       Date: 04/20/2017 DOB: 03/07/1941  Age: 76 y.o. MRN#: [id number] Attending Physician: Loletha Grayer, MD Primary Care Physician: Suann Larry, MD Admit Date: 04/09/2017  Reason for Consultation/Follow-up: Establishing goals of care  Subjective: Patient had no complaints this morning.   Met with her grandson Molly Wolf [facility]) to discuss goals of care.  The concepts of aggressive medical care vs a comfort/hospice care path were explained.  We discussed her health and comorbidities with emphasis on her declining renal function and how it impacts her CHF. We discussed whether or not she would want hemo-dialysis.  Molly Wolf wanted to consider hemo-dialysis further before making a decision.    At this point in time, Molly Wolf prefers that she remain a full code. He reports that they will likely send her to a nursing facility after discharge, if she is unable to walk a few steps or transfer to the potty. He has a girlfriend who can help care for her at home when she is ready to return to his home. She will need to be able to transfer from a wheel chair to be able to live with her family. He has declined hospice care at this point in her care. The grandson reports that her memory has recently started to decline, and that her current mental state is not her normal.   Assessment: 76 year old female with declining renal function, CHF and parkinson's disease. Family would like to maintain full code status at this time.     Patient Profile/HPI:  76 y.o. female  with past medical history of parkinsons, CKD stg 4, COPD, mixed CHF (EF 35%), CAD s/p PCI, Afib, PAH, recent GIB who was admitted on 04/09/2017 with hypoxia, pulmonary edema and a syncopal episode at her  grandsons house.  The patient was admitted from 6/23 - 6/29 for GIB and CHF.  She was discharged to her grandson's house for care.  Shortly after she became non-responsive.  Her grandson performed CPR and EMS was called.  During this admission she was found to have HCAP and CHF exacerbation along with acute on chronic kidney failure and decreased urine output.  She is being followed by cardiology and nephrology.  She has had 5 admissions in 6 months.  Length of Stay: 10  Current Medications: Scheduled Meds:  . amiodarone  200 mg Oral BID  . atorvastatin  20 mg Oral Daily  . carbidopa-levodopa  1 tablet Oral QID  . chlorhexidine  15 mL Mouth Rinse BID  . docusate sodium  100 mg Oral BID  . escitalopram  20 mg Oral Daily  . feeding supplement (ENSURE ENLIVE)  237 mL Oral TID BM  . gabapentin  100 mg Oral Daily  . hydrALAZINE  10 mg Oral BID  . insulin aspart  0-9 Units Subcutaneous TID WC  . insulin glargine  5 Units Subcutaneous QHS  . isosorbide mononitrate  30 mg Oral Daily  . mouth rinse  15 mL Mouth Rinse q12n4p  . metoprolol tartrate  100 mg Oral BID  . multivitamin  15 mL Oral Daily  . QUEtiapine  12.5 mg Oral QHS  . saccharomyces boulardii  250 mg Oral Daily  . warfarin  1.5 mg Oral q1800  . Warfarin - Pharmacist Dosing Inpatient   Does not apply q1800    Continuous Infusions: . sodium chloride      PRN Meds: acetaminophen **OR** acetaminophen, bisacodyl, loratadine, LORazepam, nitroGLYCERIN, ondansetron **OR** ondansetron (ZOFRAN) IV, polyethylene glycol, traMADol  Physical Exam    Patient appears to be lying comfortably in bed. Well developed, well nourished. No pain noted on physical exam.  Patient had dry mucous membranes. Alert with some confusion.   Vital Signs: BP 135/87 (BP Location: Right Arm)   Pulse 70   Temp 97.7 F (36.5 C) (Oral)   Resp 18   Ht 5' 7"  (1.702 m)   Wt 69.9 kg (154 lb)   SpO2 99%   BMI 24.12 kg/m  SpO2: SpO2: 99 % O2 Device: O2  Device: Not Delivered O2 Flow Rate: O2 Flow Rate (L/min): 2 L/min  Intake/output summary:  Intake/Output Summary (Last 24 hours) at 04/20/17 5643 Last data filed at 04/19/17 1826  Gross per 24 hour  Intake              577 ml  Output              600 ml  Net              -23 ml   LBM: Last BM Date: 04/19/17 Baseline Weight: Weight: 68 kg (150 lb) Most recent weight: Weight: 69.9 kg (154 lb)       Palliative Assessment/Data:    Flowsheet Rows     Most Recent Value  Intake Tab  Referral Department  Hospitalist  Unit at Time of Referral  ICU  Palliative Care Primary Diagnosis  Nephrology  Date Notified  04/19/17  Palliative Care Type  New Palliative care  Reason for referral  Clarify Goals of Care  Date of Admission  04/09/17  Date first seen by Palliative Care  04/19/17  # of days Palliative referral response time  0 Day(s)  # of days IP prior to Palliative referral  10  Clinical Assessment  Palliative Performance Scale Score  30%  Pain Max last 24 hours  10  Psychosocial & Spiritual Assessment  Palliative Care Outcomes  Patient/Family meeting held?  Yes  Who was at the meeting?  patient, brother Laverna Peace and sister in law  Palliative Care Outcomes  Clarified goals of care      Patient Active Problem List   Diagnosis Date Noted  . Acute renal failure with acute tubular necrosis superimposed on stage 4 chronic kidney disease ([place])   .  Palliative care encounter   . Acute combined systolic and diastolic heart failure ([place])   . Acute respiratory failure with hypoxia ([place]) 04/10/2017  . Acute combined systolic (congestive) and diastolic (congestive) heart failure ([place]) 04/03/2017  . Coronary artery disease of native artery of native heart with stable angina pectoris ([place]) 03/31/2017  . Paroxysmal atrial fibrillation ([place]) 03/31/2017  . Chronic diastolic heart failure ([place]) 02/26/2017  . HTN (hypertension) 02/26/2017  . Parkinson disease ([place]) 02/26/2017  . Intractable  pain 12/27/2016    Palliative Care Plan    Recommendations/Plan:  Continue medical management. FULL code status maintained.   PMT will continue conversations with Molly Wolf and the family regarding code status, discharge, and post hospital support.  Goals of Care and Additional Recommendations:  Limitations on Scope of Treatment: Full Scope Treatment  Code Status:  Full code  Prognosis:   < 3 months given recurrent heart failure exacerbations combined with deteriorating kidney function.   Discharge Planning:  [place] for rehab with Palliative care service follow-up  Care plan was discussed with Patient's grandson, Molly Wolf [facility])   Thank you for allowing the Palliative Medicine Team to assist in the care of this patient.  Total time spent:  35 min      Greater than 50%  of this time was spent counseling and coordinating care related to the above assessment and plan.  Maddie Tedrow PA-S Florentina Jenny, PA-C Palliative Medicine  Please contact Palliative MedicineTeam phone at [phone] for questions and concerns between 7 am - 7 pm.   Please see AMION for individual provider pager numbers.

## 2017-04-20 NOTE — Progress Notes (Signed)
   The patient remains in rate-controlled atrial fibrillation by review of telemetry with HR in the 70's - 80's. She remains on Amiodarone 200mg  BID and this should be decreased to 200mg  daily at the time of hospital discharge.   Continue treatment of her CHF with Lopressor, Hydralazine, and Imdur as outlined by Dr. Kirke CorinArida on 7/9. Diuresis on hold secondary to worsening kidney function. No new recommendations at this time.  Signed, Ellsworth LennoxBrittany M Ilo Beamon, PA-C 04/20/2017, 12:38 PM

## 2017-04-20 NOTE — Plan of Care (Signed)
Problem: Pain Managment: Goal: General experience of comfort will improve Outcome: Progressing No complaints of pain this shift, will continue to monitor.  Problem: Tissue Perfusion: Goal: Risk factors for ineffective tissue perfusion will decrease Outcome: Progressing Coumadin on hold due to high PT & INR.

## 2017-04-20 NOTE — Progress Notes (Signed)
Inpatient Diabetes Program Recommendations  AACE/ADA: New Consensus Statement on Inpatient Glycemic Control (2015)  Target Ranges:  Prepandial:   less than 140 mg/dL      Peak postprandial:   less than 180 mg/dL (1-2 hours)      Critically ill patients:  140 - 180 mg/dL   Lab Results  Component Value Date   GLUCAP 218 (H) 04/19/2017   HGBA1C 7.5 (H) 04/04/2017    Review of Glycemic Control  Results for [name], Molly (MRN [id number]) as of 04/20/2017 12:39  Ref. Range 04/19/2017 11:49 04/19/2017 16:37 04/19/2017 21:15 04/20/2017 07:44 04/20/2017 11:28  Glucose-Capillary Latest Ref Range: 65 - 99 mg/dL 914145 (H) 782148 (H) 956218 (H) 195 (H) 212 (H)    Diabetes history: Type 2 Outpatient Diabetes medications: Lantus 40 units qhs, Humalog 6 units tid (from medical record) Current orders for Inpatient glycemic control: Lantus 5 units qday, Novolog 0-9 units tid  Inpatient Diabetes Program Recommendations:  Consider increasing Lantus to 6 units qhs  Susette RacerJulie Virgina Deakins, RN, [place], [place]HA, CDE Diabetes Coordinator Inpatient Diabetes Program  [phone] (Team Pager) [phone] [facility] Office) 04/20/2017 12:44 PM

## 2017-04-20 NOTE — Clinical Social Work Note (Signed)
Patient has a bed at [facility] [place], they can accept patient once she is medically ready for discharge and orders have been received.  Ervin KnackEric R. Rigel Filsinger, MSW, Theresia MajorsLCSWA [phone]  04/20/2017 10:11 AM

## 2017-04-20 NOTE — Progress Notes (Signed)
[facility], [place] 04/20/17  Subjective:   Very lethargic today.  Did not recall an incident questions Serum creatinine and BUN are worse today Potassium increased to 5.5  Objective:  Vital signs in last 24 hours:  Temp:  [97.7 F (36.5 C)-98 F (36.7 C)] 97.7 F (36.5 C) (07/10 1126) Pulse Rate:  [66-83] 83 (07/10 1126) Resp:  [17-18] 17 (07/10 1126) BP: (114-135)/(67-87) 120/80 (07/10 1126) SpO2:  [96 %-100 %] 98 % (07/10 1126) Weight:  [69.9 kg (154 lb)] 69.9 kg (154 lb) (07/10 0500)  Weight change: -5.443 kg (-12 lb) Filed Weights   04/18/17 0335 04/19/17 0500 04/20/17 0500  Weight: 74.8 kg (165 lb) 75.3 kg (166 lb) 69.9 kg (154 lb)    Intake/Output:    Intake/Output Summary (Last 24 hours) at 04/20/17 1605 Last data filed at 04/19/17 1826  Gross per 24 hour  Intake              237 ml  Output                0 ml  Net              237 ml     Physical Exam: General: Laying in the bed, no acute distress  HEENT anicteric  Neck supple  Pulm/lungs Normal breathing, mild rhonchi at bases  CVS/Heart irregular  Abdomen:  Soft, mild diffuse tenderness  Extremities: Trace edema  Neurologic: Lethargic today  Skin: Scattered ecchymosis          Basic Metabolic Panel:   Recent Labs Lab 04/17/17 0457 04/18/17 0318 04/18/17 0919 04/19/17 0632 04/20/17 0517  NA 141 140 140 139 138  K 5.3* 5.7* 5.3* 5.2* 5.5*  CL 105 104 104 103 101  CO2 28 28 27 28 28   GLUCOSE 113* 174* 171* 200* 244*  BUN 62* 72* 74* 77* 84*  CREATININE 3.52* 3.84* 4.11* 3.90* 4.18*  CALCIUM 8.6* 8.6* 8.9 8.4* 8.4*  PHOS  --   --   --  4.0  --      CBC:  Recent Labs Lab 04/14/17 0456 04/16/17 0443 04/18/17 0934 04/19/17 0636 04/20/17 0517  WBC 5.0 4.4 6.1 4.9  --   HGB 9.3* 9.2* 9.9* 9.4* 9.3*  HCT 27.9* 28.1* 30.3* 28.8*  --   MCV 85.7 86.3 86.2 86.9  --   PLT 142* 138* 150 138*  --      No results found for: HEPBSAG, HEPBSAB,  HEPBIGM    Microbiology:  Recent Results (from the past 240 hour(s))  C difficile quick scan w PCR reflex     Status: None   Collection Time: 04/17/17  2:50 PM  Result Value Ref Range Status   C Diff antigen NEGATIVE NEGATIVE Final   C Diff toxin NEGATIVE NEGATIVE Final   C Diff interpretation No C. difficile detected.  Final  Gastrointestinal Panel by PCR , Stool     Status: None   Collection Time: 04/17/17  2:50 PM  Result Value Ref Range Status   Campylobacter species NOT DETECTED NOT DETECTED Final   Plesimonas shigelloides NOT DETECTED NOT DETECTED Final   Salmonella species NOT DETECTED NOT DETECTED Final   Yersinia enterocolitica NOT DETECTED NOT DETECTED Final   Vibrio species NOT DETECTED NOT DETECTED Final   Vibrio cholerae NOT DETECTED NOT DETECTED Final   Enteroaggregative E coli (EAEC) NOT DETECTED NOT DETECTED Final   Enteropathogenic E coli (EPEC) NOT DETECTED NOT DETECTED Final  Enterotoxigenic E coli (ETEC) NOT DETECTED NOT DETECTED Final   Shiga like toxin producing E coli (STEC) NOT DETECTED NOT DETECTED Final   Shigella/Enteroinvasive E coli (EIEC) NOT DETECTED NOT DETECTED Final   Cryptosporidium NOT DETECTED NOT DETECTED Final   Cyclospora cayetanensis NOT DETECTED NOT DETECTED Final   Entamoeba histolytica NOT DETECTED NOT DETECTED Final   Giardia lamblia NOT DETECTED NOT DETECTED Final   Adenovirus F40/41 NOT DETECTED NOT DETECTED Final   Astrovirus NOT DETECTED NOT DETECTED Final   Norovirus GI/GII NOT DETECTED NOT DETECTED Final   Rotavirus A NOT DETECTED NOT DETECTED Final   Sapovirus (I, II, IV, and V) NOT DETECTED NOT DETECTED Final    Coagulation Studies:  Recent Labs  04/18/17 0318 04/19/17 0632 04/20/17 0517  LABPROT 47.7* 49.8* 30.5*  INR 4.98* 5.26* 2.85    Urinalysis: No results for input(s): COLORURINE, LABSPEC, PHURINE, GLUCOSEU, HGBUR, BILIRUBINUR, KETONESUR, PROTEINUR, UROBILINOGEN, NITRITE, LEUKOCYTESUR in the last 72  hours.  Invalid input(s): APPERANCEUR    Imaging: No results found.   Medications:   . sodium chloride     . amiodarone  200 mg Oral BID  . carbidopa-levodopa  1 tablet Oral QID  . chlorhexidine  15 mL Mouth Rinse BID  . docusate sodium  100 mg Oral BID  . escitalopram  20 mg Oral Daily  . feeding supplement (ENSURE ENLIVE)  237 mL Oral TID BM  . hydrALAZINE  10 mg Oral BID  . insulin aspart  0-9 Units Subcutaneous TID WC  . insulin glargine  5 Units Subcutaneous QHS  . isosorbide mononitrate  30 mg Oral Daily  . mouth rinse  15 mL Mouth Rinse q12n4p  . metoprolol tartrate  100 mg Oral BID  . multivitamin  15 mL Oral Daily  . saccharomyces boulardii  250 mg Oral Daily  . warfarin  1.5 mg Oral q1800  . Warfarin - Pharmacist Dosing Inpatient   Does not apply q1800   acetaminophen **OR** acetaminophen, bisacodyl, loratadine, LORazepam, nitroGLYCERIN, ondansetron **OR** ondansetron (ZOFRAN) IV, polyethylene glycol  Assessment/ Plan:  76 y.o. caucsian  female with coronary artery disease, congestive heart failure, pulmonary hypertension, parkinson's, diabetes mellitus type II, COPD, DVT on warfarin, atrial fibrillation, hyperlipidemia, anemia, anxiety, patellar fracture,  1. Acute renal failure with hyperkalemia on chronic kidney disease stage IV with proteinuria.  Baseline creatinine of 2.5, GFR of 17.  Chronic kidney disease secondary to hypertension and diabetes Acute renal failure secondary to overdiuresis and hypotension leading to ATN.  No IV contrast exposure.  - agree with holding diuretics - serum creatinine and BUN continued to worsen - with lethargy, it is possible uremia is playing a role - continue palliative care discussions to clarify goals of care. - If patient starts dialysis, given poor baseline renal function,it is possible that this might be permanent.  - continue to optimize blood pressure, volume status.  Will monitor  2. Diabetes mellitus type II with  chronic kidney disease: insulin dependent. Hemoglobin A1c 7.5% on 6/24  3. Hyperkalemia - agree with SPS   LOS: 10 [facility] CenterINGH,Jasiya Markie 7/10/20184:05 PM  [facility] [place], [place] [phone]

## 2017-04-20 NOTE — Care Management (Signed)
Patient and her grandson wish for patient to remain full code and proceed to skilled nursing facility when medically stable.  Physical therapy was abe to provide treatment yesterday due to INR of 5.26.  INR 2.85 today.

## 2017-04-20 NOTE — Progress Notes (Signed)
Patient remains pleasantly confused,remains on room air

## 2017-04-20 NOTE — Progress Notes (Signed)
ANTICOAGULATION CONSULT NOTE - follow up  Pharmacy Consult for warfarin Indication: atrial fibrillation  No Known Allergies  Patient Measurements: Height: 5' 7"  (170.2 cm) Weight: 154 lb (69.9 kg) IBW/kg (Calculated) : 61.6 Heparin Dosing Weight:   Vital Signs: Temp: 97.7 F (36.5 C) (07/10 0735) Temp Source: Oral (07/10 0735) BP: 135/87 (07/10 0735) Pulse Rate: 70 (07/10 0735)  Labs:  Recent Labs  04/18/17 0318 04/18/17 0919  04/18/17 5366 04/19/17 4403 04/19/17 0636 04/20/17 0517  HGB  --   --   < > 9.9*  --  9.4* 9.3*  HCT  --   --   --  30.3*  --  28.8*  --   PLT  --   --   --  150  --  138*  --   LABPROT 47.7*  --   --   --  49.8*  --  30.5*  INR 4.98*  --   --   --  5.26*  --  2.85  CREATININE 3.84* 4.11*  --   --  3.90*  --  4.18*  < > = values in this interval not displayed.  Estimated Creatinine Clearance: 11 mL/min (A) (by C-G formula based on SCr of 4.18 mg/dL (H)).   Medical History: Past Medical History:  Diagnosis Date  . Anxiety   . Arthritis   . Chronic combined systolic and diastolic CHF (congestive heart failure) ([place])    a. 01/2008 Echo ([facility]): EF > 55%, mod-sev LVH w/ grade 1 DD, biatrial enlargement, mild AS, trace MR/TR; b. EF 45-50%, GR2DD, mild to moderate aortic stenosis, mild aortic insufficieny, mild to moderate mitral regurgitation, mild tricuspid regurgitation, mild biatrial enlargement, and a small pericardial effusion  . CKD (chronic kidney disease), stage IV ([place])   . Closed patellar sleeve fracture of right knee    a. 01/2017 - conservatively managed.  Marland Kitchen COPD (chronic obstructive pulmonary disease) ([place])   . Coronary artery disease    a. s/p remote stenting of unknown vessel @ [facility].  . Depression   . Diabetes mellitus with complication ([place])   . DVT (deep venous thrombosis) ([facility])    a. in the setting of right patellar fracture on Coumadin  . Hyperlipidemia   . Hypertension   . Parkinson disease ([place])     Medications:   Prescriptions Prior to Admission  Medication Sig Dispense Refill Last Dose  . acetaminophen (TYLENOL) 325 MG tablet Take 650 mg by mouth every 6 (six) hours as needed for mild pain.   prn at prn  . atorvastatin (LIPITOR) 20 MG tablet Take 20 mg by mouth daily.   Past Week at Unknown time  . blood glucose meter kit and supplies KIT Dispense based on patient and insurance preference. Use up to four times daily as directed. (FOR ICD-9 250.00, 250.01). 1 each 0 Past Week at Unknown time  . Carbidopa-Levodopa ER (SINEMET CR) 25-100 MG tablet controlled release Take 2 tablets by mouth 4 (four) times daily. 240 tablet 1 Past Week at Unknown time  . Coenzyme Q10 (COQ10) 50 MG CAPS Take 50 mg by mouth daily.   Past Week at Unknown time  . CRANBERRY PO Take 1 tablet by mouth daily.   Past Week at Unknown time  . escitalopram (LEXAPRO) 20 MG tablet Take 20 mg by mouth daily.   Past Week at Unknown time  . furosemide (LASIX) 20 MG tablet Take 1 tablet (20 mg total) by mouth daily. 30 tablet 2 Past Week at Unknown  time  . gabapentin (NEURONTIN) 300 MG capsule Take 300 mg by mouth daily.    Past Week at Unknown time  . glucose blood (ACCU-CHEK AVIVA PLUS) test strip Use as instructed 100 each 12 Past Week at Unknown time  . hydrALAZINE (APRESOLINE) 25 MG tablet Take 1 tablet (25 mg total) by mouth 2 (two) times daily. 180 tablet 3 Past Week at Unknown time  . insulin glargine (LANTUS) 100 UNIT/ML injection Inject 40 Units into the skin at bedtime.    Past Week at Unknown time  . insulin lispro (HUMALOG) 100 UNIT/ML injection Inject 6 Units into the skin 3 (three) times daily with meals.    Past Week at Unknown time  . isosorbide mononitrate (IMDUR) 60 MG 24 hr tablet Take 1 tablet (60 mg total) by mouth daily. 30 tablet 2 Past Week at Unknown time  . loratadine (CLARITIN) 10 MG tablet Take 5 mg by mouth daily as needed for allergies.    prn at prn  . LORazepam (ATIVAN) 1 MG tablet Take 1 tablet (1 mg total) by  mouth 2 (two) times daily as needed for anxiety. 5 tablet 0 prn at prn  . metoprolol tartrate (LOPRESSOR) 50 MG tablet Take 1 tablet (50 mg total) by mouth 2 (two) times daily. 180 tablet 3 Past Week at Unknown time  . mirabegron ER (MYRBETRIQ) 25 MG TB24 tablet Take 25 mg by mouth daily.   Past Week at Unknown time  . nitroGLYCERIN (NITROSTAT) 0.4 MG SL tablet Place 0.4 mg under the tongue every 5 (five) minutes as needed for chest pain.   prn at prn  . Omega-3 Fatty Acids (FISH OIL) 1000 MG CAPS Take 1,000 mg by mouth daily.   Past Week at Unknown time  . polyethylene glycol (MIRALAX / GLYCOLAX) packet Take 17 g by mouth daily as needed for mild constipation.   prn at prn  . promethazine (PHENERGAN) 25 MG tablet Take 25 mg by mouth every 6 (six) hours as needed for nausea or vomiting.   prn at prn  . saccharomyces boulardii (FLORASTOR) 250 MG capsule Take 250 mg by mouth daily.   Past Week at Unknown time  . traMADol (ULTRAM) 50 MG tablet Take 1 tablet (50 mg total) by mouth daily as needed. (Patient taking differently: Take 50 mg by mouth daily as needed for moderate pain. ) 5 tablet 0 prn at prn  . warfarin (COUMADIN) 2 MG tablet Take 1 tablet (2 mg total) by mouth as directed. Every Sunday 30 tablet 0 Past Week at Unknown time  . warfarin (COUMADIN) 3 MG tablet Take 1 tablet (3 mg total) by mouth daily at 6 PM. Mon,tue,wed,thur,friday, sat 30 tablet 1 Past Week at Unknown time   Scheduled:  . amiodarone  200 mg Oral BID  . atorvastatin  20 mg Oral Daily  . carbidopa-levodopa  1 tablet Oral QID  . chlorhexidine  15 mL Mouth Rinse BID  . docusate sodium  100 mg Oral BID  . escitalopram  20 mg Oral Daily  . feeding supplement (ENSURE ENLIVE)  237 mL Oral TID BM  . gabapentin  100 mg Oral Daily  . hydrALAZINE  10 mg Oral BID  . insulin aspart  0-9 Units Subcutaneous TID WC  . insulin glargine  5 Units Subcutaneous QHS  . isosorbide mononitrate  30 mg Oral Daily  . mouth rinse  15 mL Mouth  Rinse q12n4p  . metoprolol tartrate  100 mg Oral BID  .  multivitamin  15 mL Oral Daily  . QUEtiapine  12.5 mg Oral QHS  . saccharomyces boulardii  250 mg Oral Daily  . sodium polystyrene  30 g Oral Once  . Warfarin - Pharmacist Dosing Inpatient   Does not apply q1800    Assessment: Pharmacy consulted to dose and monitor warfarin therapy in this 76 year old patient chronically on warfarin for Afib.  Home dose: 3 mg daily except 2 mg on sundays.   7/5 INR 2.59 Warfarin 3 mg 7/6 INR 3.20   Dose Held 7/7 INR 4.29 Dose Held 7/8 INR 4.98 Dose Held 7/9       INR 5.26 Dose Held 7/10     INR 2.85  Patient has been initiated on amiodarone 7/5  Goal of Therapy:  INR 2-3 Monitor platelets by anticoagulation protocol: Yes   Plan:  INR is now therapeutic. Will resume warfarin at about 50% of home dose due to drug interaction with amiodarone. Will start warfarin 1.63m daily. Check INR in the AM along with a CBC.  MRamond Dial Pharm.D, BCPS Clinical Pharmacist  04/20/2017,7:43 AM

## 2017-04-20 NOTE — Progress Notes (Signed)
PT Cancellation Note  Patient Details Name: Molly Wolf MRN: [id number] DOB: 06-09-1941   Cancelled Treatment:    Reason Eval/Treat Not Completed: Patient not medically ready. Per chart review, potassium level is 5.5 this date, contraindicating PT. Re attempt at a later date when pt medically stable.    Scot DockHeidi E Barnes, PTA 04/20/2017, 11:16 AM

## 2017-04-21 LAB — CBC
HCT: 29.9 % — ABNORMAL LOW (ref 35.0–47.0)
Hemoglobin: 9.9 g/dL — ABNORMAL LOW (ref 12.0–16.0)
MCH: 29 pg (ref 26.0–34.0)
MCHC: 33.1 g/dL (ref 32.0–36.0)
MCV: 87.8 fL (ref 80.0–100.0)
PLATELETS: 152 10*3/uL (ref 150–440)
RBC: 3.41 MIL/uL — ABNORMAL LOW (ref 3.80–5.20)
RDW: 16.4 % — AB (ref 11.5–14.5)
WBC: 5.8 10*3/uL (ref 3.6–11.0)

## 2017-04-21 LAB — BASIC METABOLIC PANEL
ANION GAP: 9 (ref 5–15)
BUN: 90 mg/dL — ABNORMAL HIGH (ref 6–20)
CO2: 29 mmol/L (ref 22–32)
CREATININE: 4.51 mg/dL — AB (ref 0.44–1.00)
Calcium: 8.6 mg/dL — ABNORMAL LOW (ref 8.9–10.3)
Chloride: 103 mmol/L (ref 101–111)
GFR, EST AFRICAN AMERICAN: 10 mL/min — AB (ref >60)
GFR, EST NON AFRICAN AMERICAN: 9 mL/min — AB (ref >60)
GLUCOSE: 134 mg/dL — AB (ref 65–99)
Potassium: 5.4 mmol/L — ABNORMAL HIGH (ref 3.5–5.1)
Sodium: 141 mmol/L (ref 135–145)

## 2017-04-21 LAB — PROTIME-INR
INR: 2.49
Prothrombin Time: 27.4 seconds — ABNORMAL HIGH (ref 11.4–15.2)

## 2017-04-21 LAB — GLUCOSE, CAPILLARY
Glucose-Capillary: 124 mg/dL — ABNORMAL HIGH (ref 65–99)
Glucose-Capillary: 214 mg/dL — ABNORMAL HIGH (ref 65–99)
Glucose-Capillary: 206 mg/dL — ABNORMAL HIGH (ref 65–99)
Glucose-Capillary: 117 mg/dL — ABNORMAL HIGH (ref 65–99)

## 2017-04-21 MED ORDER — CEFAZOLIN SODIUM-DEXTROSE 1-4 GM/50ML-% IV SOLN
1.0000 g | INTRAVENOUS | Status: AC
  Administered 2017-04-22: 1 g via INTRAVENOUS
  Filled 2017-04-21: qty 50

## 2017-04-21 MED ORDER — NEPRO/CARBSTEADY PO LIQD
237.0000 mL | Freq: Three times a day (TID) | ORAL | Status: DC
  Administered 2017-04-21 – 2017-04-27 (×9): 237 mL via ORAL

## 2017-04-21 MED ORDER — SODIUM CHLORIDE 0.9 % IV SOLN
INTRAVENOUS | Status: DC
Start: 2017-04-21 — End: 2017-04-22
  Administered 2017-04-21: 08:00:00 via INTRAVENOUS

## 2017-04-21 MED ORDER — SODIUM POLYSTYRENE SULFONATE 15 GM/60ML PO SUSP
30.0000 g | Freq: Once | ORAL | Status: AC
  Administered 2017-04-21: 30 g via ORAL
  Filled 2017-04-21: qty 120

## 2017-04-21 NOTE — Progress Notes (Signed)
Daily Progress Note   Patient Name: Molly Wolf       Date: 04/21/2017 DOB: 04/26/1941  Age: 76 y.o. MRN#: [id number] Attending Physician: Alford HighlandWieting, Richard, MD Primary Care Physician: Darliss RidgelBergert, Molly W, MD Admit Date: 04/09/2017  Reason for Consultation/Follow-up: Establishing goals of care  Subjective: Patient complains of abdominal pain and confusion.  "I was just in another room, I can't keep up with what is going on" Per RN patient was not in another room.  RN mentions that patient is having cramping and diarrhea likely due to Kayexalate she has been receiving for high potassium  Molly Wolf, Molly RushingSeth, and his girlfriend Molly Wolf are at bedside.  They are the patient's primary care takers.  We discussed HD.  Molly RushingSeth comments that Dr. Renae GlossWieting is getting rid of some medications that may improve her kidney function.  He asked me to review her list of current medications and their indication - so we spent time jotting those down and discussing them.  I expressed to Molly RushingSeth that PT has been unable to work with Mrs. Molly Wolf due to confusion, weakness and an elevated potassium.  I explained that if she starts HD she will need to be on it permanently - he understands.  I committed to continue to update him on her progress.  Assessment: Worsening renal function, with potassium elevated despite giving kayexalate.  Patient's confusion waxes and wanes. She is very deconditioned.   Patient Profile/HPI: 76 y.o. female  with past medical history of parkinsons, CKD stg 4, COPD, mixed CHF (EF 35%), CAD s/p PCI, Afib, PAH, recent GIB who was admitted on 04/09/2017 with hypoxia, pulmonary edema and a syncopal episode at her grandsons house.  The patient was admitted from 6/23 - 6/29 for GIB and CHF.  She was discharged  to her grandson's house for care.  Shortly after she became non-responsive.  Her grandson performed CPR and EMS was called.  During this admission she was found to have HCAP and CHF exacerbation along with acute on chronic kidney failure and decreased urine output.  She is being followed by cardiology and nephrology.  She has had 5 admissions in 6 months.    Length of Stay: 11  Current Medications: Scheduled Meds:  . amiodarone  200 mg Oral BID  . carbidopa-levodopa  1 tablet Oral QID  . chlorhexidine  15 mL Mouth Rinse BID  . docusate sodium  100 mg Oral BID  . escitalopram  20 mg Oral Daily  . feeding supplement (NEPRO CARB STEADY)  237 mL Oral TID BM  . hydrALAZINE  10 mg Oral BID  . insulin aspart  0-9 Units Subcutaneous TID WC  . insulin glargine  5 Units Subcutaneous QHS  . isosorbide mononitrate  30 mg Oral Daily  . mouth rinse  15 mL Mouth Rinse q12n4p  . metoprolol tartrate  100 mg Oral BID  . multivitamin  15 mL Oral Daily  . saccharomyces boulardii  250 mg Oral Daily  . warfarin  1.5 mg Oral q1800  . Warfarin - Pharmacist Dosing Inpatient   Does not apply q1800    Continuous Infusions: . sodium chloride 40 mL/hr at 04/21/17 0813  . sodium chloride      PRN Meds: acetaminophen **OR** acetaminophen, bisacodyl, loratadine, LORazepam, nitroGLYCERIN, ondansetron **OR** ondansetron (ZOFRAN) IV, polyethylene glycol  Physical Exam        Well developed.  Confused.  Overall body tremor increased. CV rrr Resp no distress.  Not on oxygen Abdomen - mod tenderness in the right lower quadrant with some firmness present there.  No distention Extremities able to move all 4  Vital Signs: BP (!) 129/91 (BP Location: Left Arm)   Pulse 80   Temp 97.8 F (36.6 C) (Oral)   Resp 16   Ht 5\' 7"  (1.702 m)   Wt 73 kg (161 lb)   SpO2 96%   BMI 25.22 kg/m  SpO2: SpO2: 96 % O2 Device: O2 Device: Not Delivered O2 Flow Rate: O2 Flow Rate (L/min): 2 L/min  Intake/output summary:    Intake/Output Summary (Last 24 hours) at 04/21/17 1454 Last data filed at 04/20/17 2250  Gross per 24 hour  Intake                0 ml  Output              660 ml  Net             -660 ml   LBM: Last BM Date: 04/21/17 Baseline Weight: Weight: 68 kg (150 lb) Most recent weight: Weight: 73 kg (161 lb)       Palliative Assessment/Data:    Flowsheet Rows     Most Recent Value  Intake Tab  Referral Department  Hospitalist  Unit at Time of Referral  ICU  Palliative Care Primary Diagnosis  Nephrology  Date Notified  04/19/17  Palliative Care Type  New Palliative care  Reason for referral  Clarify Goals of Care  Date of Admission  04/09/17  Date first seen by Palliative Care  04/19/17  # of days Palliative referral response time  0 Day(s)  # of days IP prior to Palliative referral  10  Clinical Assessment  Palliative Performance Scale Score  30%  Pain Max last 24 hours  10  Psychosocial & Spiritual Assessment  Palliative Care Outcomes  Patient/Family meeting held?  Yes  Who was at the meeting?  patient, brother Molly Wolf and sister in law  Palliative Care Outcomes  Clarified goals of care      Patient Active Problem List   Diagnosis Date Noted  . Goals of care, counseling/discussion   . Acute renal failure with acute tubular necrosis superimposed on stage 4 chronic kidney disease (HCC)   . Palliative care encounter   . Acute combined systolic and diastolic heart failure (HCC)   .  Acute respiratory failure with hypoxia (HCC) 04/10/2017  . Acute combined systolic (congestive) and diastolic (congestive) heart failure (HCC) 04/03/2017  . Coronary artery disease of native artery of native heart with stable angina pectoris (HCC) 03/31/2017  . Paroxysmal atrial fibrillation (HCC) 03/31/2017  . Chronic diastolic heart failure (HCC) 02/26/2017  . HTN (hypertension) 02/26/2017  . Parkinson disease (HCC) 02/26/2017  . Intractable pain 12/27/2016    Palliative Care Plan     Recommendations/Plan:  Continue to educate the family and work on GOC.     I'm concerned that with her deconditioning and confusion - can she sit for outpatient HD if needed?  Will continue to discuss code status with family  Goals of Care and Additional Recommendations:  Limitations on Scope of Treatment: Full Scope Treatment  Code Status:  Full code  Prognosis:   < 3 months given advancing stage 4 kidney failure, CHF, PAH, Parkinsons, multiple Hospitalizations    Discharge Planning:  Skilled Nursing Facility for rehab with Palliative care service follow-up  Care plan was discussed with grandson and attending physician  Thank you for allowing the Palliative Medicine Team to assist in the care of this patient.  Total time spent:  35 min.     Greater than 50%  of this time was spent counseling and coordinating care related to the above assessment and plan.  Norvel Richards, PA-C Palliative Medicine  Please contact Palliative MedicineTeam phone at [phone] for questions and concerns between 7 am - 7 pm.   Please see AMION for individual provider pager numbers.

## 2017-04-21 NOTE — Progress Notes (Signed)
Patient ID: Molly Wolf, female   DOB: Jul 14, 1941, 76 y.o.   MRN: [id number]   [facility] PROGRESS NOTE  Syerra Abdelrahman ONG:[id number] DOB: 12/17/1940 DOA: 04/09/2017 PCP: Darliss Ridgel, MD  HPI/Subjective: Patient was seen this morning and was more alert than yesterday. She answered questions appropriately and was actually feeling a little bit better. In speaking with the palliative care physician later in the day she was more confused.  Objective: Vitals:   04/21/17 0328 04/21/17 0721  BP: 123/80 (!) 129/91  Pulse: 78 80  Resp:  16  Temp: 97.7 F (36.5 C) 97.8 F (36.6 C)    Filed Weights   04/19/17 0500 04/20/17 0500 04/21/17 0500  Weight: 75.3 kg (166 lb) 69.9 kg (154 lb) 73 kg (161 lb)    ROS: Review of Systems  Constitutional: Negative for chills and fever.  Eyes: Negative for blurred vision.  Respiratory: Negative for cough and shortness of breath.   Cardiovascular: Negative for chest pain.  Gastrointestinal: Positive for abdominal pain. Negative for constipation, diarrhea, nausea and vomiting.  Genitourinary: Negative for dysuria.  Musculoskeletal: Negative for joint pain.  Neurological: Negative for dizziness and headaches.  Limited with review of systems Exam: Physical Exam  HENT:  Nose: No mucosal edema.  Mouth/Throat: No oropharyngeal exudate or posterior oropharyngeal edema.  Eyes: Conjunctivae, EOM and lids are normal. Pupils are equal, round, and reactive to light.  Neck: No JVD present. Carotid bruit is not present. No edema present. No thyroid mass and no thyromegaly present.  Cardiovascular: S1 normal and S2 normal.  Exam reveals no gallop.   No murmur heard. Pulses:      Dorsalis pedis pulses are 2+ on the right side, and 2+ on the left side.  Respiratory: No respiratory distress. She has decreased breath sounds in the right lower field and the left lower field. She has no wheezes. She has no rhonchi. She has no rales.  GI: Soft. Bowel sounds  are normal. There is no tenderness.  Musculoskeletal:       Right ankle: She exhibits no swelling.       Left ankle: She exhibits no swelling.  Lymphadenopathy:    She has no cervical adenopathy.  Neurological: She is alert. No cranial nerve deficit.  Skin: Skin is warm. No rash noted. Nails show no clubbing.  Psychiatric: She has a normal mood and affect.      Data Reviewed: Basic Metabolic Panel:  Recent Labs Lab 04/18/17 0318 04/18/17 0919 04/19/17 0632 04/20/17 0517 04/21/17 0327  NA 140 140 139 138 141  K 5.7* 5.3* 5.2* 5.5* 5.4*  CL 104 104 103 101 103  CO2 28 27 28 28 29   GLUCOSE 174* 171* 200* 244* 134*  BUN 72* 74* 77* 84* 90*  CREATININE 3.84* 4.11* 3.90* 4.18* 4.51*  CALCIUM 8.6* 8.9 8.4* 8.4* 8.6*  PHOS  --   --  4.0  --   --    CBC:  Recent Labs Lab 04/16/17 0443 04/18/17 0934 04/19/17 0636 04/20/17 0517 04/21/17 0327  WBC 4.4 6.1 4.9  --  5.8  HGB 9.2* 9.9* 9.4* 9.3* 9.9*  HCT 28.1* 30.3* 28.8*  --  29.9*  MCV 86.3 86.2 86.9  --  87.8  PLT 138* 150 138*  --  152   BNP (last 3 results)  Recent Labs  01/26/17 1112 04/03/17 1918 04/05/17 0453  BNP 2,049.0* 1,139.0* 980.0*     CBG:  Recent Labs Lab 04/20/17 1634 04/20/17 1842 04/20/17 2127  04/21/17 0745 04/21/17 1157  GLUCAP 60* 84 79 124* 214*    Recent Results (from the past 240 hour(s))  C difficile quick scan w PCR reflex     Status: None   Collection Time: 04/17/17  2:50 PM  Result Value Ref Range Status   C Diff antigen NEGATIVE NEGATIVE Final   C Diff toxin NEGATIVE NEGATIVE Final   C Diff interpretation No C. difficile detected.  Final  Gastrointestinal Panel by PCR , Stool     Status: None   Collection Time: 04/17/17  2:50 PM  Result Value Ref Range Status   Campylobacter species NOT DETECTED NOT DETECTED Final   Plesimonas shigelloides NOT DETECTED NOT DETECTED Final   Salmonella species NOT DETECTED NOT DETECTED Final   Yersinia enterocolitica NOT DETECTED NOT  DETECTED Final   Vibrio species NOT DETECTED NOT DETECTED Final   Vibrio cholerae NOT DETECTED NOT DETECTED Final   Enteroaggregative E coli (EAEC) NOT DETECTED NOT DETECTED Final   Enteropathogenic E coli (EPEC) NOT DETECTED NOT DETECTED Final   Enterotoxigenic E coli (ETEC) NOT DETECTED NOT DETECTED Final   Shiga like toxin producing E coli (STEC) NOT DETECTED NOT DETECTED Final   Shigella/Enteroinvasive E coli (EIEC) NOT DETECTED NOT DETECTED Final   Cryptosporidium NOT DETECTED NOT DETECTED Final   Cyclospora cayetanensis NOT DETECTED NOT DETECTED Final   Entamoeba histolytica NOT DETECTED NOT DETECTED Final   Giardia lamblia NOT DETECTED NOT DETECTED Final   Adenovirus F40/41 NOT DETECTED NOT DETECTED Final   Astrovirus NOT DETECTED NOT DETECTED Final   Norovirus GI/GII NOT DETECTED NOT DETECTED Final   Rotavirus A NOT DETECTED NOT DETECTED Final   Sapovirus (I, II, IV, and V) NOT DETECTED NOT DETECTED Final     Scheduled Meds: . amiodarone  200 mg Oral BID  . carbidopa-levodopa  1 tablet Oral QID  . chlorhexidine  15 mL Mouth Rinse BID  . docusate sodium  100 mg Oral BID  . escitalopram  20 mg Oral Daily  . feeding supplement (NEPRO CARB STEADY)  237 mL Oral TID BM  . hydrALAZINE  10 mg Oral BID  . insulin aspart  0-9 Units Subcutaneous TID WC  . insulin glargine  5 Units Subcutaneous QHS  . isosorbide mononitrate  30 mg Oral Daily  . mouth rinse  15 mL Mouth Rinse q12n4p  . metoprolol tartrate  100 mg Oral BID  . multivitamin  15 mL Oral Daily  . saccharomyces boulardii  250 mg Oral Daily  . warfarin  1.5 mg Oral q1800  . Warfarin - Pharmacist Dosing Inpatient   Does not apply q1800    Assessment/Plan:  1. Acute kidney injury on chronic kidney disease stage IV.  Case discussed with nephrology and they will initiate dialysis since creatinine continues to worsen and we are having problems with hyperkalemia 2. Hyperkalemia secondary to acute kidney injury.  Ordered  another dose of Kayexalate today. An sure switched over to Nepro 3. Acute respiratory failure. Tapered off oxygen. 4. Acute encephalopathy. Continue to be careful with any medications that can cause altered mental status. 5. Pneumonia. Received 5 days of antibiotic and then discontinued since pro calcitonin was negative. 6. History of Parkinson's on Sinemet 7. Type 2 diabetes mellitus. Continue Lantus 5 units subcutaneous injection daily and sliding scale. Watch closely with worsening kidney function 8. Acute on chronic systolic congestive heart failure. Holding Lasix with overdiuresis. Continue metoprolol. No ACE inhibitor or arm secondary to acute kidney injury. 9.  Atrial fibrillation with rapid ventricular rate on metoprolol and amiodarone at this point with better control of heart rate. INR still elevated 2.85. Coumadin has been on hold for while but pharmacy will dose. 10. Chronic anemia. Recent GI bleed. 11. Hyperlipidemia unspecified on atorvastatin 12. History of DVT on Coumadin. Coumadin level on hold secondary to high INR 13. Palliative care consultation appreciated   Code Status:     Code Status Orders        Start     Ordered   04/10/17 0328  Full code  Continuous     04/10/17 0327    Code Status History    Date Active Date Inactive Code Status Order ID Comments User Context   04/03/2017 11:30 PM 04/09/2017 10:09 PM Full Code [id number]  Hugelmeyer, Jon GillsAlexis, DO ED   02/15/2017  6:16 PM 02/16/2017  6:18 PM Full Code [id number]  Altamese DillingVachhani, Vaibhavkumar, MD Inpatient   01/26/2017  2:44 PM 01/29/2017  9:47 PM Full Code [id number]  Alford HighlandWieting, Moody Robben, MD ED   12/28/2016  7:44 AM 12/29/2016  4:34 PM DNR [id number]  Delfino LovettShah, Vipul, MD Inpatient   12/27/2016 11:06 PM 12/28/2016  7:44 AM Full Code [id number]  Hugelmeyer, Jon GillsAlexis, DO Inpatient    Advance Directive Documentation     Most Recent Value  Type of Advance Directive  Healthcare Power of Attorney  Pre-existing out of facility DNR order (yellow  form or pink MOST form)  -  "MOST" Form in Place?  -     Family Communication: Spoke with daughter yesterday afternoon Disposition Plan: TBD  Time spent: 25 minutes  Alford HighlandWIETING, Jakeria Caissie  [employer]

## 2017-04-21 NOTE — Plan of Care (Signed)
Problem: Pain Managment: Goal: General experience of comfort will improve Outcome: Progressing No complaints of pain this shift. Will continue to monitor.  Problem: Tissue Perfusion: Goal: Risk factors for ineffective tissue perfusion will decrease Outcome: Progressing Coumadin PO.

## 2017-04-21 NOTE — Progress Notes (Signed)
ANTICOAGULATION CONSULT NOTE - follow up  Pharmacy Consult for warfarin Indication: atrial fibrillation  No Known Allergies  Patient Measurements: Height: _0  (170.2 cm) Weight: 161 lb (73 kg) IBW/kg (Calculated) : 61.6 Heparin Dosing Weight:   Vital Signs: Temp: 97.8 F (36.6 C) (07/11 0721) Temp Source: Oral (07/11 0721) BP: 129/91 (07/11 0721) Pulse Rate: 80 (07/11 0721)  Labs:  Recent Labs  04/19/17 1779  04/19/17 0636 04/20/17 0517 04/21/17 0327  HGB  --   < > 9.4* 9.3* 9.9*  HCT  --   --  28.8*  --  29.9*  PLT  --   --  138*  --  152  LABPROT 49.8*  --   --  30.5* 27.4*  INR 5.26*  --   --  2.85 2.49  CREATININE 3.90*  --   --  4.18* 4.51*  < > = values in this interval not displayed.  Estimated Creatinine Clearance: 10.2 mL/min (A) (by C-G formula based on SCr of 4.51 mg/dL (H)).   Medical History: Past Medical History:  Diagnosis Date  . Anxiety   . Arthritis   . Chronic combined systolic and diastolic CHF (congestive heart failure) ([place])    a. 01/2008 Echo ([facility]): EF > 55%, mod-sev LVH w/ grade 1 DD, biatrial enlargement, mild AS, trace MR/TR; b. EF 45-50%, GR2DD, mild to moderate aortic stenosis, mild aortic insufficieny, mild to moderate mitral regurgitation, mild tricuspid regurgitation, mild biatrial enlargement, and a small pericardial effusion  . CKD (chronic kidney disease), stage IV ([place])   . Closed patellar sleeve fracture of right knee    a. 01/2017 - conservatively managed.  Marland Kitchen COPD (chronic obstructive pulmonary disease) ([place])   . Coronary artery disease    a. s/p remote stenting of unknown vessel @ [facility].  . Depression   . Diabetes mellitus with complication ([place])   . DVT (deep venous thrombosis) ([facility])    a. in the setting of right patellar fracture on Coumadin  . Hyperlipidemia   . Hypertension   . Parkinson disease ([place])     Medications:  Prescriptions Prior to Admission  Medication Sig Dispense Refill Last Dose  . acetaminophen  (TYLENOL) 325 MG tablet Take 650 mg by mouth every 6 (six) hours as needed for mild pain.   prn at prn  . atorvastatin (LIPITOR) 20 MG tablet Take 20 mg by mouth daily.   Past Week at Unknown time  . blood glucose meter kit and supplies KIT Dispense based on patient and insurance preference. Use up to four times daily as directed. (FOR ICD-9 250.00, 250.01). 1 each 0 Past Week at Unknown time  . Carbidopa-Levodopa ER (SINEMET CR) 25-100 MG tablet controlled release Take 2 tablets by mouth 4 (four) times daily. 240 tablet 1 Past Week at Unknown time  . Coenzyme Q10 (COQ10) 50 MG CAPS Take 50 mg by mouth daily.   Past Week at Unknown time  . CRANBERRY PO Take 1 tablet by mouth daily.   Past Week at Unknown time  . escitalopram (LEXAPRO) 20 MG tablet Take 20 mg by mouth daily.   Past Week at Unknown time  . furosemide (LASIX) 20 MG tablet Take 1 tablet (20 mg total) by mouth daily. 30 tablet 2 Past Week at Unknown time  . gabapentin (NEURONTIN) 300 MG capsule Take 300 mg by mouth daily.    Past Week at Unknown time  . glucose blood (ACCU-CHEK AVIVA PLUS) test strip Use as instructed 100 each 12 Past  Week at Unknown time  . hydrALAZINE (APRESOLINE) 25 MG tablet Take 1 tablet (25 mg total) by mouth 2 (two) times daily. 180 tablet 3 Past Week at Unknown time  . insulin glargine (LANTUS) 100 UNIT/ML injection Inject 40 Units into the skin at bedtime.    Past Week at Unknown time  . insulin lispro (HUMALOG) 100 UNIT/ML injection Inject 6 Units into the skin 3 (three) times daily with meals.    Past Week at Unknown time  . isosorbide mononitrate (IMDUR) 60 MG 24 hr tablet Take 1 tablet (60 mg total) by mouth daily. 30 tablet 2 Past Week at Unknown time  . loratadine (CLARITIN) 10 MG tablet Take 5 mg by mouth daily as needed for allergies.    prn at prn  . LORazepam (ATIVAN) 1 MG tablet Take 1 tablet (1 mg total) by mouth 2 (two) times daily as needed for anxiety. 5 tablet 0 prn at prn  . metoprolol tartrate  (LOPRESSOR) 50 MG tablet Take 1 tablet (50 mg total) by mouth 2 (two) times daily. 180 tablet 3 Past Week at Unknown time  . mirabegron ER (MYRBETRIQ) 25 MG TB24 tablet Take 25 mg by mouth daily.   Past Week at Unknown time  . nitroGLYCERIN (NITROSTAT) 0.4 MG SL tablet Place 0.4 mg under the tongue every 5 (five) minutes as needed for chest pain.   prn at prn  . Omega-3 Fatty Acids (FISH OIL) 1000 MG CAPS Take 1,000 mg by mouth daily.   Past Week at Unknown time  . polyethylene glycol (MIRALAX / GLYCOLAX) packet Take 17 g by mouth daily as needed for mild constipation.   prn at prn  . promethazine (PHENERGAN) 25 MG tablet Take 25 mg by mouth every 6 (six) hours as needed for nausea or vomiting.   prn at prn  . saccharomyces boulardii (FLORASTOR) 250 MG capsule Take 250 mg by mouth daily.   Past Week at Unknown time  . traMADol (ULTRAM) 50 MG tablet Take 1 tablet (50 mg total) by mouth daily as needed. (Patient taking differently: Take 50 mg by mouth daily as needed for moderate pain. ) 5 tablet 0 prn at prn  . warfarin (COUMADIN) 2 MG tablet Take 1 tablet (2 mg total) by mouth as directed. Every Sunday 30 tablet 0 Past Week at Unknown time  . warfarin (COUMADIN) 3 MG tablet Take 1 tablet (3 mg total) by mouth daily at 6 PM. Mon,tue,wed,thur,friday, sat 30 tablet 1 Past Week at Unknown time   Scheduled:  . amiodarone  200 mg Oral BID  . carbidopa-levodopa  1 tablet Oral QID  . chlorhexidine  15 mL Mouth Rinse BID  . docusate sodium  100 mg Oral BID  . escitalopram  20 mg Oral Daily  . feeding supplement (NEPRO CARB STEADY)  237 mL Oral TID BM  . hydrALAZINE  10 mg Oral BID  . insulin aspart  0-9 Units Subcutaneous TID WC  . insulin glargine  5 Units Subcutaneous QHS  . isosorbide mononitrate  30 mg Oral Daily  . mouth rinse  15 mL Mouth Rinse q12n4p  . metoprolol tartrate  100 mg Oral BID  . multivitamin  15 mL Oral Daily  . saccharomyces boulardii  250 mg Oral Daily  . warfarin  1.5 mg Oral  q1800  . Warfarin - Pharmacist Dosing Inpatient   Does not apply q1800    Assessment: Pharmacy consulted to dose and monitor warfarin therapy in this 76 year old  patient chronically on warfarin for Afib.  Home dose: 3 mg daily except 2 mg on sundays.   7/5 INR 2.59 Warfarin 3 mg 7/6 INR 3.20   Dose Held 7/7 INR 4.29 Dose Held 7/8 INR 4.98 Dose Held 7/9       INR 5.26 Dose Held 7/10     INR 2.85          Warfarin 1.62m  Patient has been initiated on amiodarone 7/5  Goal of Therapy:  INR 2-3 Monitor platelets by anticoagulation protocol: Yes   Plan:  INR is therapeutic at 2.49. Continue warfarin 1.552mdaily. Recheck in [facility].[place], BCPS Clinical Pharmacist  04/21/2017,1:41 PM

## 2017-04-21 NOTE — Progress Notes (Signed)
[facility] [place], [place] 04/21/17  Subjective:   Alert today.  Able to have a good conversation.  Serum creatinine and BUN are worse today Potassium increased to 5.4 Reports loose stools  Objective:  Vital signs in last 24 hours:  Temp:  [96.8 F (36 C)-97.8 F (36.6 C)] 97.8 F (36.6 C) (07/11 0721) Pulse Rate:  [63-80] 80 (07/11 0721) Resp:  [16] 16 (07/11 0721) BP: (102-129)/(75-91) 129/91 (07/11 0721) SpO2:  [95 %-100 %] 96 % (07/11 0721) Weight:  [73 kg (161 lb)] 73 kg (161 lb) (07/11 0500)  Weight change: 3.175 kg (7 lb) Filed Weights   04/19/17 0500 04/20/17 0500 04/21/17 0500  Weight: 75.3 kg (166 lb) 69.9 kg (154 lb) 73 kg (161 lb)    Intake/Output:    Intake/Output Summary (Last 24 hours) at 04/21/17 1700 Last data filed at 04/20/17 2250  Gross per 24 hour  Intake                0 ml  Output              510 ml  Net             -510 ml     Physical Exam: General: Laying in the bed, no acute distress  HEENT anicteric  Neck supple  Pulm/lungs Normal breathing, mild rhonchi at bases  CVS/Heart irregular  Abdomen:  Soft, mild diffuse tenderness  Extremities: Trace edema  Neurologic: Alert and oreinted  Skin: Scattered ecchymosis          Basic Metabolic Panel:   Recent Labs Lab 04/18/17 0318 04/18/17 0919 04/19/17 0632 04/20/17 0517 04/21/17 0327  NA 140 140 139 138 141  K 5.7* 5.3* 5.2* 5.5* 5.4*  CL 104 104 103 101 103  CO2 28 27 28 28 29   GLUCOSE 174* 171* 200* 244* 134*  BUN 72* 74* 77* 84* 90*  CREATININE 3.84* 4.11* 3.90* 4.18* 4.51*  CALCIUM 8.6* 8.9 8.4* 8.4* 8.6*  PHOS  --   --  4.0  --   --      CBC:  Recent Labs Lab 04/16/17 0443 04/18/17 0934 04/19/17 0636 04/20/17 0517 04/21/17 0327  WBC 4.4 6.1 4.9  --  5.8  HGB 9.2* 9.9* 9.4* 9.3* 9.9*  HCT 28.1* 30.3* 28.8*  --  29.9*  MCV 86.3 86.2 86.9  --  87.8  PLT 138* 150 138*  --  152     No results found for: HEPBSAG, HEPBSAB,  HEPBIGM    Microbiology:  Recent Results (from the past 240 hour(s))  C difficile quick scan w PCR reflex     Status: None   Collection Time: 04/17/17  2:50 PM  Result Value Ref Range Status   C Diff antigen NEGATIVE NEGATIVE Final   C Diff toxin NEGATIVE NEGATIVE Final   C Diff interpretation No C. difficile detected.  Final  Gastrointestinal Panel by PCR , Stool     Status: None   Collection Time: 04/17/17  2:50 PM  Result Value Ref Range Status   Campylobacter species NOT DETECTED NOT DETECTED Final   Plesimonas shigelloides NOT DETECTED NOT DETECTED Final   Salmonella species NOT DETECTED NOT DETECTED Final   Yersinia enterocolitica NOT DETECTED NOT DETECTED Final   Vibrio species NOT DETECTED NOT DETECTED Final   Vibrio cholerae NOT DETECTED NOT DETECTED Final   Enteroaggregative E coli (EAEC) NOT DETECTED NOT DETECTED Final   Enteropathogenic E coli (EPEC) NOT DETECTED NOT DETECTED  Final   Enterotoxigenic E coli (ETEC) NOT DETECTED NOT DETECTED Final   Shiga like toxin producing E coli (STEC) NOT DETECTED NOT DETECTED Final   Shigella/Enteroinvasive E coli (EIEC) NOT DETECTED NOT DETECTED Final   Cryptosporidium NOT DETECTED NOT DETECTED Final   Cyclospora cayetanensis NOT DETECTED NOT DETECTED Final   Entamoeba histolytica NOT DETECTED NOT DETECTED Final   Giardia lamblia NOT DETECTED NOT DETECTED Final   Adenovirus F40/41 NOT DETECTED NOT DETECTED Final   Astrovirus NOT DETECTED NOT DETECTED Final   Norovirus GI/GII NOT DETECTED NOT DETECTED Final   Rotavirus A NOT DETECTED NOT DETECTED Final   Sapovirus (I, II, IV, and V) NOT DETECTED NOT DETECTED Final    Coagulation Studies:  Recent Labs  04/19/17 0632 04/20/17 0517 04/21/17 0327  LABPROT 49.8* 30.5* 27.4*  INR 5.26* 2.85 2.49    Urinalysis: No results for input(s): COLORURINE, LABSPEC, PHURINE, GLUCOSEU, HGBUR, BILIRUBINUR, KETONESUR, PROTEINUR, UROBILINOGEN, NITRITE, LEUKOCYTESUR in the last 72  hours.  Invalid input(s): APPERANCEUR    Imaging: No results found.   Medications:   . sodium chloride 40 mL/hr at 04/21/17 0813  . sodium chloride     . amiodarone  200 mg Oral BID  . carbidopa-levodopa  1 tablet Oral QID  . chlorhexidine  15 mL Mouth Rinse BID  . docusate sodium  100 mg Oral BID  . escitalopram  20 mg Oral Daily  . feeding supplement (NEPRO CARB STEADY)  237 mL Oral TID BM  . hydrALAZINE  10 mg Oral BID  . insulin aspart  0-9 Units Subcutaneous TID WC  . insulin glargine  5 Units Subcutaneous QHS  . isosorbide mononitrate  30 mg Oral Daily  . mouth rinse  15 mL Mouth Rinse q12n4p  . metoprolol tartrate  100 mg Oral BID  . multivitamin  15 mL Oral Daily  . saccharomyces boulardii  250 mg Oral Daily  . warfarin  1.5 mg Oral q1800  . Warfarin - Pharmacist Dosing Inpatient   Does not apply q1800   acetaminophen **OR** acetaminophen, bisacodyl, loratadine, LORazepam, nitroGLYCERIN, ondansetron **OR** ondansetron (ZOFRAN) IV, polyethylene glycol  Assessment/ Plan:  76 y.o. caucsian  female with coronary artery disease, congestive heart failure, pulmonary hypertension, parkinson's, diabetes mellitus type II, COPD, DVT on warfarin, atrial fibrillation, hyperlipidemia, anemia, anxiety, patellar fracture,  1. Acute renal failure with hyperkalemia on chronic kidney disease stage IV with proteinuria.  Baseline creatinine of 2.5, GFR of 17.  Chronic kidney disease secondary to hypertension and diabetes Acute renal failure secondary to overdiuresis and hypotension leading to ATN.  No IV contrast exposure.  - agree with holding diuretics - serum creatinine and BUN continued to worsen  - discussed with patient regarding worsening renal failure. She agrees to take dialysis. Probably ESRD - Plan for permcath Thursday - Dialysis after permcath placement - start outpatient d/c planning  2. Diabetes mellitus type II with chronic kidney disease: insulin dependent.  Hemoglobin A1c 7.5% on 6/24  3. Hyperkalemia - agree with SPS- likely causing diarrhea   LOS: 11 Molly Wolf 7/11/20185:00 PM  [facility] [address] [place], [place] [phone]

## 2017-04-21 NOTE — Progress Notes (Signed)
Nutrition Follow-up  DOCUMENTATION CODES:   Not applicable  INTERVENTION:   -D/c Ensure Enlive po TID, each supplement provides 350 kcal and 20 grams of protein -D/c Magic Cup TID -Nepro Shake po TID, each supplement provides 425 kcal and 19 grams protein  NUTRITION DIAGNOSIS:   Malnutrition related to acute illness as evidenced by energy intake < or equal to 50% for > or equal to 5 days, severe depletion of body fat, severe depletion of muscle mass, percent weight loss, moderate depletion of body fat.  Ongoing  GOAL:   Patient will meet greater than or equal to 90% of their needs  Progressing  MONITOR:   PO intake, Supplement acceptance, Labs, Weight trends, I & O's  REASON FOR ASSESSMENT:   Malnutrition Screening Tool    ASSESSMENT:   76 year old female with PMHx of HTN, CAD, Parkinson's disease, HLD, anxiety, depression, CKD stage IV, COPD, chronic combined systolic and diastolic CHF, DM type 2, hx of DVT who presented after an episode of unresponsiveness. Patient with recent admission 6/23-6/29 for acute on chronic CHF, acute on chronic kidney disease.  Spoke with nurse tech prior to visit, who reports pt remains very confused. Pt acknowledged this RD, however, unable to provide any meaningful history.   Per chart review, RN reports pt choking on meds on 04/16/17. Family refused SLP evaluation. Palliative care following; family desires pt to remain full code and are considering initiation of HD.   Pt has been ordered a renal diet since 04/18/17. Intake remains poor; meal completion variable (PO: 0-60%). Observed breakfast tray- pt consumed 2/3 of oatmeal and 100% of fruit juice. Pt is also taking Ensure Enlive supplements. She did not consume [employer] Cup.   Plan to d/c to SNF once medically stable.   Labs reviewed: K: 5.4, CBGS: 60-124, BUN/Creat: 90/4.51.  Diet Order:  Diet renal with fluid restriction Fluid restriction: 1200 mL Fluid; Room service appropriate? Yes;  Fluid consistency: Thin  Skin:   (MASD bilateral buttocs)  Last BM:  04/21/17  Height:   Ht Readings from Last 1 Encounters:  04/09/17 5\' 7"  (1.702 m)    Weight:   Wt Readings from Last 1 Encounters:  04/21/17 161 lb (73 kg)    Ideal Body Weight:  61.4 kg  BMI:  Body mass index is 25.22 kg/m.  Estimated Nutritional Needs:   Kcal:  1470-1715 (MSJ x 1.2-1.4)  Protein:  70-85 grams (1-1.2 grams/kg)  Fluid:  1.7 L/day (25 ml/kg)  EDUCATION NEEDS:   No education needs identified at this time  Clint Strupp A. Mayford KnifeWilliams, RD, LDN, CDE Pager: [phone] After hours Pager: [phone]

## 2017-04-22 ENCOUNTER — Inpatient Hospital Stay
Admission: EM | Disposition: A | Discharge status: Skilled Nursing Facility | Payer: Self-pay | Source: Home / Self Care | Attending: Internal Medicine

## 2017-04-22 DIAGNOSIS — N186 End stage renal disease: Secondary | ICD-10-CM

## 2017-04-22 HISTORY — PX: DIALYSIS/PERMA CATHETER INSERTION: CATH118288

## 2017-04-22 LAB — RENAL FUNCTION PANEL
Albumin: 3 g/dL — ABNORMAL LOW (ref 3.5–5.0)
Anion gap: 11 (ref 5–15)
BUN: 75 mg/dL — AB (ref 6–20)
CHLORIDE: 101 mmol/L (ref 101–111)
CO2: 29 mmol/L (ref 22–32)
Calcium: 8.2 mg/dL — ABNORMAL LOW (ref 8.9–10.3)
Creatinine, Ser: 3.55 mg/dL — ABNORMAL HIGH (ref 0.44–1.00)
GFR calc Af Amer: 13 mL/min — ABNORMAL LOW (ref >60)
GFR calc non Af Amer: 11 mL/min — ABNORMAL LOW (ref >60)
GLUCOSE: 173 mg/dL — AB (ref 65–99)
POTASSIUM: 4.4 mmol/L (ref 3.5–5.1)
Phosphorus: 3.7 mg/dL (ref 2.5–4.6)
Sodium: 141 mmol/L (ref 135–145)

## 2017-04-22 LAB — GLUCOSE, CAPILLARY
Glucose-Capillary: 84 mg/dL (ref 65–99)
Glucose-Capillary: 88 mg/dL (ref 65–99)
Glucose-Capillary: 96 mg/dL (ref 65–99)
Glucose-Capillary: 151 mg/dL — ABNORMAL HIGH (ref 65–99)
Glucose-Capillary: 237 mg/dL — ABNORMAL HIGH (ref 65–99)

## 2017-04-22 LAB — CBC
HEMATOCRIT: 27 % — AB (ref 35.0–47.0)
Hemoglobin: 8.7 g/dL — ABNORMAL LOW (ref 12.0–16.0)
MCH: 28.4 pg (ref 26.0–34.0)
MCHC: 32.3 g/dL (ref 32.0–36.0)
MCV: 88 fL (ref 80.0–100.0)
Platelets: 162 10*3/uL (ref 150–440)
RBC: 3.07 MIL/uL — ABNORMAL LOW (ref 3.80–5.20)
RDW: 17.2 % — AB (ref 11.5–14.5)
WBC: 4.6 10*3/uL (ref 3.6–11.0)

## 2017-04-22 LAB — PROTIME-INR
INR: 2.67
Prothrombin Time: 29 seconds — ABNORMAL HIGH (ref 11.4–15.2)

## 2017-04-22 SURGERY — DIALYSIS/PERMA CATHETER INSERTION
Anesthesia: Moderate Sedation

## 2017-04-22 MED ORDER — MIDAZOLAM HCL 2 MG/2ML IJ SOLN
INTRAMUSCULAR | Status: DC | PRN
  Administered 2017-04-22 (×2): 1 mg via INTRAVENOUS

## 2017-04-22 MED ORDER — MIDAZOLAM HCL 5 MG/5ML IJ SOLN
INTRAMUSCULAR | Status: AC
  Filled 2017-04-22: qty 5

## 2017-04-22 MED ORDER — FENTANYL CITRATE (PF) 100 MCG/2ML IJ SOLN
INTRAMUSCULAR | Status: DC | PRN
  Administered 2017-04-22: 50 ug via INTRAVENOUS

## 2017-04-22 MED ORDER — LIDOCAINE-EPINEPHRINE (PF) 2 %-1:200000 IJ SOLN
INTRAMUSCULAR | Status: AC
  Filled 2017-04-22: qty 20

## 2017-04-22 MED ORDER — HEPARIN (PORCINE) IN NACL 2-0.9 UNIT/ML-% IJ SOLN
INTRAMUSCULAR | Status: AC
  Filled 2017-04-22: qty 500

## 2017-04-22 MED ORDER — TUBERCULIN PPD 5 UNIT/0.1ML ID SOLN
5.0000 [IU] | Freq: Once | INTRADERMAL | Status: AC
  Administered 2017-04-22: 5 [IU] via INTRADERMAL
  Filled 2017-04-22: qty 0.1

## 2017-04-22 MED ORDER — FENTANYL CITRATE (PF) 100 MCG/2ML IJ SOLN
INTRAMUSCULAR | Status: AC
  Filled 2017-04-22: qty 2

## 2017-04-22 MED ORDER — HEPARIN SODIUM (PORCINE) 10000 UNIT/ML IJ SOLN
INTRAMUSCULAR | Status: AC
  Filled 2017-04-22: qty 1

## 2017-04-22 SURGICAL SUPPLY — 6 items
CATH PALINDROME RT-P 15FX19CM (CATHETERS) ×3 IMPLANT
DERMABOND ADVANCED (GAUZE/BANDAGES/DRESSINGS) ×2
DERMABOND ADVANCED .7 DNX12 (GAUZE/BANDAGES/DRESSINGS) ×1 IMPLANT
PACK ANGIOGRAPHY (CUSTOM PROCEDURE TRAY) ×3 IMPLANT
SUT MNCRL AB 4-0 PS2 18 (SUTURE) ×3 IMPLANT
SUT PROLENE 0 CT 1 30 (SUTURE) ×3 IMPLANT

## 2017-04-22 NOTE — Progress Notes (Signed)
PT Cancellation Note  Patient Details Name: Molly CunasJudy Saner MRN: [id number] DOB: June 06, 1941   Cancelled Treatment:    Reason Eval/Treat Not Completed: Patient at procedure or test/unavailable. Pt unavailable this morning due to vascular surgery for dialysis catheter placement and this afternoon due to hemodialysis. No new lab values reported for potassium to assess appropriateness either. Continue as the schedule and pt status allows.    Scot DockHeidi E Aiesha Leland, PTA 04/22/2017, 2:45 PM

## 2017-04-22 NOTE — Clinical Social Work Note (Signed)
CSW updated patient's daughter Freida BusmanDonna Lucas [phone] that per [facility] patient still has a bed available.  CSW continuing to follow patient's progress throughout discharge planning.  Ervin KnackEric R. Sayvion Vigen, MSW, Theresia MajorsLCSWA [phone]  04/22/2017 5:17 PM

## 2017-04-22 NOTE — Progress Notes (Signed)
Patient in recovery from dialysis access placement. Spoke to Dialysis nurse, stated that she was not ready to dialyze patient yet. Informed nurse that patient would return to room once recovery was complete.

## 2017-04-22 NOTE — Progress Notes (Signed)
HD TX started  

## 2017-04-22 NOTE — Progress Notes (Signed)
HD TX ended  

## 2017-04-22 NOTE — Progress Notes (Signed)
PRE HD   

## 2017-04-22 NOTE — Plan of Care (Signed)
Problem: Pain Managment: Goal: General experience of comfort will improve Outcome: Progressing Pt complained of a headache once, tylenol given. With relief. Will continue to monitor.  Problem: Activity: Goal: Risk for activity intolerance will decrease Outcome: Progressing Up to [facility] with 1 assist, tolerated well.   Problem: Bowel/Gastric: Goal: Will not experience complications related to bowel motility Outcome: Completed/Met Date Met: 04/22/17 Pt had BM this shift.

## 2017-04-22 NOTE — Care Management (Addendum)
Dialysis access today and initiate hemodialysis.  Obtained order for PPD and hepatitis screen is pending.  Notified Dimas ChyleAmanda Morris with [employer]

## 2017-04-22 NOTE — Progress Notes (Signed)
[facility], [place] 04/22/17  Subjective:   Patient just returned from catheter placement.  Quite sleepy this morning No acute events reported by nursing staff Blood pressure 150/100  Objective:  Vital signs in last 24 hours:  Temp:  [97.5 F (36.4 C)-97.7 F (36.5 C)] 97.7 F (36.5 C) (07/12 1145) Pulse Rate:  [68-81] 76 (07/12 1145) Resp:  [11-19] 18 (07/12 1145) BP: (110-150)/(77-100) 150/100 (07/12 1145) SpO2:  [93 %-100 %] 98 % (07/12 1145) Weight:  [77.7 kg (171 lb 6.4 oz)] 77.7 kg (171 lb 6.4 oz) (07/12 0441)  Weight change: 4.717 kg (10 lb 6.4 oz) Filed Weights   04/20/17 0500 04/21/17 0500 04/22/17 0441  Weight: 69.9 kg (154 lb) 73 kg (161 lb) 77.7 kg (171 lb 6.4 oz)    Intake/Output:    Intake/Output Summary (Last 24 hours) at 04/22/17 1301 Last data filed at 04/22/17 0641  Gross per 24 hour  Intake             1190 ml  Output              300 ml  Net              890 ml     Physical Exam: General: Laying in the bed, no acute distress  HEENT anicteric  Neck supple  Pulm/lungs Normal breathing, mild rhonchi at bases  CVS/Heart irregular  Abdomen:  Soft, mild diffuse tenderness  Extremities: Trace edema  Neurologic: Sleepy after the procedure  Skin: Scattered ecchymosis          Basic Metabolic Panel:   Recent Labs Lab 04/18/17 0318 04/18/17 0919 04/19/17 0632 04/20/17 0517 04/21/17 0327  NA 140 140 139 138 141  K 5.7* 5.3* 5.2* 5.5* 5.4*  CL 104 104 103 101 103  CO2 28 27 28 28 29   GLUCOSE 174* 171* 200* 244* 134*  BUN 72* 74* 77* 84* 90*  CREATININE 3.84* 4.11* 3.90* 4.18* 4.51*  CALCIUM 8.6* 8.9 8.4* 8.4* 8.6*  PHOS  --   --  4.0  --   --      CBC:  Recent Labs Lab 04/16/17 0443 04/18/17 0934 04/19/17 0636 04/20/17 0517 04/21/17 0327  WBC 4.4 6.1 4.9  --  5.8  HGB 9.2* 9.9* 9.4* 9.3* 9.9*  HCT 28.1* 30.3* 28.8*  --  29.9*  MCV 86.3 86.2 86.9  --  87.8  PLT 138* 150 138*  --  152     No  results found for: HEPBSAG, HEPBSAB, HEPBIGM    Microbiology:  Recent Results (from the past 240 hour(s))  C difficile quick scan w PCR reflex     Status: None   Collection Time: 04/17/17  2:50 PM  Result Value Ref Range Status   C Diff antigen NEGATIVE NEGATIVE Final   C Diff toxin NEGATIVE NEGATIVE Final   C Diff interpretation No C. difficile detected.  Final  Gastrointestinal Panel by PCR , Stool     Status: None   Collection Time: 04/17/17  2:50 PM  Result Value Ref Range Status   Campylobacter species NOT DETECTED NOT DETECTED Final   Plesimonas shigelloides NOT DETECTED NOT DETECTED Final   Salmonella species NOT DETECTED NOT DETECTED Final   Yersinia enterocolitica NOT DETECTED NOT DETECTED Final   Vibrio species NOT DETECTED NOT DETECTED Final   Vibrio cholerae NOT DETECTED NOT DETECTED Final   Enteroaggregative E coli (EAEC) NOT DETECTED NOT DETECTED Final   Enteropathogenic E coli (EPEC)  NOT DETECTED NOT DETECTED Final   Enterotoxigenic E coli (ETEC) NOT DETECTED NOT DETECTED Final   Shiga like toxin producing E coli (STEC) NOT DETECTED NOT DETECTED Final   Shigella/Enteroinvasive E coli (EIEC) NOT DETECTED NOT DETECTED Final   Cryptosporidium NOT DETECTED NOT DETECTED Final   Cyclospora cayetanensis NOT DETECTED NOT DETECTED Final   Entamoeba histolytica NOT DETECTED NOT DETECTED Final   Giardia lamblia NOT DETECTED NOT DETECTED Final   Adenovirus F40/41 NOT DETECTED NOT DETECTED Final   Astrovirus NOT DETECTED NOT DETECTED Final   Norovirus GI/GII NOT DETECTED NOT DETECTED Final   Rotavirus A NOT DETECTED NOT DETECTED Final   Sapovirus (I, II, IV, and V) NOT DETECTED NOT DETECTED Final    Coagulation Studies:  Recent Labs  04/20/17 0517 04/21/17 0327  LABPROT 30.5* 27.4*  INR 2.85 2.49    Urinalysis: No results for input(s): COLORURINE, LABSPEC, PHURINE, GLUCOSEU, HGBUR, BILIRUBINUR, KETONESUR, PROTEINUR, UROBILINOGEN, NITRITE, LEUKOCYTESUR in the last 72  hours.  Invalid input(s): APPERANCEUR    Imaging: No results found.   Medications:   . sodium chloride 40 mL/hr at 04/21/17 1838  . sodium chloride     . amiodarone  200 mg Oral BID  . carbidopa-levodopa  1 tablet Oral QID  . chlorhexidine  15 mL Mouth Rinse BID  . docusate sodium  100 mg Oral BID  . escitalopram  20 mg Oral Daily  . feeding supplement (NEPRO CARB STEADY)  237 mL Oral TID BM  . hydrALAZINE  10 mg Oral BID  . insulin aspart  0-9 Units Subcutaneous TID WC  . insulin glargine  5 Units Subcutaneous QHS  . isosorbide mononitrate  30 mg Oral Daily  . mouth rinse  15 mL Mouth Rinse q12n4p  . metoprolol tartrate  100 mg Oral BID  . multivitamin  15 mL Oral Daily  . saccharomyces boulardii  250 mg Oral Daily  . tuberculin  5 Units Intradermal Once  . warfarin  1.5 mg Oral q1800  . Warfarin - Pharmacist Dosing Inpatient   Does not apply q1800   acetaminophen **OR** acetaminophen, bisacodyl, loratadine, LORazepam, nitroGLYCERIN, ondansetron **OR** ondansetron (ZOFRAN) IV, polyethylene glycol  Assessment/ Plan:  76 y.o. caucsian  female with coronary artery disease, congestive heart failure, pulmonary hypertension, parkinson's, diabetes mellitus type II, COPD, DVT on warfarin, atrial fibrillation, hyperlipidemia, anemia, anxiety, patellar fracture,  1. Acute renal failure with hyperkalemia on chronic kidney disease stage IV with proteinuria.  Baseline creatinine of 2.5, GFR of 17.  Chronic kidney disease secondary to hypertension and diabetes Acute renal failure secondary to overdiuresis and hypotension leading to ATN.  No IV contrast exposure.  - agree with holding diuretics - serum creatinine and BUN continued to worsen  - PermCath placed this morning.  Start hemodialysis today. -  Low chance of Renal recovery but monitor UOP PPD placed start outpatient d/c planning  2. Diabetes mellitus type II with chronic kidney disease: insulin dependent. Hemoglobin A1c  7.5% on 6/24  3. Hyperkalemia - Expected to correct with hemodialysis   LOS: 12 [facility] HospitalINGH,Raekwan Spelman 7/12/20181:01 PM  [facility] [address] [place], [place] [phone]

## 2017-04-22 NOTE — Progress Notes (Signed)
Hemodialysis started today and well tolerated

## 2017-04-22 NOTE — Progress Notes (Signed)
Patient ID: Molly Wolf, female   DOB: [birth year], 76 y.o.   MRN: [id number]   [employer] PROGRESS NOTE  Molly Wolf WUJ:[id number] DOB: 07-Aug-1941 DOA: 04/09/2017 PCP: Darliss Ridgel, MD  HPI/Subjective: Patient complains of some lower abdominal pain. No nausea or vomiting. Not feeling as well today.  Objective: Vitals:   04/22/17 1500 04/22/17 1530  BP: (!) 141/96 125/76  Pulse: 70 68  Resp: 15 15  Temp:      Filed Weights   04/21/17 0500 04/22/17 0441 04/22/17 1430  Weight: 73 kg (161 lb) 77.7 kg (171 lb 6.4 oz) 77.8 kg (171 lb 8.3 oz)    ROS: Review of Systems  Constitutional: Negative for chills and fever.  Eyes: Negative for blurred vision.  Respiratory: Negative for cough and shortness of breath.   Cardiovascular: Negative for chest pain.  Gastrointestinal: Positive for abdominal pain. Negative for constipation, diarrhea, nausea and vomiting.  Genitourinary: Negative for dysuria.  Musculoskeletal: Negative for joint pain.  Neurological: Negative for headaches.  Limited with review of systems Exam: Physical Exam  HENT:  Nose: No mucosal edema.  Mouth/Throat: No oropharyngeal exudate or posterior oropharyngeal edema.  Eyes: Pupils are equal, round, and reactive to light. Conjunctivae, EOM and lids are normal.  Neck: No JVD present. Carotid bruit is not present. No edema present. No thyroid mass and no thyromegaly present.  Cardiovascular: S1 normal and S2 normal.  Exam reveals no gallop.   No murmur heard. Pulses:      Dorsalis pedis pulses are 2+ on the right side, and 2+ on the left side.  Respiratory: No respiratory distress. She has decreased breath sounds in the right lower field and the left lower field. She has no wheezes. She has no rhonchi. She has no rales.  GI: Soft. Bowel sounds are normal. There is no tenderness.  Musculoskeletal:       Right ankle: She exhibits no swelling.       Left ankle: She exhibits no swelling.  Lymphadenopathy:   She has no cervical adenopathy.  Neurological: She is alert. No cranial nerve deficit.  Skin: Skin is warm. No rash noted. Nails show no clubbing.  Psychiatric: She has a normal mood and affect.      Data Reviewed: Basic Metabolic Panel:  Recent Labs Lab 04/18/17 0919 04/19/17 0632 04/20/17 0517 04/21/17 0327 04/22/17 1449  NA 140 139 138 141 141  K 5.3* 5.2* 5.5* 5.4* 4.4  CL 104 103 101 103 101  CO2 27 28 28 29 29   GLUCOSE 171* 200* 244* 134* 173*  BUN 74* 77* 84* 90* 75*  CREATININE 4.11* 3.90* 4.18* 4.51* 3.55*  CALCIUM 8.9 8.4* 8.4* 8.6* 8.2*  PHOS  --  4.0  --   --  3.7   CBC:  Recent Labs Lab 04/16/17 0443 04/18/17 0934 04/19/17 0636 04/20/17 0517 04/21/17 0327 04/22/17 1449  WBC 4.4 6.1 4.9  --  5.8 4.6  HGB 9.2* 9.9* 9.4* 9.3* 9.9* 8.7*  HCT 28.1* 30.3* 28.8*  --  29.9* 27.0*  MCV 86.3 86.2 86.9  --  87.8 88.0  PLT 138* 150 138*  --  152 162   BNP (last 3 results)  Recent Labs  01/26/17 1112 04/03/17 1918 04/05/17 0453  BNP 2,049.0* 1,139.0* 980.0*     CBG:  Recent Labs Lab 04/21/17 1644 04/21/17 2130 04/22/17 0748 04/22/17 0938 04/22/17 1145  GLUCAP 206* 117* 84 88 96    Recent Results (from the past 240 hour(s))  C  difficile quick scan w PCR reflex     Status: None   Collection Time: 04/17/17  2:50 PM  Result Value Ref Range Status   C Diff antigen NEGATIVE NEGATIVE Final   C Diff toxin NEGATIVE NEGATIVE Final   C Diff interpretation No C. difficile detected.  Final  Gastrointestinal Panel by PCR , Stool     Status: None   Collection Time: 04/17/17  2:50 PM  Result Value Ref Range Status   Campylobacter species NOT DETECTED NOT DETECTED Final   Plesimonas shigelloides NOT DETECTED NOT DETECTED Final   Salmonella species NOT DETECTED NOT DETECTED Final   Yersinia enterocolitica NOT DETECTED NOT DETECTED Final   Vibrio species NOT DETECTED NOT DETECTED Final   Vibrio cholerae NOT DETECTED NOT DETECTED Final    Enteroaggregative E coli (EAEC) NOT DETECTED NOT DETECTED Final   Enteropathogenic E coli (EPEC) NOT DETECTED NOT DETECTED Final   Enterotoxigenic E coli (ETEC) NOT DETECTED NOT DETECTED Final   Shiga like toxin producing E coli (STEC) NOT DETECTED NOT DETECTED Final   Shigella/Enteroinvasive E coli (EIEC) NOT DETECTED NOT DETECTED Final   Cryptosporidium NOT DETECTED NOT DETECTED Final   Cyclospora cayetanensis NOT DETECTED NOT DETECTED Final   Entamoeba histolytica NOT DETECTED NOT DETECTED Final   Giardia lamblia NOT DETECTED NOT DETECTED Final   Adenovirus F40/41 NOT DETECTED NOT DETECTED Final   Astrovirus NOT DETECTED NOT DETECTED Final   Norovirus GI/GII NOT DETECTED NOT DETECTED Final   Rotavirus A NOT DETECTED NOT DETECTED Final   Sapovirus (I, II, IV, and V) NOT DETECTED NOT DETECTED Final     Scheduled Meds: . amiodarone  200 mg Oral BID  . carbidopa-levodopa  1 tablet Oral QID  . chlorhexidine  15 mL Mouth Rinse BID  . docusate sodium  100 mg Oral BID  . escitalopram  20 mg Oral Daily  . feeding supplement (NEPRO CARB STEADY)  237 mL Oral TID BM  . hydrALAZINE  10 mg Oral BID  . insulin aspart  0-9 Units Subcutaneous TID WC  . insulin glargine  5 Units Subcutaneous QHS  . isosorbide mononitrate  30 mg Oral Daily  . mouth rinse  15 mL Mouth Rinse q12n4p  . metoprolol tartrate  100 mg Oral BID  . multivitamin  15 mL Oral Daily  . saccharomyces boulardii  250 mg Oral Daily  . tuberculin  5 Units Intradermal Once  . warfarin  1.5 mg Oral q1800  . Warfarin - Pharmacist Dosing Inpatient   Does not apply q1800    Assessment/Plan:  1. Acute kidney injury on chronic kidney disease stage IV.  Dialysis started today. Uremia could be contributing to the patient's altered mental status 2. Hyperkalemia secondary to acute kidney injury.  Treated yesterday with Kayexalate 3. Acute respiratory failure. Tapered off oxygen. 4. Acute encephalopathy. Continue to be careful with any  medications that can cause altered mental status. Uremia could be contributing. 5. Pneumonia. Received 5 days of antibiotic and then discontinued since pro calcitonin was negative. 6. History of Parkinson's on Sinemet 7. Type 2 diabetes mellitus. Continue Lantus 5 units subcutaneous injection daily and sliding scale. Watch closely with worsening kidney function. 8. Acute on chronic systolic congestive heart failure. Holding Lasix with overdiuresis. Continue metoprolol. No ACE inhibitor or arm secondary to acute kidney injury. 9. Atrial fibrillation with rapid ventricular rate on metoprolol and amiodarone at this point with better control of heart rate. INR still elevated 2.76. Pharmacy dosing Coumadin 10.  Chronic anemia. Recent GI bleed. 11. Hyperlipidemia unspecified on atorvastatin 12. History of DVT on Coumadin. Pharmacy dosing Coumadin 13. Palliative care consultation appreciated   Code Status:     Code Status Orders        Start     Ordered   04/10/17 0328  Full code  Continuous     04/10/17 0327    Code Status History    Date Active Date Inactive Code Status Order ID Comments User Context   04/03/2017 11:30 PM 04/09/2017 10:09 PM Full Code [id number]  Hugelmeyer, Jon GillsAlexis, DO ED   02/15/2017  6:16 PM 02/16/2017  6:18 PM Full Code [id number]  Altamese DillingVachhani, Vaibhavkumar, MD Inpatient   01/26/2017  2:44 PM 01/29/2017  9:47 PM Full Code [id number]  Alford HighlandWieting, Destani Wamser, MD ED   12/28/2016  7:44 AM 12/29/2016  4:34 PM DNR [id number]  Delfino LovettShah, Vipul, MD Inpatient   12/27/2016 11:06 PM 12/28/2016  7:44 AM Full Code [id number]  Hugelmeyer, Jon GillsAlexis, DO Inpatient    Advance Directive Documentation     Most Recent Value  Type of Advance Directive  Healthcare Power of Attorney  Pre-existing out of facility DNR order (yellow form or pink MOST form)  -  "MOST" Form in Place?  -     Family Communication: Left message for her daughter Disposition Plan: TBD  Time spent: 24 minutes  Alford HighlandWIETING, Marlayna Bannister  [employer]

## 2017-04-22 NOTE — Progress Notes (Signed)
ANTICOAGULATION CONSULT NOTE - follow up  Pharmacy Consult for warfarin Indication: atrial fibrillation  No Known Allergies  Patient Measurements: Height: _0  (170.2 cm) Weight: 171 lb 8.3 oz (77.8 kg) IBW/kg (Calculated) : 61.6 Heparin Dosing Weight:   Vital Signs: Temp: 98 F (36.7 C) (07/12 1430) Temp Source: Oral (07/12 1430) BP: 125/76 (07/12 1530) Pulse Rate: 68 (07/12 1530)  Labs:  Recent Labs  04/20/17 0517 04/21/17 0327 04/22/17 1449  HGB 9.3* 9.9* 8.7*  HCT  --  29.9* 27.0*  PLT  --  152 162  LABPROT 30.5* 27.4* 29.0*  INR 2.85 2.49 2.67  CREATININE 4.18* 4.51* 3.55*    Estimated Creatinine Clearance: 14.3 mL/min (A) (by C-G formula based on SCr of 3.55 mg/dL (H)).   Medical History: Past Medical History:  Diagnosis Date  . Anxiety   . Arthritis   . Chronic combined systolic and diastolic CHF (congestive heart failure) ([place])    a. 01/2008 Echo ([facility]): EF > 55%, mod-sev LVH w/ grade 1 DD, biatrial enlargement, mild AS, trace MR/TR; b. EF 45-50%, GR2DD, mild to moderate aortic stenosis, mild aortic insufficieny, mild to moderate mitral regurgitation, mild tricuspid regurgitation, mild biatrial enlargement, and a small pericardial effusion  . CKD (chronic kidney disease), stage IV ([place])   . Closed patellar sleeve fracture of right knee    a. 01/2017 - conservatively managed.  Marland Kitchen COPD (chronic obstructive pulmonary disease) ([place])   . Coronary artery disease    a. s/p remote stenting of unknown vessel @ [facility].  . Depression   . Diabetes mellitus with complication ([place])   . DVT (deep venous thrombosis) ([facility])    a. in the setting of right patellar fracture on Coumadin  . Hyperlipidemia   . Hypertension   . Parkinson disease ([place])     Medications:  Prescriptions Prior to Admission  Medication Sig Dispense Refill Last Dose  . acetaminophen (TYLENOL) 325 MG tablet Take 650 mg by mouth every 6 (six) hours as needed for mild pain.   prn at prn  .  atorvastatin (LIPITOR) 20 MG tablet Take 20 mg by mouth daily.   Past Week at Unknown time  . blood glucose meter kit and supplies KIT Dispense based on patient and insurance preference. Use up to four times daily as directed. (FOR ICD-9 250.00, 250.01). 1 each 0 Past Week at Unknown time  . Carbidopa-Levodopa ER (SINEMET CR) 25-100 MG tablet controlled release Take 2 tablets by mouth 4 (four) times daily. 240 tablet 1 Past Week at Unknown time  . Coenzyme Q10 (COQ10) 50 MG CAPS Take 50 mg by mouth daily.   Past Week at Unknown time  . CRANBERRY PO Take 1 tablet by mouth daily.   Past Week at Unknown time  . escitalopram (LEXAPRO) 20 MG tablet Take 20 mg by mouth daily.   Past Week at Unknown time  . furosemide (LASIX) 20 MG tablet Take 1 tablet (20 mg total) by mouth daily. 30 tablet 2 Past Week at Unknown time  . gabapentin (NEURONTIN) 300 MG capsule Take 300 mg by mouth daily.    Past Week at Unknown time  . glucose blood (ACCU-CHEK AVIVA PLUS) test strip Use as instructed 100 each 12 Past Week at Unknown time  . hydrALAZINE (APRESOLINE) 25 MG tablet Take 1 tablet (25 mg total) by mouth 2 (two) times daily. 180 tablet 3 Past Week at Unknown time  . insulin glargine (LANTUS) 100 UNIT/ML injection Inject 40 Units into the skin  at bedtime.    Past Week at Unknown time  . insulin lispro (HUMALOG) 100 UNIT/ML injection Inject 6 Units into the skin 3 (three) times daily with meals.    Past Week at Unknown time  . isosorbide mononitrate (IMDUR) 60 MG 24 hr tablet Take 1 tablet (60 mg total) by mouth daily. 30 tablet 2 Past Week at Unknown time  . loratadine (CLARITIN) 10 MG tablet Take 5 mg by mouth daily as needed for allergies.    prn at prn  . LORazepam (ATIVAN) 1 MG tablet Take 1 tablet (1 mg total) by mouth 2 (two) times daily as needed for anxiety. 5 tablet 0 prn at prn  . metoprolol tartrate (LOPRESSOR) 50 MG tablet Take 1 tablet (50 mg total) by mouth 2 (two) times daily. 180 tablet 3 Past Week at  Unknown time  . mirabegron ER (MYRBETRIQ) 25 MG TB24 tablet Take 25 mg by mouth daily.   Past Week at Unknown time  . nitroGLYCERIN (NITROSTAT) 0.4 MG SL tablet Place 0.4 mg under the tongue every 5 (five) minutes as needed for chest pain.   prn at prn  . Omega-3 Fatty Acids (FISH OIL) 1000 MG CAPS Take 1,000 mg by mouth daily.   Past Week at Unknown time  . polyethylene glycol (MIRALAX / GLYCOLAX) packet Take 17 g by mouth daily as needed for mild constipation.   prn at prn  . promethazine (PHENERGAN) 25 MG tablet Take 25 mg by mouth every 6 (six) hours as needed for nausea or vomiting.   prn at prn  . saccharomyces boulardii (FLORASTOR) 250 MG capsule Take 250 mg by mouth daily.   Past Week at Unknown time  . traMADol (ULTRAM) 50 MG tablet Take 1 tablet (50 mg total) by mouth daily as needed. (Patient taking differently: Take 50 mg by mouth daily as needed for moderate pain. ) 5 tablet 0 prn at prn  . warfarin (COUMADIN) 2 MG tablet Take 1 tablet (2 mg total) by mouth as directed. Every Sunday 30 tablet 0 Past Week at Unknown time  . warfarin (COUMADIN) 3 MG tablet Take 1 tablet (3 mg total) by mouth daily at 6 PM. Mon,tue,wed,thur,friday, sat 30 tablet 1 Past Week at Unknown time   Scheduled:  . amiodarone  200 mg Oral BID  . carbidopa-levodopa  1 tablet Oral QID  . chlorhexidine  15 mL Mouth Rinse BID  . docusate sodium  100 mg Oral BID  . escitalopram  20 mg Oral Daily  . feeding supplement (NEPRO CARB STEADY)  237 mL Oral TID BM  . hydrALAZINE  10 mg Oral BID  . insulin aspart  0-9 Units Subcutaneous TID WC  . insulin glargine  5 Units Subcutaneous QHS  . isosorbide mononitrate  30 mg Oral Daily  . mouth rinse  15 mL Mouth Rinse q12n4p  . metoprolol tartrate  100 mg Oral BID  . multivitamin  15 mL Oral Daily  . saccharomyces boulardii  250 mg Oral Daily  . tuberculin  5 Units Intradermal Once  . warfarin  1.5 mg Oral q1800  . Warfarin - Pharmacist Dosing Inpatient   Does not apply  q1800    Assessment: Pharmacy consulted to dose and monitor warfarin therapy in this 76 year old patient chronically on warfarin for Afib.  Home dose: 3 mg daily except 2 mg on sundays.   7/5 INR 2.59 Warfarin 3 mg 7/6 INR 3.20   Dose Held 7/7 INR 4.29 Dose Held  7/8 INR 4.98 Dose Held 7/9       INR 5.26 Dose Held 7/10     INR 2.85          Warfarin 1.41m 7/11:  INR: 2.49             1.5 mg 7/12:   2.67                  Patient has been initiated on amiodarone 7/5  Goal of Therapy:  INR 2-3 Monitor platelets by anticoagulation protocol: Yes   Plan:  Will give warfarin 1.5 mg again today/  TLarene Beach PharmD  Clinical Pharmacist  04/22/2017,4:09 PM

## 2017-04-22 NOTE — H&P (Signed)
[facility] History & Physical Update  The patient was interviewed and re-examined.  The patient's previous History and Physical has been reviewed and is unchanged.  There is no change in the plan of care. We plan to proceed with the scheduled procedure.  Festus BarrenJason Brick Ketcher, MD  04/22/2017, 9:33 AM

## 2017-04-22 NOTE — Progress Notes (Signed)
Post HD  

## 2017-04-22 NOTE — Op Note (Signed)
OPERATIVE NOTE    PRE-OPERATIVE DIAGNOSIS: 1. ESRD   POST-OPERATIVE DIAGNOSIS: same as above  PROCEDURE: 1. Ultrasound guidance for vascular access to the right internal jugular vein 2. Fluoroscopic guidance for placement of catheter 3. Placement of a 19 cm tip to cuff tunneled hemodialysis catheter via the right internal jugular vein  SURGEON: Festus BarrenJason Dew, MD  ANESTHESIA:  Local with Moderate conscious sedation for approximately 15 minutes using 2 mg of Versed and 50 mcg of Fentanyl  ESTIMATED BLOOD LOSS: 15 cc  FLUORO TIME: less than one minute  CONTRAST: none  FINDING(S): 1.  Patent right internal jugular vein  SPECIMEN(S):  None  INDICATIONS:   Molly Wolf is a 76 y.o.female who presents with renal failure.  The patient needs long term dialysis access for their ESRD, and a Permcath is necessary.  Risks and benefits are discussed and informed consent is obtained.    DESCRIPTION: After obtaining full informed written consent, the patient was brought back to the vascular suited. The patient's right neck and chest were sterilely prepped and draped in a sterile surgical field was created. Moderate conscious sedation was administered during a face to face encounter with the patient throughout the procedure with my supervision of the RN administering medicines and monitoring the patient's vital signs, pulse oximetry, telemetry and mental status throughout from the start of the procedure until the patient was taken to the recovery room.  The right internal jugular vein was visualized with ultrasound and found to be patent. It was then accessed under direct ultrasound guidance and a permanent image was recorded. A wire was placed. After skin nick and dilatation, the peel-away sheath was placed over the wire. I then turned my attention to an area under the clavicle. Approximately 1-2 fingerbreadths below the clavicle a small counterincision was created and tunneled from the subclavicular  incision to the access site. Using fluoroscopic guidance, a 19 centimeter tip to cuff tunneled hemodialysis catheter was selected, and tunneled from the subclavicular incision to the access site. It was then placed through the peel-away sheath and the peel-away sheath was removed. Using fluoroscopic guidance the catheter tips were parked in the right atrium. The appropriate distal connectors were placed. It withdrew blood well and flushed easily with heparinized saline and a concentrated heparin solution was then placed. It was secured to the chest wall with 2 Prolene sutures. The access incision was closed single 4-0 Monocryl. A 4-0 Monocryl pursestring suture was placed around the exit site. Sterile dressings were placed. The patient tolerated the procedure well and was taken to the recovery room in stable condition.  COMPLICATIONS: None  CONDITION: Stable  Festus BarrenJason Dew, MD 04/22/2017 10:14 AM   This note was created with Dragon Medical transcription system. Any errors in dictation are purely unintentional.

## 2017-04-23 ENCOUNTER — Inpatient Hospital Stay: Payer: Medicare Other

## 2017-04-23 ENCOUNTER — Inpatient Hospital Stay
Admission: EM | Disposition: A | Discharge status: Skilled Nursing Facility | Payer: Self-pay | Source: Home / Self Care | Attending: Internal Medicine

## 2017-04-23 ENCOUNTER — Encounter: Payer: Self-pay | Admitting: Vascular Surgery

## 2017-04-23 DIAGNOSIS — I82409 Acute embolism and thrombosis of unspecified deep veins of unspecified lower extremity: Secondary | ICD-10-CM

## 2017-04-23 DIAGNOSIS — I2699 Other pulmonary embolism without acute cor pulmonale: Secondary | ICD-10-CM

## 2017-04-23 HISTORY — PX: IVC FILTER INSERTION: CATH118245

## 2017-04-23 LAB — HEPATITIS B CORE ANTIBODY, TOTAL: Hep B Core Total Ab: NEGATIVE

## 2017-04-23 LAB — BASIC METABOLIC PANEL
ANION GAP: 10 (ref 5–15)
BUN: 63 mg/dL — ABNORMAL HIGH (ref 6–20)
CALCIUM: 8.3 mg/dL — AB (ref 8.9–10.3)
CO2: 28 mmol/L (ref 22–32)
Chloride: 101 mmol/L (ref 101–111)
Creatinine, Ser: 3.1 mg/dL — ABNORMAL HIGH (ref 0.44–1.00)
GFR, EST AFRICAN AMERICAN: 16 mL/min — AB (ref >60)
GFR, EST NON AFRICAN AMERICAN: 13 mL/min — AB (ref >60)
GLUCOSE: 234 mg/dL — AB (ref 65–99)
POTASSIUM: 4.7 mmol/L (ref 3.5–5.1)
SODIUM: 139 mmol/L (ref 135–145)

## 2017-04-23 LAB — GLUCOSE, CAPILLARY
Glucose-Capillary: 210 mg/dL — ABNORMAL HIGH (ref 65–99)
Glucose-Capillary: 172 mg/dL — ABNORMAL HIGH (ref 65–99)
Glucose-Capillary: 227 mg/dL — ABNORMAL HIGH (ref 65–99)
Glucose-Capillary: 118 mg/dL — ABNORMAL HIGH (ref 65–99)

## 2017-04-23 LAB — PROTIME-INR
INR: 2.93
PROTHROMBIN TIME: 31.2 s — AB (ref 11.4–15.2)

## 2017-04-23 LAB — HEPATITIS B SURFACE ANTIGEN: Hepatitis B Surface Ag: NEGATIVE

## 2017-04-23 LAB — HEPATITIS B SURFACE ANTIBODY,QUALITATIVE: HEP B S AB: NONREACTIVE

## 2017-04-23 LAB — HEPATITIS C ANTIBODY: HCV Ab: <0.1 s/co ratio (ref 0.0–0.9)

## 2017-04-23 SURGERY — IVC FILTER INSERTION
Anesthesia: Moderate Sedation

## 2017-04-23 MED ORDER — MIDAZOLAM HCL 2 MG/2ML IJ SOLN
INTRAMUSCULAR | Status: DC | PRN
  Administered 2017-04-23: 2 mg via INTRAVENOUS

## 2017-04-23 MED ORDER — CEFAZOLIN SODIUM-DEXTROSE 1-4 GM/50ML-% IV SOLN
1.0000 g | Freq: Once | INTRAVENOUS | Status: AC
  Administered 2017-04-23: 1 g via INTRAVENOUS

## 2017-04-23 MED ORDER — HEPARIN (PORCINE) IN NACL 2-0.9 UNIT/ML-% IJ SOLN
INTRAMUSCULAR | Status: AC
  Filled 2017-04-23: qty 500

## 2017-04-23 MED ORDER — IOPAMIDOL (ISOVUE-300) INJECTION 61%
INTRAVENOUS | Status: DC | PRN
  Administered 2017-04-23: 15 mL via INTRAVENOUS

## 2017-04-23 MED ORDER — SODIUM CHLORIDE 0.9% FLUSH
3.0000 mL | Freq: Two times a day (BID) | INTRAVENOUS | Status: DC
  Administered 2017-04-23 – 2017-04-27 (×8): 3 mL via INTRAVENOUS

## 2017-04-23 MED ORDER — IOPAMIDOL (ISOVUE-300) INJECTION 61%
15.0000 mL | INTRAVENOUS | Status: AC
  Administered 2017-04-23 (×2): 15 mL via ORAL

## 2017-04-23 MED ORDER — FENTANYL CITRATE (PF) 100 MCG/2ML IJ SOLN
INTRAMUSCULAR | Status: AC
  Filled 2017-04-23: qty 2

## 2017-04-23 MED ORDER — LIDOCAINE HCL (PF) 1 % IJ SOLN
INTRAMUSCULAR | Status: AC
  Filled 2017-04-23: qty 30

## 2017-04-23 MED ORDER — MIDAZOLAM HCL 2 MG/2ML IJ SOLN
INTRAMUSCULAR | Status: AC
  Filled 2017-04-23: qty 2

## 2017-04-23 MED ORDER — INSULIN ASPART 100 UNIT/ML ~~LOC~~ SOLN
2.0000 [IU] | Freq: Three times a day (TID) | SUBCUTANEOUS | Status: DC
  Administered 2017-04-23 – 2017-04-27 (×10): 2 [IU] via SUBCUTANEOUS
  Filled 2017-04-23 (×10): qty 1

## 2017-04-23 MED ORDER — TRAMADOL HCL 50 MG PO TABS
50.0000 mg | ORAL_TABLET | Freq: Two times a day (BID) | ORAL | Status: DC | PRN
  Administered 2017-04-23 – 2017-04-25 (×4): 50 mg via ORAL
  Filled 2017-04-23 (×4): qty 1

## 2017-04-23 SURGICAL SUPPLY — 6 items
DEVICE SAFEGUARD 24CM (GAUZE/BANDAGES/DRESSINGS) ×3 IMPLANT
KIT FEMORAL DEL DENALI (Miscellaneous) ×3 IMPLANT
NEEDLE ENTRY 21GA 7CM ECHOTIP (NEEDLE) ×3 IMPLANT
PACK ANGIOGRAPHY (CUSTOM PROCEDURE TRAY) ×3 IMPLANT
SET INTRO CAPELLA COAXIAL (SET/KITS/TRAYS/PACK) ×3 IMPLANT
WIRE J 3MM .035X145CM (WIRE) ×3 IMPLANT

## 2017-04-23 NOTE — Plan of Care (Signed)
Problem: Safety: Goal: Ability to remain free from injury will improve Outcome: Not Progressing Remains pleasantly confused and agitated on return from hemodialysis.  Low bed in place for safety.

## 2017-04-23 NOTE — Progress Notes (Signed)
This note also relates to the following rows which could not be included: Pulse Rate - Cannot attach notes to unvalidated device data Resp - Cannot attach notes to unvalidated device data  HD COMPLETED 

## 2017-04-23 NOTE — Progress Notes (Signed)
Pre dialysis assessment 

## 2017-04-23 NOTE — Care Management (Signed)
PPD has been applied:  Given N/A 5 Units Intradermal Right Arm  04/22/17 1740 04/22/17 1743     Administering User: Grayling CongressLansana, Alhaji, RN  Discussed the need to document as being read 7/14 during progression

## 2017-04-23 NOTE — Progress Notes (Signed)
Spoke with dr. Renae Glosswieting regarding ct abdomen result, hematoma found per md already seen results will discontinue po coumadin

## 2017-04-23 NOTE — Progress Notes (Signed)
Patient ID: Molly Wolf, female   DOB: 1941-02-08, 76 y.o.   MRN: [id number]  ACP note  Came back to speak with patient, daughter and other family members at the bedside.  Discussed that the patient has multiple medical issues at this point.  Today's issue is that she was found to have a rectus abdominis hematoma and lower abdominal pain and I need to stop the Coumadin. I spoke with vascular surgery to place an IVC filter. I will have to hold Coumadin at this point and risk of stroke will be higher off Coumadin.  Nephrology is continuing dialysis to see if uremia is part of the issue why the patient has altered mental status.  I told the family that she has multiple medical issues and needs to be monitored closely.  Patient is a full code  Time spent on ACP discussion 17 minutes  Dr. Alford Highlandichard Genae Strine

## 2017-04-23 NOTE — Progress Notes (Signed)
Hd started  

## 2017-04-23 NOTE — Progress Notes (Signed)
[facility]  Daily Progress Note   Subjective  -  I am called to evaluate the patient is been confirmed she has a rectus sheath hematoma.  Objective Vitals:   04/22/17 1954 04/23/17 0530 04/23/17 0750 04/23/17 1334  BP: (!) 129/92 (!) 160/90 (!) 119/94 119/90  Pulse: 79 83 77 82  Resp: 18 16 19 18   Temp: 98.6 F (37 C) 98.3 F (36.8 C) 97.8 F (36.6 C) 98.3 F (36.8 C)  TempSrc: Oral Oral Oral   SpO2: 100% 93% 98% 99%  Weight:  78.3 kg (172 lb 9.6 oz)    Height:        Intake/Output Summary (Last 24 hours) at 04/23/17 1413 Last data filed at 04/23/17 1333  Gross per 24 hour  Intake              237 ml  Output             1300 ml  Net            -1063 ml    PULM  Normal effort , no use of accessory muscles CV  No JVD, RRR Abd      No distended, tender right lower quadrant VASC  tunneled dialysis catheter right IJ clean dry and intact  Laboratory CBC    Component Value Date/Time   WBC 4.6 04/22/2017 1449   HGB 8.7 (L) 04/22/2017 1449   HCT 27.0 (L) 04/22/2017 1449   PLT 162 04/22/2017 1449    BMET    Component Value Date/Time   NA 139 04/23/2017 0407   K 4.7 04/23/2017 0407   CL 101 04/23/2017 0407   CO2 28 04/23/2017 0407   GLUCOSE 234 (H) 04/23/2017 0407   BUN 63 (H) 04/23/2017 0407   CREATININE 3.10 (H) 04/23/2017 0407   CALCIUM 8.3 (L) 04/23/2017 0407   GFRNONAA 13 (L) 04/23/2017 0407   GFRAA 16 (L) 04/23/2017 0407    Assessment/Planning:   Patient has a spontaneous rectus sheath hematoma with 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 The present time given her comorbidities she is quite sedentary spending the vast majority of time in bed. She will require cessation of her Coumadin therapy and given her history as well as her current clinical situation she is extremely high risk for recurrent DVT and PE. IVC filter is indicated. The risks and benefits of been discussed with the patient her brother and sister-in-law were present in  the room. All questions were answered. The patient agrees to proceed with IVC filter insertion.    Levora DredgeGregory Anuar Walgren  04/23/2017, 2:13 PM

## 2017-04-23 NOTE — Progress Notes (Signed)
[facility], [place] 04/23/17  Subjective:   Patient is more alert today Still reports no pain Appetite remains poor Does not remember going to dialysis yesterday  Objective:  Vital signs in last 24 hours:  Temp:  [97 F (36.1 C)-98.6 F (37 C)] 98.3 F (36.8 C) (07/13 1334) Pulse Rate:  [62-83] 82 (07/13 1334) Resp:  [14-20] 18 (07/13 1334) BP: (111-160)/(72-94) 119/90 (07/13 1334) SpO2:  [93 %-100 %] 99 % (07/13 1334) Weight:  [78.3 kg (172 lb 9.6 oz)] 78.3 kg (172 lb 9.6 oz) (07/13 0530)  Weight change: 0.054 kg (1.9 oz) Filed Weights   04/22/17 0441 04/22/17 1430 04/23/17 0530  Weight: 77.7 kg (171 lb 6.4 oz) 77.8 kg (171 lb 8.3 oz) 78.3 kg (172 lb 9.6 oz)    Intake/Output:    Intake/Output Summary (Last 24 hours) at 04/23/17 1509 Last data filed at 04/23/17 1333  Gross per 24 hour  Intake              237 ml  Output             1300 ml  Net            -1063 ml     Physical Exam: General: Laying in the bed, no acute distress  HEENT anicteric  Neck supple  Pulm/lungs Normal breathing, mild rhonchi at bases  CVS/Heart irregular  Abdomen:  Soft, mild diffuse tenderness  Extremities: Trace edema  Neurologic: Sleepy after the procedure  Skin: Scattered ecchymosis          Basic Metabolic Panel:   Recent Labs Lab 04/19/17 0632 04/20/17 0517 04/21/17 0327 04/22/17 1449 04/23/17 0407  NA 139 138 141 141 139  K 5.2* 5.5* 5.4* 4.4 4.7  CL 103 101 103 101 101  CO2 28 28 29 29 28   GLUCOSE 200* 244* 134* 173* 234*  BUN 77* 84* 90* 75* 63*  CREATININE 3.90* 4.18* 4.51* 3.55* 3.10*  CALCIUM 8.4* 8.4* 8.6* 8.2* 8.3*  PHOS 4.0  --   --  3.7  --      CBC:  Recent Labs Lab 04/18/17 0934 04/19/17 0636 04/20/17 0517 04/21/17 0327 04/22/17 1449  WBC 6.1 4.9  --  5.8 4.6  HGB 9.9* 9.4* 9.3* 9.9* 8.7*  HCT 30.3* 28.8*  --  29.9* 27.0*  MCV 86.2 86.9  --  87.8 88.0  PLT 150 138*  --  152 162      Lab Results   Component Value Date   HEPBSAG Negative 04/22/2017   HEPBSAB Non Reactive 04/22/2017      Microbiology:  Recent Results (from the past 240 hour(s))  C difficile quick scan w PCR reflex     Status: None   Collection Time: 04/17/17  2:50 PM  Result Value Ref Range Status   C Diff antigen NEGATIVE NEGATIVE Final   C Diff toxin NEGATIVE NEGATIVE Final   C Diff interpretation No C. difficile detected.  Final  Gastrointestinal Panel by PCR , Stool     Status: None   Collection Time: 04/17/17  2:50 PM  Result Value Ref Range Status   Campylobacter species NOT DETECTED NOT DETECTED Final   Plesimonas shigelloides NOT DETECTED NOT DETECTED Final   Salmonella species NOT DETECTED NOT DETECTED Final   Yersinia enterocolitica NOT DETECTED NOT DETECTED Final   Vibrio species NOT DETECTED NOT DETECTED Final   Vibrio cholerae NOT DETECTED NOT DETECTED Final   Enteroaggregative E coli (EAEC) NOT DETECTED  NOT DETECTED Final   Enteropathogenic E coli (EPEC) NOT DETECTED NOT DETECTED Final   Enterotoxigenic E coli (ETEC) NOT DETECTED NOT DETECTED Final   Shiga like toxin producing E coli (STEC) NOT DETECTED NOT DETECTED Final   Shigella/Enteroinvasive E coli (EIEC) NOT DETECTED NOT DETECTED Final   Cryptosporidium NOT DETECTED NOT DETECTED Final   Cyclospora cayetanensis NOT DETECTED NOT DETECTED Final   Entamoeba histolytica NOT DETECTED NOT DETECTED Final   Giardia lamblia NOT DETECTED NOT DETECTED Final   Adenovirus F40/41 NOT DETECTED NOT DETECTED Final   Astrovirus NOT DETECTED NOT DETECTED Final   Norovirus GI/GII NOT DETECTED NOT DETECTED Final   Rotavirus A NOT DETECTED NOT DETECTED Final   Sapovirus (I, II, IV, and V) NOT DETECTED NOT DETECTED Final    Coagulation Studies:  Recent Labs  04/21/17 0327 04/22/17 1449 04/23/17 0407  LABPROT 27.4* 29.0* 31.2*  INR 2.49 2.67 2.93    Urinalysis: No results for input(s): COLORURINE, LABSPEC, PHURINE, GLUCOSEU, HGBUR, BILIRUBINUR,  KETONESUR, PROTEINUR, UROBILINOGEN, NITRITE, LEUKOCYTESUR in the last 72 hours.  Invalid input(s): APPERANCEUR    Imaging: Ct Abdomen Pelvis Wo Contrast  Result Date: 04/23/2017 CLINICAL DATA:  Generalized diffuse abd pain, pneumonia, Chronic anemia. Recent GI bleed. Hyperlipidemia unspecified on atorvastatin EXAM: CT ABDOMEN AND PELVIS WITHOUT CONTRAST TECHNIQUE: Multidetector CT imaging of the abdomen and pelvis was performed following the standard protocol without IV contrast. COMPARISON:  CT abdomen dated 12/07/2016. FINDINGS: Lower chest: Small bilateral pleural effusions. Small pericardial effusion at the heart base, grossly stable compared to previous exam. Cardiomegaly, incompletely imaged. Coronary artery calcifications. Hepatobiliary: No focal liver abnormality is seen. Gallbladder appears hyperdense which may indicate stones or sludge. Pancreas: Atrophic.  No acute findings. Spleen: Normal in size without focal abnormality. Adrenals/Urinary Tract: Adrenal glands are unremarkable. Small left renal cyst. No renal stone or hydronephrosis. No ureteral or bladder calculi identified. Stomach/Bowel: No dilated large or small bowel loops. Vascular/Lymphatic: Aortic atherosclerosis. No enlarged lymph nodes seen. Reproductive: Unremarkable. Other: Small amount of free fluid in the upper abdomen. Moderate amount of free fluid in the pelvis. No circumscribed abscess collection seen. No free intraperitoneal air. Musculoskeletal: Degenerative changes of the slightly scoliotic thoracolumbar spine, at least moderate in degree. Acute hematoma within the lower right rectus abdominus musculature, measuring approximately 4.5 cm thickness and approximately 8 cm craniocaudal extent. Ill-defined fluid/edema throughout the subcutaneous soft tissues indicating anasarca. IMPRESSION: 1. Acute spontaneous intramuscular hematoma within the lower right rectus abdominus musculature, just above the level of the bladder,  measuring 4.5 cm thickness and approximately 8 cm craniocaudal extent. 2. Ascites within the upper abdomen and pelvis, small to moderate in size. 3. Small bilateral pleural effusions. 4. Anasarca. 5. Small pericardial effusion appears stable. 6. Cardiomegaly with diffuse coronary artery calcifications. 7. Aortic atherosclerosis. These results were called by telephone at the time of interpretation on 04/23/2017 at 12:05 pm to Dr. Alford Highland , who verbally acknowledged these results. Electronically Signed   By: Bary Richard M.D.   On: 04/23/2017 12:10     Medications:   . sodium chloride     . amiodarone  200 mg Oral BID  . carbidopa-levodopa  1 tablet Oral QID  . chlorhexidine  15 mL Mouth Rinse BID  . docusate sodium  100 mg Oral BID  . escitalopram  20 mg Oral Daily  . feeding supplement (NEPRO CARB STEADY)  237 mL Oral TID BM  . hydrALAZINE  10 mg Oral BID  . insulin aspart  0-9 Units Subcutaneous TID WC  . insulin aspart  2 Units Subcutaneous TID WC  . insulin glargine  5 Units Subcutaneous QHS  . isosorbide mononitrate  30 mg Oral Daily  . mouth rinse  15 mL Mouth Rinse q12n4p  . metoprolol tartrate  100 mg Oral BID  . multivitamin  15 mL Oral Daily  . saccharomyces boulardii  250 mg Oral Daily  . tuberculin  5 Units Intradermal Once   acetaminophen **OR** acetaminophen, bisacodyl, loratadine, LORazepam, nitroGLYCERIN, ondansetron **OR** ondansetron (ZOFRAN) IV, polyethylene glycol, traMADol  Assessment/ Plan:  76 y.o. caucsian  female with coronary artery disease, congestive heart failure, pulmonary hypertension, parkinson's, diabetes mellitus type II, COPD, DVT on warfarin, atrial fibrillation, hyperlipidemia, anemia, anxiety, patellar fracture,  1. Acute renal failure with hyperkalemia on chronic kidney disease stage IV with proteinuria.  Baseline creatinine of 2.5, GFR of 17.  Chronic kidney disease secondary to hypertension and diabetes Acute renal failure secondary to  overdiuresis and hypotension leading to ATN.  Dialysis again today PPD placed start outpatient d/c planning  2. Diabetes mellitus type II with chronic kidney disease: insulin dependent. Hemoglobin A1c 7.5% on 6/24  3. Hyperkalemia - Expected to correct with hemodialysis  4.  Abdominal pain CT shows hematoma Vascular surgery evaluation   LOS: 13 [facility] CenterINGH,Vandana Haman 7/13/20183:09 PM  [facility] [place], [place] [phone]

## 2017-04-23 NOTE — Progress Notes (Signed)
Confused. [facility] New dialysis [facility]. Double lumen subclavian in place. Hemodialysis scheduled for today.

## 2017-04-23 NOTE — Progress Notes (Signed)
Physical Therapy Treatment Patient Details Name: Molly Wolf MRN: [id number] DOB: Sep 15, 1941 Today's Date: 04/23/2017    History of Present Illness Pt is a 76 y.o. female presenting to hospital after recent discharge from hospital (discharge same day) with unresponsiveness; notes report CPR performed.  Pt admitted with acute respiratory failure with hypoxia and testing showing HCA PNA.  During hospitalization pt with acute encephalopathy, worsening renal function and R IJ permcath placed 04/22/17 for HD, and pt c/o lower abdominal pain.  Pt with recent admission for acute exacerbation of chronic combined systolic and diastolic heart failure, a-fib with RVR, AKI, and anemia.  PMH includes CHF, CKD, CAD, COPD, DM, DVT, back and neck surgery, and Parkinson's disease.    PT Comments    Pt reporting 10/10 abdominal pain beginning of session and pt did not want to participate in much therapy d/t this pain but was agreeable to get up to bedside commode to attempt to void.  Pt SBA supine to/from sit and min to mod assist to stand and transfer to/from bedside commode.  Pt reporting feeling urge to urinate but unable to void sitting on bedside commode (nursing notified).  Pt demonstrating generalized weakness from extended hospital stay.  Abdominal pain improved to 7/10 by end of session (pt with recent pain meds per pt and nursing).    Follow Up Recommendations  SNF     Equipment Recommendations  Rolling walker with 5" wheels    Recommendations for Other Services       Precautions / Restrictions Precautions Precautions: Fall Precaution Comments: R IJ permcath Restrictions Weight Bearing Restrictions: No    Mobility  Bed Mobility Overal bed mobility: Needs Assistance Bed Mobility: Supine to Sit;Sit to Supine     Supine to sit: Supervision;HOB elevated Sit to supine: Supervision;HOB elevated   General bed mobility comments: moderate increased effort to perform; use of bed  rail  Transfers Overall transfer level: Needs assistance Equipment used: None Transfers: Sit to/from UGI CorporationStand;Stand Pivot Transfers Sit to Stand: Min assist;Mod assist Stand pivot transfers: Min assist       General transfer comment: min to mod assist to initiate stand; min assist to steady stand step turn bed to/from bedside commode  Ambulation/Gait             General Gait Details: pt declined d/t abdominal pain and fatigue   Stairs            Wheelchair Mobility    Modified Rankin (Stroke Patients Only)       Balance Overall balance assessment: Needs assistance Sitting-balance support: Bilateral upper extremity supported;Feet supported Sitting balance-Leahy Scale: Good Sitting balance - Comments: sitting reaching within BOS   Standing balance support: Bilateral upper extremity supported (on therapist) Standing balance-Leahy Scale: Fair Standing balance comment: static standing                            Cognition Arousal/Alertness: Awake/alert Behavior During Therapy: WFL for tasks assessed/performed Overall Cognitive Status: Within Functional Limits for tasks assessed (Oriented to person, place, situation, and month/day (but not year))                                        Exercises      General Comments General comments (skin integrity, edema, etc.): Pt resting in bed watching TV upon PT arrival.  Nursing cleared pt for  participation in physical therapy.  Pt agreeable to limited PT session.      Pertinent Vitals/Pain Pain Assessment: 0-10 Pain Score: 7  Pain Location: abdominal Pain Descriptors / Indicators: Tender Pain Intervention(s): Limited activity within patient's tolerance;Monitored during session;Premedicated before session;Repositioned (Nursing notified)  Vitals (HR and O2 on room air) stable and WFL throughout treatment session.    Home Living                      Prior Function            PT  Goals (current goals can now be found in the care plan section) Acute Rehab PT Goals Patient Stated Goal: to get stronger PT Goal Formulation: With patient Time For Goal Achievement: 04/26/17 Potential to Achieve Goals: Good Progress towards PT goals: Progressing toward goals    Frequency    Min 2X/week      PT Plan Current plan remains appropriate    Co-evaluation              AM-PAC PT "6 Clicks" Daily Activity  Outcome Measure  Difficulty turning over in bed (including adjusting bedclothes, sheets and blankets)?: A Little Difficulty moving from lying on back to sitting on the side of the bed? : A Lot Difficulty sitting down on and standing up from a chair with arms (e.g., wheelchair, bedside commode, etc,.)?: Total Help needed moving to and from a bed to chair (including a wheelchair)?: A Lot Help needed walking in hospital room?: A Lot Help needed climbing 3-5 steps with a railing? : A Lot 6 Click Score: 12    End of Session Equipment Utilized During Treatment: Gait belt Activity Tolerance: Patient limited by fatigue;Patient limited by pain Patient left: in bed;with call bell/phone within reach;with bed alarm set Nurse Communication: Mobility status;Precautions (Pt's pain status) PT Visit Diagnosis: Muscle weakness (generalized) (M62.81);History of falling (Z91.81);Difficulty in walking, not elsewhere classified (R26.2)     Time: 1040-1105 PT Time Calculation (min) (ACUTE ONLY): 25 min  Charges:  $Therapeutic Activity: 23-37 mins                    G CodesHendricks Limes, PT 04/23/17, 1:52 PM [phone]

## 2017-04-23 NOTE — Progress Notes (Signed)
POST DIALYSIS ASSESSMENT 

## 2017-04-23 NOTE — Op Note (Signed)
[facility]   OPERATIVE NOTE    PRE-OPERATIVE DIAGNOSIS: DVT with PE  POST-OPERATIVE DIAGNOSIS: Same  PROCEDURE: 1.   Ultrasound guidance for vascular access to the right common femoral vein 2.   Catheter placement into the inferior vena cava 3.   Inferior venacavogram 4.   Placement of a Denali IVC filter  SURGEON: Levora DredgeGregory Schnier  ASSISTANT(S): None  ANESTHESIA: Conscious sedation was administered by the interventional radiology RN under my direct supervision. IV Versed plus fentanyl were utilized. Continuous ECG, pulse oximetry and blood pressure was monitored throughout the entire procedure. Conscious sedation was for a total of 20 minutes.  ESTIMATED BLOOD LOSS: minimal  FINDING(S): 1.  Patent IVC  SPECIMEN(S):  none  INDICATIONS:   Molly Wolf is a 76 y.o. y.o. female who presents with rectus hematoma and a history of DVT of the lower extremity on Coumadin.  Given her bleeding anticoagulation must be stopped.  Inferior vena cava filter is indicated for this reason.  Risks and benefits including filter thrombosis, migration, fracture, bleeding, and infection were all discussed.  We discussed that all IVC filters that we place can be removed if desired from the patient once the need for the filter has passed.    DESCRIPTION: After obtaining full informed written consent, the patient was brought back to the vascular suite. The skin was sterilely prepped and draped in a sterile surgical field was created. Ultrasound was placed in a sterile sleeve. The right groin was echolucent and compressible indicating patency. Image was recorded for the permanent record. The puncture was made under continuous real-time ultrasound guidance.  The right common femoral vein was accessed under direct ultrasound guidance without difficulty with a micropuncture needle. Microwire was then advanced under fluoroscopic guidance without difficulty. Micro-sheath was then inserted  and a J-wire was then placed. The dilator is passed over the wire and the delivery sheath was placed into the inferior vena cava.  Inferior venacavogram was performed. This demonstrated a patent IVC with the level of the renal veins at the top of L2.  The filter was then deployed into the inferior vena cava at the level of the mid L2 vertebral body just below the renal veins. The delivery sheath was then removed. Pressure was held. Sterile dressings were placed. The patient tolerated the procedure well and was taken to the recovery room in stable condition.  Interpretation: Inferior vena cava is opacified with a bolus injection contrast. The computer was then calibrated with the markers on the delivery sheath. The cava was then measured and is 22 mm in diameter. IVC filter and Ollie type is deployed with its tip at the mid L2 level  COMPLICATIONS: None  CONDITION: Stable  Levora DredgeGregory Schnier  04/23/2017, 4:22 PM

## 2017-04-23 NOTE — Care Management (Signed)
Results for Molly Wolf, Molly Wolf (MRN [id number]) as of 04/23/2017 09:23  Ref. Range 04/22/2017 14:49  Hepatitis B Surface Ag Latest Ref Range: Negative  Negative  Hep B S Ab Unknown Non Reactive  Hep B Core Ab, Tot Latest Ref Range: Negative  Negative  HCV Ab Latest Ref Range: 0.0 - 0.9 s/co ratio <0.1

## 2017-04-23 NOTE — Progress Notes (Signed)
Patient ID: Molly Wolf, female   DOB: [birth year], 76 y.o.   MRN: [id number]   [facility] PROGRESS NOTE  Molly Wolf [id number] DOB: August 25, 1941 DOA: 04/09/2017 PCP: Darliss Ridgel, MD  HPI/Subjective: Patient seen earlier this morning and complained of abdominal pain. No nausea or vomiting. She did have diarrhea the other day with Kayexalate treatment. Appetite okay.  Objective: Vitals:   04/23/17 1334 04/23/17 1521  BP: 119/90 (!) 144/71  Pulse: 82   Resp: 18 (!) 28  Temp: 98.3 F (36.8 C)     Filed Weights   04/22/17 0441 04/22/17 1430 04/23/17 0530  Weight: 77.7 kg (171 lb 6.4 oz) 77.8 kg (171 lb 8.3 oz) 78.3 kg (172 lb 9.6 oz)    ROS: Review of Systems  Constitutional: Negative for chills and fever.  Eyes: Negative for blurred vision.  Respiratory: Negative for cough and shortness of breath.   Cardiovascular: Negative for chest pain.  Gastrointestinal: Positive for abdominal pain. Negative for constipation, diarrhea, nausea and vomiting.  Genitourinary: Negative for dysuria.  Musculoskeletal: Negative for joint pain.  Neurological: Negative for headaches.   Exam: Physical Exam  HENT:  Nose: No mucosal edema.  Mouth/Throat: No oropharyngeal exudate or posterior oropharyngeal edema.  Eyes: Pupils are equal, round, and reactive to light. Conjunctivae, EOM and lids are normal.  Neck: No JVD present. Carotid bruit is not present. No edema present. No thyroid mass and no thyromegaly present.  Cardiovascular: S1 normal and S2 normal.  Exam reveals no gallop.   No murmur heard. Pulses:      Dorsalis pedis pulses are 2+ on the right side, and 2+ on the left side.  Respiratory: No respiratory distress. She has decreased breath sounds in the right lower field and the left lower field. She has no wheezes. She has no rhonchi. She has no rales.  GI: Soft. Bowel sounds are normal. There is tenderness in the suprapubic area.  Musculoskeletal:       Right ankle: She  exhibits no swelling.       Left ankle: She exhibits no swelling.  Lymphadenopathy:    She has no cervical adenopathy.  Neurological: She is alert. No cranial nerve deficit.  Skin: Skin is warm. No rash noted. Nails show no clubbing.  Psychiatric: She has a normal mood and affect.      Data Reviewed: Basic Metabolic Panel:  Recent Labs Lab 04/19/17 0632 04/20/17 0517 04/21/17 0327 04/22/17 1449 04/23/17 0407  NA 139 138 141 141 139  K 5.2* 5.5* 5.4* 4.4 4.7  CL 103 101 103 101 101  CO2 28 28 29 29 28   GLUCOSE 200* 244* 134* 173* 234*  BUN 77* 84* 90* 75* 63*  CREATININE 3.90* 4.18* 4.51* 3.55* 3.10*  CALCIUM 8.4* 8.4* 8.6* 8.2* 8.3*  PHOS 4.0  --   --  3.7  --    CBC:  Recent Labs Lab 04/18/17 0934 04/19/17 0636 04/20/17 0517 04/21/17 0327 04/22/17 1449  WBC 6.1 4.9  --  5.8 4.6  HGB 9.9* 9.4* 9.3* 9.9* 8.7*  HCT 30.3* 28.8*  --  29.9* 27.0*  MCV 86.2 86.9  --  87.8 88.0  PLT 150 138*  --  152 162   BNP (last 3 results)  Recent Labs  01/26/17 1112 04/03/17 1918 04/05/17 0453  BNP 2,049.0* 1,139.0* 980.0*     CBG:  Recent Labs Lab 04/22/17 1145 04/22/17 1723 04/22/17 2102 04/23/17 0733 04/23/17 1159  GLUCAP 96 151* 237* 210* 172*  Recent Results (from the past 240 hour(s))  C difficile quick scan w PCR reflex     Status: None   Collection Time: 04/17/17  2:50 PM  Result Value Ref Range Status   C Diff antigen NEGATIVE NEGATIVE Final   C Diff toxin NEGATIVE NEGATIVE Final   C Diff interpretation No C. difficile detected.  Final  Gastrointestinal Panel by PCR , Stool     Status: None   Collection Time: 04/17/17  2:50 PM  Result Value Ref Range Status   Campylobacter species NOT DETECTED NOT DETECTED Final   Plesimonas shigelloides NOT DETECTED NOT DETECTED Final   Salmonella species NOT DETECTED NOT DETECTED Final   Yersinia enterocolitica NOT DETECTED NOT DETECTED Final   Vibrio species NOT DETECTED NOT DETECTED Final   Vibrio  cholerae NOT DETECTED NOT DETECTED Final   Enteroaggregative E coli (EAEC) NOT DETECTED NOT DETECTED Final   Enteropathogenic E coli (EPEC) NOT DETECTED NOT DETECTED Final   Enterotoxigenic E coli (ETEC) NOT DETECTED NOT DETECTED Final   Shiga like toxin producing E coli (STEC) NOT DETECTED NOT DETECTED Final   Shigella/Enteroinvasive E coli (EIEC) NOT DETECTED NOT DETECTED Final   Cryptosporidium NOT DETECTED NOT DETECTED Final   Cyclospora cayetanensis NOT DETECTED NOT DETECTED Final   Entamoeba histolytica NOT DETECTED NOT DETECTED Final   Giardia lamblia NOT DETECTED NOT DETECTED Final   Adenovirus F40/41 NOT DETECTED NOT DETECTED Final   Astrovirus NOT DETECTED NOT DETECTED Final   Norovirus GI/GII NOT DETECTED NOT DETECTED Final   Rotavirus A NOT DETECTED NOT DETECTED Final   Sapovirus (I, II, IV, and V) NOT DETECTED NOT DETECTED Final     Scheduled Meds: . [MAR Hold] amiodarone  200 mg Oral BID  . [MAR Hold] carbidopa-levodopa  1 tablet Oral QID  . [MAR Hold] chlorhexidine  15 mL Mouth Rinse BID  . [MAR Hold] docusate sodium  100 mg Oral BID  . [MAR Hold] escitalopram  20 mg Oral Daily  . [MAR Hold] feeding supplement (NEPRO CARB STEADY)  237 mL Oral TID BM  . [MAR Hold] hydrALAZINE  10 mg Oral BID  . [MAR Hold] insulin aspart  0-9 Units Subcutaneous TID WC  . [MAR Hold] insulin aspart  2 Units Subcutaneous TID WC  . [MAR Hold] insulin glargine  5 Units Subcutaneous QHS  . [MAR Hold] isosorbide mononitrate  30 mg Oral Daily  . [MAR Hold] mouth rinse  15 mL Mouth Rinse q12n4p  . [MAR Hold] metoprolol tartrate  100 mg Oral BID  . [MAR Hold] multivitamin  15 mL Oral Daily  . [MAR Hold] saccharomyces boulardii  250 mg Oral Daily  . tuberculin  5 Units Intradermal Once    Assessment/Plan:  1. Suprapubic abdominal pain with Rectus abdominis hematoma measuring 4.5 x 8 cm seen on CT scan. Stop Coumadin. Consulted vascular surgery for possible IVC filter placement. Continue to  watch Coumadin level drift down. 2. Acute kidney injury on chronic kidney disease stage IV.  Dialysis started yesterday. Uremia could be contributing to the patient's altered mental status. 3. Hyperkalemia secondary to acute kidney injury. Resolved with dialysis 4. Acute respiratory failure. Tapered off oxygen. 5. Acute encephalopathy. Continue to be careful with any medications that can cause altered mental status. Uremia could be contributing. Could be secondary to other medical issues 2. 6. Pneumonia. Received 5 days of antibiotic and then discontinued since pro calcitonin was negative. 7. History of Parkinson's on Sinemet 8. Type 2 diabetes mellitus.  Continue Lantus 5 units subcutaneous injection daily and sliding scale with standing dose insulin prior to meals also. 9. Acute on chronic systolic congestive heart failure.  Continue metoprolol. No ACE inhibitor or arm secondary to acute kidney injury. 10. Atrial fibrillation with rapid ventricular rate on metoprolol and amiodarone at this point with better control of heart rate. Need to hold Coumadin with rectus abdominis bleed. Risk of stroke will be higher being off Coumadin 11. Chronic anemia. Recent GI bleed. 12. Hyperlipidemia unspecified on atorvastatin 13. History of DVT on Coumadin. Need to hold Coumadin at this point with rectus abdominis bleed. Consulted vascular surgery for IVC filter placement 14. Palliative care consultation appreciated   Code Status:     Code Status Orders        Start     Ordered   04/10/17 0328  Full code  Continuous     04/10/17 0327    Code Status History    Date Active Date Inactive Code Status Order ID Comments User Context   04/03/2017 11:30 PM 04/09/2017 10:09 PM Full Code [id number]  Hugelmeyer, Jon Gills, DO ED   02/15/2017  6:16 PM 02/16/2017  6:18 PM Full Code [id number]  Altamese Dilling, MD Inpatient   01/26/2017  2:44 PM 01/29/2017  9:47 PM Full Code [id number]  Alford Highland, MD ED    12/28/2016  7:44 AM 12/29/2016  4:34 PM DNR [id number]  Delfino Lovett, MD Inpatient   12/27/2016 11:06 PM 12/28/2016  7:44 AM Full Code [id number]  Hugelmeyer, Jon Gills, DO Inpatient    Advance Directive Documentation     Most Recent Value  Type of Advance Directive  Healthcare Power of Attorney  Pre-existing out of facility DNR order (yellow form or pink MOST form)  -  "MOST" Form in Place?  -     Family Communication: Spoke with the daughter on the phone this morning. Spoke with the daughter face-to-face this afternoon at the bedside please see next note. Disposition Plan: TBD  Time spent: 25 minutes  Alford Highland  [employer]

## 2017-04-23 NOTE — Progress Notes (Signed)
ANTICOAGULATION CONSULT NOTE - follow up  Pharmacy Consult for warfarin Indication: atrial fibrillation  No Known Allergies  Patient Measurements: Height: 5' 7"  (170.2 cm) Weight: 172 lb 9.6 oz (78.3 kg) IBW/kg (Calculated) : 61.6 Heparin Dosing Weight:   Vital Signs: Temp: 98.3 F (36.8 C) (07/13 0530) Temp Source: Oral (07/13 0530) BP: 160/90 (07/13 0530) Pulse Rate: 83 (07/13 0530)  Labs:  Recent Labs  04/21/17 0327 04/22/17 1449 04/23/17 0407  HGB 9.9* 8.7*  --   HCT 29.9* 27.0*  --   PLT 152 162  --   LABPROT 27.4* 29.0* 31.2*  INR 2.49 2.67 2.93  CREATININE 4.51* 3.55* 3.10*    Estimated Creatinine Clearance: 16.4 mL/min (A) (by C-G formula based on SCr of 3.1 mg/dL (H)).   Medical History: Past Medical History:  Diagnosis Date  . Anxiety   . Arthritis   . Chronic combined systolic and diastolic CHF (congestive heart failure) ([place])    a. 01/2008 Echo ([facility]): EF > 55%, mod-sev LVH w/ grade 1 DD, biatrial enlargement, mild AS, trace MR/TR; b. EF 45-50%, GR2DD, mild to moderate aortic stenosis, mild aortic insufficieny, mild to moderate mitral regurgitation, mild tricuspid regurgitation, mild biatrial enlargement, and a small pericardial effusion  . CKD (chronic kidney disease), stage IV ([place])   . Closed patellar sleeve fracture of right knee    a. 01/2017 - conservatively managed.  Marland Kitchen COPD (chronic obstructive pulmonary disease) ([place])   . Coronary artery disease    a. s/p remote stenting of unknown vessel @ [facility].  . Depression   . Diabetes mellitus with complication ([place])   . DVT (deep venous thrombosis) ([facility])    a. in the setting of right patellar fracture on Coumadin  . Hyperlipidemia   . Hypertension   . Parkinson disease ([place])     Medications:  Prescriptions Prior to Admission  Medication Sig Dispense Refill Last Dose  . acetaminophen (TYLENOL) 325 MG tablet Take 650 mg by mouth every 6 (six) hours as needed for mild pain.   prn at prn  .  atorvastatin (LIPITOR) 20 MG tablet Take 20 mg by mouth daily.   Past Week at Unknown time  . blood glucose meter kit and supplies KIT Dispense based on patient and insurance preference. Use up to four times daily as directed. (FOR ICD-9 250.00, 250.01). 1 each 0 Past Week at Unknown time  . Carbidopa-Levodopa ER (SINEMET CR) 25-100 MG tablet controlled release Take 2 tablets by mouth 4 (four) times daily. 240 tablet 1 Past Week at Unknown time  . Coenzyme Q10 (COQ10) 50 MG CAPS Take 50 mg by mouth daily.   Past Week at Unknown time  . CRANBERRY PO Take 1 tablet by mouth daily.   Past Week at Unknown time  . escitalopram (LEXAPRO) 20 MG tablet Take 20 mg by mouth daily.   Past Week at Unknown time  . furosemide (LASIX) 20 MG tablet Take 1 tablet (20 mg total) by mouth daily. 30 tablet 2 Past Week at Unknown time  . gabapentin (NEURONTIN) 300 MG capsule Take 300 mg by mouth daily.    Past Week at Unknown time  . glucose blood (ACCU-CHEK AVIVA PLUS) test strip Use as instructed 100 each 12 Past Week at Unknown time  . hydrALAZINE (APRESOLINE) 25 MG tablet Take 1 tablet (25 mg total) by mouth 2 (two) times daily. 180 tablet 3 Past Week at Unknown time  . insulin glargine (LANTUS) 100 UNIT/ML injection Inject 40 Units into  the skin at bedtime.    Past Week at Unknown time  . insulin lispro (HUMALOG) 100 UNIT/ML injection Inject 6 Units into the skin 3 (three) times daily with meals.    Past Week at Unknown time  . isosorbide mononitrate (IMDUR) 60 MG 24 hr tablet Take 1 tablet (60 mg total) by mouth daily. 30 tablet 2 Past Week at Unknown time  . loratadine (CLARITIN) 10 MG tablet Take 5 mg by mouth daily as needed for allergies.    prn at prn  . LORazepam (ATIVAN) 1 MG tablet Take 1 tablet (1 mg total) by mouth 2 (two) times daily as needed for anxiety. 5 tablet 0 prn at prn  . metoprolol tartrate (LOPRESSOR) 50 MG tablet Take 1 tablet (50 mg total) by mouth 2 (two) times daily. 180 tablet 3 Past Week at  Unknown time  . mirabegron ER (MYRBETRIQ) 25 MG TB24 tablet Take 25 mg by mouth daily.   Past Week at Unknown time  . nitroGLYCERIN (NITROSTAT) 0.4 MG SL tablet Place 0.4 mg under the tongue every 5 (five) minutes as needed for chest pain.   prn at prn  . Omega-3 Fatty Acids (FISH OIL) 1000 MG CAPS Take 1,000 mg by mouth daily.   Past Week at Unknown time  . polyethylene glycol (MIRALAX / GLYCOLAX) packet Take 17 g by mouth daily as needed for mild constipation.   prn at prn  . promethazine (PHENERGAN) 25 MG tablet Take 25 mg by mouth every 6 (six) hours as needed for nausea or vomiting.   prn at prn  . saccharomyces boulardii (FLORASTOR) 250 MG capsule Take 250 mg by mouth daily.   Past Week at Unknown time  . traMADol (ULTRAM) 50 MG tablet Take 1 tablet (50 mg total) by mouth daily as needed. (Patient taking differently: Take 50 mg by mouth daily as needed for moderate pain. ) 5 tablet 0 prn at prn  . warfarin (COUMADIN) 2 MG tablet Take 1 tablet (2 mg total) by mouth as directed. Every Sunday 30 tablet 0 Past Week at Unknown time  . warfarin (COUMADIN) 3 MG tablet Take 1 tablet (3 mg total) by mouth daily at 6 PM. Mon,tue,wed,thur,friday, sat 30 tablet 1 Past Week at Unknown time   Scheduled:  . amiodarone  200 mg Oral BID  . carbidopa-levodopa  1 tablet Oral QID  . chlorhexidine  15 mL Mouth Rinse BID  . docusate sodium  100 mg Oral BID  . escitalopram  20 mg Oral Daily  . feeding supplement (NEPRO CARB STEADY)  237 mL Oral TID BM  . hydrALAZINE  10 mg Oral BID  . insulin aspart  0-9 Units Subcutaneous TID WC  . insulin glargine  5 Units Subcutaneous QHS  . isosorbide mononitrate  30 mg Oral Daily  . mouth rinse  15 mL Mouth Rinse q12n4p  . metoprolol tartrate  100 mg Oral BID  . multivitamin  15 mL Oral Daily  . saccharomyces boulardii  250 mg Oral Daily  . tuberculin  5 Units Intradermal Once  . warfarin  1.5 mg Oral q1800  . Warfarin - Pharmacist Dosing Inpatient   Does not apply  q1800    Assessment: Pharmacy consulted to dose and monitor warfarin therapy in this 76 year old patient chronically on warfarin for Afib.  Home dose: 3 mg daily except 2 mg on sundays.   7/5 INR 2.59 Warfarin 3 mg 7/6 INR 3.20   Dose Held 7/7 INR 4.29  Dose Held 7/8 INR 4.98 Dose Held 7/9       INR 5.26 Dose Held 7/10     INR 2.85          Warfarin 1.27m 7/11:  INR: 2.49             1.5 mg 7/12:   2.67     1.568m7/13    2.936               Patient has been initiated on amiodarone 7/5  Goal of Therapy:  INR 2-3 Monitor platelets by anticoagulation protocol: Yes   Plan:  Continue warfarin 1.69m66maily. Recheck INR in the AM. Pt started HD yesterday  MelRamond Dialharm.D, BCPS Clinical Pharmacist 04/23/2017,7:37 AM

## 2017-04-24 LAB — GLUCOSE, CAPILLARY
Glucose-Capillary: 134 mg/dL — ABNORMAL HIGH (ref 65–99)
Glucose-Capillary: 162 mg/dL — ABNORMAL HIGH (ref 65–99)
Glucose-Capillary: 142 mg/dL — ABNORMAL HIGH (ref 65–99)
Glucose-Capillary: 129 mg/dL — ABNORMAL HIGH (ref 65–99)

## 2017-04-24 LAB — PROTIME-INR
INR: 2.71
Prothrombin Time: 29.3 seconds — ABNORMAL HIGH (ref 11.4–15.2)

## 2017-04-24 LAB — BASIC METABOLIC PANEL
Anion gap: 9 (ref 5–15)
BUN: 37 mg/dL — ABNORMAL HIGH (ref 6–20)
CALCIUM: 8.2 mg/dL — AB (ref 8.9–10.3)
CO2: 30 mmol/L (ref 22–32)
Chloride: 100 mmol/L — ABNORMAL LOW (ref 101–111)
Creatinine, Ser: 2.27 mg/dL — ABNORMAL HIGH (ref 0.44–1.00)
GFR calc Af Amer: 23 mL/min — ABNORMAL LOW (ref >60)
GFR calc non Af Amer: 20 mL/min — ABNORMAL LOW (ref >60)
GLUCOSE: 132 mg/dL — AB (ref 65–99)
Potassium: 4.3 mmol/L (ref 3.5–5.1)
Sodium: 139 mmol/L (ref 135–145)

## 2017-04-24 LAB — HEMOGLOBIN: Hemoglobin: 8.7 g/dL — ABNORMAL LOW (ref 12.0–16.0)

## 2017-04-24 MED ORDER — PHYTONADIONE 5 MG PO TABS
5.0000 mg | ORAL_TABLET | Freq: Once | ORAL | Status: AC
  Administered 2017-04-24: 5 mg via ORAL
  Filled 2017-04-24: qty 1

## 2017-04-24 NOTE — Progress Notes (Signed)
[facility], [place] 04/24/17  Subjective:   Patient seen during dialysis Tolerating well    HEMODIALYSIS FLOWSHEET:  Blood Flow Rate (mL/min): 250 mL/min Arterial Pressure (mmHg): -80 mmHg Venous Pressure (mmHg): 70 mmHg Transmembrane Pressure (mmHg): 80 mmHg Ultrafiltration Rate (mL/min): 1000 mL/min Dialysate Flow Rate (mL/min): 500 ml/min Conductivity: Machine : 13.8 Conductivity: Machine : 13.8 Dialysis Fluid Bolus: Normal Saline Bolus Amount (mL): 250 mL    Objective:  Vital signs in last 24 hours:  Temp:  [97.5 F (36.4 C)-98.8 F (37.1 C)] 98.8 F (37.1 C) (07/14 1220) Pulse Rate:  [73-96] 91 (07/14 1230) Resp:  [11-28] 16 (07/14 1230) BP: (119-165)/(71-122) 145/89 (07/14 1220) SpO2:  [90 %-100 %] 100 % (07/14 1200) Weight:  [75.1 kg (165 lb 9.6 oz)-75.7 kg (166 lb 12.8 oz)] 75.7 kg (166 lb 12.8 oz) (07/14 0419)  Weight change: -2.684 kg (-5 lb 14.7 oz) Filed Weights   04/23/17 0530 04/23/17 2138 04/24/17 0419  Weight: 78.3 kg (172 lb 9.6 oz) 75.1 kg (165 lb 9.6 oz) 75.7 kg (166 lb 12.8 oz)    Intake/Output:    Intake/Output Summary (Last 24 hours) at 04/24/17 1259 Last data filed at 04/24/17 1220  Gross per 24 hour  Intake              237 ml  Output             3100 ml  Net            -2863 ml     Physical Exam: General: Laying in the bed, no acute distress  HEENT anicteric  Neck supple  Pulm/lungs Normal breathing, mild rhonchi at bases  CVS/Heart irregular  Abdomen:  Soft, mild diffuse tenderness  Extremities: Trace edema  Neurologic: Sleepy today  Skin: Scattered ecchymosis          Basic Metabolic Panel:   Recent Labs Lab 04/19/17 0632 04/20/17 0517 04/21/17 0327 04/22/17 1449 04/23/17 0407 04/24/17 0733  NA 139 138 141 141 139 139  K 5.2* 5.5* 5.4* 4.4 4.7 4.3  CL 103 101 103 101 101 100*  CO2 28 28 29 29 28 30   GLUCOSE 200* 244* 134* 173* 234* 132*  BUN 77* 84* 90* 75* 63* 37*  CREATININE  3.90* 4.18* 4.51* 3.55* 3.10* 2.27*  CALCIUM 8.4* 8.4* 8.6* 8.2* 8.3* 8.2*  PHOS 4.0  --   --  3.7  --   --      CBC:  Recent Labs Lab 04/18/17 0934 04/19/17 0636 04/20/17 0517 04/21/17 0327 04/22/17 1449 04/24/17 0733  WBC 6.1 4.9  --  5.8 4.6  --   HGB 9.9* 9.4* 9.3* 9.9* 8.7* 8.7*  HCT 30.3* 28.8*  --  29.9* 27.0*  --   MCV 86.2 86.9  --  87.8 88.0  --   PLT 150 138*  --  152 162  --       Lab Results  Component Value Date   HEPBSAG Negative 04/22/2017   HEPBSAB Non Reactive 04/22/2017      Microbiology:  Recent Results (from the past 240 hour(s))  C difficile quick scan w PCR reflex     Status: None   Collection Time: 04/17/17  2:50 PM  Result Value Ref Range Status   C Diff antigen NEGATIVE NEGATIVE Final   C Diff toxin NEGATIVE NEGATIVE Final   C Diff interpretation No C. difficile detected.  Final  Gastrointestinal Panel by PCR , Stool  Status: None   Collection Time: 04/17/17  2:50 PM  Result Value Ref Range Status   Campylobacter species NOT DETECTED NOT DETECTED Final   Plesimonas shigelloides NOT DETECTED NOT DETECTED Final   Salmonella species NOT DETECTED NOT DETECTED Final   Yersinia enterocolitica NOT DETECTED NOT DETECTED Final   Vibrio species NOT DETECTED NOT DETECTED Final   Vibrio cholerae NOT DETECTED NOT DETECTED Final   Enteroaggregative E coli (EAEC) NOT DETECTED NOT DETECTED Final   Enteropathogenic E coli (EPEC) NOT DETECTED NOT DETECTED Final   Enterotoxigenic E coli (ETEC) NOT DETECTED NOT DETECTED Final   Shiga like toxin producing E coli (STEC) NOT DETECTED NOT DETECTED Final   Shigella/Enteroinvasive E coli (EIEC) NOT DETECTED NOT DETECTED Final   Cryptosporidium NOT DETECTED NOT DETECTED Final   Cyclospora cayetanensis NOT DETECTED NOT DETECTED Final   Entamoeba histolytica NOT DETECTED NOT DETECTED Final   Giardia lamblia NOT DETECTED NOT DETECTED Final   Adenovirus F40/41 NOT DETECTED NOT DETECTED Final   Astrovirus NOT  DETECTED NOT DETECTED Final   Norovirus GI/GII NOT DETECTED NOT DETECTED Final   Rotavirus A NOT DETECTED NOT DETECTED Final   Sapovirus (I, II, IV, and V) NOT DETECTED NOT DETECTED Final    Coagulation Studies:  Recent Labs  04/22/17 1449 04/23/17 0407 04/24/17 0733  LABPROT 29.0* 31.2* 29.3*  INR 2.67 2.93 2.71    Urinalysis: No results for input(s): COLORURINE, LABSPEC, PHURINE, GLUCOSEU, HGBUR, BILIRUBINUR, KETONESUR, PROTEINUR, UROBILINOGEN, NITRITE, LEUKOCYTESUR in the last 72 hours.  Invalid input(s): APPERANCEUR    Imaging: Ct Abdomen Pelvis Wo Contrast  Result Date: 04/23/2017 CLINICAL DATA:  Generalized diffuse abd pain, pneumonia, Chronic anemia. Recent GI bleed. Hyperlipidemia unspecified on atorvastatin EXAM: CT ABDOMEN AND PELVIS WITHOUT CONTRAST TECHNIQUE: Multidetector CT imaging of the abdomen and pelvis was performed following the standard protocol without IV contrast. COMPARISON:  CT abdomen dated 12/07/2016. FINDINGS: Lower chest: Small bilateral pleural effusions. Small pericardial effusion at the heart base, grossly stable compared to previous exam. Cardiomegaly, incompletely imaged. Coronary artery calcifications. Hepatobiliary: No focal liver abnormality is seen. Gallbladder appears hyperdense which may indicate stones or sludge. Pancreas: Atrophic.  No acute findings. Spleen: Normal in size without focal abnormality. Adrenals/Urinary Tract: Adrenal glands are unremarkable. Small left renal cyst. No renal stone or hydronephrosis. No ureteral or bladder calculi identified. Stomach/Bowel: No dilated large or small bowel loops. Vascular/Lymphatic: Aortic atherosclerosis. No enlarged lymph nodes seen. Reproductive: Unremarkable. Other: Small amount of free fluid in the upper abdomen. Moderate amount of free fluid in the pelvis. No circumscribed abscess collection seen. No free intraperitoneal air. Musculoskeletal: Degenerative changes of the slightly scoliotic  thoracolumbar spine, at least moderate in degree. Acute hematoma within the lower right rectus abdominus musculature, measuring approximately 4.5 cm thickness and approximately 8 cm craniocaudal extent. Ill-defined fluid/edema throughout the subcutaneous soft tissues indicating anasarca. IMPRESSION: 1. Acute spontaneous intramuscular hematoma within the lower right rectus abdominus musculature, just above the level of the bladder, measuring 4.5 cm thickness and approximately 8 cm craniocaudal extent. 2. Ascites within the upper abdomen and pelvis, small to moderate in size. 3. Small bilateral pleural effusions. 4. Anasarca. 5. Small pericardial effusion appears stable. 6. Cardiomegaly with diffuse coronary artery calcifications. 7. Aortic atherosclerosis. These results were called by telephone at the time of interpretation on 04/23/2017 at 12:05 pm to Dr. Alford HighlandICHARD WIETING , who verbally acknowledged these results. Electronically Signed   By: Bary RichardStan  Maynard M.D.   On: 04/23/2017 12:10  Medications:    . amiodarone  200 mg Oral BID  . carbidopa-levodopa  1 tablet Oral QID  . chlorhexidine  15 mL Mouth Rinse BID  . docusate sodium  100 mg Oral BID  . escitalopram  20 mg Oral Daily  . feeding supplement (NEPRO CARB STEADY)  237 mL Oral TID BM  . hydrALAZINE  10 mg Oral BID  . insulin aspart  0-9 Units Subcutaneous TID WC  . insulin aspart  2 Units Subcutaneous TID WC  . insulin glargine  5 Units Subcutaneous QHS  . isosorbide mononitrate  30 mg Oral Daily  . mouth rinse  15 mL Mouth Rinse q12n4p  . metoprolol tartrate  100 mg Oral BID  . multivitamin  15 mL Oral Daily  . saccharomyces boulardii  250 mg Oral Daily  . sodium chloride flush  3 mL Intravenous Q12H  . tuberculin  5 Units Intradermal Once   acetaminophen **OR** acetaminophen, bisacodyl, loratadine, LORazepam, nitroGLYCERIN, ondansetron **OR** ondansetron (ZOFRAN) IV, polyethylene glycol, traMADol  Assessment/ Plan:  76 y.o.  caucs[place]  female with coronary artery disease, congestive heart failure, pulmonary hypertension, parkinson's, diabetes mellitus type II, COPD, DVT on warfarin, atrial fibrillation, hyperlipidemia, anemia, anxiety, patellar fracture,  1. Acute renal failure with hyperkalemia on chronic kidney disease stage IV with proteinuria.  Baseline creatinine of 2.5, GFR of 17.  Chronic kidney disease secondary to hypertension and diabetes Acute renal failure secondary to overdiuresis and hypotension leading to ATN.  Dialysis again today PPD placed Outpatient d/c planning Next HD in chair  2. Diabetes mellitus type II with chronic kidney disease: insulin dependent.  Hemoglobin A1c 7.5% on 6/24  3. Hyperkalemia - Corrected with hemodialysis  4.  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

## 2017-04-24 NOTE — Progress Notes (Signed)
This note also relates to the following rows which could not be included: Pulse Rate - Cannot attach notes to unvalidated device data Resp - Cannot attach notes to unvalidated device data BP - Cannot attach notes to unvalidated device data  HD COMPLETED  

## 2017-04-24 NOTE — Progress Notes (Signed)
POST DIALYSIS ASSESSMENT 

## 2017-04-24 NOTE — Progress Notes (Signed)
Patient ID: Molly Wolf, female   DOB: 1941-04-12, 76 y.o.   MRN: [id number]    [facility] PROGRESS NOTE  Molly Wolf WUJ:[id number] DOB: 1941-04-12 DOA: 04/09/2017 PCP: Darliss RidgelBergert, Jessica W, MD  HPI/Subjective: Patient states her abdominal pain is a little bit less. Found to have abdominal hematoma yesterday. Patient feeling a little bit better but feels tired. States she slept okay.  Objective: Vitals:   04/24/17 1230 04/24/17 1333  BP:  116/81  Pulse: 91 95  Resp: 16 16  Temp:      Filed Weights   04/23/17 0530 04/23/17 2138 04/24/17 0419  Weight: 78.3 kg (172 lb 9.6 oz) 75.1 kg (165 lb 9.6 oz) 75.7 kg (166 lb 12.8 oz)    ROS: Review of Systems  Constitutional: Positive for malaise/fatigue. Negative for chills and fever.  Eyes: Negative for blurred vision.  Respiratory: Negative for cough and shortness of breath.   Cardiovascular: Negative for chest pain.  Gastrointestinal: Positive for abdominal pain. Negative for constipation, diarrhea, nausea and vomiting.  Genitourinary: Negative for dysuria.  Musculoskeletal: Negative for joint pain.  Neurological: Positive for weakness. Negative for headaches.   Exam: Physical Exam  HENT:  Nose: No mucosal edema.  Mouth/Throat: No oropharyngeal exudate or posterior oropharyngeal edema.  Eyes: Pupils are equal, round, and reactive to light. Conjunctivae, EOM and lids are normal.  Neck: No JVD present. Carotid bruit is not present. No edema present. No thyroid mass and no thyromegaly present.  Cardiovascular: S1 normal and S2 normal.  Exam reveals no gallop.   No murmur heard. Pulses:      Dorsalis pedis pulses are 2+ on the right side, and 2+ on the left side.  Respiratory: No respiratory distress. She has decreased breath sounds in the right lower field and the left lower field. She has no wheezes. She has no rhonchi. She has no rales.  GI: Soft. Bowel sounds are normal. There is tenderness in the suprapubic area.   Musculoskeletal:       Right ankle: She exhibits no swelling.       Left ankle: She exhibits no swelling.  Lymphadenopathy:    She has no cervical adenopathy.  Neurological: She is alert. No cranial nerve deficit.  Skin: Skin is warm. No rash noted. Nails show no clubbing.  Psychiatric: She has a normal mood and affect.      Data Reviewed: Basic Metabolic Panel:  Recent Labs Lab 04/19/17 0632 04/20/17 0517 04/21/17 0327 04/22/17 1449 04/23/17 0407 04/24/17 0733  NA 139 138 141 141 139 139  K 5.2* 5.5* 5.4* 4.4 4.7 4.3  CL 103 101 103 101 101 100*  CO2 28 28 29 29 28 30   GLUCOSE 200* 244* 134* 173* 234* 132*  BUN 77* 84* 90* 75* 63* 37*  CREATININE 3.90* 4.18* 4.51* 3.55* 3.10* 2.27*  CALCIUM 8.4* 8.4* 8.6* 8.2* 8.3* 8.2*  PHOS 4.0  --   --  3.7  --   --    CBC:  Recent Labs Lab 04/18/17 0934 04/19/17 0636 04/20/17 0517 04/21/17 0327 04/22/17 1449 04/24/17 0733  WBC 6.1 4.9  --  5.8 4.6  --   HGB 9.9* 9.4* 9.3* 9.9* 8.7* 8.7*  HCT 30.3* 28.8*  --  29.9* 27.0*  --   MCV 86.2 86.9  --  87.8 88.0  --   PLT 150 138*  --  152 162  --    BNP (last 3 results)  Recent Labs  01/26/17 1112 04/03/17 1918 04/05/17 0453  BNP 2,049.0* 1,139.0* 980.0*     CBG:  Recent Labs Lab 04/23/17 1159 04/23/17 1707 04/23/17 2057 04/24/17 0755 04/24/17 1333  GLUCAP 172* 227* 118* 134* 162*    Recent Results (from the past 240 hour(s))  C difficile quick scan w PCR reflex     Status: None   Collection Time: 04/17/17  2:50 PM  Result Value Ref Range Status   C Diff antigen NEGATIVE NEGATIVE Final   C Diff toxin NEGATIVE NEGATIVE Final   C Diff interpretation No C. difficile detected.  Final  Gastrointestinal Panel by PCR , Stool     Status: None   Collection Time: 04/17/17  2:50 PM  Result Value Ref Range Status   Campylobacter species NOT DETECTED NOT DETECTED Final   Plesimonas shigelloides NOT DETECTED NOT DETECTED Final   Salmonella species NOT DETECTED NOT  DETECTED Final   Yersinia enterocolitica NOT DETECTED NOT DETECTED Final   Vibrio species NOT DETECTED NOT DETECTED Final   Vibrio cholerae NOT DETECTED NOT DETECTED Final   Enteroaggregative E coli (EAEC) NOT DETECTED NOT DETECTED Final   Enteropathogenic E coli (EPEC) NOT DETECTED NOT DETECTED Final   Enterotoxigenic E coli (ETEC) NOT DETECTED NOT DETECTED Final   Shiga like toxin producing E coli (STEC) NOT DETECTED NOT DETECTED Final   Shigella/Enteroinvasive E coli (EIEC) NOT DETECTED NOT DETECTED Final   Cryptosporidium NOT DETECTED NOT DETECTED Final   Cyclospora cayetanensis NOT DETECTED NOT DETECTED Final   Entamoeba histolytica NOT DETECTED NOT DETECTED Final   Giardia lamblia NOT DETECTED NOT DETECTED Final   Adenovirus F40/41 NOT DETECTED NOT DETECTED Final   Astrovirus NOT DETECTED NOT DETECTED Final   Norovirus GI/GII NOT DETECTED NOT DETECTED Final   Rotavirus A NOT DETECTED NOT DETECTED Final   Sapovirus (I, II, IV, and V) NOT DETECTED NOT DETECTED Final     Scheduled Meds: . amiodarone  200 mg Oral BID  . carbidopa-levodopa  1 tablet Oral QID  . chlorhexidine  15 mL Mouth Rinse BID  . docusate sodium  100 mg Oral BID  . escitalopram  20 mg Oral Daily  . feeding supplement (NEPRO CARB STEADY)  237 mL Oral TID BM  . hydrALAZINE  10 mg Oral BID  . insulin aspart  0-9 Units Subcutaneous TID WC  . insulin aspart  2 Units Subcutaneous TID WC  . insulin glargine  5 Units Subcutaneous QHS  . isosorbide mononitrate  30 mg Oral Daily  . mouth rinse  15 mL Mouth Rinse q12n4p  . metoprolol tartrate  100 mg Oral BID  . multivitamin  15 mL Oral Daily  . saccharomyces boulardii  250 mg Oral Daily  . sodium chloride flush  3 mL Intravenous Q12H  . tuberculin  5 Units Intradermal Once    Assessment/Plan:  1. Suprapubic abdominal pain with Rectus abdominis hematoma measuring 4.5 x 8 cm seen on CT scan. Stop Coumadin. Give 1 dose of vitamin K today. Avoid medications that can  cause altered mental status 2. Acute kidney injury on chronic kidney disease stage IV.  Dialysis third treatment today. Uremia could be contributing to the patient's altered mental status. Outpatient dialysis planning. 3. Hyperkalemia secondary to acute kidney injury. Resolved with dialysis 4. Acute respiratory failure. Tapered off oxygen. 5. Acute encephalopathy. Continue to be careful with any medications that can cause altered mental status. Uremia could be contributing. Could be secondary to other medical issues also. 6. Pneumonia. Received 5 days of antibiotic and then  discontinued since pro calcitonin was negative. 7. History of Parkinson's on Sinemet 8. Type 2 diabetes mellitus. Continue Lantus 5 units subcutaneous injection daily and sliding scale with standing dose insulin prior to meals also. 9. Acute on chronic systolic congestive heart failure.  Continue metoprolol. No ACE inhibitor or arm secondary to acute kidney injury. 10. Atrial fibrillation with rapid ventricular rate on metoprolol and amiodarone at this point with better control of heart rate. Need to hold Coumadin with rectus abdominis bleed. Risk of stroke will be higher being off Coumadin. One dose of vitamin K given today. 11. Chronic anemia. Recent GI bleed. 12. Hyperlipidemia unspecified on atorvastatin 13. History of DVT on Coumadin. Need to hold Coumadin at this point with rectus abdominis bleed. Vascular surgery placed an IVC filter yesterday 14. Palliative care consultation appreciated   Code Status:     Code Status Orders        Start     Ordered   04/10/17 0328  Full code  Continuous     04/10/17 0327    Code Status History    Date Active Date Inactive Code Status Order ID Comments User Context   04/03/2017 11:30 PM 04/09/2017 10:09 PM Full Code [id number]  Hugelmeyer, Jon Gills, DO ED   02/15/2017  6:16 PM 02/16/2017  6:18 PM Full Code [id number]  Altamese Dilling, MD Inpatient   01/26/2017  2:44 PM 01/29/2017   9:47 PM Full Code [id number]  Alford Highland, MD ED   12/28/2016  7:44 AM 12/29/2016  4:34 PM DNR [id number]  Delfino Lovett, MD Inpatient   12/27/2016 11:06 PM 12/28/2016  7:44 AM Full Code [id number]  Hugelmeyer, Jon Gills, DO Inpatient    Advance Directive Documentation     Most Recent Value  Type of Advance Directive  Healthcare Power of Attorney  Pre-existing out of facility DNR order (yellow form or pink MOST form)  -  "MOST" Form in Place?  -     Family Communication: Spoke with the daughter Burgess Estelle Disposition Plan: TBD  Time spent: 24 minutes  Alford Highland  [employer]

## 2017-04-24 NOTE — Progress Notes (Signed)
Ppd test rt upper ext read today. No induration no redness noted.

## 2017-04-24 NOTE — Clinical Social Work Note (Signed)
CSW saw in chart review that PT has recommended STR. CSW will assess when able.  Molly PonderKaren Martha Javona Wolf, MSW, Theresia MajorsLCSWA [phone]

## 2017-04-24 NOTE — Progress Notes (Signed)
HD STARTED  

## 2017-04-25 LAB — GLUCOSE, CAPILLARY
Glucose-Capillary: 131 mg/dL — ABNORMAL HIGH (ref 65–99)
Glucose-Capillary: 140 mg/dL — ABNORMAL HIGH (ref 65–99)
Glucose-Capillary: 178 mg/dL — ABNORMAL HIGH (ref 65–99)
Glucose-Capillary: 168 mg/dL — ABNORMAL HIGH (ref 65–99)

## 2017-04-25 NOTE — Progress Notes (Signed)
Patient ID: Molly Wolf, female   DOB: 05-11-1941, 76 y.o.   MRN: [id number]   [facility] PROGRESS NOTE  Aundra Pung WUJ:[id number] DOB: Sep 17, 1941 DOA: 04/09/2017 PCP: Darliss Ridgel, MD  HPI/Subjective: Patient was seen sitting up eating breakfast this morning. She was feeling a little bit better. She was feeling fatigued and weak. She was having tremor with her Parkinson's. Her abdominal pain is less.  Objective: Vitals:   04/25/17 0415 04/25/17 0800  BP: (!) 141/80 (!) 139/92  Pulse: 93 75  Resp: 16 18  Temp: 98.2 F (36.8 C) 98.4 F (36.9 C)    Filed Weights   04/23/17 2138 04/24/17 0419 04/25/17 0415  Weight: 75.1 kg (165 lb 9.6 oz) 75.7 kg (166 lb 12.8 oz) 73.1 kg (161 lb 3.2 oz)    ROS: Review of Systems  Constitutional: Positive for malaise/fatigue. Negative for chills and fever.  Eyes: Negative for blurred vision.  Respiratory: Negative for cough and shortness of breath.   Cardiovascular: Negative for chest pain.  Gastrointestinal: Positive for abdominal pain. Negative for constipation, diarrhea, nausea and vomiting.  Genitourinary: Negative for dysuria.  Musculoskeletal: Negative for joint pain.  Neurological: Positive for weakness. Negative for headaches.   Exam: Physical Exam  HENT:  Nose: No mucosal edema.  Mouth/Throat: No oropharyngeal exudate or posterior oropharyngeal edema.  Eyes: Pupils are equal, round, and reactive to light. Conjunctivae, EOM and lids are normal.  Neck: No JVD present. Carotid bruit is not present. No edema present. No thyroid mass and no thyromegaly present.  Cardiovascular: S1 normal and S2 normal.  Exam reveals no gallop.   No murmur heard. Pulses:      Dorsalis pedis pulses are 2+ on the right side, and 2+ on the left side.  Respiratory: No respiratory distress. She has decreased breath sounds in the right lower field and the left lower field. She has no wheezes. She has no rhonchi. She has no rales.  GI: Soft. Bowel  sounds are normal. There is tenderness in the suprapubic area.  Musculoskeletal:       Right ankle: She exhibits no swelling.       Left ankle: She exhibits no swelling.  Lymphadenopathy:    She has no cervical adenopathy.  Neurological: She is alert. No cranial nerve deficit.  Skin: Skin is warm. No rash noted. Nails show no clubbing.  Psychiatric: She has a normal mood and affect.      Data Reviewed: Basic Metabolic Panel:  Recent Labs Lab 04/19/17 0632 04/20/17 0517 04/21/17 0327 04/22/17 1449 04/23/17 0407 04/24/17 0733  NA 139 138 141 141 139 139  K 5.2* 5.5* 5.4* 4.4 4.7 4.3  CL 103 101 103 101 101 100*  CO2 28 28 29 29 28 30   GLUCOSE 200* 244* 134* 173* 234* 132*  BUN 77* 84* 90* 75* 63* 37*  CREATININE 3.90* 4.18* 4.51* 3.55* 3.10* 2.27*  CALCIUM 8.4* 8.4* 8.6* 8.2* 8.3* 8.2*  PHOS 4.0  --   --  3.7  --   --    CBC:  Recent Labs Lab 04/19/17 0636 04/20/17 0517 04/21/17 0327 04/22/17 1449 04/24/17 0733  WBC 4.9  --  5.8 4.6  --   HGB 9.4* 9.3* 9.9* 8.7* 8.7*  HCT 28.8*  --  29.9* 27.0*  --   MCV 86.9  --  87.8 88.0  --   PLT 138*  --  152 162  --    BNP (last 3 results)  Recent Labs  01/26/17  1112 04/03/17 1918 04/05/17 0453  BNP 2,049.0* 1,139.0* 980.0*     CBG:  Recent Labs Lab 04/24/17 1333 04/24/17 1643 04/24/17 2038 04/25/17 0735 04/25/17 1156  GLUCAP 162* 142* 129* 131* 140*    Recent Results (from the past 240 hour(s))  C difficile quick scan w PCR reflex     Status: None   Collection Time: 04/17/17  2:50 PM  Result Value Ref Range Status   C Diff antigen NEGATIVE NEGATIVE Final   C Diff toxin NEGATIVE NEGATIVE Final   C Diff interpretation No C. difficile detected.  Final  Gastrointestinal Panel by PCR , Stool     Status: None   Collection Time: 04/17/17  2:50 PM  Result Value Ref Range Status   Campylobacter species NOT DETECTED NOT DETECTED Final   Plesimonas shigelloides NOT DETECTED NOT DETECTED Final   Salmonella  species NOT DETECTED NOT DETECTED Final   Yersinia enterocolitica NOT DETECTED NOT DETECTED Final   Vibrio species NOT DETECTED NOT DETECTED Final   Vibrio cholerae NOT DETECTED NOT DETECTED Final   Enteroaggregative E coli (EAEC) NOT DETECTED NOT DETECTED Final   Enteropathogenic E coli (EPEC) NOT DETECTED NOT DETECTED Final   Enterotoxigenic E coli (ETEC) NOT DETECTED NOT DETECTED Final   Shiga like toxin producing E coli (STEC) NOT DETECTED NOT DETECTED Final   Shigella/Enteroinvasive E coli (EIEC) NOT DETECTED NOT DETECTED Final   Cryptosporidium NOT DETECTED NOT DETECTED Final   Cyclospora cayetanensis NOT DETECTED NOT DETECTED Final   Entamoeba histolytica NOT DETECTED NOT DETECTED Final   Giardia lamblia NOT DETECTED NOT DETECTED Final   Adenovirus F40/41 NOT DETECTED NOT DETECTED Final   Astrovirus NOT DETECTED NOT DETECTED Final   Norovirus GI/GII NOT DETECTED NOT DETECTED Final   Rotavirus A NOT DETECTED NOT DETECTED Final   Sapovirus (I, II, IV, and V) NOT DETECTED NOT DETECTED Final     Scheduled Meds: . amiodarone  200 mg Oral BID  . carbidopa-levodopa  1 tablet Oral QID  . chlorhexidine  15 mL Mouth Rinse BID  . docusate sodium  100 mg Oral BID  . escitalopram  20 mg Oral Daily  . feeding supplement (NEPRO CARB STEADY)  237 mL Oral TID BM  . hydrALAZINE  10 mg Oral BID  . insulin aspart  0-9 Units Subcutaneous TID WC  . insulin aspart  2 Units Subcutaneous TID WC  . insulin glargine  5 Units Subcutaneous QHS  . isosorbide mononitrate  30 mg Oral Daily  . mouth rinse  15 mL Mouth Rinse q12n4p  . metoprolol tartrate  100 mg Oral BID  . multivitamin  15 mL Oral Daily  . saccharomyces boulardii  250 mg Oral Daily  . sodium chloride flush  3 mL Intravenous Q12H    Assessment/Plan:  1. Suprapubic abdominal pain with Rectus abdominis hematoma measuring 4.5 x 8 cm seen on CT scan. Stop Coumadin. Given vitamin K yesterday.. Avoid medications that can cause altered mental  status 2. Acute kidney injury on chronic kidney disease stage IV.  Dialysis third treatment today. Uremia could be contributing to the patient's altered mental status. Outpatient dialysis planning. 3. Hyperkalemia secondary to acute kidney injury. Resolved with dialysis. 4. Acute respiratory failure. Tapered off oxygen. 5. Acute encephalopathy. Continue to be careful with any medications that can cause altered mental status. Uremia could be contributing.  6. Pneumonia. Received 5 days of antibiotic and then discontinued since pro calcitonin was negative. 7. History of Parkinson's on Sinemet  8. Type 2 diabetes mellitus. Continue Lantus 5 units subcutaneous injection daily and sliding scale with standing dose insulin prior to meals also. 9. Acute on chronic systolic congestive heart failure.  Continue metoprolol. No ACE inhibitor or arm secondary to acute kidney injury. 10. Atrial fibrillation with rapid ventricular rate on metoprolol and amiodarone at this point with better control of heart rate. Need to hold Coumadin with rectus abdominis bleed. Risk of stroke will be higher being off Coumadin. One dose of vitamin K given today. 11. Chronic anemia. Recent GI bleed. 12. Hyperlipidemia unspecified on atorvastatin 13. History of DVT on Coumadin. Need to hold Coumadin at this point with rectus abdominis bleed. Vascular surgery placed an IVC filter.  14. Palliative care consultation appreciated   Code Status:     Code Status Orders        Start     Ordered   04/10/17 0328  Full code  Continuous     04/10/17 0327    Code Status History    Date Active Date Inactive Code Status Order ID Comments User Context   04/03/2017 11:30 PM 04/09/2017 10:09 PM Full Code [id number]  Hugelmeyer, Jon GillsAlexis, DO ED   02/15/2017  6:16 PM 02/16/2017  6:18 PM Full Code [id number]  Altamese DillingVachhani, Vaibhavkumar, MD Inpatient   01/26/2017  2:44 PM 01/29/2017  9:47 PM Full Code [id number]  Alford HighlandWieting, Havyn Ramo, MD ED   12/28/2016  7:44 AM  12/29/2016  4:34 PM DNR [id number]  Delfino LovettShah, Vipul, MD Inpatient   12/27/2016 11:06 PM 12/28/2016  7:44 AM Full Code [id number]  Hugelmeyer, Jon GillsAlexis, DO Inpatient    Advance Directive Documentation     Most Recent Value  Type of Advance Directive  Healthcare Power of Attorney  Pre-existing out of facility DNR order (yellow form or pink MOST form)  -  "MOST" Form in Place?  -     Family Communication: Spoke with the daughter on the phone today Disposition Plan: TBD  Time spent: 22 minutes  Alford HighlandWIETING, Bach Rocchi  [employer]

## 2017-04-25 NOTE — Progress Notes (Signed)
[facility], [place] 04/25/17  Subjective:   Clinically, patient appears to have improved overall. Her mental status is better. Appetite is also slightly better. Overall, she appears little stronger. No nausea or vomiting reported. Abdominal pain has improved. 1500 cc of fluid removed with dialysis on Saturday 3500 removed so far Urine output remains low normal   Objective:  Vital signs in last 24 hours:  Temp:  [98.2 F (36.8 C)-98.8 F (37.1 C)] 98.4 F (36.9 C) (07/15 0800) Pulse Rate:  [75-96] 75 (07/15 0800) Resp:  [14-18] 18 (07/15 0800) BP: (116-154)/(71-92) 139/92 (07/15 0800) SpO2:  [91 %-100 %] 92 % (07/15 0800) Weight:  [73.1 kg (161 lb 3.2 oz)] 73.1 kg (161 lb 3.2 oz) (07/15 0415)  Weight change: -1.996 kg (-4 lb 6.4 oz) Filed Weights   04/23/17 2138 04/24/17 0419 04/25/17 0415  Weight: 75.1 kg (165 lb 9.6 oz) 75.7 kg (166 lb 12.8 oz) 73.1 kg (161 lb 3.2 oz)    Intake/Output:    Intake/Output Summary (Last 24 hours) at 04/25/17 1148 Last data filed at 04/25/17 0900  Gross per 24 hour  Intake              240 ml  Output             2100 ml  Net            -1860 ml     Physical Exam: General: Laying in the bed, no acute distress  HEENT anicteric  Neck supple  Pulm/lungs Normal breathing, mild rhonchi at bases  CVS/Heart irregular  Abdomen:  Soft, mild diffuse tenderness  Extremities: Trace edema  Neurologic: Alert, able to answer questions, Tremors of head and arms due to parkinsons  Skin: Scattered ecchymosis          Basic Metabolic Panel:   Recent Labs Lab 04/19/17 0632 04/20/17 0517 04/21/17 0327 04/22/17 1449 04/23/17 0407 04/24/17 0733  NA 139 138 141 141 139 139  K 5.2* 5.5* 5.4* 4.4 4.7 4.3  CL 103 101 103 101 101 100*  CO2 28 28 29 29 28 30   GLUCOSE 200* 244* 134* 173* 234* 132*  BUN 77* 84* 90* 75* 63* 37*  CREATININE 3.90* 4.18* 4.51* 3.55* 3.10* 2.27*  CALCIUM 8.4* 8.4* 8.6* 8.2* 8.3* 8.2*   PHOS 4.0  --   --  3.7  --   --      CBC:  Recent Labs Lab 04/19/17 0636 04/20/17 0517 04/21/17 0327 04/22/17 1449 04/24/17 0733  WBC 4.9  --  5.8 4.6  --   HGB 9.4* 9.3* 9.9* 8.7* 8.7*  HCT 28.8*  --  29.9* 27.0*  --   MCV 86.9  --  87.8 88.0  --   PLT 138*  --  152 162  --       Lab Results  Component Value Date   HEPBSAG Negative 04/22/2017   HEPBSAB Non Reactive 04/22/2017      Microbiology:  Recent Results (from the past 240 hour(s))  C difficile quick scan w PCR reflex     Status: None   Collection Time: 04/17/17  2:50 PM  Result Value Ref Range Status   C Diff antigen NEGATIVE NEGATIVE Final   C Diff toxin NEGATIVE NEGATIVE Final   C Diff interpretation No C. difficile detected.  Final  Gastrointestinal Panel by PCR , Stool     Status: None   Collection Time: 04/17/17  2:50 PM  Result Value Ref Range  Status   Campylobacter species NOT DETECTED NOT DETECTED Final   Plesimonas shigelloides NOT DETECTED NOT DETECTED Final   Salmonella species NOT DETECTED NOT DETECTED Final   Yersinia enterocolitica NOT DETECTED NOT DETECTED Final   Vibrio species NOT DETECTED NOT DETECTED Final   Vibrio cholerae NOT DETECTED NOT DETECTED Final   Enteroaggregative E coli (EAEC) NOT DETECTED NOT DETECTED Final   Enteropathogenic E coli (EPEC) NOT DETECTED NOT DETECTED Final   Enterotoxigenic E coli (ETEC) NOT DETECTED NOT DETECTED Final   Shiga like toxin producing E coli (STEC) NOT DETECTED NOT DETECTED Final   Shigella/Enteroinvasive E coli (EIEC) NOT DETECTED NOT DETECTED Final   Cryptosporidium NOT DETECTED NOT DETECTED Final   Cyclospora cayetanensis NOT DETECTED NOT DETECTED Final   Entamoeba histolytica NOT DETECTED NOT DETECTED Final   Giardia lamblia NOT DETECTED NOT DETECTED Final   Adenovirus F40/41 NOT DETECTED NOT DETECTED Final   Astrovirus NOT DETECTED NOT DETECTED Final   Norovirus GI/GII NOT DETECTED NOT DETECTED Final   Rotavirus A NOT DETECTED NOT  DETECTED Final   Sapovirus (I, II, IV, and V) NOT DETECTED NOT DETECTED Final    Coagulation Studies:  Recent Labs  04/22/17 1449 04/23/17 0407 04/24/17 0733  LABPROT 29.0* 31.2* 29.3*  INR 2.67 2.93 2.71    Urinalysis: No results for input(s): COLORURINE, LABSPEC, PHURINE, GLUCOSEU, HGBUR, BILIRUBINUR, KETONESUR, PROTEINUR, UROBILINOGEN, NITRITE, LEUKOCYTESUR in the last 72 hours.  Invalid input(s): APPERANCEUR    Imaging: Ct Abdomen Pelvis Wo Contrast  Result Date: 04/23/2017 CLINICAL DATA:  Generalized diffuse abd pain, pneumonia, Chronic anemia. Recent GI bleed. Hyperlipidemia unspecified on atorvastatin EXAM: CT ABDOMEN AND PELVIS WITHOUT CONTRAST TECHNIQUE: Multidetector CT imaging of the abdomen and pelvis was performed following the standard protocol without IV contrast. COMPARISON:  CT abdomen dated 12/07/2016. FINDINGS: Lower chest: Small bilateral pleural effusions. Small pericardial effusion at the heart base, grossly stable compared to previous exam. Cardiomegaly, incompletely imaged. Coronary artery calcifications. Hepatobiliary: No focal liver abnormality is seen. Gallbladder appears hyperdense which may indicate stones or sludge. Pancreas: Atrophic.  No acute findings. Spleen: Normal in size without focal abnormality. Adrenals/Urinary Tract: Adrenal glands are unremarkable. Small left renal cyst. No renal stone or hydronephrosis. No ureteral or bladder calculi identified. Stomach/Bowel: No dilated large or small bowel loops. Vascular/Lymphatic: Aortic atherosclerosis. No enlarged lymph nodes seen. Reproductive: Unremarkable. Other: Small amount of free fluid in the upper abdomen. Moderate amount of free fluid in the pelvis. No circumscribed abscess collection seen. No free intraperitoneal air. Musculoskeletal: Degenerative changes of the slightly scoliotic thoracolumbar spine, at least moderate in degree. Acute hematoma within the lower right rectus abdominus musculature,  measuring approximately 4.5 cm thickness and approximately 8 cm craniocaudal extent. Ill-defined fluid/edema throughout the subcutaneous soft tissues indicating anasarca. IMPRESSION: 1. Acute spontaneous intramuscular hematoma within the lower right rectus abdominus musculature, just above the level of the bladder, measuring 4.5 cm thickness and approximately 8 cm craniocaudal extent. 2. Ascites within the upper abdomen and pelvis, small to moderate in size. 3. Small bilateral pleural effusions. 4. Anasarca. 5. Small pericardial effusion appears stable. 6. Cardiomegaly with diffuse coronary artery calcifications. 7. Aortic atherosclerosis. These results were called by telephone at the time of interpretation on 04/23/2017 at 12:05 pm to Dr. Alford HighlandICHARD WIETING , who verbally acknowledged these results. Electronically Signed   By: Bary RichardStan  Maynard M.D.   On: 04/23/2017 12:10     Medications:    . amiodarone  200 mg Oral BID  . carbidopa-levodopa  1 tablet Oral QID  . chlorhexidine  15 mL Mouth Rinse BID  . docusate sodium  100 mg Oral BID  . escitalopram  20 mg Oral Daily  . feeding supplement (NEPRO CARB STEADY)  237 mL Oral TID BM  . hydrALAZINE  10 mg Oral BID  . insulin aspart  0-9 Units Subcutaneous TID WC  . insulin aspart  2 Units Subcutaneous TID WC  . insulin glargine  5 Units Subcutaneous QHS  . isosorbide mononitrate  30 mg Oral Daily  . mouth rinse  15 mL Mouth Rinse q12n4p  . metoprolol tartrate  100 mg Oral BID  . multivitamin  15 mL Oral Daily  . saccharomyces boulardii  250 mg Oral Daily  . sodium chloride flush  3 mL Intravenous Q12H   acetaminophen **OR** acetaminophen, bisacodyl, loratadine, LORazepam, nitroGLYCERIN, ondansetron **OR** ondansetron (ZOFRAN) IV, polyethylene glycol, traMADol  Assessment/ Plan:  76 y.o. caucsian  female with coronary artery disease, congestive heart failure, pulmonary hypertension, parkinson's, diabetes mellitus type II, COPD, DVT on warfarin, atrial  fibrillation, hyperlipidemia, anemia, anxiety, patellar fracture,Aortic atherosclerosis  1. 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  Chronic kidney disease secondary to hypertension and diabetes Acute renal failure secondary to overdiuresis and hypotension leading to ATN.  Patient has shown good response to dialysis treatment. Overall clinically improved as far as mental status and appetite goes. She may benefit from continued dialysis. She may have progressed to ESRD. PPD negative Next HD in chair Outpatient d/c planning for ESRD. She may need twice a week dialysis initially and see how she does.   2. Diabetes mellitus type II with chronic kidney disease: insulin dependent.  Hemoglobin A1c 7.5% on 6/24  3. Hyperkalemia - Corrected with hemodialysis  4.  Abdominal pain CT shows hematoma Vascular surgery evaluation IVC filter placed since patient has h/o recurrent DVT and PE Avoid heparin during outpatient dialysis until hematoma resolves.   LOS: 15 [facility] 7/15/201811:48 AM  [address] [place], [place] [phone]

## 2017-04-26 ENCOUNTER — Encounter: Payer: Self-pay | Admitting: Vascular Surgery

## 2017-04-26 ENCOUNTER — Ambulatory Visit: Payer: Medicare Other | Admitting: Family

## 2017-04-26 DIAGNOSIS — R410 Disorientation, unspecified: Secondary | ICD-10-CM

## 2017-04-26 LAB — BASIC METABOLIC PANEL
Anion gap: 9 (ref 5–15)
BUN: 36 mg/dL — ABNORMAL HIGH (ref 6–20)
CALCIUM: 8.4 mg/dL — AB (ref 8.9–10.3)
CO2: 30 mmol/L (ref 22–32)
CREATININE: 2.42 mg/dL — AB (ref 0.44–1.00)
Chloride: 100 mmol/L — ABNORMAL LOW (ref 101–111)
GFR, EST AFRICAN AMERICAN: 21 mL/min — AB (ref >60)
GFR, EST NON AFRICAN AMERICAN: 18 mL/min — AB (ref >60)
Glucose, Bld: 206 mg/dL — ABNORMAL HIGH (ref 65–99)
Potassium: 4.3 mmol/L (ref 3.5–5.1)
SODIUM: 139 mmol/L (ref 135–145)

## 2017-04-26 LAB — CBC
HCT: 25.2 % — ABNORMAL LOW (ref 35.0–47.0)
Hemoglobin: 8.3 g/dL — ABNORMAL LOW (ref 12.0–16.0)
MCH: 28.4 pg (ref 26.0–34.0)
MCHC: 32.8 g/dL (ref 32.0–36.0)
MCV: 86.6 fL (ref 80.0–100.0)
PLATELETS: 156 10*3/uL (ref 150–440)
RBC: 2.91 MIL/uL — ABNORMAL LOW (ref 3.80–5.20)
RDW: 17.3 % — AB (ref 11.5–14.5)
WBC: 6.2 10*3/uL (ref 3.6–11.0)

## 2017-04-26 LAB — GLUCOSE, CAPILLARY
Glucose-Capillary: 200 mg/dL — ABNORMAL HIGH (ref 65–99)
Glucose-Capillary: 178 mg/dL — ABNORMAL HIGH (ref 65–99)
Glucose-Capillary: 136 mg/dL — ABNORMAL HIGH (ref 65–99)

## 2017-04-26 LAB — PROTIME-INR
INR: 2.71
PROTHROMBIN TIME: 29.3 s — AB (ref 11.4–15.2)

## 2017-04-26 MED ORDER — CARBIDOPA-LEVODOPA ER 50-200 MG PO TBCR
1.0000 | EXTENDED_RELEASE_TABLET | Freq: Four times a day (QID) | ORAL | Status: DC
  Administered 2017-04-26 – 2017-04-27 (×7): 1 via ORAL
  Filled 2017-04-26 (×8): qty 1

## 2017-04-26 NOTE — Care Management (Signed)
Notified Dimas ChyleAmanda Morris with [employer] of negative ppd results, of need for dialysis post discharge; [facility]  Patient is sitting for dialysis.

## 2017-04-26 NOTE — Progress Notes (Signed)
   04/25/17 0133  PPD Results  Does patient have an induration at the injection site? No  Induration(mm) 0 mm  Name of Physician Notified no indication to notify MD

## 2017-04-26 NOTE — Care Management Important Message (Signed)
Important Message  Patient Details  Name: Renella CunasJudy Reinig MRN: [id number] Date of Birth: 1941/07/14   Medicare Important Message Given:  N/A - LOS <3 / Initial given by admissions    Eber HongGreene, Shadee Montoya R, RN 04/26/2017, 11:17 AM

## 2017-04-26 NOTE — Progress Notes (Signed)
HD treatment complete 

## 2017-04-26 NOTE — Progress Notes (Signed)
PT Cancellation Note  Patient Details Name: Molly Wolf MRN: [name] DOB: 1941-08-12   Cancelled Treatment:    Reason Eval/Treat Not Completed: Patient at procedure or test/unavailable. Chart reviewed. Per nursing pt is currently off floor for dialysis. Will re-attempt PT at later date/time.   Gwenlyn Foundiley Caroline Longie, SPT 04/26/17,11:49 AM [phone]

## 2017-04-26 NOTE — Progress Notes (Signed)
[facility], [place] 04/26/17  Subjective:  Patient seen and evaluated during hemodialysis today. Urine output remains low at this time. Seems to be tolerating dialysis quite well.  Objective:  Vital signs in last 24 hours:  Temp:  [98 F (36.7 C)-98.8 F (37.1 C)] 98 F (36.7 C) (07/16 1000) Pulse Rate:  [76-92] 92 (07/16 1100) Resp:  [13-22] 16 (07/16 1100) BP: (120-155)/(73-109) 150/109 (07/16 1100) SpO2:  [91 %-100 %] 93 % (07/16 1100) Weight:  [71.7 kg (158 lb)] 71.7 kg (158 lb) (07/16 0511)  Weight change: -1.452 kg (-3 lb 3.2 oz) Filed Weights   04/24/17 0419 04/25/17 0415 04/26/17 0511  Weight: 75.7 kg (166 lb 12.8 oz) 73.1 kg (161 lb 3.2 oz) 71.7 kg (158 lb)    Intake/Output:    Intake/Output Summary (Last 24 hours) at 04/26/17 1202 Last data filed at 04/26/17 1610  Gross per 24 hour  Intake              120 ml  Output              300 ml  Net             -180 ml     Physical Exam: General: Laying in the bed, no acute distress  HEENT anicteric  Neck supple  Pulm/lungs Normal breathing, mild rhonchi at bases  CVS/Heart irregular  Abdomen:  Soft, mild diffuse tenderness  Extremities: Trace edema  Neurologic: Alert, able to answer questions, Tremors of head and arms due to parkinsons  Skin: Scattered ecchymosis          Basic Metabolic Panel:   Recent Labs Lab 04/21/17 0327 04/22/17 1449 04/23/17 0407 04/24/17 0733 04/26/17 0438  NA 141 141 139 139 139  K 5.4* 4.4 4.7 4.3 4.3  CL 103 101 101 100* 100*  CO2 29 29 28 30 30   GLUCOSE 134* 173* 234* 132* 206*  BUN 90* 75* 63* 37* 36*  CREATININE 4.51* 3.55* 3.10* 2.27* 2.42*  CALCIUM 8.6* 8.2* 8.3* 8.2* 8.4*  PHOS  --  3.7  --   --   --      CBC:  Recent Labs Lab 04/20/17 0517 04/21/17 0327 04/22/17 1449 04/24/17 0733 04/26/17 0438  WBC  --  5.8 4.6  --  6.2  HGB 9.3* 9.9* 8.7* 8.7* 8.3*  HCT  --  29.9* 27.0*  --  25.2*  MCV  --  87.8 88.0  --  86.6   PLT  --  152 162  --  156      Lab Results  Component Value Date   HEPBSAG Negative 04/22/2017   HEPBSAB Non Reactive 04/22/2017      Microbiology:  Recent Results (from the past 240 hour(s))  C difficile quick scan w PCR reflex     Status: None   Collection Time: 04/17/17  2:50 PM  Result Value Ref Range Status   C Diff antigen NEGATIVE NEGATIVE Final   C Diff toxin NEGATIVE NEGATIVE Final   C Diff interpretation No C. difficile detected.  Final  Gastrointestinal Panel by PCR , Stool     Status: None   Collection Time: 04/17/17  2:50 PM  Result Value Ref Range Status   Campylobacter species NOT DETECTED NOT DETECTED Final   Plesimonas shigelloides NOT DETECTED NOT DETECTED Final   Salmonella species NOT DETECTED NOT DETECTED Final   Yersinia enterocolitica NOT DETECTED NOT DETECTED Final   Vibrio species NOT DETECTED NOT DETECTED  Final   Vibrio cholerae NOT DETECTED NOT DETECTED Final   Enteroaggregative E coli (EAEC) NOT DETECTED NOT DETECTED Final   Enteropathogenic E coli (EPEC) NOT DETECTED NOT DETECTED Final   Enterotoxigenic E coli (ETEC) NOT DETECTED NOT DETECTED Final   Shiga like toxin producing E coli (STEC) NOT DETECTED NOT DETECTED Final   Shigella/Enteroinvasive E coli (EIEC) NOT DETECTED NOT DETECTED Final   Cryptosporidium NOT DETECTED NOT DETECTED Final   Cyclospora cayetanensis NOT DETECTED NOT DETECTED Final   Entamoeba histolytica NOT DETECTED NOT DETECTED Final   Giardia lamblia NOT DETECTED NOT DETECTED Final   Adenovirus F40/41 NOT DETECTED NOT DETECTED Final   Astrovirus NOT DETECTED NOT DETECTED Final   Norovirus GI/GII NOT DETECTED NOT DETECTED Final   Rotavirus A NOT DETECTED NOT DETECTED Final   Sapovirus (I, II, IV, and V) NOT DETECTED NOT DETECTED Final    Coagulation Studies:  Recent Labs  04/24/17 0733 04/26/17 0438  LABPROT 29.3* 29.3*  INR 2.71 2.71    Urinalysis: No results for input(s): COLORURINE, LABSPEC, PHURINE,  GLUCOSEU, HGBUR, BILIRUBINUR, KETONESUR, PROTEINUR, UROBILINOGEN, NITRITE, LEUKOCYTESUR in the last 72 hours.  Invalid input(s): APPERANCEUR    Imaging: No results found.   Medications:    . amiodarone  200 mg Oral BID  . carbidopa-levodopa  1 tablet Oral QID  . chlorhexidine  15 mL Mouth Rinse BID  . docusate sodium  100 mg Oral BID  . escitalopram  20 mg Oral Daily  . feeding supplement (NEPRO CARB STEADY)  237 mL Oral TID BM  . hydrALAZINE  10 mg Oral BID  . insulin aspart  0-9 Units Subcutaneous TID WC  . insulin aspart  2 Units Subcutaneous TID WC  . insulin glargine  5 Units Subcutaneous QHS  . isosorbide mononitrate  30 mg Oral Daily  . mouth rinse  15 mL Mouth Rinse q12n4p  . metoprolol tartrate  100 mg Oral BID  . multivitamin  15 mL Oral Daily  . saccharomyces boulardii  250 mg Oral Daily  . sodium chloride flush  3 mL Intravenous Q12H   acetaminophen **OR** acetaminophen, bisacodyl, loratadine, LORazepam, nitroGLYCERIN, ondansetron **OR** ondansetron (ZOFRAN) IV, polyethylene glycol, traMADol  Assessment/ Plan:  76 y.o. caucsian  female with coronary artery disease, congestive heart failure, pulmonary hypertension, parkinson's, diabetes mellitus type II, COPD, DVT on warfarin, atrial fibrillation, hyperlipidemia, anemia, anxiety, patellar fracture,Aortic atherosclerosis  1. 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  Chronic kidney disease secondary to hypertension and diabetes Acute renal failure secondary to overdiuresis and hypotension leading to ATN.  Patient has shown good response to dialysis treatment. Overall clinically improved as far as mental status and appetite goes. -  Patient is high risk for progression to end-stage renal disease given her underlying comorbidities. She is seen and evaluated during dialysis today. We will plan for outpatient  dialysis.   2. Diabetes mellitus type II with chronic kidney disease: insulin dependent.  Hemoglobin A1c 7.5% on 6/24  3. Hyperkalemia - Potassium corrected with dialysis. Potassium currently 4.3.  4.  Abdominal pain CT shows hematoma Vascular surgery evaluation IVC filter placed since patient has h/o recurrent DVT and PE Avoid heparin during outpatient dialysis until hematoma resolves.  5. Anemia of chronic kidney disease. Hemoglobin currently 8.3. Consider Epogen as an outpatient.   LOS: 16 Rossi Silvestro 7/16/201812:02 PM  [facility] [place], [place] [phone]

## 2017-04-26 NOTE — Progress Notes (Signed)
Patient ID: Molly CunasJudy Wolf, female   DOB: September 18, 1941, 76 y.o.   MRN: [id number]   [employer] PROGRESS NOTE  Molly CunasJudy Mccullers WUJ:[id number] DOB: September 18, 1941 DOA: 04/09/2017 PCP: Darliss RidgelBergert, Jessica W, MD  HPI/Subjective:  Patient denies any chest pain or shortness of breath  Objective: Vitals:   04/26/17 1230 04/26/17 1300  BP: (!) 141/86 (!) 157/95  Pulse: 91 90  Resp: 16 16  Temp:      Filed Weights   04/24/17 0419 04/25/17 0415 04/26/17 0511  Weight: 166 lb 12.8 oz (75.7 kg) 161 lb 3.2 oz (73.1 kg) 158 lb (71.7 kg)    ROS: Review of Systems  Constitutional: Positive for malaise/fatigue. Negative for chills and fever.  Eyes: Negative for blurred vision.  Respiratory: Negative for cough and shortness of breath.   Cardiovascular: Negative for chest pain.  Gastrointestinal: Positive for abdominal pain. Negative for constipation, diarrhea, nausea and vomiting.  Genitourinary: Negative for dysuria.  Musculoskeletal: Negative for joint pain.  Neurological: Positive for weakness. Negative for headaches.   Exam: Physical Exam  HENT:  Nose: No mucosal edema.  Mouth/Throat: No oropharyngeal exudate or posterior oropharyngeal edema.  Eyes: Pupils are equal, round, and reactive to light. Conjunctivae, EOM and lids are normal.  Neck: No JVD present. Carotid bruit is not present. No edema present. No thyroid mass and no thyromegaly present.  Cardiovascular: S1 normal and S2 normal.  Exam reveals no gallop.   No murmur heard. Pulses:      Dorsalis pedis pulses are 2+ on the right side, and 2+ on the left side.  Respiratory: No respiratory distress. She has decreased breath sounds in the right lower field and the left lower field. She has no wheezes. She has no rhonchi. She has no rales.  GI: Soft. Bowel sounds are normal. There is tenderness in the suprapubic area.  Musculoskeletal:       Right ankle: She exhibits no swelling.       Left ankle: She exhibits no swelling.   Lymphadenopathy:    She has no cervical adenopathy.  Neurological: She is alert. No cranial nerve deficit.  Skin: Skin is warm. No rash noted. Nails show no clubbing.  Psychiatric: She has a normal mood and affect.      Data Reviewed: Basic Metabolic Panel:  Recent Labs Lab 04/21/17 0327 04/22/17 1449 04/23/17 0407 04/24/17 0733 04/26/17 0438  NA 141 141 139 139 139  K 5.4* 4.4 4.7 4.3 4.3  CL 103 101 101 100* 100*  CO2 29 29 28 30 30   GLUCOSE 134* 173* 234* 132* 206*  BUN 90* 75* 63* 37* 36*  CREATININE 4.51* 3.55* 3.10* 2.27* 2.42*  CALCIUM 8.6* 8.2* 8.3* 8.2* 8.4*  PHOS  --  3.7  --   --   --    CBC:  Recent Labs Lab 04/20/17 0517 04/21/17 0327 04/22/17 1449 04/24/17 0733 04/26/17 0438  WBC  --  5.8 4.6  --  6.2  HGB 9.3* 9.9* 8.7* 8.7* 8.3*  HCT  --  29.9* 27.0*  --  25.2*  MCV  --  87.8 88.0  --  86.6  PLT  --  152 162  --  156   BNP (last 3 results)  Recent Labs  01/26/17 1112 04/03/17 1918 04/05/17 0453  BNP 2,049.0* 1,139.0* 980.0*     CBG:  Recent Labs Lab 04/25/17 0735 04/25/17 1156 04/25/17 1638 04/25/17 2102 04/26/17 0751  GLUCAP 131* 140* 178* 168* 200*    Recent Results (from the  past 240 hour(s))  C difficile quick scan w PCR reflex     Status: None   Collection Time: 04/17/17  2:50 PM  Result Value Ref Range Status   C Diff antigen NEGATIVE NEGATIVE Final   C Diff toxin NEGATIVE NEGATIVE Final   C Diff interpretation No C. difficile detected.  Final  Gastrointestinal Panel by PCR , Stool     Status: None   Collection Time: 04/17/17  2:50 PM  Result Value Ref Range Status   Campylobacter species NOT DETECTED NOT DETECTED Final   Plesimonas shigelloides NOT DETECTED NOT DETECTED Final   Salmonella species NOT DETECTED NOT DETECTED Final   Yersinia enterocolitica NOT DETECTED NOT DETECTED Final   Vibrio species NOT DETECTED NOT DETECTED Final   Vibrio cholerae NOT DETECTED NOT DETECTED Final   Enteroaggregative E coli  (EAEC) NOT DETECTED NOT DETECTED Final   Enteropathogenic E coli (EPEC) NOT DETECTED NOT DETECTED Final   Enterotoxigenic E coli (ETEC) NOT DETECTED NOT DETECTED Final   Shiga like toxin producing E coli (STEC) NOT DETECTED NOT DETECTED Final   Shigella/Enteroinvasive E coli (EIEC) NOT DETECTED NOT DETECTED Final   Cryptosporidium NOT DETECTED NOT DETECTED Final   Cyclospora cayetanensis NOT DETECTED NOT DETECTED Final   Entamoeba histolytica NOT DETECTED NOT DETECTED Final   Giardia lamblia NOT DETECTED NOT DETECTED Final   Adenovirus F40/41 NOT DETECTED NOT DETECTED Final   Astrovirus NOT DETECTED NOT DETECTED Final   Norovirus GI/GII NOT DETECTED NOT DETECTED Final   Rotavirus A NOT DETECTED NOT DETECTED Final   Sapovirus (I, II, IV, and V) NOT DETECTED NOT DETECTED Final     Scheduled Meds: . amiodarone  200 mg Oral BID  . carbidopa-levodopa  1 tablet Oral QID  . chlorhexidine  15 mL Mouth Rinse BID  . docusate sodium  100 mg Oral BID  . escitalopram  20 mg Oral Daily  . feeding supplement (NEPRO CARB STEADY)  237 mL Oral TID BM  . hydrALAZINE  10 mg Oral BID  . insulin aspart  0-9 Units Subcutaneous TID WC  . insulin aspart  2 Units Subcutaneous TID WC  . insulin glargine  5 Units Subcutaneous QHS  . isosorbide mononitrate  30 mg Oral Daily  . mouth rinse  15 mL Mouth Rinse q12n4p  . metoprolol tartrate  100 mg Oral BID  . multivitamin  15 mL Oral Daily  . saccharomyces boulardii  250 mg Oral Daily  . sodium chloride flush  3 mL Intravenous Q12H    Assessment/Plan:  1. Suprapubic abdominal pain with Rectus abdominis hematoma measuring 4.5 x 8 cm seen on CT scan. Stop Coumadin. Given vitamin K yesterday.. Avoid medications that can cause altered mental status 2. Acute kidney injury on chronic kidney disease stage IV.  Dialysis third treatment today.  3. Hyperkalemia secondary to acute kidney injury. Resolved with dialysis. 4. Acute respiratory failure. Tapered off  oxygen. 5. Acute encephalopathy. Continue to be careful with any medications that can cause altered mental status. Uremia could be contributing.  6. Pneumonia. Received 5 days of antibiotic and then discontinued since pro calcitonin was negative. 7. History of Parkinson's on Sinemet 8. Type 2 diabetes mellitus. Continue Lantus 5 units subcutaneous injection daily and sliding scale with standing dose insulin prior to meals also. 9. Acute on chronic systolic congestive heart failure.  Continue metoprolol. No ACE inhibitor or arm secondary to acute kidney injury. 10. Atrial fibrillation with rapid ventricular rate on metoprolol and amiodarone  at this point with better control of heart rate. Need to hold Coumadin with rectus abdominis bleed. Risk of stroke will be higher being off Coumadin.  11. Chronic anemia. Recent GI bleed. 12. Hyperlipidemia unspecified on atorvastatin 13. History of DVT on Coumadin. Need to hold Coumadin at this point with rectus abdominis bleed. Vascular surgery placed an IVC filter.  14. Palliative care consultation appreciated   Code Status:     Code Status Orders        Start     Ordered   04/10/17 0328  Full code  Continuous     04/10/17 0327    Code Status History    Date Active Date Inactive Code Status Order ID Comments User Context   04/03/2017 11:30 PM 04/09/2017 10:09 PM Full Code [id number]  Hugelmeyer, Jon Gills, DO ED   02/15/2017  6:16 PM 02/16/2017  6:18 PM Full Code [id number]  Altamese Dilling, MD Inpatient   01/26/2017  2:44 PM 01/29/2017  9:47 PM Full Code [id number]  Alford Highland, MD ED   12/28/2016  7:44 AM 12/29/2016  4:34 PM DNR [id number]  Delfino Lovett, MD Inpatient   12/27/2016 11:06 PM 12/28/2016  7:44 AM Full Code [id number]  Hugelmeyer, Jon Gills, DO Inpatient    Advance Directive Documentation     Most Recent Value  Type of Advance Directive  Healthcare Power of Attorney  Pre-existing out of facility DNR order (yellow form or pink MOST form)  -   "MOST" Form in Place?  -     Family Communication: Spoke with the daughter on the phone today Disposition Plan: TBD  Time spent: 22 minutes  Gerldine Suleiman, [facility]  [facility]

## 2017-04-26 NOTE — Plan of Care (Signed)
Problem: Pain Managment: Goal: General experience of comfort will improve Outcome: Progressing Reports less pain at hematoma site.

## 2017-04-26 NOTE — Care Management Important Message (Signed)
Important Message  Patient Details  Name: Renella CunasJudy Rackley MRN: [id number] Date of Birth: [birth year]   Medicare Important Message Given:  Yes Signed IM notice given   Eber HongGreene, Malikai Gut R, RN 04/26/2017, 11:25 AM

## 2017-04-26 NOTE — Discharge Instructions (Signed)
Heart Failure Clinic appointment on May 05, 2017 at 11:20am with Clarisa Kindredina Atavia Poppe, FNP. Please call [phone] to reschedule.

## 2017-04-26 NOTE — Progress Notes (Signed)
Pt taken for dialysis in recliner as requested.

## 2017-04-27 ENCOUNTER — Telehealth: Payer: Self-pay | Admitting: *Deleted

## 2017-04-27 LAB — CBC
HEMATOCRIT: 26.6 % — AB (ref 35.0–47.0)
HEMOGLOBIN: 8.5 g/dL — AB (ref 12.0–16.0)
MCH: 28.2 pg (ref 26.0–34.0)
MCHC: 32.1 g/dL (ref 32.0–36.0)
MCV: 87.9 fL (ref 80.0–100.0)
Platelets: 169 10*3/uL (ref 150–440)
RBC: 3.02 MIL/uL — AB (ref 3.80–5.20)
RDW: 16.9 % — ABNORMAL HIGH (ref 11.5–14.5)
WBC: 5.5 10*3/uL (ref 3.6–11.0)

## 2017-04-27 LAB — PROTIME-INR
INR: 2.33
PROTHROMBIN TIME: 26 s — AB (ref 11.4–15.2)

## 2017-04-27 LAB — GLUCOSE, CAPILLARY
Glucose-Capillary: 197 mg/dL — ABNORMAL HIGH (ref 65–99)
Glucose-Capillary: 112 mg/dL — ABNORMAL HIGH (ref 65–99)
Glucose-Capillary: 102 mg/dL — ABNORMAL HIGH (ref 65–99)

## 2017-04-27 MED ORDER — AMIODARONE HCL 200 MG PO TABS: 200.0000 mg | ORAL_TABLET | Freq: Two times a day (BID) | ORAL | Status: DC

## 2017-04-27 MED ORDER — INSULIN ASPART 100 UNIT/ML ~~LOC~~ SOLN: 2.0000 [IU] | Freq: Three times a day (TID) | SUBCUTANEOUS | 11 refills | Status: DC

## 2017-04-27 MED ORDER — METOPROLOL TARTRATE 100 MG PO TABS: 100.0000 mg | ORAL_TABLET | Freq: Two times a day (BID) | ORAL | Status: DC

## 2017-04-27 MED ORDER — NEPRO/CARBSTEADY PO LIQD
237.0000 mL | Freq: Three times a day (TID) | ORAL | 0 refills | Status: AC
Start: 2017-04-27 — End: ?

## 2017-04-27 MED ORDER — HYDRALAZINE HCL 10 MG PO TABS: 10.0000 mg | ORAL_TABLET | Freq: Two times a day (BID) | ORAL | Status: DC

## 2017-04-27 MED ORDER — LORAZEPAM 1 MG PO TABS: 1.0000 mg | ORAL_TABLET | Freq: Two times a day (BID) | ORAL | 0 refills | Status: DC | PRN

## 2017-04-27 MED ORDER — DOCUSATE SODIUM 100 MG PO CAPS: 100.0000 mg | ORAL_CAPSULE | Freq: Two times a day (BID) | ORAL | 0 refills | Status: AC

## 2017-04-27 MED ORDER — INSULIN GLARGINE 100 UNIT/ML ~~LOC~~ SOLN: 5.0000 [IU] | Freq: Every day | SUBCUTANEOUS | 11 refills | Status: DC

## 2017-04-27 MED ORDER — TRAMADOL HCL 50 MG PO TABS: 50.0000 mg | ORAL_TABLET | Freq: Every day | ORAL | 0 refills | Status: DC | PRN

## 2017-04-27 NOTE — Clinical Social Work Note (Addendum)
CSW spoke to [facility] they can accept patient once dialysis time has been confirmed.  CSW contacted dialysis liason Marchelle Folksmanda and she is expecting to hear on time today.  CSW to continue to follow patient's progress throughout discharge planning.  [facility], CSW made referral to [facility] nurse liaison Dayna BarkerKaren Robertson.   1:45pm  CSW received phone call from [place] and confirmed that patient's chair time will be 12:20 [facility]  CSW updated [facility] who can accept patient today once discharge summary and orders have been received.  Patient to be d/c'ed today to [facility] [place] room 802.  Patient and family agreeable to plans will transport via ems RN to call report to Konrad DoloresKim Hicks [phone].    Molly KnackEric R. Joice Wolf, MSW, Theresia MajorsLCSWA [phone]  04/27/2017 1:11 PM

## 2017-04-27 NOTE — Progress Notes (Signed)
Patient discharged to [facility] as ordered,report called to receiving nurse,vital signs within normal limits,transported by EMS

## 2017-04-27 NOTE — Progress Notes (Signed)
New referral for Palliative to follow at [facility] received from CSW C.H. Robinson WorldwideEric Anterhaus. Plan is for discharge today. Patient information faxed to referral. Thank you. Dayna BarkerKaren Robertson RN, BSN, [facility], hospital Liaison [phone] c

## 2017-04-27 NOTE — Evaluation (Signed)
Physical Therapy Re-Evaluation Patient Details Name: Molly Wolf MRN: [id number] DOB: [birth year] Today's Date: 04/27/2017   History of Present Illness  Pt is a 76 y.o. female presenting to hospital after recent discharge from hospital (discharge same day) with unresponsiveness; notes report CPR performed.  Pt admitted with acute respiratory failure with hypoxia and testing showing HCA PNA.  During hospitalization pt with acute encephalopathy, worsening renal function and R IJ permcath placed 04/22/17 for HD, and pt c/o lower abdominal pain. CT of the abdomen/pelvis shows rectus hematoma in the lower abdominal area (04/23/2017). Anticoagulation meds discontinued and IVC filter placed (04/23/2017). Pt with recent admission for acute exacerbation of chronic combined systolic and diastolic heart failure, a-fib with RVR, AKI, and anemia.  PMH includes CHF, CKD, CAD, COPD, DM, DVT, back and neck surgery, and Parkinson's disease.    Clinical Impression  Re-evaluation performed due to extended hospital stay. Pt supervision for bed mobility and CGA with RW for sit-to-stand and ambulation (12 ft). Pt requires vc's for proper B foot placement for sit-to-stands. Pt demonstrating generalized weakness and fatigue with minimal functional mobility during therapy session (anticipate due to extended hospital stay). Recommended discharge to SNF. Plan of care/goals reviewed and remains appropriate.     Follow Up Recommendations SNF    Equipment Recommendations  Rolling walker with 5" wheels    Recommendations for Other Services       Precautions / Restrictions Precautions Precautions: Fall Precaution Comments: R IJ permcath Restrictions Weight Bearing Restrictions: No      Mobility  Bed Mobility Overal bed mobility: Needs Assistance Bed Mobility: Supine to Sit     Supine to sit: Supervision;HOB elevated     General bed mobility comments: increased effort/time to perform supine-to-sit; use of bed rail  and HOB elevated  Transfers Overall transfer level: Needs assistance Equipment used: Rolling walker (2 wheeled) Transfers: Sit to/from Stand Sit to Stand: Min guard         General transfer comment: CGA with RW for sit-to-stand with vc's for foot placement  Ambulation/Gait Ambulation/Gait assistance: Min guard Ambulation Distance (Feet): 12 Feet Assistive device: Rolling walker (2 wheeled)   Gait velocity: decreased   General Gait Details: pt able to ambulate from R side of bed to bedside chair on L side of bed with CGA and RW; pt presents with decreased B step length and decreased B LE stance time  Stairs            Wheelchair Mobility    Modified Rankin (Stroke Patients Only)       Balance Overall balance assessment: Needs assistance Sitting-balance support: Bilateral upper extremity supported;Feet supported Sitting balance-Leahy Scale: Good Sitting balance - Comments: static sitting balance with no loss of balance   Standing balance support: Bilateral upper extremity supported Standing balance-Leahy Scale: Fair Standing balance comment: static standing with B UE on RW with no loss of balance                              Pertinent Vitals/Pain Pain Assessment: No/denies pain  HR - 78 to 88 bpm during session via telemetry O2 - 96 to 100% on RA during session via pulse ox    Home Living Family/patient expects to be discharged to:: Private residence Living Arrangements: Other (Comment) (Grandson and grandson's girlfriend) Available Help at Discharge: Family;Available 24 hours/day Type of Home: House Home Access: Stairs to enter Entrance Stairs-Rails: None Entrance Stairs-Number of Steps: 2-3 Home  Layout: One level Home Equipment: Walker - 4 wheels;Wheelchair - manual;Shower seat;Bedside commode;Grab bars - toilet      Prior Function Level of Independence: Needs assistance   Gait / Transfers Assistance Needed: Pt ambulates household  distances with 4ww and SBA for safety; uses manual w/c for longer/community distances.  Has 2 assist for stairs.     Comments: Assist from daughter Lupita Leash for ADL's/bathing PRN; independent with dressing; reports no recent falls     Hand Dominance   Dominant Hand: Right    Extremity/Trunk Assessment   Upper Extremity Assessment Upper Extremity Assessment: Generalized weakness RUE Deficits / Details: R UE grossly 4/5 for shoulder flexion, elbow flexion; R UE grossly 3+/5 for shoulder abduction and elbow extension LUE Deficits / Details: L UE grossly 4/5 for shoulder flexion, elbow flexion; L UE grossly 3+/5 for shoulder abduction and elbow extension    Lower Extremity Assessment Lower Extremity Assessment: Generalized weakness RLE Deficits / Details: R LE grossly at least 3/5 for hip flexion, knee flexion/extension, ankle DF LLE Deficits / Details: L LE grossly at least 3/5 for hip flexion, knee flexion/extension, ankle DF    Cervical / Trunk Assessment Cervical / Trunk Assessment: Normal  Communication   Communication: No difficulties  Cognition Arousal/Alertness: Awake/alert Behavior During Therapy: WFL for tasks assessed/performed Overall Cognitive Status: Within Functional Limits for tasks assessed                                 General Comments: oriented to self, place, situation, month and day; pt not oriented to year      General Comments      Exercises     Assessment/Plan    PT Assessment Patient needs continued PT services  PT Problem List Decreased strength;Decreased activity tolerance;Decreased balance;Decreased mobility       PT Treatment Interventions DME instruction;Gait training;Stair training;Functional mobility training;Therapeutic activities;Therapeutic exercise;Balance training;Patient/family education    PT Goals (Current goals can be found in the Care Plan section)  Acute Rehab PT Goals Patient Stated Goal: to get stronger PT  Goal Formulation: With patient Time For Goal Achievement: 05/11/17 Potential to Achieve Goals: Good    Frequency Min 2X/week   Barriers to discharge        Co-evaluation               AM-PAC PT "6 Clicks" Daily Activity  Outcome Measure Difficulty turning over in bed (including adjusting bedclothes, sheets and blankets)?: A Little Difficulty moving from lying on back to sitting on the side of the bed? : A Lot Difficulty sitting down on and standing up from a chair with arms (e.g., wheelchair, bedside commode, etc,.)?: Total Help needed moving to and from a bed to chair (including a wheelchair)?: A Little Help needed walking in hospital room?: A Little Help needed climbing 3-5 steps with a railing? : A Lot 6 Click Score: 14    End of Session Equipment Utilized During Treatment: Gait belt Activity Tolerance: Patient limited by fatigue Patient left: in chair;with call bell/phone within reach;with chair alarm set;with nursing/sitter in room Nurse Communication: Mobility status;Precautions PT Visit Diagnosis: Muscle weakness (generalized) (M62.81);History of falling (Z91.81);Difficulty in walking, not elsewhere classified (R26.2)    Time: 1610-9604 PT Time Calculation (min) (ACUTE ONLY): 38 min   Charges:         PT G CodesGwenlyn Found, SPT 2017-05-14,10:46 AM [phone]

## 2017-04-27 NOTE — Progress Notes (Signed)
[facility] Molly Wolf, [place] 04/27/17  Subjective:  Outpatient hemodialysis has been set for the patient. Overall the patient is improved from admission.   Objective:  Vital signs in last 24 hours:  Temp:  [97.7 F (36.5 C)-98.4 F (36.9 C)] 98.2 F (36.8 C) (07/17 1415) Pulse Rate:  [69-81] 69 (07/17 1415) Resp:  [16-18] 18 (07/17 1415) BP: (118-154)/(50-93) 129/93 (07/17 1415) SpO2:  [96 %-100 %] 100 % (07/17 1415) Weight:  [69.9 kg (154 lb)] 69.9 kg (154 lb) (07/17 0415)  Weight change: -1.814 kg (-4 lb) Filed Weights   04/25/17 0415 04/26/17 0511 04/27/17 0415  Weight: 73.1 kg (161 lb 3.2 oz) 71.7 kg (158 lb) 69.9 kg (154 lb)    Intake/Output:    Intake/Output Summary (Last 24 hours) at 04/27/17 1613 Last data filed at 04/27/17 0400  Gross per 24 hour  Intake                0 ml  Output              300 ml  Net             -300 ml     Physical Exam: General: Laying in the bed, no acute distress  HEENT anicteric  Neck supple  Pulm/lungs Normal breathing, mild rhonchi   CVS/Heart irregular  Abdomen:  Soft, NTND, BS present  Extremities: Trace edema  Neurologic: Alert and oreinted  Skin: Scattered ecchymosis          Basic Metabolic Panel:   Recent Labs Lab 04/21/17 0327 04/22/17 1449 04/23/17 0407 04/24/17 0733 04/26/17 0438  NA 141 141 139 139 139  K 5.4* 4.4 4.7 4.3 4.3  CL 103 101 101 100* 100*  CO2 29 29 28 30 30   GLUCOSE 134* 173* 234* 132* 206*  BUN 90* 75* 63* 37* 36*  CREATININE 4.51* 3.55* 3.10* 2.27* 2.42*  CALCIUM 8.6* 8.2* 8.3* 8.2* 8.4*  PHOS  --  3.7  --   --   --      CBC:  Recent Labs Lab 04/21/17 0327 04/22/17 1449 04/24/17 0733 04/26/17 0438 04/27/17 0920  WBC 5.8 4.6  --  6.2 5.5  HGB 9.9* 8.7* 8.7* 8.3* 8.5*  HCT 29.9* 27.0*  --  25.2* 26.6*  MCV 87.8 88.0  --  86.6 87.9  PLT 152 162  --  156 169      Lab Results  Component Value Date   HEPBSAG Negative 04/22/2017   HEPBSAB Non  Reactive 04/22/2017      Microbiology:  No results found for this or any previous visit (from the past 240 hour(s)).  Coagulation Studies:  Recent Labs  04/26/17 0438 04/27/17 0920  LABPROT 29.3* 26.0*  INR 2.71 2.33    Urinalysis: No results for input(s): COLORURINE, LABSPEC, PHURINE, GLUCOSEU, HGBUR, BILIRUBINUR, KETONESUR, PROTEINUR, UROBILINOGEN, NITRITE, LEUKOCYTESUR in the last 72 hours.  Invalid input(s): APPERANCEUR    Imaging: No results found.   Medications:    . amiodarone  200 mg Oral BID  . carbidopa-levodopa  1 tablet Oral QID  . chlorhexidine  15 mL Mouth Rinse BID  . docusate sodium  100 mg Oral BID  . escitalopram  20 mg Oral Daily  . feeding supplement (NEPRO CARB STEADY)  237 mL Oral TID BM  . hydrALAZINE  10 mg Oral BID  . insulin aspart  0-9 Units Subcutaneous TID WC  . insulin aspart  2 Units Subcutaneous TID WC  . insulin  glargine  5 Units Subcutaneous QHS  . isosorbide mononitrate  30 mg Oral Daily  . mouth rinse  15 mL Mouth Rinse q12n4p  . metoprolol tartrate  100 mg Oral BID  . multivitamin  15 mL Oral Daily  . saccharomyces boulardii  250 mg Oral Daily  . sodium chloride flush  3 mL Intravenous Q12H   acetaminophen **OR** acetaminophen, bisacodyl, loratadine, LORazepam, nitroGLYCERIN, ondansetron **OR** ondansetron (ZOFRAN) IV, polyethylene glycol, traMADol  Assessment/ Plan:  76 y.o. caucsian  female with coronary artery disease, congestive heart failure, pulmonary hypertension, parkinson's, diabetes mellitus type II, COPD, DVT on warfarin, atrial fibrillation, hyperlipidemia, anemia, anxiety, patellar fracture,  1. Acute renal failure with hyperkalemia on chronic kidney disease stage IV with proteinuria.  Baseline creatinine of 2.5, GFR of 17.  Chronic kidney disease secondary to hypertension and diabetes Acute renal failure secondary to overdiuresis and hypotension leading to ATN.  No IV contrast exposure.  - patient has high  chance to progress to end-stage renal disease.  [facility]  2. Diabetes mellitus type II with chronic kidney disease: insulin dependent. Hemoglobin A1c 7.5% on 6/24  3. Hyperkalemia - most recent serum potassium was down to 4.3.  Continue to periodically monitor.  4.  Anemia chronic kidney disease.  Hemoglobin low at 8.5.  Start Epogen as an outpatient.   LOS: 17 Mady HaagensenLATEEF, Elinora Weigand 7/17/20184:13 PM  [address] [place], [place] [phone]

## 2017-04-27 NOTE — Discharge Summary (Addendum)
[place] at [facility], 76 y.o., DOB 1940/12/16, MRN [id number]. Admission date: 04/09/2017 Discharge Date 04/27/2017 Primary MD Bergert, Luther Redo, MD Admitting Physician Harrie Foreman, MD  Admission Diagnosis  Nonresponsive  end stage renal disease rectus sheath hematoma; DVT on Coumadin  Discharge Diagnosis   Active Problems:   Acute respiratory failure with hypoxia (HCC)   Acute renal failure with acute tubular necrosis superimposed on stage 4 chronic kidney disease ([place])   Palliative care encounter   Acute combined systolic and diastolic heart failure (HCC)   Goals of care, counseling/discussion   Acute encephalopathy  Rectus abdominis hematoma due to chronic anticoagulation Coumadin stopped Pneumonia status post treatment Parkinson's disease Diabetes type 2 Chronic anemia Hyperlipidemia Atrial fibrillation with rapid ventricular rate Coumadin been held    Hospital Course  The patient with past medical history of CHF, atrial fibrillation, chronic kidney disease, diabetes and Parkinson's disease presents to the emergency department via EMS after an episode of possible cardiac arrest at home. The patient had just been discharged from the hospital about one hour before after hospitalization for CHF exacerbation. Patient was admitted for further evaluation. On further evaluation she was noted to have pneumonia. She was started on therapy with antibiotics. Patient also had progressive renal failure and was started on hemodialysis with just since continued. Patient also has a history of atrial fibrillation and is on chronic Coumadin. She started complaining of abdominal pain further workup revealed her to have rectus abdominis hematoma therefore Coumadin was discontinued. Her pain is now resolved. Hemoglobin is stable. At this point her Coumadin has been held her primary cardiologist will decide need to decide when to restart  this.      Palliative care team to follow at the skilled nursing for several     Consults  cardiology and nephrology vascular surgery Significant Tests:  See full reports for all details    Ct Abdomen Pelvis Wo Contrast  Result Date: 04/23/2017 CLINICAL DATA:  Generalized diffuse abd pain, pneumonia, Chronic anemia. Recent GI bleed. Hyperlipidemia unspecified on atorvastatin EXAM: CT ABDOMEN AND PELVIS WITHOUT CONTRAST TECHNIQUE: Multidetector CT imaging of the abdomen and pelvis was performed following the standard protocol without IV contrast. COMPARISON:  CT abdomen dated 12/07/2016. FINDINGS: Lower chest: Small bilateral pleural effusions. Small pericardial effusion at the heart base, grossly stable compared to previous exam. Cardiomegaly, incompletely imaged. Coronary artery calcifications. Hepatobiliary: No focal liver abnormality is seen. Gallbladder appears hyperdense which may indicate stones or sludge. Pancreas: Atrophic.  No acute findings. Spleen: Normal in size without focal abnormality. Adrenals/Urinary Tract: Adrenal glands are unremarkable. Small left renal cyst. No renal stone or hydronephrosis. No ureteral or bladder calculi identified. Stomach/Bowel: No dilated large or small bowel loops. Vascular/Lymphatic: Aortic atherosclerosis. No enlarged lymph nodes seen. Reproductive: Unremarkable. Other: Small amount of free fluid in the upper abdomen. Moderate amount of free fluid in the pelvis. No circumscribed abscess collection seen. No free intraperitoneal air. Musculoskeletal: Degenerative changes of the slightly scoliotic thoracolumbar spine, at least moderate in degree. Acute hematoma within the lower right rectus abdominus musculature, measuring approximately 4.5 cm thickness and approximately 8 cm craniocaudal extent. Ill-defined fluid/edema throughout the subcutaneous soft tissues indicating anasarca. IMPRESSION: 1. Acute spontaneous intramuscular hematoma within the lower right  rectus abdominus musculature, just above the level of the bladder, measuring 4.5 cm thickness and approximately 8 cm craniocaudal extent. 2. Ascites within the upper abdomen and pelvis, small to moderate in size. 3. Small  bilateral pleural effusions. 4. Anasarca. 5. Small pericardial effusion appears stable. 6. Cardiomegaly with diffuse coronary artery calcifications. 7. Aortic atherosclerosis. These results were called by telephone at the time of interpretation on 04/23/2017 at 12:05 pm to Dr. Loletha Grayer , who verbally acknowledged these results. Electronically Signed   By: Franki Cabot M.D.   On: 04/23/2017 12:10   Dg Chest 2 View  Result Date: 04/11/2017 CLINICAL DATA:  Pneumonia EXAM: CHEST  2 VIEW COMPARISON:  04/09/2017 FINDINGS: Cardiomegaly with vascular congestion. Small bilateral pleural effusions. Left lower lobe atelectasis or infiltrate, similar prior study. IMPRESSION: Left lower lobe atelectasis or infiltrate, unchanged. Small bilateral effusions. Cardiomegaly. Electronically Signed   By: Rolm Baptise M.D.   On: 04/11/2017 10:47   Ct Head Wo Contrast  Result Date: 04/15/2017 CLINICAL DATA:  Confusion. EXAM: CT HEAD WITHOUT CONTRAST TECHNIQUE: Contiguous axial images were obtained from the base of the skull through the vertex without intravenous contrast. COMPARISON:  04/09/2017 FINDINGS: Brain: There is central and cortical atrophy. Periventricular white matter changes are consistent with small vessel disease. There is no intra or extra-axial fluid collection or mass lesion. The basilar cisterns and ventricles have a normal appearance. There is no CT evidence for acute infarction or hemorrhage. Vascular: There is atherosclerotic calcification of the carotid siphonsand vertebral arteries. Skull: Hyperostosis. Negative for fracture or focal lesion. Sinuses/Orbits: No acute finding. Other: None IMPRESSION: 1.  No evidence for acute intracranial abnormality. 2. Atrophy and small vessel disease.  Electronically Signed   By: Nolon Nations M.D.   On: 04/15/2017 09:28   Ct Head Wo Contrast  Result Date: 04/09/2017 CLINICAL DATA:  76 year old female with weakness and altered mental status. EXAM: CT HEAD WITHOUT CONTRAST TECHNIQUE: Contiguous axial images were obtained from the base of the skull through the vertex without intravenous contrast. COMPARISON:  12/27/2016 and prior CT FINDINGS: Brain: No evidence of acute infarction, hemorrhage, hydrocephalus, extra-axial collection or mass lesion/mass effect. Atrophy, chronic small-vessel white matter ischemic changes and remote left caudate infarct again noted. Vascular: Intracranial atherosclerotic calcifications noted. Skull: Normal. Negative for fracture or focal lesion. Sinuses/Orbits: No acute finding. Other: None IMPRESSION: No evidence of acute intracranial abnormality. Atrophy, chronic small-vessel white matter ischemic changes and remote left caudate infarct. Electronically Signed   By: Margarette Canada M.D.   On: 04/09/2017 23:38   Ct Chest Wo Contrast  Result Date: 04/10/2017 CLINICAL DATA:  Discharged this evening, now returning with weakness and unresponsiveness. EXAM: CT CHEST WITHOUT CONTRAST TECHNIQUE: Multidetector CT imaging of the chest was performed following the standard protocol without IV contrast. COMPARISON:  04/09/2017 FINDINGS: Cardiovascular: Cardiomegaly. Ascending aortic aneurysm, 4.8 cm-5.0. Arch and descending aorta are normal in caliber. Aortic and coronary atherosclerotic calcifications. Small pericardial effusion. Mediastinum/Nodes: No pathologic adenopathy in the mediastinum or hila. Lungs/Pleura: Small to moderate pleural effusions, left slightly larger than right. Posterior left lower lobe patchy airspace opacity could be atelectatic due to the adjacent effusion but cannot exclude a small focus of infectious infiltrate. Central airways are patent and normal in caliber. Mild mosaic attenuation in the upper lobes likely  reflects air trapping. Upper Abdomen: No acute findings in the upper abdomen. Small volume perihepatic ascites. Musculoskeletal: No significant skeletal lesion. IMPRESSION: 1. Ascending thoracic aortic aneurysm. Recommend semi-annual imaging followup by CTA or MRA and referral to cardiothoracic surgery if not already obtained. This recommendation follows 2010 ACCF/AHA/AATS/ACR/ASA/SCA/SCAI/SIR/STS/SVM Guidelines for the Diagnosis and Management of Patients With Thoracic Aortic Disease. Circulation. 2010; 121: J031-R945 2. Aortic and  coronary atherosclerosis. 3. Small to moderate pleural effusions bilaterally. 4. Patchy airspace opacity in the posterior left lower lobe, atelectasis versus infectious infiltrate. Mild air trapping in the upper lobes. 5. Small volume ascites in the upper abdomen. Aortic Atherosclerosis (ICD10-I70.0). Electronically Signed   By: Andreas Newport M.D.   On: 04/10/2017 00:28   Dg Chest Portable 1 View  Result Date: 04/09/2017 CLINICAL DATA:  Weakness unresponsive EXAM: PORTABLE CHEST 1 VIEW COMPARISON:  04/03/2017 FINDINGS: Moderate cardiomegaly with central vascular congestion, increased compared to prior. Left basilar atelectasis or infiltrate. No pneumothorax. Old right-sided rib fractures. IMPRESSION: Cardiomegaly with increased central vascular congestion compared to prior. Increased opacity at the left lung base may reflect atelectasis or an infiltrate Electronically Signed   By: Donavan Foil M.D.   On: 04/09/2017 22:51   Dg Chest Portable 1 View  Result Date: 04/03/2017 CLINICAL DATA:  Cough and shortness of breath EXAM: PORTABLE CHEST 1 VIEW COMPARISON:  Chest radiograph 02/15/2017 FINDINGS: There is massive cardiomegaly. Differences from the prior study may be due to AP technique currently, which exaggerates the heart size. There is no pneumothorax or sizable pleural effusion. There is bibasilar atelectasis. No other consolidation. No pulmonary edema. IMPRESSION: Massive  cardiomegaly and bibasilar atelectasis without pulmonary edema. Electronically Signed   By: Ulyses Jarred M.D.   On: 04/03/2017 19:38       Today   Subjective:   Molly Wolf  patient feeling better denies any complaints  Objective:   Blood pressure (!) 129/93, pulse 69, temperature 98.2 F (36.8 C), resp. rate 18, height 5' 7"  (1.702 m), weight 154 lb (69.9 kg), SpO2 100 %.  .  Intake/Output Summary (Last 24 hours) at 04/27/17 1444 Last data filed at 04/27/17 0400  Gross per 24 hour  Intake                0 ml  Output              300 ml  Net             -300 ml    Exam VITAL SIGNS: Blood pressure (!) 129/93, pulse 69, temperature 98.2 F (36.8 C), resp. rate 18, height 5' 7"  (1.702 m), weight 154 lb (69.9 kg), SpO2 100 %.  GENERAL:  76 y.o.-year-old patient lying in the bed with no acute distress.  EYES: Pupils equal, round, reactive to light and accommodation. No scleral icterus. Extraocular muscles intact.  HEENT: Head atraumatic, normocephalic. Oropharynx and nasopharynx clear.  NECK:  Supple, no jugular venous distention. No thyroid enlargement, no tenderness.  LUNGS: Normal breath sounds bilaterally, no wheezing, rales,rhonchi or crepitation. No use of accessory muscles of respiration.  CARDIOVASCULAR: S1, S2 normal. No murmurs, rubs, or gallops.  ABDOMEN: Soft, nontender, nondistended. Bowel sounds present. No organomegaly or mass.  EXTREMITIES: No pedal edema, cyanosis, or clubbing.  NEUROLOGIC: Cranial nerves II through XII are intact. Muscle strength 5/5 in all extremities. Sensation intact. Gait not checked.  PSYCHIATRIC: The patient is alert and oriented x 3.  SKIN: No obvious rash, lesion, or ulcer.   Data Review     CBC w Diff: Lab Results  Component Value Date   WBC 5.5 04/27/2017   HGB 8.5 (L) 04/27/2017   HCT 26.6 (L) 04/27/2017   PLT 169 04/27/2017   LYMPHOPCT 21 04/09/2017   MONOPCT 7 04/09/2017   EOSPCT 3 04/09/2017   BASOPCT 1 04/09/2017    CMP: Lab Results  Component Value Date   NA  139 04/26/2017   K 4.3 04/26/2017   CL 100 (L) 04/26/2017   CO2 30 04/26/2017   BUN 36 (H) 04/26/2017   CREATININE 2.42 (H) 04/26/2017   PROT 6.5 04/09/2017   ALBUMIN 3.0 (L) 04/22/2017   BILITOT 1.1 04/09/2017   ALKPHOS 63 04/09/2017   AST 23 04/09/2017   ALT <5 (L) 04/09/2017  .  Micro Results Recent Results (from the past 240 hour(s))  C difficile quick scan w PCR reflex     Status: None   Collection Time: 04/17/17  2:50 PM  Result Value Ref Range Status   C Diff antigen NEGATIVE NEGATIVE Final   C Diff toxin NEGATIVE NEGATIVE Final   C Diff interpretation No C. difficile detected.  Final  Gastrointestinal Panel by PCR , Stool     Status: None   Collection Time: 04/17/17  2:50 PM  Result Value Ref Range Status   Campylobacter species NOT DETECTED NOT DETECTED Final   Plesimonas shigelloides NOT DETECTED NOT DETECTED Final   Salmonella species NOT DETECTED NOT DETECTED Final   Yersinia enterocolitica NOT DETECTED NOT DETECTED Final   Vibrio species NOT DETECTED NOT DETECTED Final   Vibrio cholerae NOT DETECTED NOT DETECTED Final   Enteroaggregative E coli (EAEC) NOT DETECTED NOT DETECTED Final   Enteropathogenic E coli (EPEC) NOT DETECTED NOT DETECTED Final   Enterotoxigenic E coli (ETEC) NOT DETECTED NOT DETECTED Final   Shiga like toxin producing E coli (STEC) NOT DETECTED NOT DETECTED Final   Shigella/Enteroinvasive E coli (EIEC) NOT DETECTED NOT DETECTED Final   Cryptosporidium NOT DETECTED NOT DETECTED Final   Cyclospora cayetanensis NOT DETECTED NOT DETECTED Final   Entamoeba histolytica NOT DETECTED NOT DETECTED Final   Giardia lamblia NOT DETECTED NOT DETECTED Final   Adenovirus F40/41 NOT DETECTED NOT DETECTED Final   Astrovirus NOT DETECTED NOT DETECTED Final   Norovirus GI/GII NOT DETECTED NOT DETECTED Final   Rotavirus A NOT DETECTED NOT DETECTED Final   Sapovirus (I, II, IV, and V) NOT DETECTED NOT DETECTED  Final        Code Status Orders        Start     Ordered   04/10/17 0328  Full code  Continuous     04/10/17 0327    Code Status History    Date Active Date Inactive Code Status Order ID Comments User Context   04/03/2017 11:30 PM 04/09/2017 10:09 PM Full Code [id number]  Hugelmeyer, Alexis, DO ED   02/15/2017  6:16 PM 02/16/2017  6:18 PM Full Code [id number]  Vaughan Basta, MD Inpatient   01/26/2017  2:44 PM 01/29/2017  9:47 PM Full Code [id number]  Loletha Grayer, MD ED   12/28/2016  7:44 AM 12/29/2016  4:34 PM DNR [id number]  Max Sane, MD Inpatient   12/27/2016 11:06 PM 12/28/2016  7:44 AM Full Code [id number]  Hugelmeyer, Ubaldo Glassing, DO Inpatient    Advance Directive Documentation     Most Recent Value  Type of Advance Directive  Healthcare Power of Attorney  Pre-existing out of facility DNR order (yellow form or pink MOST form)  -  "MOST" Form in Place?  -          Follow-up Information    [place] Follow up on 05/05/2017.   Specialty:  Cardiology Why:  at 11:20am  Contact information: [place] [phone]          Discharge Medications  Allergies as of 04/27/2017   No Known Allergies     Medication List    STOP taking these medications   blood glucose meter kit and supplies Kit   furosemide 20 MG tablet Commonly known as:  LASIX   glucose blood test strip Commonly known as:  ACCU-CHEK AVIVA PLUS   insulin lispro 100 UNIT/ML injection Commonly known as:  HUMALOG   warfarin 2 MG tablet Commonly known as:  COUMADIN   warfarin 3 MG tablet Commonly known as:  COUMADIN     TAKE these medications   acetaminophen 325 MG tablet Commonly known as:  TYLENOL Take 650 mg by mouth every 6 (six) hours as needed for mild pain.   amiodarone 200 MG tablet Commonly known as:  PACERONE Take 1 tablet (200 mg total) by mouth 2 (two) times daily.    atorvastatin 20 MG tablet Commonly known as:  LIPITOR Take 20 mg by mouth daily.   Carbidopa-Levodopa ER 25-100 MG tablet controlled release Commonly known as:  SINEMET CR Take 2 tablets by mouth 4 (four) times daily.   CoQ10 50 MG Caps Take 50 mg by mouth daily.   CRANBERRY PO Take 1 tablet by mouth daily.   docusate sodium 100 MG capsule Commonly known as:  COLACE Take 1 capsule (100 mg total) by mouth 2 (two) times daily.   escitalopram 20 MG tablet Commonly known as:  LEXAPRO Take 20 mg by mouth daily.   feeding supplement (NEPRO CARB STEADY) Liqd Take 237 mLs by mouth 3 (three) times daily between meals.   Fish Oil 1000 MG Caps Take 1,000 mg by mouth daily.   gabapentin 300 MG capsule Commonly known as:  NEURONTIN Take 300 mg by mouth daily.   hydrALAZINE 10 MG tablet Commonly known as:  APRESOLINE Take 1 tablet (10 mg total) by mouth 2 (two) times daily. What changed:  medication strength  how much to take   insulin aspart 100 UNIT/ML injection Commonly known as:  novoLOG Inject 2 Units into the skin 3 (three) times daily with meals.   insulin glargine 100 UNIT/ML injection Commonly known as:  LANTUS Inject 0.05 mLs (5 Units total) into the skin at bedtime. What changed:  how much to take   isosorbide mononitrate 60 MG 24 hr tablet Commonly known as:  IMDUR Take 1 tablet (60 mg total) by mouth daily.   loratadine 10 MG tablet Commonly known as:  CLARITIN Take 5 mg by mouth daily as needed for allergies.   LORazepam 1 MG tablet Commonly known as:  ATIVAN Take 1 tablet (1 mg total) by mouth 2 (two) times daily as needed for anxiety.   metoprolol tartrate 100 MG tablet Commonly known as:  LOPRESSOR Take 1 tablet (100 mg total) by mouth 2 (two) times daily. What changed:  medication strength  how much to take   MYRBETRIQ 25 MG Tb24 tablet Generic drug:  mirabegron ER Take 25 mg by mouth daily.   nitroGLYCERIN 0.4 MG SL tablet Commonly  known as:  NITROSTAT Place 0.4 mg under the tongue every 5 (five) minutes as needed for chest pain.   polyethylene glycol packet Commonly known as:  MIRALAX / GLYCOLAX Take 17 g by mouth daily as needed for mild constipation.   promethazine 25 MG tablet Commonly known as:  PHENERGAN Take 25 mg by mouth every 6 (six) hours as needed for nausea or vomiting.   saccharomyces boulardii 250 MG capsule Commonly known as:  FLORASTOR Take 250 mg  by mouth daily.   traMADol 50 MG tablet Commonly known as:  ULTRAM Take 1 tablet (50 mg total) by mouth daily as needed. What changed:  reasons to take this          Total Time in preparing paper work, data evaluation and todays exam - 35 minutes  Dustin Flock M.D on 04/27/2017 at 2:44 PM  [facility]   Office  [phone]

## 2017-04-27 NOTE — Telephone Encounter (Signed)
S/w Medical Records staff at [facility]. Home Health certification and plan of care orders are being sent to Dr End to sign. In review, Dr End preferred for these documents to go to PCP. Dr End will continue to sign the PT/INR orders as needed. Representative at [facility] agreed and will have that specific type of care documents to go to patient's PCP instead. I expressed by appreciation.

## 2017-04-27 NOTE — [place] FL2 (Addendum)
[place] MEDICAID FL2 LEVEL OF CARE SCREENING TOOL     IDENTIFICATION  Patient Name: Molly Wolf Birthdate: 23-Feb-1941 Sex: female Admission Date (Current Location): 04/09/2017  [place] and [place] Number:  Chiropodist and Address:  [facility], [address] [place], [place] [address]      Provider Number: [id number]  Attending Physician Name and Address:  Auburn Bilberry, MD  Relative Name and Phone Number:  Freida Busman Daughter [phone]  [phone]     Current Level of Care: Hospital Recommended Level of Care: Skilled Nursing Facility Prior Approval Number:    Date Approved/Denied:   PASRR Number: [id number]  Discharge Plan: SNF    Current Diagnoses: Patient Active Problem List   Diagnosis Date Noted  . Confusion   . Goals of care, counseling/discussion   . Acute renal failure with acute tubular necrosis superimposed on stage 4 chronic kidney disease (HCC)   . Palliative care encounter   . Acute combined systolic and diastolic heart failure (HCC)   . Acute respiratory failure with hypoxia (HCC) 04/10/2017  . Acute combined systolic (congestive) and diastolic (congestive) heart failure (HCC) 04/03/2017  . Coronary artery disease of native artery of native heart with stable angina pectoris (HCC) 03/31/2017  . Paroxysmal atrial fibrillation (HCC) 03/31/2017  . Chronic diastolic heart failure (HCC) 02/26/2017  . HTN (hypertension) 02/26/2017  . Parkinson disease (HCC) 02/26/2017  . Intractable pain 12/27/2016    Orientation RESPIRATION BLADDER Height & Weight     Self, Situation, Place  Normal Continent Weight: 154 lb (69.9 kg) Height:  5\' 7"  (170.2 cm)  BEHAVIORAL SYMPTOMS/MOOD NEUROLOGICAL BOWEL NUTRITION STATUS      Continent Diet (Carb Modified Renal)  AMBULATORY STATUS COMMUNICATION OF NEEDS Skin   Limited Assist Verbally Normal                       Personal Care Assistance Level of Assistance   Bathing, Feeding, Dressing Bathing Assistance: Limited assistance Feeding assistance: Independent Dressing Assistance: Limited assistance     Functional Limitations Info  Sight, Hearing, Speech Sight Info: Adequate Hearing Info: Adequate Speech Info: Adequate    SPECIAL CARE FACTORS FREQUENCY  PT (By licensed PT)     PT Frequency: 5x a week              Contractures Contractures Info: Not present    Additional Factors Info  Code Status, Allergies, Psychotropic, Insulin Sliding Scale Code Status Info: Full Code Allergies Info: NKA Psychotropic Info: escitalopram (LEXAPRO) tablet 20 mg Insulin Sliding Scale Info: insulin aspart (novoLOG) injection 0-9 Units 3x a day with meals       Current Medications (04/27/2017):  This is the current hospital active medication list Current Facility-Administered Medications  Medication Dose Route Frequency Provider Last Rate Last Dose  . acetaminophen (TYLENOL) tablet 650 mg  650 mg Oral Q6H PRN Arnaldo Natal, MD   650 mg at 04/25/17 1707   Or  . acetaminophen (TYLENOL) suppository 650 mg  650 mg Rectal Q6H PRN Arnaldo Natal, MD      . amiodarone (PACERONE) tablet 200 mg  200 mg Oral BID Lorine Bears A, MD   200 mg at 04/27/17 0955  . bisacodyl (DULCOLAX) EC tablet 5 mg  5 mg Oral Daily PRN Gracelyn Nurse, MD      . carbidopa-levodopa (SINEMET CR) 50-200 MG per tablet controlled release 1 tablet  1 tablet Oral QID Auburn Bilberry, MD   1  tablet at 04/27/17 0956  . chlorhexidine (PERIDEX) 0.12 % solution 15 mL  15 mL Mouth Rinse BID Gracelyn NurseJohnston, John D, MD   15 mL at 04/27/17 0955  . docusate sodium (COLACE) capsule 100 mg  100 mg Oral BID Arnaldo Nataliamond, Michael S, MD   100 mg at 04/27/17 0955  . escitalopram (LEXAPRO) tablet 20 mg  20 mg Oral Daily Arnaldo Nataliamond, Michael S, MD   20 mg at 04/27/17 0954  . feeding supplement (NEPRO CARB STEADY) liquid 237 mL  237 mL Oral TID BM Alford HighlandWieting, Richard, MD   237 mL at 04/27/17 1004  . hydrALAZINE  (APRESOLINE) tablet 10 mg  10 mg Oral BID Eula ListenDunn, Ryan M, PA-C   10 mg at 04/27/17 0955  . insulin aspart (novoLOG) injection 0-9 Units  0-9 Units Subcutaneous TID WC Arnaldo Nataliamond, Michael S, MD   2 Units at 04/27/17 [id number]  . insulin aspart (novoLOG) injection 2 Units  2 Units Subcutaneous TID WC Alford HighlandWieting, Richard, MD   2 Units at 04/27/17 0818  . insulin glargine (LANTUS) injection 5 Units  5 Units Subcutaneous QHS Alford HighlandWieting, Richard, MD   5 Units at 04/25/17 2158  . isosorbide mononitrate (IMDUR) 24 hr tablet 30 mg  30 mg Oral Daily Eula ListenDunn, Ryan M, PA-C   30 mg at 04/27/17 0955  . loratadine (CLARITIN) tablet 5 mg  5 mg Oral Daily PRN Arnaldo Nataliamond, Michael S, MD      . LORazepam (ATIVAN) tablet 1 mg  1 mg Oral BID PRN Arnaldo Nataliamond, Michael S, MD   1 mg at 04/16/17 [id number]  . MEDLINE mouth rinse  15 mL Mouth Rinse q12n4p Gracelyn NurseJohnston, John D, MD   15 mL at 04/25/17 1709  . metoprolol tartrate (LOPRESSOR) tablet 100 mg  100 mg Oral BID Gracelyn NurseJohnston, John D, MD   100 mg at 04/27/17 0955  . multivitamin liquid 15 mL  15 mL Oral Daily Marcelino DusterJohnston, John D, MD   15 mL at 04/27/17 0956  . nitroGLYCERIN (NITROSTAT) SL tablet 0.4 mg  0.4 mg Sublingual Q5 min PRN Arnaldo Nataliamond, Michael S, MD      . ondansetron [facility](ZOFRAN) tablet 4 mg  4 mg Oral Q6H PRN Arnaldo Nataliamond, Michael S, MD       Or  . ondansetron [facility](ZOFRAN) injection 4 mg  4 mg Intravenous Q6H PRN Arnaldo Nataliamond, Michael S, MD   4 mg at 04/26/17 1232  . polyethylene glycol (MIRALAX / GLYCOLAX) packet 17 g  17 g Oral Daily PRN Arnaldo Nataliamond, Michael S, MD   17 g at 04/16/17 [id number]  . saccharomyces boulardii (FLORASTOR) capsule 250 mg  250 mg Oral Daily Arnaldo Nataliamond, Michael S, MD   250 mg at 04/27/17 0957  . sodium chloride flush (NS) 0.9 % injection 3 mL  3 mL Intravenous Q12H Alford HighlandWieting, Richard, MD   3 mL at 04/26/17 2200  . traMADol (ULTRAM) tablet 50 mg  50 mg Oral Q12H PRN Oralia ManisWillis, David, MD   50 mg at 04/25/17 [id number]     Discharge Medications: Please see discharge summary for a list of discharge  medications.  Relevant Imaging Results:  Relevant Lab Results:   Additional Information SSN [id number]  [facility]  Anterhaus, Ervin KnackEric R, LCSWA

## 2017-04-27 NOTE — Care Management (Signed)
Attending anticipates discharge to skilled nursing today.  Updated [facility].  Notified Dimas ChyleAmanda Morris with [facility] to receive specifics on outpatient dialysis schedule.  At present anticipate [facility] but time is not confirmed

## 2017-05-05 ENCOUNTER — Ambulatory Visit: Payer: Medicare Other | Admitting: Family

## 2017-05-11 ENCOUNTER — Ambulatory Visit: Payer: Medicare Other | Attending: Family | Admitting: Family

## 2017-05-11 ENCOUNTER — Encounter: Payer: Self-pay | Admitting: Family

## 2017-05-11 ENCOUNTER — Ambulatory Visit: Payer: Medicare Other | Admitting: Family

## 2017-05-11 VITALS — BP 151/95 | HR 76 | Resp 18 | Ht 67.0 in | Wt 146.0 lb

## 2017-05-11 DIAGNOSIS — E1122 Type 2 diabetes mellitus with diabetic chronic kidney disease: Secondary | ICD-10-CM | POA: Diagnosis not present

## 2017-05-11 DIAGNOSIS — I2510 Atherosclerotic heart disease of native coronary artery without angina pectoris: Secondary | ICD-10-CM | POA: Insufficient documentation

## 2017-05-11 DIAGNOSIS — M1990 Unspecified osteoarthritis, unspecified site: Secondary | ICD-10-CM | POA: Diagnosis not present

## 2017-05-11 DIAGNOSIS — E785 Hyperlipidemia, unspecified: Secondary | ICD-10-CM | POA: Diagnosis not present

## 2017-05-11 DIAGNOSIS — Z79899 Other long term (current) drug therapy: Secondary | ICD-10-CM | POA: Insufficient documentation

## 2017-05-11 DIAGNOSIS — I5042 Chronic combined systolic (congestive) and diastolic (congestive) heart failure: Secondary | ICD-10-CM | POA: Diagnosis present

## 2017-05-11 DIAGNOSIS — I4891 Unspecified atrial fibrillation: Secondary | ICD-10-CM | POA: Diagnosis not present

## 2017-05-11 DIAGNOSIS — I10 Essential (primary) hypertension: Secondary | ICD-10-CM

## 2017-05-11 DIAGNOSIS — F419 Anxiety disorder, unspecified: Secondary | ICD-10-CM | POA: Diagnosis not present

## 2017-05-11 DIAGNOSIS — G20 Parkinson's disease: Secondary | ICD-10-CM | POA: Insufficient documentation

## 2017-05-11 DIAGNOSIS — Z794 Long term (current) use of insulin: Secondary | ICD-10-CM | POA: Insufficient documentation

## 2017-05-11 DIAGNOSIS — N184 Chronic kidney disease, stage 4 (severe): Secondary | ICD-10-CM | POA: Insufficient documentation

## 2017-05-11 DIAGNOSIS — I130 Hypertensive heart and chronic kidney disease with heart failure and stage 1 through stage 4 chronic kidney disease, or unspecified chronic kidney disease: Secondary | ICD-10-CM | POA: Insufficient documentation

## 2017-05-11 DIAGNOSIS — Z992 Dependence on renal dialysis: Secondary | ICD-10-CM | POA: Insufficient documentation

## 2017-05-11 DIAGNOSIS — J449 Chronic obstructive pulmonary disease, unspecified: Secondary | ICD-10-CM | POA: Insufficient documentation

## 2017-05-11 DIAGNOSIS — F329 Major depressive disorder, single episode, unspecified: Secondary | ICD-10-CM | POA: Diagnosis not present

## 2017-05-11 DIAGNOSIS — I480 Paroxysmal atrial fibrillation: Secondary | ICD-10-CM

## 2017-05-11 DIAGNOSIS — I5032 Chronic diastolic (congestive) heart failure: Secondary | ICD-10-CM

## 2017-05-11 NOTE — Patient Instructions (Signed)
Continue weighing daily and call for an overnight weight gain of > 2 pounds or a weekly weight gain of >5 pounds. 

## 2017-05-11 NOTE — Progress Notes (Signed)
Patient ID: Renella CunasJudy Batdorf, female    DOB: 1941/04/12, 76 y.o.   MRN: [id number]  HPI  Ms [name] is a 76 y/o female with a history of anxiety, arthritis, CKD, COPD, CAD, depression, DM, DVT, hyperlipidemia, HTN, parkinson's and chronic heart failure.   Reviewed echo done 04/04/17 which showed an EF of 35% along with moderate MR and an elevated PA pressure of 65 mm Hg. EF has declined from previous echo done 01/26/17 which showed an EF of 45-50% along with mild/mod AS, mild AR, mild/mod TR along with moderately/severely elevated PA pressure of >50 mm Hg.  Admitted 04/09/17 due to possible cardiac arrest at home. On further evaluation, she was found to have pneumonia and was started on antibiotics. Had progressive renal failure and was started on hemodialysis. Had abdominal pain and was found to have rectus abdominis hematoma so her coumadin was stopped. Discharged back to SNF after 18 days. Admitted 04/03/17 due to HF exacerbation. Cardiology consult obtained. EGD was negative for GI bleed. Discharged home with home health after 6 days.  Admitted 02/15/17 due to HF exacerbation. Initially needed IV diuretics and transitioned back to oral diuretics. Discharged the following day. Was in the ED on 02/08/17 due to elevated INR level. Evaluated and discharged the same day. Admitted 01/26/17 due to HF exacerbation. Treated with IV diuretics and did not discharge with oral diuretics. Lost ~ 5L of fluids during hospitalization. Acute DVT treated initially with heparin drip and then transitioned onto warfarin. Cardiology consult obtained. [facility]   She presents today for her follow-up visit with a chief complaint of moderate fatigue with little exertion. She says that this has been present for a few years but with varying levels of severity. Does have associated shortness of breath, cough, weakness and dizziness along with this.   Past Medical History:  Diagnosis Date  . Anxiety    . Arthritis   . Chronic combined systolic and diastolic CHF (congestive heart failure) (HCC)    a. 01/2008 Echo ([facility]): EF > 55%, mod-sev LVH w/ grade 1 DD, biatrial enlargement, mild AS, trace MR/TR; b. EF 45-50%, GR2DD, mild to moderate aortic stenosis, mild aortic insufficieny, mild to moderate mitral regurgitation, mild tricuspid regurgitation, mild biatrial enlargement, and a small pericardial effusion  . CKD (chronic kidney disease), stage IV (HCC)   . Closed patellar sleeve fracture of right knee    a. 01/2017 - conservatively managed.  Marland Kitchen. COPD (chronic obstructive pulmonary disease) (HCC)   . Coronary artery disease    a. s/p remote stenting of unknown vessel @ [facility].  . Depression   . Diabetes mellitus with complication (HCC)   . DVT (deep venous thrombosis) (HCC)    a. in the setting of right patellar fracture on Coumadin  . Hyperlipidemia   . Hypertension   . Parkinson disease [facility](HCC)    Past Surgical History:  Procedure Laterality Date  . BACK SURGERY    . CORONARY ANGIOPLASTY WITH STENT PLACEMENT    . DIALYSIS/PERMA CATHETER INSERTION N/A 04/22/2017   Procedure: Dialysis/Perma Catheter Insertion;  Surgeon: Annice Needyew, Jason S, MD;  Location: [facility] INVASIVE CV LAB;  Service: Cardiovascular;  Laterality: N/A;  . ESOPHAGOGASTRODUODENOSCOPY N/A 04/09/2017   Procedure: ESOPHAGOGASTRODUODENOSCOPY (EGD);  Surgeon: Wyline MoodAnna, Kiran, MD;  [facility];  Service: Endoscopy;  Laterality: N/A;  . IVC FILTER INSERTION N/A 04/23/2017   Procedure: IVC Filter Insertion;  Surgeon: Renford DillsSchnier, Gregory G, MD;  Location: [facility] INVASIVE CV LAB;  Service:  Cardiovascular;  Laterality: N/A;  . NECK SURGERY     Family History  Problem Relation Age of Onset  . Cervical cancer Mother   . Kidney failure Father   . CAD Father   . CAD Brother   . Prostate cancer Neg Hx   . Kidney cancer Neg Hx   . Bladder Cancer Neg Hx    Social History  Substance Use Topics  . Smoking status: Never Smoker  . Smokeless  tobacco: Never Used  . Alcohol use No   No Known Allergies  Prior to Admission medications   Medication Sig Start Date End Date Taking? Authorizing Provider  acetaminophen (TYLENOL) 325 MG tablet Take 650 mg by mouth every 6 (six) hours as needed for mild pain.   Yes [provider]  amiodarone (PACERONE) 200 MG tablet Take 1 tablet (200 mg total) by mouth 2 (two) times daily. 04/27/17  Yes Auburn Bilberry, MD  atorvastatin (LIPITOR) 20 MG tablet Take 20 mg by mouth daily.   Yes [provider]  Carbidopa-Levodopa ER (SINEMET CR) 25-100 MG tablet controlled release Take 2 tablets by mouth 4 (four) times daily. 03/05/17  Yes Isack Lavalley A, FNP  Coenzyme Q10 (COQ10) 50 MG CAPS Take 50 mg by mouth daily.   Yes [provider]  CRANBERRY PO Take 1 tablet by mouth daily.   Yes [provider]  docusate sodium (COLACE) 100 MG capsule Take 1 capsule (100 mg total) by mouth 2 (two) times daily. 04/27/17  Yes Auburn Bilberry, MD  escitalopram (LEXAPRO) 20 MG tablet Take 20 mg by mouth daily.   Yes [provider]  gabapentin (NEURONTIN) 300 MG capsule Take 300 mg by mouth daily.    Yes [provider]  hydrALAZINE (APRESOLINE) 10 MG tablet Take 1 tablet (10 mg total) by mouth 2 (two) times daily. 04/27/17  Yes Auburn Bilberry, MD  insulin aspart (NOVOLOG) 100 UNIT/ML injection Inject 2 Units into the skin 3 (three) times daily with meals. 04/27/17  Yes Auburn Bilberry, MD  insulin glargine (LANTUS) 100 UNIT/ML injection Inject 0.05 mLs (5 Units total) into the skin at bedtime. 04/27/17  Yes Auburn Bilberry, MD  isosorbide mononitrate (IMDUR) 60 MG 24 hr tablet Take 1 tablet (60 mg total) by mouth daily. 03/05/17  Yes Shanquita Ronning, Inetta Fermo A, FNP  loratadine (CLARITIN) 10 MG tablet Take 5 mg by mouth daily as needed for allergies.    Yes [provider]  LORazepam (ATIVAN) 1 MG tablet Take 1 tablet (1 mg total) by mouth 2 (two) times daily as needed for  anxiety. 04/27/17  Yes Auburn Bilberry, MD  metoprolol tartrate (LOPRESSOR) 100 MG tablet Take 1 tablet (100 mg total) by mouth 2 (two) times daily. 04/27/17  Yes Auburn Bilberry, MD  mirabegron ER (MYRBETRIQ) 25 MG TB24 tablet Take 25 mg by mouth daily.   Yes [provider]  nitroGLYCERIN (NITROSTAT) 0.4 MG SL tablet Place 0.4 mg under the tongue every 5 (five) minutes as needed for chest pain.   Yes [provider]  Nutritional Supplements (FEEDING SUPPLEMENT, NEPRO CARB STEADY,) LIQD Take 237 mLs by mouth 3 (three) times daily between meals. 04/27/17  Yes Auburn Bilberry, MD  Omega-3 Fatty Acids (FISH OIL) 1000 MG CAPS Take 1,000 mg by mouth daily.   Yes [provider]  polyethylene glycol (MIRALAX / GLYCOLAX) packet Take 17 g by mouth daily as needed for mild constipation.   Yes [provider]  promethazine (PHENERGAN) 25 MG tablet  Take 25 mg by mouth every 6 (six) hours as needed for nausea or vomiting.   Yes [provider]  saccharomyces boulardii (FLORASTOR) 250 MG capsule Take 250 mg by mouth daily.   Yes [provider]  traMADol (ULTRAM) 50 MG tablet Take 1 tablet (50 mg total) by mouth daily as needed. 04/27/17  Yes Auburn BilberryPatel, Shreyang, MD    Review of Systems  Constitutional: Positive for appetite change (decreased) and fatigue.  HENT: Positive for rhinorrhea and trouble swallowing. Negative for congestion.   Eyes: Negative.   Respiratory: Positive for cough and shortness of breath. Negative for chest tightness.   Cardiovascular: Negative for chest pain, palpitations and leg swelling.  Gastrointestinal: Positive for diarrhea (laxatives given this morning). Negative for abdominal distention and abdominal pain.  Endocrine: Negative.   Genitourinary: Negative.   Musculoskeletal: Negative for back pain and neck pain.  Skin: Negative.   Allergic/Immunologic: Negative.   Neurological: Positive for dizziness, tremors (right arm/head) and  weakness ("all over"). Negative for light-headedness.  Hematological: Negative for adenopathy. Bruises/bleeds easily.  Psychiatric/Behavioral: Positive for sleep disturbance (sleeping on 2-3 pillows; difficulty sleeping at times). Negative for dysphoric mood. The patient is not nervous/anxious.    Vitals:   05/11/17 0912  BP: (!) 151/95  Pulse: 76  Resp: 18  SpO2: 100%  Weight: 146 lb (66.2 kg)  Height: 5\' 7"  (1.702 m)   Wt Readings from Last 3 Encounters:  05/11/17 146 lb (66.2 kg)  04/27/17 154 lb (69.9 kg)  04/08/17 152 lb (68.9 kg)    Lab Results  Component Value Date   CREATININE 2.42 (H) 04/26/2017   CREATININE 2.27 (H) 04/24/2017   CREATININE 3.10 (H) 04/23/2017   Physical Exam  Constitutional: She is oriented to person, place, and time. She appears well-developed and well-nourished.  HENT:  Head: Normocephalic and atraumatic.  Neck: Normal range of motion. No JVD present.  Cardiovascular: Normal rate and regular rhythm.   Pulmonary/Chest: Effort normal. She has no wheezes. She has no rales.  Abdominal: Soft. She exhibits no distension. There is no tenderness.  Musculoskeletal: She exhibits no edema or tenderness.  Neurological: She is alert and oriented to person, place, and time. She displays tremor (shaking of right arm and head).  Skin: Skin is warm and dry.  Psychiatric: She has a normal mood and affect. Thought content normal.  Nursing note and vitals reviewed.   Assessment & Plan:  1: Chronic heart failure with preserved ejection fraction- - NYHA class III - euvolemic - being weighed daily at [facility]; reminded her to have them call for an overnight weight gain of >2 pounds or a weekly weight gain of >5 pounds - not adding salt and doesn't feel like the food she eats tastes salty - receiving PT at facility - saw cardiologist (End) 03/31/17 and returns to him 06/29/17  2: HTN- - BP mildly elevated today - continue medications - BMP from 04/26/17  reviewed and shows sodium 139, potassium 4.3 and GFR 18  - receiving dialysis M,W, F; unsure of duration at this time - hydralazine had been decreased to BID  3: Parkinson- - significant shaking of right arm and head  4: Atrial fibrillation- - metoprolol now 100mg  BID - also on amiodarone - remains off warfarin since last hospitalization; will send a message to Dr. Okey DupreEnd about whether he wants it resumed or not  Patient did not bring her medications nor a list. Each medication was verbally reviewed with the patient and she  was encouraged to bring the bottles to every visit to confirm accuracy of list.  Return here in 3 months or sooner for any questions/problems before then.

## 2017-05-13 ENCOUNTER — Telehealth: Payer: Self-pay | Admitting: *Deleted

## 2017-05-13 NOTE — Telephone Encounter (Signed)
Dr End sent message asking me to arrange for patient to come in for f/u appt with him or APP in the next week or two to readdress anticoagulation. No answer. Left message to call back to call back to schedule an appt. Routed to scheduling to help assist with this.

## 2017-05-13 NOTE — Telephone Encounter (Signed)
S/w patient's daughter [name], ok per DPR. Appt scheduled for 05/20/17 at 1100am for patient to see Dr End. She verbalized understanding of appt date and time.

## 2017-05-20 ENCOUNTER — Ambulatory Visit (INDEPENDENT_AMBULATORY_CARE_PROVIDER_SITE_OTHER): Payer: Medicare Other | Admitting: Internal Medicine

## 2017-05-20 ENCOUNTER — Encounter: Payer: Self-pay | Admitting: Internal Medicine

## 2017-05-20 VITALS — BP 120/78 | HR 60 | Ht 67.5 in | Wt 154.0 lb

## 2017-05-20 DIAGNOSIS — I824Z9 Acute embolism and thrombosis of unspecified deep veins of unspecified distal lower extremity: Secondary | ICD-10-CM

## 2017-05-20 DIAGNOSIS — I10 Essential (primary) hypertension: Secondary | ICD-10-CM | POA: Diagnosis not present

## 2017-05-20 DIAGNOSIS — R5381 Other malaise: Secondary | ICD-10-CM | POA: Diagnosis not present

## 2017-05-20 DIAGNOSIS — I481 Persistent atrial fibrillation: Secondary | ICD-10-CM

## 2017-05-20 DIAGNOSIS — I25118 Atherosclerotic heart disease of native coronary artery with other forms of angina pectoris: Secondary | ICD-10-CM

## 2017-05-20 DIAGNOSIS — I5042 Chronic combined systolic (congestive) and diastolic (congestive) heart failure: Secondary | ICD-10-CM

## 2017-05-20 DIAGNOSIS — I4819 Other persistent atrial fibrillation: Secondary | ICD-10-CM

## 2017-05-20 MED ORDER — ASPIRIN EC 81 MG PO TBEC: 81.0000 mg | DELAYED_RELEASE_TABLET | Freq: Every day | ORAL | 3 refills | Status: DC

## 2017-05-20 NOTE — Patient Instructions (Signed)
Medication Instructions:  Your physician has recommended you make the following change in your medication:  1- STOP Amiodarone. 2- START Aspirin 81 mg by mouth once a day.   Labwork: none  Testing/Procedures: nond  Follow-Up: Your physician recommends that you schedule a follow-up appointment in: 1 MONTH WITH CHRIS OR RYAN.   If you need a refill on your cardiac medications before your next appointment, please call your pharmacy.

## 2017-05-20 NOTE — Progress Notes (Signed)
Follow-up Outpatient Visit Date: 05/20/2017  Primary Care Provider: Darliss RidgelBergert, Jessica W, MD [address] [place] [address]  Chief Complaint: Shortness of breath  HPI:  Molly Wolf is a 76 y.o. year-old female with history of coronary artery disease status post remote PCI at [facility], chronic systolic and diastolic heart failure, persistent atrial fibrillation complicated by rectus sheath hematoma while on warfarin, chronic kidney disease recently started on hemodialysis, Parkinson's disease, diabetes mellitus, COPD, DVT, hypertension, hyperlipidemia, and anxiety, who presents for follow-up of shortness of breath. I last saw her on 03/31/17. She was subsequently admitted twice in late June, initially for heart failure exacerbation complicated by worsening renal function and atrial fibrillation. She was started on hemodialysis. She also developed a rectus sheath hematoma while in the hospital in the setting of a supratherapeutic INR. She was discharged to [facility] for rehabilitation, where she remains.  Today, Molly Wolf reports that her shortness of breath is gradually improving. She is able to walk with physical therapy, though she remains unsteady on her feet. She almost fell at dialysis yesterday, noting that her legs just gave out. She has also fallen at least once while at [facility]. She has not had any further bleeding but is currently not on any anticoagulation. She has not had any chest pain but reports an episode of bilateral shoulder pain, which has been an anginal equivalent for her in the past. This resolved promptly with a single dose of sublingual nitroglycerin. Molly Wolf denies palpitations, lightheadedness, and edema. She is hoping to leave [facility] early next week, though both she and her daughter are concerned about Ms. Steely's ability to perform her ADLs at  home.  --------------------------------------------------------------------------------------------------  Cardiovascular History & Procedures: Cardiovascular Problems:  Coronary artery disease  Chronic diastolic heart failure  Pulmonary hypertension  Persistent atrial fibrillation  Risk Factors:  Known coronary artery disease, diabetes mellitus, hypertension, hyperlipidemia, and age greater than 1165  Cath[facility]:  [facility] (details unknown)  CV Surgery:  None  EP Procedures and Devices:  14-day event monitor (02/10/17): Sinus rhythm with frequent PVCs, occasional PACs, and multiple atrial runs including sustained atrial rhythm. Periods is most suggestive of SVT, though atrial flutter and/or fibrillation cannot be excluded.  Non-Invasive Evaluation(s):  TTE (04/04/17): Moderate LVH. LVEF 35% with hypokinesis of the mid and apical anterior and anterolateral walls. Aortic sclerosis with mild regurgitation. Moderate MR. Moderate left atrial enlargement. Mild right atrial enlargement. Moderate to severe pulmonary hypertension. Small pericardial effusion.  TTE (01/26/17): Mild LVH with moderate focal basal hypertrophy of the septum. LVEF 45-50% with akinesis of the basal and mid inferolateral and inferior myocardium. Grade 2 diastolic dysfunction. Moderately thickened and calcified aortic valve with mild to moderate stenosis and mild regurgitation. Mitral annular calcification with mild to moderate regurgitation. Mild left atrial enlargement. Mildly dilated right ventricle with mildly reduced contraction. Mild right atrial enlargement. Moderate to severe pulmonary hypertension.  Recent CV Pertinent Labs: Lab Results  Component Value Date   CHOL 98 04/04/2017   HDL 37 (L) 04/04/2017   LDLCALC 49 04/04/2017   TRIG 60 04/04/2017   CHOLHDL 2.6 04/04/2017   INR 2.33 04/27/2017   BNP 980.0 (H) 04/05/2017   K 4.3 04/26/2017   MG 2.1 04/04/2017   BUN 36 (H) 04/26/2017    CREATININE 2.42 (H) 04/26/2017    Past medical and surgical history were reviewed and updated in EPIC.  Current Meds  Medication Sig  . acetaminophen (TYLENOL) 325 MG tablet  Take 650 mg by mouth every 6 (six) hours as needed for mild pain.  Marland Kitchen amiodarone (PACERONE) 200 MG tablet Take 1 tablet (200 mg total) by mouth 2 (two) times daily.  Marland Kitchen atorvastatin (LIPITOR) 20 MG tablet Take 20 mg by mouth daily.  . Carbidopa-Levodopa ER (SINEMET CR) 25-100 MG tablet controlled release Take 2 tablets by mouth 4 (four) times daily.  . Coenzyme Q10 (COQ10) 50 MG CAPS Take 50 mg by mouth daily.  Marland Kitchen CRANBERRY PO Take 1 tablet by mouth daily.  Marland Kitchen docusate sodium (COLACE) 100 MG capsule Take 1 capsule (100 mg total) by mouth 2 (two) times daily.  Marland Kitchen escitalopram (LEXAPRO) 20 MG tablet Take 20 mg by mouth daily.  Marland Kitchen gabapentin (NEURONTIN) 300 MG capsule Take 300 mg by mouth daily.   Marland Kitchen glimepiride (AMARYL) 1 MG tablet Take 1 mg by mouth daily with breakfast.  . hydrALAZINE (APRESOLINE) 10 MG tablet Take 1 tablet (10 mg total) by mouth 2 (two) times daily.  . insulin aspart (NOVOLOG) 100 UNIT/ML injection Inject 2 Units into the skin 3 (three) times daily with meals.  . insulin glargine (LANTUS) 100 UNIT/ML injection Inject 0.05 mLs (5 Units total) into the skin at bedtime.  . isosorbide mononitrate (IMDUR) 60 MG 24 hr tablet Take 1 tablet (60 mg total) by mouth daily.  Marland Kitchen loratadine (CLARITIN) 10 MG tablet Take 5 mg by mouth daily as needed for allergies.   Marland Kitchen LORazepam (ATIVAN) 1 MG tablet Take 1 tablet (1 mg total) by mouth 2 (two) times daily as needed for anxiety.  . metoprolol tartrate (LOPRESSOR) 100 MG tablet Take 1 tablet (100 mg total) by mouth 2 (two) times daily.  . mirabegron ER (MYRBETRIQ) 25 MG TB24 tablet Take 25 mg by mouth daily.  . nitroGLYCERIN (NITROSTAT) 0.4 MG SL tablet Place 0.4 mg under the tongue every 5 (five) minutes as needed for chest pain.  . Nutritional Supplements (FEEDING SUPPLEMENT,  NEPRO CARB STEADY,) LIQD Take 237 mLs by mouth 3 (three) times daily between meals.  . Omega-3 Fatty Acids (FISH OIL) 1000 MG CAPS Take 1,000 mg by mouth daily.  . polyethylene glycol (MIRALAX / GLYCOLAX) packet Take 17 g by mouth daily as needed for mild constipation.  . promethazine (PHENERGAN) 25 MG tablet Take 25 mg by mouth every 6 (six) hours as needed for nausea or vomiting.  . saccharomyces boulardii (FLORASTOR) 250 MG capsule Take 250 mg by mouth daily.  . traMADol (ULTRAM) 50 MG tablet Take 1 tablet (50 mg total) by mouth daily as needed.    Allergies: Patient has no known allergies.  Social History   Social History  . Marital status: Single    Spouse name: N/A  . Number of children: N/A  . Years of education: N/A   Occupational History  . Not on file.   Social History Main Topics  . Smoking status: Never Smoker  . Smokeless tobacco: Never Used  . Alcohol use No  . Drug use: No  . Sexual activity: No   Other Topics Concern  . Not on file   Social History Narrative  . No narrative on file    Family History  Problem Relation Age of Onset  . Cervical cancer Mother   . Kidney failure Father   . CAD Father   . CAD Brother   . Prostate cancer Neg Hx   . Kidney cancer Neg Hx   . Bladder Cancer Neg Hx     Review of  Systems: A 12-system review of systems was performed and was negative except as noted in the HPI.  --------------------------------------------------------------------------------------------------  Physical Exam: BP 120/78 (BP Location: Left Arm, Patient Position: Sitting, Cuff Size: Normal)   Pulse 60   Ht 5' 7.5" (1.715 m)   Wt 154 lb (69.9 kg) Comment: [facility] weight 05/19/2017  BMI 23.76 kg/m   General:  Frail woman with pronounced tremor, seated in a wheelchair. She is accompanied by her daughter. HEENT: No conjunctival pallor or scleral icterus. Moist mucous membranes.  OP clear. Neck: Supple without lymphadenopathy or thyromegaly.  JVP is approximately 8-10 cm with positive HJR. Lungs: Normal work of breathing. Faint bibasilar crackles.  Heart: Irregularly irregular with 2/6 systolic murmur loudest at the apex. Nondisplaced PMI. Abd: Bowel sounds present. Soft, NT/ND without hepatosplenomegaly. Previously seen rectus sheath hematoma is no longer palpable. The area of concern is not tender. Ext: Trace pretibial edema bilaterally. Radial, PT, and DP pulses are 2+ bilaterally. Skin: Warm and dry without rash.  EKG:  Atrial fibrillation with left axis deviation and left bundle branch block.  Lab Results  Component Value Date   WBC 5.5 04/27/2017   HGB 8.5 (L) 04/27/2017   HCT 26.6 (L) 04/27/2017   MCV 87.9 04/27/2017   PLT 169 04/27/2017    Lab Results  Component Value Date   NA 139 04/26/2017   K 4.3 04/26/2017   CL 100 (L) 04/26/2017   CO2 30 04/26/2017   BUN 36 (H) 04/26/2017   CREATININE 2.42 (H) 04/26/2017   GLUCOSE 206 (H) 04/26/2017   ALT <5 (L) 04/09/2017    Lab Results  Component Value Date   CHOL 98 04/04/2017   HDL 37 (L) 04/04/2017   LDLCALC 49 04/04/2017   TRIG 60 04/04/2017   CHOLHDL 2.6 04/04/2017    --------------------------------------------------------------------------------------------------  ASSESSMENT AND PLAN: Chronic systolic and diastolic heart failure Ms. Samet still appears volume overloaded on exam, with crackles at the lung bases, mildly elevated JVP, and mild leg edema. I will recheck after her hemodialysis center [facility]) to see if they can adjust her fluid removal. We will continue Ms. Fiala's current medications, including metoprolol. I am hesitant to add an ACE inhibitor or ARB at this time in the setting of her advanced kidney disease and recent initiation of dialysis. We will continue with hydralazine and isosorbide mononitrate.  Coronary artery disease with stable angina Ms. Karstens reports one episode of bilateral shoulder pain, which has been an  anginal equivalent for her in the past. Given her declining LVEF with anterior and anterolateral wall motion abnormality by echo, there is certainly concern for significant CAD. However, we have agreed to defer catheterization given her multiple other comorbidities. We have agreed to start low-dose aspirin and continue atorvastatin. We will continue current doses of metoprolol and. isosorbide mononitrate  Persistent atrial fibrillation Ms. Adan remains in atrial fibrillation with adequate if not somewhat slow heart rate control. She is asymptomatic. I remain concerned about starting back anticoagulation despite a CHADSVASC score of at least 7, due to her spontaneous rectus sheath hematoma the setting of a modestly supratherapeutic INR, as well as multiple falls in the last several months) including since hospital discharge last month). We have agreed to start low-dose aspirin. Since we will not pursue a rhythm control strategy given inability to anticoagulate Ms. Molly Dunn, we have agreed to discontinue amiodarone. We will continue her current dose of metoprolol.  Hypertension Blood pressure is well controlled today. No  medication changes at this time.  DVT Infrapopliteal DVT noted during hospitalization in April. Due to discontinuation of anticoagulation, IVC filter was placed last month. I will defer management to the patient's PCP and vascular surgery.  Deconditioning This is likely multifactorial, including several recent hospitalizations, worsening heart failure, and potential progression of her Parkinson's disease. I feel that Ms. Pagett may benefit from additional rehabilitation and have encouraged her to consider staying at [facility] longer if possible.  Follow-up: Return to clinic in one month.  Yvonne Kendall, MD 05/20/2017 11:12 AM

## 2017-05-21 ENCOUNTER — Encounter: Payer: Self-pay | Admitting: Internal Medicine

## 2017-05-21 DIAGNOSIS — I82409 Acute embolism and thrombosis of unspecified deep veins of unspecified lower extremity: Secondary | ICD-10-CM | POA: Insufficient documentation

## 2017-06-21 ENCOUNTER — Ambulatory Visit: Payer: Medicare Other | Admitting: Physician Assistant

## 2017-06-21 NOTE — Progress Notes (Deleted)
Cardiology Office Note Date:  06/21/2017  Patient ID:  Molly, Wolf 1941-09-28, MRN [id number] PCP:  Darliss Ridgel, MD  Cardiologist:  Dr. Okey Dupre, MD  ***refresh   Chief Complaint: Follow up  History of Present Illness: Molly Wolf is a 76 y.o. female with history of CAD s/p remote PCI at [facility], persistent atrial fibrillation diagnosed 03/31/17 on Coumadin, chronic combined systolic and diastolic HF complicated by pulmonary HTN, CKD stage IV, Parkinson's disease, DM, COPD, DVT in setting of R patellar fracture on Coumadin, HTN, HLD, and anxiety who presents for follow up of ***.    She has recently been admitted twice in June, initially for heart failure exacerbation complicated by worsening renal function and Afib. She was started on HD. She also developed a rectus sheath hematoma while in the hospital in the setting of a supratherapeutic INR. Coumadin was held and an IVC filter was placed. She was discharged to [facility] for rehabilitation.   She was most recently seen by Dr. Okey Dupre on 8/9 with gradually improving SOB. She continued to work with PT, though noted unsteadiness on her feet and was not on longterm anticoagulation at that time given the above falls, supratherapeutic INR and spontaneous rectus sheath hematoma. She continued to appear volume overloaded. Plan was to check with HD center to see if her fluid removal could be adjusted. Her bilateral shoulder pain was thought to possibly be her anginal equivalent, though ishcemic evaluation was deferred given her comorbidities.   ***   Cardiovascular History & Procedures: Cardiovascular Problems:  Coronary artery disease  Chronic diastolic heart failure  Pulmonary hypertension  Persistent atrial fibrillation  Risk Factors:  Known coronary artery disease, diabetes mellitus, hypertension, hyperlipidemia, and age greater than 47  Cath/PCI:  Remote PCI at [facility] (details unknown)  CV Surgery:  None  EP  Procedures and Devices:  14-day event monitor (02/10/17): Sinus rhythm with frequent PVCs, occasional PACs, and multiple atrial runs including sustained atrial rhythm. Periods is most suggestive of SVT, though atrial flutter and/or fibrillation cannot be excluded.  Non-Invasive Evaluation(s):  TTE (04/04/17): Moderate LVH. LVEF 35% with hypokinesis of the mid and apical anterior and anterolateral walls. Aortic sclerosis with mild regurgitation. Moderate MR. Moderate left atrial enlargement. Mild right atrial enlargement. Moderate to severe pulmonary hypertension. Small pericardial effusion.  TTE (01/26/17): Mild LVH with moderate focal basal hypertrophy of the septum. LVEF 45-50% with akinesis of the basal and mid inferolateral and inferior myocardium. Grade 2 diastolic dysfunction. Moderately thickened and calcified aortic valve with mild to moderate stenosis and mild regurgitation. Mitral annular calcification with mild to moderate regurgitation. Mild left atrial enlargement. Mildly dilated right ventricle with mildly reduced contraction. Mild right atrial enlargement. Moderate to severe pulmonary hypertension.   Past Medical History:  Diagnosis Date  . Anxiety   . Arthritis   . Chronic combined systolic and diastolic CHF (congestive heart failure) (HCC)    a. 01/2008 Echo ([facility]): EF > 55%, mod-sev LVH w/ grade 1 DD, biatrial enlargement, mild AS, trace MR/TR; b. EF 45-50%, GR2DD, mild to moderate aortic stenosis, mild aortic insufficieny, mild to moderate mitral regurgitation, mild tricuspid regurgitation, mild biatrial enlargement, and a small pericardial effusion  . CKD (chronic kidney disease), stage IV (HCC)   . Closed patellar sleeve fracture of right knee    a. 01/2017 - conservatively managed.  Marland Kitchen COPD (chronic obstructive pulmonary disease) (HCC)   . Coronary artery disease    a. s/p remote stenting of unknown vessel @  [facility].  . Depression   . Diabetes mellitus with complication (HCC)     . DVT (deep venous thrombosis) (HCC)    a. in the setting of right patellar fracture on Coumadin  . Hyperlipidemia   . Hypertension   . Parkinson disease [facility](HCC)     Past Surgical History:  Procedure Laterality Date  . BACK SURGERY    . CORONARY ANGIOPLASTY WITH STENT PLACEMENT    . DIALYSIS/PERMA CATHETER INSERTION N/A 04/22/2017   Procedure: Dialysis/Perma Catheter Insertion;  Surgeon: Annice Needyew, Jason S, MD;  Location: [facility] INVASIVE CV LAB;  Service: Cardiovascular;  Laterality: N/A;  . ESOPHAGOGASTRODUODENOSCOPY N/A 04/09/2017   Procedure: ESOPHAGOGASTRODUODENOSCOPY (EGD);  Surgeon: Wyline MoodAnna, Kiran, MD;  [facility] ENDOSCOPY;  Service: Endoscopy;  Laterality: N/A;  . IVC FILTER INSERTION N/A 04/23/2017   Procedure: IVC Filter Insertion;  Surgeon: Renford DillsSchnier, Gregory G, MD;  Location: [facility] INVASIVE CV LAB;  Service: Cardiovascular;  Laterality: N/A;  . NECK SURGERY      No outpatient prescriptions have been marked as taking for the 06/21/17 encounter (Appointment) with Sondra Bargesunn, Itha Kroeker M, PA-C.    Allergies:   Patient has no known allergies.   Social History:  The patient  reports that she has never smoked. She has never used smokeless tobacco. She reports that she does not drink alcohol or use drugs.   Family History:  The patient's family history includes CAD in her brother and father; Cervical cancer in her mother; Kidney failure in her father.  ROS:   ROS   PHYSICAL EXAM: *** VS:  There were no vitals taken for this visit. BMI: There is no height or weight on file to calculate BMI.  Physical Exam   EKG:  Was ordered and interpreted by me today. Shows ***  Recent Labs: 04/04/2017: Magnesium 2.1 04/05/2017: B Natriuretic Peptide 980.0 04/09/2017: ALT <5 04/10/2017: TSH 2.925 04/26/2017: BUN 36; Creatinine, Ser 2.42; Potassium 4.3; Sodium 139 04/27/2017: Hemoglobin 8.5; Platelets 169  04/04/2017: Cholesterol 98; HDL 37; LDL Cholesterol 49; Total CHOL/HDL Ratio 2.6; Triglycerides 60; VLDL 12    CrCl cannot be calculated (Patient's most recent lab result is older than the maximum 21 days allowed.).   Wt Readings from Last 3 Encounters:  05/20/17 154 lb (69.9 kg)  05/11/17 146 lb (66.2 kg)  04/27/17 154 lb (69.9 kg)     Other studies reviewed: Additional studies/records reviewed today include: summarized above  ASSESSMENT AND PLAN:  1. Chronic combined systolic and diastolic CHF: 2. CAD with stable angina: 3. Persistent Afib: 4. HTN: 5. DVT: 6. Deconditioning:   Disposition: F/u with *** in   Current medicines are reviewed at length with the patient today.  The patient did not have any concerns regarding medicines.  Elinor DodgeSigned, Vitor Overbaugh PA-C 06/21/2017 11:53 AM     [facility] [address] [place] [address] [phone]

## 2017-06-23 ENCOUNTER — Encounter: Payer: Self-pay | Admitting: Physician Assistant

## 2017-06-29 ENCOUNTER — Ambulatory Visit: Payer: Medicare Other | Admitting: Internal Medicine

## 2017-07-02 ENCOUNTER — Encounter: Payer: Self-pay | Admitting: Emergency Medicine

## 2017-07-02 ENCOUNTER — Inpatient Hospital Stay
Admission: EM | Admit: 2017-07-02 | Discharge: 2017-07-03 | DRG: 193 | Disposition: A | Discharge status: Home / Self Care | Payer: Medicare Other | Attending: Specialist | Admitting: Specialist

## 2017-07-02 ENCOUNTER — Emergency Department: Payer: Medicare Other

## 2017-07-02 DIAGNOSIS — I2510 Atherosclerotic heart disease of native coronary artery without angina pectoris: Secondary | ICD-10-CM | POA: Diagnosis not present

## 2017-07-02 DIAGNOSIS — R0902 Hypoxemia: Secondary | ICD-10-CM | POA: Diagnosis not present

## 2017-07-02 DIAGNOSIS — R0602 Shortness of breath: Secondary | ICD-10-CM

## 2017-07-02 DIAGNOSIS — E1122 Type 2 diabetes mellitus with diabetic chronic kidney disease: Secondary | ICD-10-CM | POA: Diagnosis not present

## 2017-07-02 DIAGNOSIS — I5042 Chronic combined systolic (congestive) and diastolic (congestive) heart failure: Secondary | ICD-10-CM | POA: Diagnosis not present

## 2017-07-02 DIAGNOSIS — Z8249 Family history of ischemic heart disease and other diseases of the circulatory system: Secondary | ICD-10-CM | POA: Diagnosis not present

## 2017-07-02 DIAGNOSIS — N186 End stage renal disease: Secondary | ICD-10-CM | POA: Diagnosis present

## 2017-07-02 DIAGNOSIS — Z841 Family history of disorders of kidney and ureter: Secondary | ICD-10-CM | POA: Diagnosis not present

## 2017-07-02 DIAGNOSIS — Z794 Long term (current) use of insulin: Secondary | ICD-10-CM

## 2017-07-02 DIAGNOSIS — Z955 Presence of coronary angioplasty implant and graft: Secondary | ICD-10-CM

## 2017-07-02 DIAGNOSIS — I132 Hypertensive heart and chronic kidney disease with heart failure and with stage 5 chronic kidney disease, or end stage renal disease: Secondary | ICD-10-CM | POA: Diagnosis present

## 2017-07-02 DIAGNOSIS — Z8049 Family history of malignant neoplasm of other genital organs: Secondary | ICD-10-CM

## 2017-07-02 DIAGNOSIS — G20 Parkinson's disease: Secondary | ICD-10-CM | POA: Diagnosis present

## 2017-07-02 DIAGNOSIS — E785 Hyperlipidemia, unspecified: Secondary | ICD-10-CM | POA: Diagnosis not present

## 2017-07-02 DIAGNOSIS — Z86718 Personal history of other venous thrombosis and embolism: Secondary | ICD-10-CM

## 2017-07-02 DIAGNOSIS — Z7982 Long term (current) use of aspirin: Secondary | ICD-10-CM

## 2017-07-02 DIAGNOSIS — F329 Major depressive disorder, single episode, unspecified: Secondary | ICD-10-CM | POA: Diagnosis not present

## 2017-07-02 DIAGNOSIS — J440 Chronic obstructive pulmonary disease with acute lower respiratory infection: Secondary | ICD-10-CM | POA: Diagnosis present

## 2017-07-02 DIAGNOSIS — J189 Pneumonia, unspecified organism: Secondary | ICD-10-CM | POA: Diagnosis not present

## 2017-07-02 DIAGNOSIS — Z992 Dependence on renal dialysis: Secondary | ICD-10-CM

## 2017-07-02 DIAGNOSIS — I4891 Unspecified atrial fibrillation: Secondary | ICD-10-CM

## 2017-07-02 LAB — CBC WITH DIFFERENTIAL/PLATELET
Basophils Absolute: 0.1 10*3/uL (ref 0–0.1)
Basophils Relative: 1 %
Eosinophils Absolute: 0 10*3/uL (ref 0–0.7)
Eosinophils Relative: 1 %
HCT: 41 % (ref 35.0–47.0)
HEMOGLOBIN: 13.6 g/dL (ref 12.0–16.0)
LYMPHS ABS: 0.6 10*3/uL — AB (ref 1.0–3.6)
Lymphocytes Relative: 11 %
MCH: 31.1 pg (ref 26.0–34.0)
MCHC: 33.1 g/dL (ref 32.0–36.0)
MCV: 93.8 fL (ref 80.0–100.0)
MONOS PCT: 7 %
Monocytes Absolute: 0.4 10*3/uL (ref 0.2–0.9)
NEUTROS ABS: 4.3 10*3/uL (ref 1.4–6.5)
NEUTROS PCT: 80 %
Platelets: 112 10*3/uL — ABNORMAL LOW (ref 150–440)
RBC: 4.38 MIL/uL (ref 3.80–5.20)
RDW: 17.3 % — ABNORMAL HIGH (ref 11.5–14.5)
WBC: 5.3 10*3/uL (ref 3.6–11.0)

## 2017-07-02 LAB — BASIC METABOLIC PANEL
Anion gap: 11 (ref 5–15)
BUN: 16 mg/dL (ref 6–20)
CHLORIDE: 99 mmol/L — AB (ref 101–111)
CO2: 28 mmol/L (ref 22–32)
CREATININE: 1.71 mg/dL — AB (ref 0.44–1.00)
Calcium: 9.6 mg/dL (ref 8.9–10.3)
GFR calc non Af Amer: 28 mL/min — ABNORMAL LOW (ref >60)
GFR, EST AFRICAN AMERICAN: 32 mL/min — AB (ref >60)
Glucose, Bld: 187 mg/dL — ABNORMAL HIGH (ref 65–99)
POTASSIUM: 3.6 mmol/L (ref 3.5–5.1)
SODIUM: 138 mmol/L (ref 135–145)

## 2017-07-02 LAB — TROPONIN I

## 2017-07-02 LAB — GLUCOSE, CAPILLARY: GLUCOSE-CAPILLARY: 137 mg/dL — AB (ref 65–99)

## 2017-07-02 MED ORDER — LORAZEPAM 0.5 MG PO TABS
0.5000 mg | ORAL_TABLET | Freq: Two times a day (BID) | ORAL | Status: DC
  Administered 2017-07-02: 0.5 mg via ORAL
  Filled 2017-07-02: qty 1

## 2017-07-02 MED ORDER — METOPROLOL TARTRATE 5 MG/5ML IV SOLN
5.0000 mg | INTRAVENOUS | Status: DC | PRN
  Administered 2017-07-02: 5 mg via INTRAVENOUS
  Filled 2017-07-02: qty 5

## 2017-07-02 MED ORDER — ATORVASTATIN CALCIUM 20 MG PO TABS
20.0000 mg | ORAL_TABLET | Freq: Every day | ORAL | Status: DC
  Administered 2017-07-02: 20 mg via ORAL
  Filled 2017-07-02: qty 1

## 2017-07-02 MED ORDER — GLIMEPIRIDE 1 MG PO TABS
1.0000 mg | ORAL_TABLET | Freq: Every day | ORAL | Status: DC
  Filled 2017-07-02: qty 1

## 2017-07-02 MED ORDER — NEPRO/CARBSTEADY PO LIQD: 237.0000 mL | Freq: Three times a day (TID) | ORAL | Status: DC

## 2017-07-02 MED ORDER — DOXEPIN HCL 10 MG PO CAPS
10.0000 mg | ORAL_CAPSULE | Freq: Every day | ORAL | Status: DC
  Administered 2017-07-02: 10 mg via ORAL
  Filled 2017-07-02 (×2): qty 1

## 2017-07-02 MED ORDER — LEVOFLOXACIN IN D5W 750 MG/150ML IV SOLN
750.0000 mg | Freq: Once | INTRAVENOUS | Status: AC
  Administered 2017-07-02: 750 mg via INTRAVENOUS
  Filled 2017-07-02: qty 150

## 2017-07-02 MED ORDER — OMEGA-3-ACID ETHYL ESTERS 1 G PO CAPS
1.0000 g | ORAL_CAPSULE | Freq: Two times a day (BID) | ORAL | Status: DC
  Administered 2017-07-02: 1 g via ORAL
  Filled 2017-07-02: qty 1

## 2017-07-02 MED ORDER — DOCUSATE SODIUM 100 MG PO CAPS: 100.0000 mg | ORAL_CAPSULE | Freq: Two times a day (BID) | ORAL | Status: DC | PRN

## 2017-07-02 MED ORDER — ASPIRIN EC 81 MG PO TBEC: 81.0000 mg | DELAYED_RELEASE_TABLET | ORAL | Status: DC

## 2017-07-02 MED ORDER — POLYETHYLENE GLYCOL 3350 17 G PO PACK: 17.0000 g | PACK | Freq: Every day | ORAL | Status: DC | PRN

## 2017-07-02 MED ORDER — MIRABEGRON ER 25 MG PO TB24
25.0000 mg | ORAL_TABLET | Freq: Every day | ORAL | Status: DC
  Filled 2017-07-02: qty 1

## 2017-07-02 MED ORDER — LEVOFLOXACIN IN D5W 750 MG/150ML IV SOLN: 750.0000 mg | INTRAVENOUS | Status: DC

## 2017-07-02 MED ORDER — ISOSORBIDE MONONITRATE ER 30 MG PO TB24
60.0000 mg | ORAL_TABLET | Freq: Every day | ORAL | Status: DC
  Administered 2017-07-03: 60 mg via ORAL
  Filled 2017-07-02: qty 2

## 2017-07-02 MED ORDER — ACETAMINOPHEN 325 MG PO TABS: 650.0000 mg | ORAL_TABLET | Freq: Four times a day (QID) | ORAL | Status: DC | PRN

## 2017-07-02 MED ORDER — SACCHAROMYCES BOULARDII 250 MG PO CAPS
250.0000 mg | ORAL_CAPSULE | Freq: Every day | ORAL | Status: DC
Start: 2017-07-03 — End: 2017-07-03
  Filled 2017-07-02: qty 1

## 2017-07-02 MED ORDER — PROMETHAZINE HCL 25 MG PO TABS
25.0000 mg | ORAL_TABLET | Freq: Four times a day (QID) | ORAL | Status: DC | PRN
  Filled 2017-07-02: qty 1

## 2017-07-02 MED ORDER — ESCITALOPRAM OXALATE 20 MG PO TABS
20.0000 mg | ORAL_TABLET | Freq: Every day | ORAL | Status: DC
  Filled 2017-07-02: qty 1

## 2017-07-02 MED ORDER — HYDRALAZINE HCL 10 MG PO TABS
10.0000 mg | ORAL_TABLET | Freq: Two times a day (BID) | ORAL | Status: DC
  Administered 2017-07-03: 10 mg via ORAL
  Filled 2017-07-02 (×2): qty 1

## 2017-07-02 MED ORDER — CRANBERRY 450 MG PO TABS: 450.0000 mg | ORAL_TABLET | Freq: Every day | ORAL | Status: DC

## 2017-07-02 MED ORDER — NITROGLYCERIN 0.4 MG SL SUBL: 0.4000 mg | SUBLINGUAL_TABLET | SUBLINGUAL | Status: DC | PRN

## 2017-07-02 MED ORDER — COQ10 50 MG PO CAPS: 50.0000 mg | ORAL_CAPSULE | Freq: Every day | ORAL | Status: DC

## 2017-07-02 MED ORDER — METOPROLOL TARTRATE 50 MG PO TABS
100.0000 mg | ORAL_TABLET | Freq: Two times a day (BID) | ORAL | Status: DC
  Administered 2017-07-02 – 2017-07-03 (×2): 100 mg via ORAL
  Filled 2017-07-02 (×2): qty 2

## 2017-07-02 MED ORDER — CARBIDOPA-LEVODOPA ER 25-100 MG PO TBCR
2.0000 | EXTENDED_RELEASE_TABLET | Freq: Four times a day (QID) | ORAL | Status: DC
  Administered 2017-07-02 – 2017-07-03 (×2): 2 via ORAL
  Filled 2017-07-02 (×6): qty 2

## 2017-07-02 MED ORDER — GABAPENTIN 300 MG PO CAPS
300.0000 mg | ORAL_CAPSULE | Freq: Two times a day (BID) | ORAL | Status: DC
  Administered 2017-07-02 – 2017-07-03 (×2): 300 mg via ORAL
  Filled 2017-07-02 (×2): qty 1

## 2017-07-02 MED ORDER — ENOXAPARIN SODIUM 30 MG/0.3ML ~~LOC~~ SOLN
30.0000 mg | SUBCUTANEOUS | Status: DC
  Administered 2017-07-02: 30 mg via SUBCUTANEOUS
  Filled 2017-07-02: qty 0.3

## 2017-07-02 MED ORDER — INSULIN ASPART 100 UNIT/ML ~~LOC~~ SOLN: 0.0000 [IU] | Freq: Three times a day (TID) | SUBCUTANEOUS | Status: DC

## 2017-07-02 MED ORDER — TRAMADOL HCL 50 MG PO TABS
50.0000 mg | ORAL_TABLET | Freq: Every day | ORAL | Status: DC | PRN
  Administered 2017-07-02 – 2017-07-03 (×2): 50 mg via ORAL
  Filled 2017-07-02 (×2): qty 1

## 2017-07-02 MED ORDER — LORATADINE 10 MG PO TABS: 5.0000 mg | ORAL_TABLET | Freq: Every day | ORAL | Status: DC | PRN

## 2017-07-02 MED ORDER — INSULIN ASPART 100 UNIT/ML ~~LOC~~ SOLN: 0.0000 [IU] | Freq: Every day | SUBCUTANEOUS | Status: DC

## 2017-07-02 NOTE — Progress Notes (Signed)
PHARMACIST - PHYSICIAN ORDER COMMUNICATION  CONCERNING: P&T Medication Policy on Herbal Medications  DESCRIPTION:  This patient's order for:  Cranberry, Co Q-10  has been noted.  This product(s) is classified as an "herbal" or natural product. Due to a lack of definitive safety studies or FDA approval, nonstandard manufacturing practices, plus the potential risk of unknown drug-drug interactions while on inpatient medications, the Pharmacy and Therapeutics Committee does not permit the use of "herbal" or natural products of this type within [place].   ACTION TAKEN: The pharmacy department is unable to verify this order at this time and your patient has been informed of this safety policy. Please reevaluate patient's clinical condition at discharge and address if the herbal or natural product(s) should be resumed at that time.   

## 2017-07-02 NOTE — H&P (Signed)
[facility] [place] [facility]   PATIENT NAME: Molly Wolf    MR#:  [id number]  DATE OF BIRTH:  07/09/1941  DATE OF ADMISSION:  07/02/2017  PRIMARY CARE PHYSICIAN: Lauro Regulus, MD   REQUESTING/REFERRING PHYSICIAN: Derrill Kay  CHIEF COMPLAINT:  Shortness of breath  HISTORY OF PRESENT ILLNESS:  Molly Wolf  is a 76 y.o. female with a known history of End-stage renal disease on hemodialysis on Monday, Wednesday and Friday missed her dialysis today as she was short of breath has chronic combined systolic and diastolic congestive heart failure, Parkinson's disease, atrial fibrillation not on any anti-coag lesion other than aspirin is presenting to the ED with a chief complaint of shortness of breath for 1 week. Associated with cough. Chest x-ray has revealed pneumonia. Patient was given IV levofloxacin and hospitalist team is called to admit the patient.  PAST MEDICAL HISTORY:   Past Medical History:  Diagnosis Date  . Anxiety   . Arthritis   . Chronic combined systolic and diastolic CHF (congestive heart failure) (HCC)    a. 01/2008 Echo ([facility]): EF > 55%, mod-sev LVH w/ grade 1 DD, biatrial enlargement, mild AS, trace MR/TR; b. EF 45-50%, GR2DD, mild to moderate aortic stenosis, mild aortic insufficieny, mild to moderate mitral regurgitation, mild tricuspid regurgitation, mild biatrial enlargement, and a small pericardial effusion  . CKD (chronic kidney disease), stage IV (HCC)   . Closed patellar sleeve fracture of right knee    a. 01/2017 - conservatively managed.  Marland Kitchen COPD (chronic obstructive pulmonary disease) (HCC)   . Coronary artery disease    a. s/p remote stenting of unknown vessel @ [facility].  . Depression   . Diabetes mellitus with complication (HCC)   . DVT (deep venous thrombosis) (HCC)    a. in the setting of right patellar fracture on Coumadin  . Hyperlipidemia   . Hypertension   . Parkinson disease (HCC)     PAST SURGICAL HISTOIRY:    Past Surgical History:  Procedure Laterality Date  . BACK SURGERY    . CORONARY ANGIOPLASTY WITH STENT PLACEMENT    . DIALYSIS/PERMA CATHETER INSERTION N/A 04/22/2017   Procedure: Dialysis/Perma Catheter Insertion;  Surgeon: Annice Needy, MD;  Location: [facility] INVASIVE CV LAB;  Service: Cardiovascular;  Laterality: N/A;  . ESOPHAGOGASTRODUODENOSCOPY N/A 04/09/2017   Procedure: ESOPHAGOGASTRODUODENOSCOPY (EGD);  Surgeon: Wyline Mood, MD;  Location: [facility] ENDOSCOPY;  Service: Endoscopy;  Laterality: N/A;  . IVC FILTER INSERTION N/A 04/23/2017   Procedure: IVC Filter Insertion;  Surgeon: Renford Dills, MD;  Location: [facility] INVASIVE CV LAB;  Service: Cardiovascular;  Laterality: N/A;  . NECK SURGERY      SOCIAL HISTORY:   Social History  Substance Use Topics  . Smoking status: Never Smoker  . Smokeless tobacco: Never Used  . Alcohol use No    FAMILY HISTORY:   Family History  Problem Relation Age of Onset  . Cervical cancer Mother   . Kidney failure Father   . CAD Father   . CAD Brother   . Prostate cancer Neg Hx   . Kidney cancer Neg Hx   . Bladder Cancer Neg Hx     DRUG ALLERGIES:  No Known Allergies  REVIEW OF SYSTEMS:  CONSTITUTIONAL: No fever, fatigue or weakness.  EYES: No blurred or double vision.  EARS, NOSE, AND THROAT: No tinnitus or ear pain.  RESPIRATORY:Reporting intermittent episodes of cough, shortness of breath for 1 week, denies wheezing or hemoptysis.  CARDIOVASCULAR: No  chest pain, orthopnea, edema.  GASTROINTESTINAL: No nausea, vomiting, diarrhea or abdominal pain.  GENITOURINARY: No dysuria, hematuria.  ENDOCRINE: No polyuria, nocturia,  HEMATOLOGY: No anemia, easy bruising or bleeding SKIN: No rash or lesion. MUSCULOSKELETAL: No joint pain or arthritis.   NEUROLOGIC: No tingling, numbness, weakness.  PSYCHIATRY: No anxiety or depression.   MEDICATIONS AT HOME:   Prior to Admission medications   Medication Sig Start Date End Date Taking?  Authorizing Provider  acetaminophen (TYLENOL) 325 MG tablet Take 650 mg by mouth every 6 (six) hours as needed for mild pain.   Yes [provider]  aspirin EC 81 MG tablet Take 1 tablet (81 mg total) by mouth daily. Patient taking differently: Take 81 mg by mouth every other day.  05/20/17  Yes End, Cristal Deer, MD  atorvastatin (LIPITOR) 20 MG tablet Take 20 mg by mouth at bedtime.    Yes [provider]  Carbidopa-Levodopa ER (SINEMET CR) 25-100 MG tablet controlled release Take 2 tablets by mouth 4 (four) times daily. Patient taking differently: Take 2 tablets by mouth 5 (five) times daily.  03/05/17  Yes Hackney, Tina A, FNP  Coenzyme Q10 (COQ10) 50 MG CAPS Take 50 mg by mouth daily.   Yes [provider]  Cranberry 450 MG TABS Take 450 mg by mouth daily.   Yes [provider]  docusate sodium (COLACE) 100 MG capsule Take 1 capsule (100 mg total) by mouth 2 (two) times daily. Patient taking differently: Take 100 mg by mouth 2 (two) times daily as needed for moderate constipation.  04/27/17  Yes Auburn Bilberry, MD  doxepin (SINEQUAN) 10 MG capsule Take 10 mg by mouth at bedtime.   Yes [provider]  escitalopram (LEXAPRO) 20 MG tablet Take 20 mg by mouth daily.   Yes [provider]  gabapentin (NEURONTIN) 300 MG capsule Take 300 mg by mouth 2 (two) times daily.    Yes [provider]  glimepiride (AMARYL) 1 MG tablet Take 1 mg by mouth daily.    Yes [provider]  hydrALAZINE (APRESOLINE) 10 MG tablet Take 1 tablet (10 mg total) by mouth 2 (two) times daily. 04/27/17  Yes Auburn Bilberry, MD  insulin detemir (LEVEMIR) 100 UNIT/ML injection Inject 7 Units into the skin at bedtime.   Yes [provider]  isosorbide mononitrate (IMDUR) 60 MG 24 hr tablet Take 1 tablet (60 mg total) by mouth daily. 03/05/17  Yes Hackney, Inetta Fermo A, FNP  loratadine (CLARITIN) 10 MG tablet Take 5 mg by mouth daily as needed for allergies.     Yes [provider]  LORazepam (ATIVAN) 0.5 MG tablet Take 0.5 mg by mouth every 12 (twelve) hours. And as needed for anxiety/pain   Yes [provider]  metoprolol tartrate (LOPRESSOR) 100 MG tablet Take 1 tablet (100 mg total) by mouth 2 (two) times daily. 04/27/17  Yes Auburn Bilberry, MD  mirabegron ER (MYRBETRIQ) 25 MG TB24 tablet Take 25 mg by mouth daily.   Yes [provider]  nitroGLYCERIN (NITROSTAT) 0.4 MG SL tablet Place 0.4 mg under the tongue every 5 (five) minutes as needed for chest pain.   Yes [provider]  Nutritional Supplements (FEEDING SUPPLEMENT, NEPRO CARB STEADY,) LIQD Take 237 mLs by mouth 3 (three) times daily between meals. 04/27/17  Yes Auburn Bilberry, MD  Omega-3 Fatty Acids (FISH OIL) 1000 MG CAPS Take 1,000 mg by mouth daily.   Yes [provider]  polyethylene glycol (MIRALAX / GLYCOLAX) packet  Take 17 g by mouth daily as needed for mild constipation.   Yes [provider]  promethazine (PHENERGAN) 25 MG tablet Take 25 mg by mouth every 6 (six) hours as needed for nausea or vomiting.   Yes [provider]  saccharomyces boulardii (FLORASTOR) 250 MG capsule Take 250 mg by mouth daily.   Yes [provider]  traMADol (ULTRAM) 50 MG tablet Take 1 tablet (50 mg total) by mouth daily as needed. Patient taking differently: Take 50 mg by mouth daily as needed for moderate pain.  04/27/17  Yes Auburn Bilberry, MD      VITAL SIGNS:  Blood pressure (!) 159/133, pulse (!) 110, temperature 97.8 F (36.6 C), temperature source Oral, resp. rate (!) 21, height  (1.651 m), weight 70.3 kg (155 lb), SpO2 99 %.  PHYSICAL EXAMINATION:  GENERAL:  76 y.o.-year-old patient lying in the bed with no acute distress.  EYES: Pupils equal, round, reactive to light and accommodation. No scleral icterus. Extraocular muscles intact.  HEENT: Head atraumatic, normocephalic. Oropharynx and nasopharynx clear.  NECK:   Supple, no jugular venous distention. No thyroid enlargement, no tenderness.  LUNGS:  breath  moderatesounds bilaterally, no wheezing, bilateral basilar  rales,rhonchi or crepitation. No use of accessory muscles of respiration.  CARDIOVASCULAR: S1, S2 normal. No murmurs, rubs, or gallops.  ABDOMEN: Soft, nontender, nondistended. Bowel sounds present. No organomegaly or mass.  EXTREMITIES: No pedal edema, cyanosis, or clubbing.  NEUROLOGIC: Cranial nerves II through XII are intact. Muscle strength 5/5 in all extremities. Sensation intact. Gait not checked.  PSYCHIATRIC: The patient is alert and oriented x 3.  SKIN: No obvious rash, lesion, or ulcer.   LABORATORY PANEL:   CBC  Recent Labs Lab 07/02/17 1336  WBC 5.3  HGB 13.6  HCT 41.0  PLT 112*   ------------------------------------------------------------------------------------------------------------------  Chemistries   Recent Labs Lab 07/02/17 1336  NA 138  K 3.6  CL 99*  CO2 28  GLUCOSE 187*  BUN 16  CREATININE 1.71*  CALCIUM 9.6   ------------------------------------------------------------------------------------------------------------------  Cardiac Enzymes  Recent Labs Lab 07/02/17 1336  TROPONINI <0.03   ------------------------------------------------------------------------------------------------------------------  RADIOLOGY:  Dg Chest 2 View  Result Date: 07/02/2017 CLINICAL DATA:  Shortness of breath.  Dialysis patient. EXAM: CHEST  2 VIEW COMPARISON:  04/11/2017. FINDINGS: Small amount of patchy opacity at the left lung base, with significant improvement. Small bilateral pleural effusions. Interval right jugular catheter with its tip in the superior vena cava. No pneumothorax. Stable enlarged cardiac silhouette and mildly prominent interstitial markings. Normal pulmonary vascularity. Thoracic spine degenerative changes. Old, healed bilateral rib fractures. IMPRESSION: 1. Small bilateral pleural  effusions, with improvement. 2. Small amount of patchy atelectasis or pneumonia at the left lung base with improvement. 3. Stable cardiomegaly and chronic interstitial lung disease. Electronically Signed   By: Beckie Salts M.D.   On: 07/02/2017 14:25    EKG:   Orders placed or performed during the hospital encounter of 07/02/17  . EKG 12-Lead  . EKG 12-Lead    IMPRESSION AND PLAN:   Molly Wolf  is a 76 y.o. female with a known history of End-stage renal disease on hemodialysis on Monday, Wednesday and Friday missed her dialysis today as she was short of breath has chronic combined systolic and diastolic congestive heart failure, Parkinson's disease, atrial fibrillation not on any anti-coag lesion other than aspirin is presenting to the ED with a chief complaint of shortness of breath for 1 week. Associated with cough. Chest x-ray  has revealed pneumonia  #Shortness of breath from pneumonia community-acquired Admitted to MedSurg unit We'll get sputum culture and sensitivity if patient can't provide a sample IV levofloxacin Urine for Legionella antigen and streptococcal antibody Oxygen and breathing treatments as needed  #Atrial fibrillation with RVR Cardiac monitoring Metoprolol for rate control which is her home medication will be continued Baby aspirin  #End stage renal disease on hemodialysis Patient that he would [facility] Monday, Wednesday and Friday but missed her dialysis today. Discussed with nephrology Dr. Cherylann Ratel who will do dialysis tomorrow. Currently patient is not fluid overloaded   #insulin requiring diabetes mellitus Patient has been not taking her Lantus for the past one week Will hold off on the Lantus provide sliding scale insulin and check hemoglobin A1c   #Parkinson's disease chronic Continue Sinemet  DVT prophylaxis with Lovenox     All the records are reviewed and case discussed with ED provider. Management plans discussed with the patient, family and  they are in agreement.  CODE STATUS: fc , daughter. Lupita Leash is the healthcare power of attorney  TOTAL TIME TAKING CARE OF THIS PATIENT: 45 minutes.   Note: This dictation was prepared with Dragon dictation along with smaller phrase technology. Any transcriptional errors that result from this process are unintentional.  Ramonita Lab M.D on 07/02/2017 at 5:45 PM  Between 7am to 6pm - Pager - [phone]  After 6pm go to [email or web address] - password [email or web address] [facility]  [place]  [facility]  Office  [phone]  CC: Primary care physician; Lauro Regulus, MD

## 2017-07-02 NOTE — ED Triage Notes (Signed)
Patient presents to ED via POV from home with c/o SOB. Patient is a dialysis patient, MWF. Did not receive treatment today. Patient able to speak full complete sentences at this time. Hx of a fib. HR 122 at this time. Dr. Derrill Kay at bedside.

## 2017-07-02 NOTE — ED Provider Notes (Signed)
[facility] Emergency Department Provider Note  ____________________________________________   I have reviewed the triage vital signs and the nursing notes.   HISTORY  Chief Complaint Shortness of Breath   History limited by: Not Limited   HPI Molly Wolf is a 76 y.o. female who presents to the emergency department today because of concerns for shortness breath. Patient states that she has not been feeling well for roughly the past week. This has resolved and her missing some of her dialysis sessions including today's. Patient however did get to dialysis both Monday and Wednesday this week. She had her normal course. She states today the shortness of breath was worse. It is worse with movement. She denies any chest pain. She denies any fevers. She denies any swelling.   Past Medical History:  Diagnosis Date  . Anxiety   . Arthritis   . Chronic combined systolic and diastolic CHF (congestive heart failure) (HCC)    a. 01/2008 Echo ([facility]): EF > 55%, mod-sev LVH w/ grade 1 DD, biatrial enlargement, mild AS, trace MR/TR; b. EF 45-50%, GR2DD, mild to moderate aortic stenosis, mild aortic insufficieny, mild to moderate mitral regurgitation, mild tricuspid regurgitation, mild biatrial enlargement, and a small pericardial effusion  . CKD (chronic kidney disease), stage IV (HCC)   . Closed patellar sleeve fracture of right knee    a. 01/2017 - conservatively managed.  Marland Kitchen COPD (chronic obstructive pulmonary disease) (HCC)   . Coronary artery disease    a. s/p remote stenting of unknown vessel @ [facility].  . Depression   . Diabetes mellitus with complication (HCC)   . DVT (deep venous thrombosis) (HCC)    a. in the setting of right patellar fracture on Coumadin  . Hyperlipidemia   . Hypertension   . Parkinson disease [facility])     Patient Active Problem List   Diagnosis Date Noted  . DVT (deep venous thrombosis) (HCC) 05/21/2017  . Confusion   . Goals of care,  counseling/discussion   . Acute renal failure with acute tubular necrosis superimposed on stage 4 chronic kidney disease (HCC)   . Palliative care encounter   . Coronary artery disease of native artery of native heart with stable angina pectoris (HCC) 03/31/2017  . Persistent atrial fibrillation (HCC) 03/31/2017  . Chronic combined systolic and diastolic heart failure (HCC) 02/26/2017  . HTN (hypertension) 02/26/2017  . Parkinson disease (HCC) 02/26/2017  . Intractable pain 12/27/2016    Past Surgical History:  Procedure Laterality Date  . BACK SURGERY    . CORONARY ANGIOPLASTY WITH STENT PLACEMENT    . DIALYSIS/PERMA CATHETER INSERTION N/A 04/22/2017   Procedure: Dialysis/Perma Catheter Insertion;  Surgeon: Annice Needy, MD;  Location: [facility] INVASIVE CV LAB;  Service: Cardiovascular;  Laterality: N/A;  . ESOPHAGOGASTRODUODENOSCOPY N/A 04/09/2017   Procedure: ESOPHAGOGASTRODUODENOSCOPY (EGD);  Surgeon: Wyline Mood, MD;  Location: [facility] ENDOSCOPY;  Service: Endoscopy;  Laterality: N/A;  . IVC FILTER INSERTION N/A 04/23/2017   Procedure: IVC Filter Insertion;  Surgeon: Renford Dills, MD;  Location: [facility] INVASIVE CV LAB;  Service: Cardiovascular;  Laterality: N/A;  . NECK SURGERY      Prior to Admission medications   Medication Sig Start Date End Date Taking? Authorizing Provider  acetaminophen (TYLENOL) 325 MG tablet Take 650 mg by mouth every 6 (six) hours as needed for mild pain.    [provider]  aspirin EC 81 MG tablet Take 1 tablet (81 mg total) by mouth daily. 05/20/17   Yvonne Kendall, MD  atorvastatin (LIPITOR) 20 MG tablet Take 20 mg by mouth daily.    [provider]  Carbidopa-Levodopa ER (SINEMET CR) 25-100 MG tablet controlled release Take 2 tablets by mouth 4 (four) times daily. 03/05/17   Clarisa Kindred A, FNP  Coenzyme Q10 (COQ10) 50 MG CAPS Take 50 mg by mouth daily.    [provider]  CRANBERRY PO Take 1 tablet by mouth daily.    [provider]  docusate sodium (COLACE) 100 MG capsule Take 1 capsule (100 mg total) by mouth 2 (two) times daily. 04/27/17   Auburn Bilberry, MD  escitalopram (LEXAPRO) 20 MG tablet Take 20 mg by mouth daily.    [provider]  gabapentin (NEURONTIN) 300 MG capsule Take 300 mg by mouth daily.     [provider]  glimepiride (AMARYL) 1 MG tablet Take 1 mg by mouth daily with breakfast.    [provider]  hydrALAZINE (APRESOLINE) 10 MG tablet Take 1 tablet (10 mg total) by mouth 2 (two) times daily. 04/27/17   Auburn Bilberry, MD  insulin aspart (NOVOLOG) 100 UNIT/ML injection Inject 2 Units into the skin 3 (three) times daily with meals. 04/27/17   Auburn Bilberry, MD  insulin glargine (LANTUS) 100 UNIT/ML injection Inject 0.05 mLs (5 Units total) into the skin at bedtime. 04/27/17   Auburn Bilberry, MD  isosorbide mononitrate (IMDUR) 60 MG 24 hr tablet Take 1 tablet (60 mg total) by mouth daily. 03/05/17   Delma Freeze, FNP  loratadine (CLARITIN) 10 MG tablet Take 5 mg by mouth daily as needed for allergies.     [provider]  LORazepam (ATIVAN) 1 MG tablet Take 1 tablet (1 mg total) by mouth 2 (two) times daily as needed for anxiety. 04/27/17   Auburn Bilberry, MD  metoprolol tartrate (LOPRESSOR) 100 MG tablet Take 1 tablet (100 mg total) by mouth 2 (two) times daily. 04/27/17   Auburn Bilberry, MD  mirabegron ER (MYRBETRIQ) 25 MG TB24 tablet Take 25 mg by mouth daily.    [provider]  nitroGLYCERIN (NITROSTAT) 0.4 MG SL tablet Place 0.4 mg under the tongue every 5 (five) minutes as needed for chest pain.    [provider]  Nutritional Supplements (FEEDING SUPPLEMENT, NEPRO CARB STEADY,) LIQD Take 237 mLs by mouth 3 (three) times daily between meals. 04/27/17   Auburn Bilberry, MD  Omega-3 Fatty Acids (FISH OIL) 1000 MG CAPS Take 1,000 mg by mouth daily.    [provider]  polyethylene glycol (MIRALAX / GLYCOLAX) packet Take 17 g  by mouth daily as needed for mild constipation.    [provider]  promethazine (PHENERGAN) 25 MG tablet Take 25 mg by mouth every 6 (six) hours as needed for nausea or vomiting.    [provider]  saccharomyces boulardii (FLORASTOR) 250 MG capsule Take 250 mg by mouth daily.    [provider]  traMADol (ULTRAM) 50 MG tablet Take 1 tablet (50 mg total) by mouth daily as needed. 04/27/17   Auburn Bilberry, MD    Allergies Patient has no known allergies.  Family History  Problem Relation Age of Onset  . Cervical cancer Mother   . Kidney failure Father   . CAD Father   . CAD Brother   . Prostate cancer Neg Hx   . Kidney cancer Neg Hx   . Bladder Cancer Neg Hx     Social History Social History  Substance Use Topics  . Smoking status: Never  Smoker  . Smokeless tobacco: Never Used  . Alcohol use No    Review of Systems Constitutional: No fever/chills Eyes: No visual changes. ENT: No sore throat. Cardiovascular: Denies chest pain. Respiratory: Positive for shortness of breath.  Gastrointestinal: No abdominal pain.  No nausea, no vomiting.  No diarrhea.   Genitourinary: Negative for dysuria. Musculoskeletal: Negative for back pain. Skin: Negative for rash. Neurological: Negative for headaches, focal weakness or numbness.  ____________________________________________   PHYSICAL EXAM:  VITAL SIGNS: ED Triage Vitals  Enc Vitals Group     BP 07/02/17 1329 (!) 173/124     Pulse Rate 07/02/17 1329 (!) 121     Resp 07/02/17 1329 18     Temp 07/02/17 1331 97.8 F (36.6 C)     Temp Source 07/02/17 1331 Oral     SpO2 07/02/17 1329 (!) 86 %     Weight 07/02/17 1331 155 lb (70.3 kg)     Height 07/02/17 1331  (1.651 m)    Constitutional: Alert and oriented. Well appearing and in no distress. Eyes: Conjunctivae are normal.  ENT   Head: Normocephalic and atraumatic.   Nose: No congestion/rhinnorhea.   Mouth/Throat: Mucous membranes  are moist.   Neck: No stridor. Hematological/Lymphatic/Immunilogical: No cervical lymphadenopathy. Cardiovascular: Tachycardia, irregularly irregular rhythm.  No murmurs, rubs, or gallops. Respiratory: Normal respiratory effort without tachypnea nor retractions. Breath sounds are clear and equal bilaterally. No wheezes/rales/rhonchi. Gastrointestinal: Soft and non tender. No rebound. No guarding.  Genitourinary: Deferred Musculoskeletal: Normal range of motion in all extremities. No lower extremity edema. Neurologic:  Normal speech and language. No gross focal neurologic deficits are appreciated.  Skin:  Skin is warm, dry and intact. No rash noted. Psychiatric: Mood and affect are normal. Speech and behavior are normal. Patient exhibits appropriate insight and judgment.  ____________________________________________    LABS (pertinent positives/negatives)  Trop <0.03 CBC WBC: 5.3 Hgb: 13.6 Plt: 112 BMP Na 138 K 3.6 Glu 187 Cr 1.71   ____________________________________________   EKG  I, Phineas Semen, attending physician, personally viewed and interpreted this EKG  EKG Time: 1318 Rate: 125 Rhythm: afib with rvr Axis: left axis devation Intervals: qtc 548 QRS: LBBB ST changes: no st elevation Impression: abnormal ekg    ____________________________________________    RADIOLOGY  CXR Concern for infiltrate, bilateral effusions,   ____________________________________________   PROCEDURES  Procedures  ____________________________________________   INITIAL IMPRESSION / ASSESSMENT AND PLAN / ED COURSE  Pertinent labs & imaging results that were available during my care of the patient were reviewed by me and considered in my medical decision making (see chart for details).  patient presented to the emergency department today because of concerns for shortness breath. She has not been feeling well for about a week. Patient was tachycardic with A. fib  with RVR. Additionally patient was initially hypoxic on room air. Chest x-ray somewhat concerning for pneumonia. Will plan on treating with IV antibiotics, will plan on admission to hospital service.  ____________________________________________   FINAL CLINICAL IMPRESSION(S) / ED DIAGNOSES  Final diagnoses:  SOB (shortness of breath)  Community acquired pneumonia, unspecified laterality  Hypoxia  Atrial fibrillation with rapid ventricular response (HCC)     Note: This dictation was prepared with Dragon dictation. Any transcriptional errors that result from this process are unintentional     Phineas Semen, MD 07/02/17 1520

## 2017-07-03 DIAGNOSIS — R0602 Shortness of breath: Secondary | ICD-10-CM | POA: Diagnosis not present

## 2017-07-03 DIAGNOSIS — J189 Pneumonia, unspecified organism: Secondary | ICD-10-CM | POA: Diagnosis not present

## 2017-07-03 LAB — GLUCOSE, CAPILLARY
GLUCOSE-CAPILLARY: 123 mg/dL — AB (ref 65–99)
GLUCOSE-CAPILLARY: 171 mg/dL — AB (ref 65–99)
Glucose-Capillary: 96 mg/dL (ref 65–99)

## 2017-07-03 LAB — HEMOGLOBIN A1C
HEMOGLOBIN A1C: 5.8 % — AB (ref 4.8–5.6)
Mean Plasma Glucose: 119.76 mg/dL

## 2017-07-03 LAB — PHOSPHORUS: Phosphorus: 1.7 mg/dL — ABNORMAL LOW (ref 2.5–4.6)

## 2017-07-03 MED ORDER — LEVOFLOXACIN 250 MG PO TABS: 250.0000 mg | ORAL_TABLET | ORAL | 0 refills | Status: AC

## 2017-07-03 MED ORDER — LIDOCAINE HCL (PF) 1 % IJ SOLN
5.0000 mL | INTRAMUSCULAR | Status: DC | PRN
  Filled 2017-07-03: qty 5

## 2017-07-03 MED ORDER — LIDOCAINE-PRILOCAINE 2.5-2.5 % EX CREA
1.0000 "application " | TOPICAL_CREAM | CUTANEOUS | Status: DC | PRN
  Filled 2017-07-03: qty 5

## 2017-07-03 MED ORDER — SODIUM CHLORIDE 0.9 % IV SOLN: 100.0000 mL | INTRAVENOUS | Status: DC | PRN

## 2017-07-03 MED ORDER — HEPARIN SODIUM (PORCINE) 1000 UNIT/ML DIALYSIS
1000.0000 [IU] | INTRAMUSCULAR | Status: DC | PRN
  Filled 2017-07-03: qty 1

## 2017-07-03 MED ORDER — ALTEPLASE 2 MG IJ SOLR
2.0000 mg | Freq: Once | INTRAMUSCULAR | Status: DC | PRN
  Filled 2017-07-03: qty 2

## 2017-07-03 MED ORDER — INFLUENZA VAC SPLIT HIGH-DOSE 0.5 ML IM SUSY: 0.5000 mL | PREFILLED_SYRINGE | INTRAMUSCULAR | Status: DC

## 2017-07-03 MED ORDER — ORAL CARE MOUTH RINSE: 15.0000 mL | Freq: Two times a day (BID) | OROMUCOSAL | Status: DC

## 2017-07-03 MED ORDER — PENTAFLUOROPROP-TETRAFLUOROETH EX AERO
1.0000 "application " | INHALATION_SPRAY | CUTANEOUS | Status: DC | PRN
  Filled 2017-07-03: qty 30

## 2017-07-03 MED ORDER — TRAMADOL HCL 50 MG PO TABS: 50.0000 mg | ORAL_TABLET | Freq: Four times a day (QID) | ORAL | Status: DC | PRN

## 2017-07-03 NOTE — Progress Notes (Signed)
POST DIALYSIS ASSESSMENT 

## 2017-07-03 NOTE — Progress Notes (Signed)
Nurse attempting to call daughter 3 times, no answer. Left voicemail.

## 2017-07-03 NOTE — Progress Notes (Signed)
Pt arrived back to room from 145, nurse paged prime doc about discharge. MD Gouru paged and notified. MD Gouru notified this nurse that per MD [facility] pt can discharge home today.

## 2017-07-03 NOTE — Progress Notes (Signed)
This note also relates to the following rows which could not be included: Resp - Cannot attach notes to unvalidated device data SpO2 - Cannot attach notes to unvalidated device data  HD STARTED

## 2017-07-03 NOTE — Progress Notes (Signed)
MD Sainani notified pts BP 161/88. No new orders

## 2017-07-03 NOTE — Progress Notes (Signed)
Pts Daughter Molly Wolf notified that pt is ready for discharge. Molly Wolf is on the way.

## 2017-07-03 NOTE — Progress Notes (Signed)
This note also relates to the following rows which could not be included: Resp - Cannot attach notes to unvalidated device data BP - Cannot attach notes to unvalidated device data  HD COMPLETED  

## 2017-07-03 NOTE — Progress Notes (Addendum)
DISCHARGE NOTE:  Pt given discharge instructions and prescription, pts daughter Lupita Leash at bedside. Pt wheeled to car by staff.

## 2017-07-03 NOTE — Progress Notes (Signed)
PRE DIALYSIS ASSESSMENT 

## 2017-07-03 NOTE — Discharge Summary (Signed)
[employer] - [place] [facility]   PATIENT NAME: Molly Wolf    MR#:  [id number]  DATE OF BIRTH:  26-Mar-1941  DATE OF ADMISSION:  07/02/2017 ADMITTING PHYSICIAN: Ramonita Lab, MD  DATE OF DISCHARGE: No discharge date for patient encounter.  PRIMARY CARE PHYSICIAN: Lauro Regulus, MD    ADMISSION DIAGNOSIS:  SOB (shortness of breath) [R06.02] Hypoxia [R09.02] Atrial fibrillation with rapid ventricular response (HCC) [I48.91] Community acquired pneumonia, unspecified laterality [J18.9]  DISCHARGE DIAGNOSIS:  Active Problems:   PNA (pneumonia)   SECONDARY DIAGNOSIS:   Past Medical History:  Diagnosis Date  . Anxiety   . Arthritis   . Chronic combined systolic and diastolic CHF (congestive heart failure) (HCC)    a. 01/2008 Echo ([facility]): EF > 55%, mod-sev LVH w/ grade 1 DD, biatrial enlargement, mild AS, trace MR/TR; b. EF 45-50%, GR2DD, mild to moderate aortic stenosis, mild aortic insufficieny, mild to moderate mitral regurgitation, mild tricuspid regurgitation, mild biatrial enlargement, and a small pericardial effusion  . CKD (chronic kidney disease), stage IV (HCC)   . Closed patellar sleeve fracture of right knee    a. 01/2017 - conservatively managed.  Marland Kitchen COPD (chronic obstructive pulmonary disease) (HCC)   . Coronary artery disease    a. s/p remote stenting of unknown vessel @ [facility].  . Depression   . Diabetes mellitus with complication (HCC)   . DVT (deep venous thrombosis) (HCC)    a. in the setting of right patellar fracture on Coumadin  . Hyperlipidemia   . Hypertension   . Parkinson disease [facility])     HOSPITAL COURSE:   76 year old female with past medical history of end-stage renal disease on hemodialysis, chronic combined diastolic/systolic CHF, COPD, history of coronary artery disease, diabetes, previous history of DVT, Parkinson's disease, hypertension, hyperlipidemia who presented to the hospital due to shortness of breath.  1.  Shortness of breath-secondary to community acquired pneumonia. -Patient was admitted to the hospital started on IV Levaquin and has clinically improved. She is no longer hypoxic, shortness of breath is improved. She is afebrile, her white cell count is normal. She is being discharged on oral Levaquin for additional 3 days.  2. End-stage renal disease on hemodialysis-patient had no evidence of hyperkalemia or volume overload. She missed her hemodialysis yesterday and therefore is being dialyzed today and being discharged home. Patient will continue hemodialysis on a Monday Wednesday Friday schedule.  3. History of Parkinson's disease-patient will continue her Sinemet  4. Diabetes type 2 without complication-patient will resume her Levemir, glimepiride upon discharge.  5. Hyperlipidemia-patient will resume her atorvastatin.  6. History of essential hypertension-patient will continue hydralazine, Imdur, metoprolol.  7. History of urinary incontinence-patient will continue her Myrbetriq.    8. Depression - pt. Will cont. Lexapro.    DISCHARGE CONDITIONS:   Stable.   CONSULTS OBTAINED:  Treatment Team:  Mady Haagensen, MD  DRUG ALLERGIES:  No Known Allergies  DISCHARGE MEDICATIONS:   Allergies as of 07/03/2017   No Known Allergies     Medication List    TAKE these medications   acetaminophen 325 MG tablet Commonly known as:  TYLENOL Take 650 mg by mouth every 6 (six) hours as needed for mild pain.   aspirin EC 81 MG tablet Take 1 tablet (81 mg total) by mouth daily. What changed:  when to take this   atorvastatin 20 MG tablet Commonly known as:  LIPITOR Take 20 mg by mouth at bedtime.   Carbidopa-Levodopa ER 25-100  MG tablet controlled release Commonly known as:  SINEMET CR Take 2 tablets by mouth 4 (four) times daily. What changed:  when to take this   CoQ10 50 MG Caps Take 50 mg by mouth daily.   Cranberry 450 MG Tabs Take 450 mg by mouth daily.   docusate  sodium 100 MG capsule Commonly known as:  COLACE Take 1 capsule (100 mg total) by mouth 2 (two) times daily. What changed:  when to take this  reasons to take this   doxepin 10 MG capsule Commonly known as:  SINEQUAN Take 10 mg by mouth at bedtime.   escitalopram 20 MG tablet Commonly known as:  LEXAPRO Take 20 mg by mouth daily.   feeding supplement (NEPRO CARB STEADY) Liqd Take 237 mLs by mouth 3 (three) times daily between meals.   Fish Oil 1000 MG Caps Take 1,000 mg by mouth daily.   gabapentin 300 MG capsule Commonly known as:  NEURONTIN Take 300 mg by mouth 2 (two) times daily.   glimepiride 1 MG tablet Commonly known as:  AMARYL Take 1 mg by mouth daily.   hydrALAZINE 10 MG tablet Commonly known as:  APRESOLINE Take 1 tablet (10 mg total) by mouth 2 (two) times daily.   insulin detemir 100 UNIT/ML injection Commonly known as:  LEVEMIR Inject 7 Units into the skin at bedtime.   isosorbide mononitrate 60 MG 24 hr tablet Commonly known as:  IMDUR Take 1 tablet (60 mg total) by mouth daily.   levofloxacin 250 MG tablet Commonly known as:  LEVAQUIN Take 1 tablet (250 mg total) by mouth every other day.   loratadine 10 MG tablet Commonly known as:  CLARITIN Take 5 mg by mouth daily as needed for allergies.   LORazepam 0.5 MG tablet Commonly known as:  ATIVAN Take 0.5 mg by mouth every 12 (twelve) hours. And as needed for anxiety/pain   metoprolol tartrate 100 MG tablet Commonly known as:  LOPRESSOR Take 1 tablet (100 mg total) by mouth 2 (two) times daily.   MYRBETRIQ 25 MG Tb24 tablet Generic drug:  mirabegron ER Take 25 mg by mouth daily.   nitroGLYCERIN 0.4 MG SL tablet Commonly known as:  NITROSTAT Place 0.4 mg under the tongue every 5 (five) minutes as needed for chest pain.   polyethylene glycol packet Commonly known as:  MIRALAX / GLYCOLAX Take 17 g by mouth daily as needed for mild constipation.   promethazine 25 MG tablet Commonly  known as:  PHENERGAN Take 25 mg by mouth every 6 (six) hours as needed for nausea or vomiting.   saccharomyces boulardii 250 MG capsule Commonly known as:  FLORASTOR Take 250 mg by mouth daily.   traMADol 50 MG tablet Commonly known as:  ULTRAM Take 1 tablet (50 mg total) by mouth daily as needed. What changed:  reasons to take this            Discharge Care Instructions        Start     Ordered   07/03/17 0000  levofloxacin (LEVAQUIN) 250 MG tablet  Every 48 hours     07/03/17 0929        DISCHARGE INSTRUCTIONS:   DIET:  Cardiac diet and Diabetic diet  DISCHARGE CONDITION:  Stable  ACTIVITY:  Activity as tolerated  OXYGEN:  Home Oxygen: No.   Oxygen Delivery: room air  DISCHARGE LOCATION:  home   If you experience worsening of your admission symptoms, develop shortness of breath, life threatening  emergency, suicidal or homicidal thoughts you must seek medical attention immediately by calling 911 or calling your MD immediately  if symptoms less severe.  You Must read complete instructions/literature along with all the possible adverse reactions/side effects for all the Medicines you take and that have been prescribed to you. Take any new Medicines after you have completely understood and accpet all the possible adverse reactions/side effects.   Please note  You were cared for by a hospitalist during your hospital stay. If you have any questions about your discharge medications or the care you received while you were in the hospital after you are discharged, you can call the unit and asked to speak with the hospitalist on call if the hospitalist that took care of you is not available. Once you are discharged, your primary care physician will handle any further medical issues. Please note that NO REFILLS for any discharge medications will be authorized once you are discharged, as it is imperative that you return to your primary care physician (or establish a  relationship with a primary care physician if you do not have one) for your aftercare needs so that they can reassess your need for medications and monitor your lab values.     Today   Shortness of breath improved.  No fever, cough, congestion.    VITAL SIGNS:  Blood pressure (!) 149/89, pulse 93, temperature 97.6 F (36.4 C), temperature source Oral, resp. rate 18, height  (1.651 m), weight 70.3 kg (155 lb), SpO2 97 %.  I/O:    Intake/Output Summary (Last 24 hours) at 07/03/17 1305 Last data filed at 07/03/17 0917  Gross per 24 hour  Intake              480 ml  Output              100 ml  Net              380 ml    PHYSICAL EXAMINATION:  GENERAL:  76 y.o.-year-old patient lying in the bed with no acute distress.  EYES: Pupils equal, round, reactive to light and accommodation. No scleral icterus. Extraocular muscles intact.  HEENT: Head atraumatic, normocephalic. Oropharynx and nasopharynx clear.  NECK:  Supple, no jugular venous distention. No thyroid enlargement, no tenderness.  LUNGS: Normal breath sounds bilaterally, no wheezing, rales,rhonchi. No use of accessory muscles of respiration.  CARDIOVASCULAR: S1, S2 normal. No murmurs, rubs, or gallops.  ABDOMEN: Soft, non-tender, non-distended. Bowel sounds present. No organomegaly or mass.  EXTREMITIES: No pedal edema, cyanosis, or clubbing.  NEUROLOGIC: Cranial nerves II through XII are intact. No focal motor or sensory defecits b/l.  PSYCHIATRIC: The patient is alert and oriented x 3.  SKIN: No obvious rash, lesion, or ulcer.   DATA REVIEW:   CBC  Recent Labs Lab 07/02/17 1336  WBC 5.3  HGB 13.6  HCT 41.0  PLT 112*    Chemistries   Recent Labs Lab 07/02/17 1336  NA 138  K 3.6  CL 99*  CO2 28  GLUCOSE 187*  BUN 16  CREATININE 1.71*  CALCIUM 9.6    Cardiac Enzymes  Recent Labs Lab 07/02/17 1336  TROPONINI <0.03    Microbiology Results  Results for orders placed or performed during the  hospital encounter of 07/02/17  Blood culture (routine x 2)     Status: None (Preliminary result)   Collection Time: 07/02/17  3:00 PM  Result Value Ref Range Status   Specimen Description  Final    BLOOD Blood Culture results may not be optimal due to an excessive volume of blood received in culture bottles   Special Requests BLOOD LEFT ARM  Final   Culture NO GROWTH < 24 HOURS  Final   Report Status PENDING  Incomplete  Blood culture (routine x 2)     Status: None (Preliminary result)   Collection Time: 07/02/17  3:07 PM  Result Value Ref Range Status   Specimen Description   Final    BLOOD Blood Culture results may not be optimal due to an excessive volume of blood received in culture bottles   Special Requests RIGHT ANTECUBITAL  Final   Culture NO GROWTH < 24 HOURS  Final   Report Status PENDING  Incomplete    RADIOLOGY:  Dg Chest 2 View  Result Date: 07/02/2017 CLINICAL DATA:  Shortness of breath.  Dialysis patient. EXAM: CHEST  2 VIEW COMPARISON:  04/11/2017. FINDINGS: Small amount of patchy opacity at the left lung base, with significant improvement. Small bilateral pleural effusions. Interval right jugular catheter with its tip in the superior vena cava. No pneumothorax. Stable enlarged cardiac silhouette and mildly prominent interstitial markings. Normal pulmonary vascularity. Thoracic spine degenerative changes. Old, healed bilateral rib fractures. IMPRESSION: 1. Small bilateral pleural effusions, with improvement. 2. Small amount of patchy atelectasis or pneumonia at the left lung base with improvement. 3. Stable cardiomegaly and chronic interstitial lung disease. Electronically Signed   By: Beckie Salts M.D.   On: 07/02/2017 14:25      Management plans discussed with the patient, family and they are in agreement.  CODE STATUS:     Code Status Orders        Start     Ordered   07/02/17 1707  Full code  Continuous     07/02/17 1709      Advance Directive  Documentation     Most Recent Value  Type of Advance Directive  Healthcare Power of Attorney, Living will  Pre-existing out of facility DNR order (yellow form or pink MOST form)  -  "MOST" Form in Place?  -      TOTAL TIME TAKING CARE OF THIS PATIENT: 40 minutes.    Houston Siren M.D on 07/03/2017 at 1:05 PM  Between 7am to 6pm - Pager - [phone]  After 6pm go to [email or web address] - Scientist, research (life sciences) [facility]  Office  [phone]  CC: Primary care physician; Lauro Regulus, MD

## 2017-07-05 LAB — HIV ANTIBODY (ROUTINE TESTING W REFLEX): HIV SCREEN 4TH GENERATION: NONREACTIVE

## 2017-07-05 LAB — PARATHYROID HORMONE, INTACT (NO CA): PTH: 91 pg/mL — ABNORMAL HIGH (ref 15–65)

## 2017-07-06 LAB — HEPATITIS B SURFACE ANTIGEN: HEP B S AG: NEGATIVE

## 2017-07-07 LAB — CULTURE, BLOOD (ROUTINE X 2)
CULTURE: NO GROWTH
CULTURE: NO GROWTH

## 2017-07-14 ENCOUNTER — Emergency Department: Payer: Medicare Other

## 2017-07-14 ENCOUNTER — Other Ambulatory Visit (INDEPENDENT_AMBULATORY_CARE_PROVIDER_SITE_OTHER): Payer: Self-pay | Admitting: Vascular Surgery

## 2017-07-14 ENCOUNTER — Emergency Department
Admission: EM | Admit: 2017-07-14 | Discharge: 2017-07-14 | Disposition: A | Discharge status: Home / Self Care | Payer: Medicare Other | Attending: Emergency Medicine | Admitting: Emergency Medicine

## 2017-07-14 DIAGNOSIS — I5042 Chronic combined systolic (congestive) and diastolic (congestive) heart failure: Secondary | ICD-10-CM | POA: Diagnosis not present

## 2017-07-14 DIAGNOSIS — R55 Syncope and collapse: Secondary | ICD-10-CM | POA: Insufficient documentation

## 2017-07-14 DIAGNOSIS — N186 End stage renal disease: Secondary | ICD-10-CM

## 2017-07-14 DIAGNOSIS — J449 Chronic obstructive pulmonary disease, unspecified: Secondary | ICD-10-CM | POA: Diagnosis not present

## 2017-07-14 DIAGNOSIS — R112 Nausea with vomiting, unspecified: Secondary | ICD-10-CM | POA: Diagnosis not present

## 2017-07-14 DIAGNOSIS — G20 Parkinson's disease: Secondary | ICD-10-CM | POA: Diagnosis not present

## 2017-07-14 DIAGNOSIS — N184 Chronic kidney disease, stage 4 (severe): Secondary | ICD-10-CM | POA: Insufficient documentation

## 2017-07-14 DIAGNOSIS — Z992 Dependence on renal dialysis: Secondary | ICD-10-CM | POA: Diagnosis not present

## 2017-07-14 DIAGNOSIS — I130 Hypertensive heart and chronic kidney disease with heart failure and stage 1 through stage 4 chronic kidney disease, or unspecified chronic kidney disease: Secondary | ICD-10-CM | POA: Diagnosis not present

## 2017-07-14 DIAGNOSIS — E1165 Type 2 diabetes mellitus with hyperglycemia: Secondary | ICD-10-CM | POA: Insufficient documentation

## 2017-07-14 DIAGNOSIS — I2510 Atherosclerotic heart disease of native coronary artery without angina pectoris: Secondary | ICD-10-CM | POA: Insufficient documentation

## 2017-07-14 DIAGNOSIS — R109 Unspecified abdominal pain: Secondary | ICD-10-CM | POA: Diagnosis present

## 2017-07-14 HISTORY — DX: Disorder of kidney and ureter, unspecified: N28.9

## 2017-07-14 LAB — COMPREHENSIVE METABOLIC PANEL
ALBUMIN: 3.5 g/dL (ref 3.5–5.0)
AST: 9 U/L — ABNORMAL LOW (ref 15–41)
Alkaline Phosphatase: 54 U/L (ref 38–126)
Anion gap: 10 (ref 5–15)
BUN: 20 mg/dL (ref 6–20)
CHLORIDE: 98 mmol/L — AB (ref 101–111)
CO2: 25 mmol/L (ref 22–32)
CREATININE: 1.71 mg/dL — AB (ref 0.44–1.00)
Calcium: 8.9 mg/dL (ref 8.9–10.3)
GFR calc non Af Amer: 28 mL/min — ABNORMAL LOW (ref >60)
GFR, EST AFRICAN AMERICAN: 32 mL/min — AB (ref >60)
GLUCOSE: 170 mg/dL — AB (ref 65–99)
Potassium: 3.5 mmol/L (ref 3.5–5.1)
SODIUM: 133 mmol/L — AB (ref 135–145)
Total Bilirubin: 1.4 mg/dL — ABNORMAL HIGH (ref 0.3–1.2)
Total Protein: 6.1 g/dL — ABNORMAL LOW (ref 6.5–8.1)

## 2017-07-14 LAB — GLUCOSE, CAPILLARY: Glucose-Capillary: 141 mg/dL — ABNORMAL HIGH (ref 65–99)

## 2017-07-14 LAB — CBC
HCT: 38.9 % (ref 35.0–47.0)
HEMOGLOBIN: 13.1 g/dL (ref 12.0–16.0)
MCH: 29.4 pg (ref 26.0–34.0)
MCHC: 33.6 g/dL (ref 32.0–36.0)
MCV: 87.7 fL (ref 80.0–100.0)
Platelets: 127 10*3/uL — ABNORMAL LOW (ref 150–440)
RBC: 4.44 MIL/uL (ref 3.80–5.20)
RDW: 16 % — ABNORMAL HIGH (ref 11.5–14.5)
WBC: 3.5 10*3/uL — ABNORMAL LOW (ref 3.6–11.0)

## 2017-07-14 LAB — LIPASE, BLOOD: LIPASE: 19 U/L (ref 11–51)

## 2017-07-14 MED ORDER — IOPAMIDOL (ISOVUE-370) INJECTION 76%
75.0000 mL | Freq: Once | INTRAVENOUS | Status: AC | PRN
  Administered 2017-07-14: 75 mL via INTRAVENOUS

## 2017-07-14 MED ORDER — ONDANSETRON 4 MG PO TBDP: 4.0000 mg | ORAL_TABLET | Freq: Three times a day (TID) | ORAL | 0 refills | Status: AC | PRN

## 2017-07-14 MED ORDER — SODIUM CHLORIDE 0.9 % IV BOLUS (SEPSIS)
500.0000 mL | Freq: Once | INTRAVENOUS | Status: AC
  Administered 2017-07-14: 500 mL via INTRAVENOUS

## 2017-07-14 NOTE — ED Triage Notes (Addendum)
Pt c/o nausea, vomiting and abdominal pain after eating for the last week. Denies CP or SOB. Reports weight loss. Pt alert and oriented X4, active, cooperative, pt in NAD. RR even and unlabored, color WNL.  Pt hx of Parkinsons, tremors at baseline.

## 2017-07-14 NOTE — ED Notes (Signed)
Patient transported to CT 

## 2017-07-14 NOTE — ED Provider Notes (Signed)
[facility] Emergency Department Provider Note       Time seen: ----------------------------------------- 12:37 PM on 07/14/2017 -----------------------------------------     I have reviewed the triage vital signs and the nursing notes.   HISTORY   Chief Complaint Anorexia    HPI Molly Wolf is a 75 y.o. female who presents to the ED for nausea, vomiting and abdominal pain after eating for the last week. Patient denies chest pain or shortness of breath. She does report some weight loss. She has also had some cough and congestion and reportedly was recently treated for pneumonia. She did not feel well up to go to dialysis today but went to dialysis normally on Monday.  Past Medical History:  Diagnosis Date  . Anxiety   . Arthritis   . Chronic combined systolic and diastolic CHF (congestive heart failure) (HCC)    a. 01/2008 Echo ([facility]): EF > 55%, mod-sev LVH w/ grade 1 DD, biatrial enlargement, mild AS, trace MR/TR; b. EF 45-50%, GR2DD, mild to moderate aortic stenosis, mild aortic insufficieny, mild to moderate mitral regurgitation, mild tricuspid regurgitation, mild biatrial enlargement, and a small pericardial effusion  . CKD (chronic kidney disease), stage IV (HCC)   . Closed patellar sleeve fracture of right knee    a. 01/2017 - conservatively managed.  Marland Kitchen COPD (chronic obstructive pulmonary disease) (HCC)   . Coronary artery disease    a. s/p remote stenting of unknown vessel @ [facility].  . Depression   . Diabetes mellitus with complication (HCC)   . DVT (deep venous thrombosis) (HCC)    a. in the setting of right patellar fracture on Coumadin  . Hyperlipidemia   . Hypertension   . Parkinson disease [facility])     Patient Active Problem List   Diagnosis Date Noted  . PNA (pneumonia) 07/02/2017  . DVT (deep venous thrombosis) (HCC) 05/21/2017  . Confusion   . Goals of care, counseling/discussion   . Acute renal failure with acute tubular necrosis  superimposed on stage 4 chronic kidney disease (HCC)   . Palliative care encounter   . Coronary artery disease of native artery of native heart with stable angina pectoris (HCC) 03/31/2017  . Persistent atrial fibrillation (HCC) 03/31/2017  . Chronic combined systolic and diastolic heart failure (HCC) 02/26/2017  . HTN (hypertension) 02/26/2017  . Parkinson disease (HCC) 02/26/2017  . Intractable pain 12/27/2016    Past Surgical History:  Procedure Laterality Date  . BACK SURGERY    . CORONARY ANGIOPLASTY WITH STENT PLACEMENT    . DIALYSIS/PERMA CATHETER INSERTION N/A 04/22/2017   Procedure: Dialysis/Perma Catheter Insertion;  Surgeon: Annice Needy, MD;  Location: [facility] INVASIVE CV LAB;  Service: Cardiovascular;  Laterality: N/A;  . ESOPHAGOGASTRODUODENOSCOPY N/A 04/09/2017   Procedure: ESOPHAGOGASTRODUODENOSCOPY (EGD);  Surgeon: Wyline Mood, MD;  Location: [facility] ENDOSCOPY;  Service: Endoscopy;  Laterality: N/A;  . IVC FILTER INSERTION N/A 04/23/2017   Procedure: IVC Filter Insertion;  Surgeon: Renford Dills, MD;  Location: [facility] INVASIVE CV LAB;  Service: Cardiovascular;  Laterality: N/A;  . NECK SURGERY      Allergies Patient has no known allergies.  Social History Social History  Substance Use Topics  . Smoking status: Never Smoker  . Smokeless tobacco: Never Used  . Alcohol use No    Review of Systems Constitutional: Negative for fever. Cardiovascular: Negative for chest pain. Respiratory: positive for cough Gastrointestinal:  positive for vomiting and abdominal pain Genitourinary: Negative for dysuria. Musculoskeletal: Negative for back pain. Skin: Negative for  rash. Neurological: positive for weakness  All systems negative/normal/unremarkable except as stated in the HPI  ____________________________________________   PHYSICAL EXAM:  VITAL SIGNS: ED Triage Vitals  Enc Vitals Group     BP 07/14/17 1207 95/60     Pulse Rate 07/14/17 1207 86     Resp 07/14/17  1207 18     Temp 07/14/17 1207 97.7 F (36.5 C)     Temp Source 07/14/17 1207 Oral     SpO2 07/14/17 1207 94 %     Weight 07/14/17 1208 136 lb (61.7 kg)     Height 07/14/17 1208  (1.651 m)     Head Circumference --      Peak Flow --      Pain Score 07/14/17 1207 0     Pain Loc --      Pain Edu? --      Excl. in GC? --     Constitutional: Alert and oriented. Well appearing and in no distress. Eyes: Conjunctivae are normal. Normal extraocular movements. ENT   Head: Normocephalic and atraumatic.   Nose: No congestion/rhinnorhea.   Mouth/Throat: Mucous membranes are moist.   Neck: No stridor. Cardiovascular: Normal rate, regular rhythm. No murmurs, rubs, or gallops. Respiratory: Normal respiratory effort without tachypnea nor retractions. Breath sounds are clear and equal bilaterally. No wheezes/rales/rhonchi. Gastrointestinal: Soft and nontender. Normal bowel sounds Musculoskeletal: Nontender with normal range of motion in extremities. No lower extremity tenderness nor edema. Neurologic:  Normal speech and language. No gross focal neurologic deficits are appreciated.  Skin:  Skin is warm, dry and intact. No rash noted. Psychiatric: Mood and affect are normal. Speech and behavior are normal.  ____________________________________________  EKG: Interpreted by me.atrial fibrillation with a rate of 85 bpm, LVH, wide QRS and likely left bundle branch block, normal QT.  ____________________________________________  ED COURSE:  Pertinent labs & imaging results that were available during my care of the patient were reviewed by me and considered in my medical decision making (see chart for details). Patient presents for weakness as well as vomiting and abdominal pain, we will assess with labs and imaging as indicated. Patient was initially hypotensive on arrival   Procedures ____________________________________________   LABS (pertinent positives/negatives)  Labs  Reviewed  COMPREHENSIVE METABOLIC PANEL - Abnormal; Notable for the following:       Result Value   Sodium 133 (*)    Chloride 98 (*)    Glucose, Bld 170 (*)    Creatinine, Ser 1.71 (*)    Total Protein 6.1 (*)    AST 9 (*)    ALT <5 (*)    Total Bilirubin 1.4 (*)    GFR calc non Af Amer 28 (*)    GFR calc Af Amer 32 (*)    All other components within normal limits  CBC - Abnormal; Notable for the following:    WBC 3.5 (*)    RDW 16.0 (*)    Platelets 127 (*)    All other components within normal limits  GLUCOSE, CAPILLARY - Abnormal; Notable for the following:    Glucose-Capillary 141 (*)    All other components within normal limits  LIPASE, BLOOD  URINALYSIS, COMPLETE (UACMP) WITH MICROSCOPIC  TROPONIN I  CBG MONITORING, ED    RADIOLOGY Images were viewed by me  chest x-ray IMPRESSION: 1. Small pleural effusions, stable to mildly improved. 2. Improved aeration of the left lung base. No definite evidence of pneumonia or edema. IMPRESSION: VASCULAR  Celiac and SMA  are patent. Significant narrowing at the origin of the IMA may be present. These findings result in a low probability for mesenteric ischemia.  NON-VASCULAR  Small pericardial effusion is stable.  Small bilateral pleural effusions have improved  2.7 cm indeterminate patchy opacity at the right lung base. Initial follow-up by chest CT without contrast is recommended in 3 months to confirm persistence. This recommendation follows the consensus statement: Recommendations for the Management of Subsolid Pulmonary Nodules Detected at CT: A Statement from the Fleischner Society as published in Radiology 2013; 266:304-317.  Low density surrounding the portal triads in the liver suggests volume overload. An inflammatory process of the liver cannot be excluded. Correlate with liver function tests.  Indeterminate low-density lesion in the left kidney with some central enhancement. Cystic neoplasm is not  excluded. MRI of the kidneys is recommended.  Improved right lower rectus sheath hematoma. ____________________________________________  DIFFERENTIAL DIAGNOSIS   sepsis, anemia, pneumonia, mesenteric ischemia, dehydration, vasovagal syncope, renal failure, electrolyte abnormality   FINAL ASSESSMENT AND PLAN  near syncope, abdominal pain and vomiting   Plan: Patient's labs and imaging were dictated above. Patient had presented for near syncope which appears to be from temporary low blood pressure and possible vasovagal event. We did give some gentle fluids here and ordered a CT with IV contrast to evaluate for mesenteric ischemia. CT scan was reassuring and she does not appear to be volume overloaded. CT scan does show improvement in her pleural and pericardial effusions. I did discussed with nephrology and we will arrange for dialysis tomorrow.   Emily Filbert, MD   Note: This note was generated in part or whole with voice recognition software. Voice recognition is usually quite accurate but there are transcription errors that can and very often do occur. I apologize for any typographical errors that were not detected and corrected.     Emily Filbert, MD 07/14/17 [phone]

## 2017-07-14 NOTE — ED Notes (Signed)
Took patient off bedpan. Could not get a sample. Gave patient a blanket.

## 2017-07-14 NOTE — ED Notes (Signed)
Pt states she does not have to urinate at this time - she will attempt after receiving fluids

## 2017-07-14 NOTE — ED Notes (Signed)
Patient reports decreased oral intake over the past several weeks with weight loss and increasing weakness.  Patient reports occasional abdominal pain although she denies abdominal pain at this time.

## 2017-07-15 ENCOUNTER — Encounter (INDEPENDENT_AMBULATORY_CARE_PROVIDER_SITE_OTHER): Payer: Medicare Other | Admitting: Vascular Surgery

## 2017-07-15 ENCOUNTER — Encounter (INDEPENDENT_AMBULATORY_CARE_PROVIDER_SITE_OTHER): Payer: Medicare Other

## 2017-07-15 ENCOUNTER — Other Ambulatory Visit: Payer: Self-pay | Admitting: Family

## 2017-07-15 MED ORDER — ISOSORBIDE MONONITRATE ER 60 MG PO TB24
60.0000 mg | ORAL_TABLET | Freq: Every day | ORAL | 3 refills | Status: DC
Start: 2017-07-15 — End: 2017-08-08

## 2017-07-15 MED ORDER — GLIMEPIRIDE 1 MG PO TABS: 1.0000 mg | ORAL_TABLET | Freq: Every day | ORAL | 3 refills | Status: DC

## 2017-07-15 MED ORDER — GABAPENTIN 300 MG PO CAPS: 300.0000 mg | ORAL_CAPSULE | Freq: Two times a day (BID) | ORAL | 3 refills | Status: DC

## 2017-07-18 NOTE — Progress Notes (Deleted)
Follow-up Outpatient Visit Date: 07/20/2017  Primary Care Provider: Lauro Regulus, MD [address] [facility] [place] [address]  Chief Complaint: ***  HPI:  Molly Wolf is a 76 y.o. year-old female with history of coronary artery disease status post remote PCI at [facility], chronic systolic and diastolic heart failure, persistent atrial fibrillation complicated by rectus sheath hematoma while on warfarin, chronic kidney disease recently started on hemodialysis, Parkinson's disease, diabetes mellitus, COPD, DVT, hypertension, hyperlipidemia, and anxiety, who presents for follow-up of shortness of breath and atrial fibrillation. I last saw her in August, at which time her shortness of breath had improved from prior visits. She remained at [facility] for rehab, where she had fallen at least once since her preceding hospitalization. She was not on any anticoagulation at the time due to rectus sheath hematoma in the setting of supratherapeutic INR while in the hospital.  --------------------------------------------------------------------------------------------------  Cardiovascular History & Procedures: Cardiovascular Problems:  Coronary artery disease  Chronic diastolic heart failure  Pulmonary hypertension  Persistent atrial fibrillation  Risk Factors:  Known coronary artery disease, diabetes mellitus, hypertension, hyperlipidemia, and age greater than 61  Cath/PCI:  Remote PCI at [facility] (details unknown)  CV Surgery:  None  EP Procedures and Devices:  14-day event monitor (02/10/17): Sinus rhythm with frequent PVCs, occasional PACs, and multiple atrial runs including sustained atrial rhythm. Periods is most suggestive of SVT, though atrial flutter and/or fibrillation cannot be excluded.  Non-Invasive Evaluation(s):  TTE (04/04/17): Moderate LVH. LVEF 35% with hypokinesis of the mid and apical anterior and anterolateral walls. Aortic  sclerosis with mild regurgitation. Moderate MR. Moderate left atrial enlargement. Mild right atrial enlargement. Moderate to severe pulmonary hypertension. Small pericardial effusion.  TTE (01/26/17): Mild LVH with moderate focal basal hypertrophy of the septum. LVEF 45-50% with akinesis of the basal and mid inferolateral and inferior myocardium. Grade 2 diastolic dysfunction. Moderately thickened and calcified aortic valve with mild to moderate stenosis and mild regurgitation. Mitral annular calcification with mild to moderate regurgitation. Mild left atrial enlargement. Mildly dilated right ventricle with mildly reduced contraction. Mild right atrial enlargement. Moderate to severe pulmonary hypertension.  Recent CV Pertinent Labs: Lab Results  Component Value Date   CHOL 98 04/04/2017   HDL 37 (L) 04/04/2017   LDLCALC 49 04/04/2017   TRIG 60 04/04/2017   CHOLHDL 2.6 04/04/2017   INR 2.33 04/27/2017   BNP 980.0 (H) 04/05/2017   K 3.5 07/14/2017   MG 2.1 04/04/2017   BUN 20 07/14/2017   CREATININE 1.71 (H) 07/14/2017    Past medical and surgical history were reviewed and updated in EPIC.  No outpatient prescriptions have been marked as taking for the 07/20/17 encounter (Appointment) with Nadelyn Enriques, Cristal Deer, MD.    Allergies: Patient has no known allergies.  Social History   Social History  . Marital status: Single    Spouse name: N/A  . Number of children: N/A  . Years of education: N/A   Occupational History  . Not on file.   Social History Main Topics  . Smoking status: Never Smoker  . Smokeless tobacco: Never Used  . Alcohol use No  . Drug use: No  . Sexual activity: No   Other Topics Concern  . Not on file   Social History Narrative  . No narrative on file    Family History  Problem Relation Age of Onset  . Cervical cancer Mother   . Kidney failure Father   . CAD  Father   . CAD Brother   . Prostate cancer Neg Hx   . Kidney cancer Neg Hx   . Bladder Cancer  Neg Hx     Review of Systems: A 12-system review of systems was performed and was negative except as noted in the HPI.  --------------------------------------------------------------------------------------------------  Physical Exam: There were no vitals taken for this visit.  General:  *** HEENT: No conjunctival pallor or scleral icterus. Moist mucous membranes.  OP clear. Neck: Supple without lymphadenopathy, thyromegaly, JVD, or HJR. No carotid bruit. Lungs: Normal work of breathing. Clear to auscultation bilaterally without wheezes or crackles. Heart: Regular rate and rhythm without murmurs, rubs, or gallops. Non-displaced PMI. Abd: Bowel sounds present. Soft, NT/ND without hepatosplenomegaly Ext: No lower extremity edema. Radial, PT, and DP pulses are 2+ bilaterally. Skin: Warm and dry without rash.  EKG:  ***  Lab Results  Component Value Date   WBC 3.5 (L) 07/14/2017   HGB 13.1 07/14/2017   HCT 38.9 07/14/2017   MCV 87.7 07/14/2017   PLT 127 (L) 07/14/2017    Lab Results  Component Value Date   NA 133 (L) 07/14/2017   K 3.5 07/14/2017   CL 98 (L) 07/14/2017   CO2 25 07/14/2017   BUN 20 07/14/2017   CREATININE 1.71 (H) 07/14/2017   GLUCOSE 170 (H) 07/14/2017   ALT <5 (L) 07/14/2017    Lab Results  Component Value Date   CHOL 98 04/04/2017   HDL 37 (L) 04/04/2017   LDLCALC 49 04/04/2017   TRIG 60 04/04/2017   CHOLHDL 2.6 04/04/2017    --------------------------------------------------------------------------------------------------  ASSESSMENT AND PLAN: Cristal Deer Ranae Casebier, MD 07/18/2017 9:49 PM

## 2017-07-20 ENCOUNTER — Ambulatory Visit: Payer: Medicare Other | Admitting: Internal Medicine

## 2017-07-29 ENCOUNTER — Other Ambulatory Visit: Payer: Self-pay | Admitting: Nephrology

## 2017-08-05 ENCOUNTER — Emergency Department: Payer: Medicare Other

## 2017-08-05 ENCOUNTER — Inpatient Hospital Stay
Admission: EM | Admit: 2017-08-05 | Discharge: 2017-08-08 | DRG: 682 | Disposition: A | Discharge status: Home Health | Payer: Medicare Other | Attending: Internal Medicine | Admitting: Internal Medicine

## 2017-08-05 ENCOUNTER — Encounter: Payer: Self-pay | Admitting: Emergency Medicine

## 2017-08-05 DIAGNOSIS — N179 Acute kidney failure, unspecified: Principal | ICD-10-CM | POA: Diagnosis present

## 2017-08-05 DIAGNOSIS — R440 Auditory hallucinations: Secondary | ICD-10-CM | POA: Diagnosis not present

## 2017-08-05 DIAGNOSIS — I130 Hypertensive heart and chronic kidney disease with heart failure and stage 1 through stage 4 chronic kidney disease, or unspecified chronic kidney disease: Secondary | ICD-10-CM | POA: Diagnosis not present

## 2017-08-05 DIAGNOSIS — R627 Adult failure to thrive: Secondary | ICD-10-CM | POA: Diagnosis present

## 2017-08-05 DIAGNOSIS — R410 Disorientation, unspecified: Secondary | ICD-10-CM

## 2017-08-05 DIAGNOSIS — E43 Unspecified severe protein-calorie malnutrition: Secondary | ICD-10-CM | POA: Diagnosis present

## 2017-08-05 DIAGNOSIS — R4182 Altered mental status, unspecified: Secondary | ICD-10-CM

## 2017-08-05 DIAGNOSIS — E785 Hyperlipidemia, unspecified: Secondary | ICD-10-CM | POA: Diagnosis not present

## 2017-08-05 DIAGNOSIS — I959 Hypotension, unspecified: Secondary | ICD-10-CM | POA: Diagnosis present

## 2017-08-05 DIAGNOSIS — N184 Chronic kidney disease, stage 4 (severe): Secondary | ICD-10-CM | POA: Diagnosis present

## 2017-08-05 DIAGNOSIS — J449 Chronic obstructive pulmonary disease, unspecified: Secondary | ICD-10-CM | POA: Diagnosis present

## 2017-08-05 DIAGNOSIS — I5042 Chronic combined systolic (congestive) and diastolic (congestive) heart failure: Secondary | ICD-10-CM | POA: Diagnosis present

## 2017-08-05 DIAGNOSIS — N19 Unspecified kidney failure: Secondary | ICD-10-CM

## 2017-08-05 DIAGNOSIS — F329 Major depressive disorder, single episode, unspecified: Secondary | ICD-10-CM | POA: Diagnosis present

## 2017-08-05 DIAGNOSIS — Z955 Presence of coronary angioplasty implant and graft: Secondary | ICD-10-CM

## 2017-08-05 DIAGNOSIS — Z79899 Other long term (current) drug therapy: Secondary | ICD-10-CM

## 2017-08-05 DIAGNOSIS — I482 Chronic atrial fibrillation: Secondary | ICD-10-CM | POA: Diagnosis present

## 2017-08-05 DIAGNOSIS — I083 Combined rheumatic disorders of mitral, aortic and tricuspid valves: Secondary | ICD-10-CM | POA: Diagnosis present

## 2017-08-05 DIAGNOSIS — Z66 Do not resuscitate: Secondary | ICD-10-CM | POA: Diagnosis present

## 2017-08-05 DIAGNOSIS — F419 Anxiety disorder, unspecified: Secondary | ICD-10-CM | POA: Diagnosis present

## 2017-08-05 DIAGNOSIS — I2510 Atherosclerotic heart disease of native coronary artery without angina pectoris: Secondary | ICD-10-CM | POA: Diagnosis not present

## 2017-08-05 DIAGNOSIS — B9620 Unspecified Escherichia coli [E. coli] as the cause of diseases classified elsewhere: Secondary | ICD-10-CM | POA: Diagnosis present

## 2017-08-05 DIAGNOSIS — Z7982 Long term (current) use of aspirin: Secondary | ICD-10-CM

## 2017-08-05 DIAGNOSIS — Z86718 Personal history of other venous thrombosis and embolism: Secondary | ICD-10-CM | POA: Diagnosis not present

## 2017-08-05 DIAGNOSIS — Z794 Long term (current) use of insulin: Secondary | ICD-10-CM

## 2017-08-05 DIAGNOSIS — N3000 Acute cystitis without hematuria: Secondary | ICD-10-CM | POA: Diagnosis present

## 2017-08-05 DIAGNOSIS — E11649 Type 2 diabetes mellitus with hypoglycemia without coma: Secondary | ICD-10-CM | POA: Diagnosis present

## 2017-08-05 DIAGNOSIS — G20 Parkinson's disease: Secondary | ICD-10-CM | POA: Diagnosis present

## 2017-08-05 DIAGNOSIS — I272 Pulmonary hypertension, unspecified: Secondary | ICD-10-CM | POA: Diagnosis present

## 2017-08-05 DIAGNOSIS — Z681 Body mass index (BMI) 19 or less, adult: Secondary | ICD-10-CM | POA: Diagnosis not present

## 2017-08-05 DIAGNOSIS — E1122 Type 2 diabetes mellitus with diabetic chronic kidney disease: Secondary | ICD-10-CM | POA: Diagnosis not present

## 2017-08-05 DIAGNOSIS — N189 Chronic kidney disease, unspecified: Secondary | ICD-10-CM

## 2017-08-05 DIAGNOSIS — R441 Visual hallucinations: Secondary | ICD-10-CM | POA: Diagnosis present

## 2017-08-05 DIAGNOSIS — I248 Other forms of acute ischemic heart disease: Secondary | ICD-10-CM | POA: Diagnosis not present

## 2017-08-05 LAB — COMPREHENSIVE METABOLIC PANEL
ALBUMIN: 3.7 g/dL (ref 3.5–5.0)
ALK PHOS: 45 U/L (ref 38–126)
ALT: <5 U/L — ABNORMAL LOW (ref 14–54)
ANION GAP: 14 (ref 5–15)
AST: 11 U/L — AB (ref 15–41)
BUN: 44 mg/dL — AB (ref 6–20)
CO2: 21 mmol/L — AB (ref 22–32)
Calcium: 8.9 mg/dL (ref 8.9–10.3)
Chloride: 103 mmol/L (ref 101–111)
Creatinine, Ser: 2.09 mg/dL — ABNORMAL HIGH (ref 0.44–1.00)
GFR calc Af Amer: 25 mL/min — ABNORMAL LOW (ref >60)
GFR calc non Af Amer: 22 mL/min — ABNORMAL LOW (ref >60)
GLUCOSE: 156 mg/dL — AB (ref 65–99)
POTASSIUM: 4.6 mmol/L (ref 3.5–5.1)
SODIUM: 138 mmol/L (ref 135–145)
Total Bilirubin: 2.2 mg/dL — ABNORMAL HIGH (ref 0.3–1.2)
Total Protein: 6.1 g/dL — ABNORMAL LOW (ref 6.5–8.1)

## 2017-08-05 LAB — URINALYSIS, COMPLETE (UACMP) WITH MICROSCOPIC
Bilirubin Urine: NEGATIVE
Glucose, UA: NEGATIVE mg/dL
Ketones, ur: 5 mg/dL — AB
Nitrite: NEGATIVE
Specific Gravity, Urine: 1.019 (ref 1.005–1.030)
pH: 5 (ref 5.0–8.0)

## 2017-08-05 LAB — TROPONIN I
TROPONIN I: 0.03 ng/mL — AB (ref <0.03)
TROPONIN I: 0.03 ng/mL — AB (ref <0.03)
TROPONIN I: 0.03 ng/mL — AB (ref <0.03)

## 2017-08-05 LAB — CBC
HEMATOCRIT: 37.1 % (ref 35.0–47.0)
HEMOGLOBIN: 12 g/dL (ref 12.0–16.0)
MCH: 28.3 pg (ref 26.0–34.0)
MCHC: 32.2 g/dL (ref 32.0–36.0)
MCV: 88 fL (ref 80.0–100.0)
Platelets: 162 10*3/uL (ref 150–440)
RBC: 4.22 MIL/uL (ref 3.80–5.20)
RDW: 16.5 % — ABNORMAL HIGH (ref 11.5–14.5)
WBC: 4.5 10*3/uL (ref 3.6–11.0)

## 2017-08-05 LAB — HEMOGLOBIN A1C
HEMOGLOBIN A1C: 6.8 % — AB (ref 4.8–5.6)
MEAN PLASMA GLUCOSE: 148.46 mg/dL

## 2017-08-05 LAB — GLUCOSE, CAPILLARY
Glucose-Capillary: 165 mg/dL — ABNORMAL HIGH (ref 65–99)
Glucose-Capillary: 135 mg/dL — ABNORMAL HIGH (ref 65–99)

## 2017-08-05 LAB — LIPASE, BLOOD: Lipase: 17 U/L (ref 11–51)

## 2017-08-05 MED ORDER — ASPIRIN EC 81 MG PO TBEC
81.0000 mg | DELAYED_RELEASE_TABLET | ORAL | Status: DC
  Administered 2017-08-05 – 2017-08-07 (×2): 81 mg via ORAL
  Filled 2017-08-05 (×4): qty 1

## 2017-08-05 MED ORDER — CARBIDOPA-LEVODOPA ER 25-100 MG PO TBCR
2.0000 | EXTENDED_RELEASE_TABLET | Freq: Four times a day (QID) | ORAL | Status: DC
  Administered 2017-08-05 – 2017-08-08 (×14): 2 via ORAL
  Filled 2017-08-05 (×18): qty 2

## 2017-08-05 MED ORDER — DEXTROSE 5 % IV SOLN
1.0000 g | INTRAVENOUS | Status: DC
  Administered 2017-08-06 – 2017-08-08 (×3): 1 g via INTRAVENOUS
  Filled 2017-08-05 (×4): qty 10

## 2017-08-05 MED ORDER — ONDANSETRON HCL 4 MG/2ML IJ SOLN
INTRAMUSCULAR | Status: AC
  Administered 2017-08-05: 4 mg via INTRAVENOUS
  Filled 2017-08-05: qty 2

## 2017-08-05 MED ORDER — TRAMADOL HCL 50 MG PO TABS: 50.0000 mg | ORAL_TABLET | Freq: Every day | ORAL | Status: DC | PRN

## 2017-08-05 MED ORDER — ONDANSETRON HCL 4 MG PO TABS: 4.0000 mg | ORAL_TABLET | Freq: Four times a day (QID) | ORAL | Status: DC | PRN

## 2017-08-05 MED ORDER — GABAPENTIN 300 MG PO CAPS
300.0000 mg | ORAL_CAPSULE | Freq: Two times a day (BID) | ORAL | Status: DC
  Administered 2017-08-05 – 2017-08-08 (×7): 300 mg via ORAL
  Filled 2017-08-05 (×7): qty 1

## 2017-08-05 MED ORDER — COQ10 50 MG PO CAPS: 50.0000 mg | ORAL_CAPSULE | Freq: Every day | ORAL | Status: DC

## 2017-08-05 MED ORDER — NEPRO/CARBSTEADY PO LIQD
237.0000 mL | Freq: Three times a day (TID) | ORAL | Status: DC
  Administered 2017-08-05 – 2017-08-08 (×8): 237 mL via ORAL

## 2017-08-05 MED ORDER — INSULIN DETEMIR 100 UNIT/ML ~~LOC~~ SOLN
7.0000 [IU] | Freq: Every day | SUBCUTANEOUS | Status: DC
  Administered 2017-08-06: 7 [IU] via SUBCUTANEOUS
  Filled 2017-08-05 (×3): qty 0.07

## 2017-08-05 MED ORDER — ISOSORBIDE MONONITRATE ER 30 MG PO TB24
60.0000 mg | ORAL_TABLET | Freq: Every day | ORAL | Status: DC
  Administered 2017-08-05 – 2017-08-07 (×3): 60 mg via ORAL
  Filled 2017-08-05 (×3): qty 2

## 2017-08-05 MED ORDER — SACCHAROMYCES BOULARDII 250 MG PO CAPS
250.0000 mg | ORAL_CAPSULE | Freq: Every day | ORAL | Status: DC
  Administered 2017-08-05 – 2017-08-08 (×4): 250 mg via ORAL
  Filled 2017-08-05 (×4): qty 1

## 2017-08-05 MED ORDER — CEFTRIAXONE SODIUM IN DEXTROSE 20 MG/ML IV SOLN
1.0000 g | Freq: Once | INTRAVENOUS | Status: AC
  Administered 2017-08-05: 1 g via INTRAVENOUS
  Filled 2017-08-05: qty 50

## 2017-08-05 MED ORDER — NITROGLYCERIN 0.4 MG SL SUBL: 0.4000 mg | SUBLINGUAL_TABLET | SUBLINGUAL | Status: DC | PRN

## 2017-08-05 MED ORDER — HYDRALAZINE HCL 20 MG/ML IJ SOLN
10.0000 mg | Freq: Four times a day (QID) | INTRAMUSCULAR | Status: DC | PRN
  Administered 2017-08-05: 10 mg via INTRAVENOUS

## 2017-08-05 MED ORDER — ATORVASTATIN CALCIUM 20 MG PO TABS
20.0000 mg | ORAL_TABLET | Freq: Every day | ORAL | Status: DC
  Administered 2017-08-05 – 2017-08-07 (×3): 20 mg via ORAL
  Filled 2017-08-05 (×3): qty 1

## 2017-08-05 MED ORDER — ONDANSETRON 4 MG PO TBDP
4.0000 mg | ORAL_TABLET | Freq: Three times a day (TID) | ORAL | Status: DC | PRN
  Filled 2017-08-05: qty 1

## 2017-08-05 MED ORDER — DOXEPIN HCL 10 MG PO CAPS
10.0000 mg | ORAL_CAPSULE | Freq: Every day | ORAL | Status: DC
  Administered 2017-08-05 – 2017-08-07 (×3): 10 mg via ORAL
  Filled 2017-08-05 (×4): qty 1

## 2017-08-05 MED ORDER — ONDANSETRON HCL 4 MG/2ML IJ SOLN: 4.0000 mg | Freq: Four times a day (QID) | INTRAMUSCULAR | Status: DC | PRN

## 2017-08-05 MED ORDER — ESCITALOPRAM OXALATE 20 MG PO TABS
20.0000 mg | ORAL_TABLET | Freq: Every day | ORAL | Status: DC
  Administered 2017-08-05 – 2017-08-08 (×4): 20 mg via ORAL
  Filled 2017-08-05 (×4): qty 1

## 2017-08-05 MED ORDER — LORATADINE 10 MG PO TABS: 5.0000 mg | ORAL_TABLET | Freq: Every day | ORAL | Status: DC | PRN

## 2017-08-05 MED ORDER — PROMETHAZINE HCL 25 MG PO TABS
25.0000 mg | ORAL_TABLET | Freq: Four times a day (QID) | ORAL | Status: DC | PRN
  Filled 2017-08-05: qty 1

## 2017-08-05 MED ORDER — LORAZEPAM 0.5 MG PO TABS
0.5000 mg | ORAL_TABLET | Freq: Two times a day (BID) | ORAL | Status: DC | PRN
  Administered 2017-08-05 – 2017-08-06 (×2): 0.5 mg via ORAL
  Filled 2017-08-05 (×2): qty 1

## 2017-08-05 MED ORDER — ENOXAPARIN SODIUM 30 MG/0.3ML ~~LOC~~ SOLN
30.0000 mg | SUBCUTANEOUS | Status: DC
  Administered 2017-08-05 – 2017-08-08 (×4): 30 mg via SUBCUTANEOUS
  Filled 2017-08-05 (×4): qty 0.3

## 2017-08-05 MED ORDER — METOPROLOL TARTRATE 50 MG PO TABS
100.0000 mg | ORAL_TABLET | Freq: Two times a day (BID) | ORAL | Status: DC
  Administered 2017-08-05 – 2017-08-07 (×5): 100 mg via ORAL
  Filled 2017-08-05 (×5): qty 2

## 2017-08-05 MED ORDER — POLYETHYLENE GLYCOL 3350 17 G PO PACK: 17.0000 g | PACK | Freq: Every day | ORAL | Status: DC | PRN

## 2017-08-05 MED ORDER — HYDRALAZINE HCL 10 MG PO TABS
10.0000 mg | ORAL_TABLET | Freq: Two times a day (BID) | ORAL | Status: DC
  Administered 2017-08-06 – 2017-08-07 (×3): 10 mg via ORAL
  Filled 2017-08-05 (×6): qty 1

## 2017-08-05 MED ORDER — ACETAMINOPHEN 325 MG PO TABS: 650.0000 mg | ORAL_TABLET | Freq: Four times a day (QID) | ORAL | Status: DC | PRN

## 2017-08-05 MED ORDER — BISACODYL 5 MG PO TBEC: 5.0000 mg | DELAYED_RELEASE_TABLET | Freq: Every day | ORAL | Status: DC | PRN

## 2017-08-05 MED ORDER — SODIUM CHLORIDE 0.9 % IV BOLUS (SEPSIS)
500.0000 mL | Freq: Once | INTRAVENOUS | Status: AC
  Administered 2017-08-05: 500 mL via INTRAVENOUS

## 2017-08-05 MED ORDER — MIRABEGRON ER 25 MG PO TB24
25.0000 mg | ORAL_TABLET | Freq: Every day | ORAL | Status: DC
  Administered 2017-08-05 – 2017-08-08 (×4): 25 mg via ORAL
  Filled 2017-08-05 (×4): qty 1

## 2017-08-05 MED ORDER — ACETAMINOPHEN 650 MG RE SUPP: 650.0000 mg | Freq: Four times a day (QID) | RECTAL | Status: DC | PRN

## 2017-08-05 MED ORDER — HYDRALAZINE HCL 20 MG/ML IJ SOLN
INTRAMUSCULAR | Status: AC
  Administered 2017-08-05: 10 mg via INTRAVENOUS
  Filled 2017-08-05: qty 1

## 2017-08-05 MED ORDER — SODIUM CHLORIDE 0.9 % IV SOLN
INTRAVENOUS | Status: DC
  Administered 2017-08-05 – 2017-08-08 (×5): via INTRAVENOUS

## 2017-08-05 MED ORDER — ONDANSETRON HCL 4 MG/2ML IJ SOLN
4.0000 mg | Freq: Once | INTRAMUSCULAR | Status: AC | PRN
  Administered 2017-08-05: 4 mg via INTRAVENOUS

## 2017-08-05 MED ORDER — HYDROCODONE-ACETAMINOPHEN 5-325 MG PO TABS: 1.0000 | ORAL_TABLET | ORAL | Status: DC | PRN

## 2017-08-05 MED ORDER — CRANBERRY 450 MG PO TABS: 450.0000 mg | ORAL_TABLET | Freq: Every day | ORAL | Status: DC

## 2017-08-05 MED ORDER — INSULIN ASPART 100 UNIT/ML ~~LOC~~ SOLN
0.0000 [IU] | Freq: Three times a day (TID) | SUBCUTANEOUS | Status: DC
  Administered 2017-08-05: 2 [IU] via SUBCUTANEOUS
  Administered 2017-08-06 – 2017-08-07 (×3): 1 [IU] via SUBCUTANEOUS
  Administered 2017-08-08: 3 [IU] via SUBCUTANEOUS
  Administered 2017-08-08: 08:00:00 1 [IU] via SUBCUTANEOUS
  Administered 2017-08-08: 12:00:00 3 [IU] via SUBCUTANEOUS
  Filled 2017-08-05 (×7): qty 1

## 2017-08-05 NOTE — ED Triage Notes (Signed)
Pt presents to ED via [facility] from home c/o nausea and fatigue for 3-4 days. Decreased appetite. Denies vomiting/diarrhea. Denies pain. Family states pt has had recent earaches, was prescribed prednisone but stopped d/t hallucinations. Was on dialysis, stopped 3 wks ago per nephrologist.

## 2017-08-05 NOTE — Progress Notes (Signed)
[facility], [place] 08/05/17  Subjective:   Patient known to our practice from previous admissions and outpatient dialysis.  She was recently started on dialysis in July for acute renal failure.  She came off dialysis less than a month ago. She presents today because she has not been feeling well.  She states that she has poor appetite. She has mild midepigastric abdominal pain Urinalysis in the emergency room shows too numerous to count WBCs.  Urine culture is pending.  Objective:  Vital signs in last 24 hours:  Temp:  [97.3 F (36.3 C)] 97.3 F (36.3 C) (10/25 1029) Pulse Rate:  [79] 79 (10/25 1029) Resp:  [18] 18 (10/25 1029) BP: (168)/(118) 168/118 (10/25 1029) SpO2:  [100 %] 100 % (10/25 1029) Weight:  [61.7 kg (136 lb)] 61.7 kg (136 lb) (10/25 1028)  Weight change:  Filed Weights   08/05/17 1028  Weight: 61.7 kg (136 lb)    Intake/Output:   No intake or output data in the 24 hours ending 08/05/17 1250   Physical Exam: General:  Chronically ill-appearing, laying in the bed  HEENT  anicteric, somewhat dry oral mucous membranes  Neck  supple, no masses  Pulm/lungs  normal breathing effort, mild basilar rhonchi  CVS/Heart  tachycardic, no rub  Abdomen:   Soft, nondistended, mild epigastric tenderness  Extremities:  No peripheral edema  Neurologic:  Alert, able to answer questions and follow commands, Parkinson's tremors  Skin:  Decreased turgor          Basic Metabolic Panel:   Recent Labs Lab 08/05/17 1036  NA 138  K 4.6  CL 103  CO2 21*  GLUCOSE 156*  BUN 44*  CREATININE 2.09*  CALCIUM 8.9     CBC:  Recent Labs Lab 08/05/17 1036  WBC 4.5  HGB 12.0  HCT 37.1  MCV 88.0  PLT 162     Lab Results  Component Value Date   HEPBSAG Negative 07/03/2017   HEPBSAB Non Reactive 04/22/2017      Microbiology:  No results found for this or any previous visit (from the past 240 hour(s)).  Coagulation Studies: No  results for input(s): LABPROT, INR in the last 72 hours.  Urinalysis:  Recent Labs  08/05/17 1035  COLORURINE AMBER*  LABSPEC 1.019  PHURINE 5.0  GLUCOSEU NEGATIVE  HGBUR SMALL*  BILIRUBINUR NEGATIVE  KETONESUR 5*  PROTEINUR >=300*  NITRITE NEGATIVE  LEUKOCYTESUR MODERATE*      Imaging: Dg Chest 2 View  Result Date: 08/05/2017 CLINICAL DATA:  Cough, nausea, and malaise for 3-4 days. EXAM: CHEST  2 VIEW COMPARISON:  07/14/2017 FINDINGS: Right jugular dialysis catheter terminates over the mid to lower SVC, unchanged. The cardiac silhouette remains enlarged. The lungs are well inflated without evidence of confluent airspace opacity, edema, or pneumothorax. Small bilateral pleural effusions are similar to the prior study. An IVC filter is partially visualized in the abdomen. There is mild thoracic spondylosis and dextroscoliosis. IMPRESSION: Unchanged small pleural effusions. No evidence of acute airspace disease. Electronically Signed   By: Sebastian Ache M.D.   On: 08/05/2017 12:09     Medications:   . cefTRIAXone (ROCEPHIN)  IV 1 g (08/05/17 1226)      Assessment/ Plan:  76 y.o. female  with coronary artery disease, congestive heart failure, pulmonary hypertension, parkinson's, diabetes mellitus type II, COPD, DVT, atrial fibrillation, hyperlipidemia, anemia, anxiety, patellar fracture,  1.  Acute renal failure 2.  Chronic kidney disease stage IV.  Baseline creatinine  1.71/GFR 28 on July 14, 2017 3.  Diabetes type 2 with chronic kidney disease 4.  Urinary tract infection   Patient did have IV contrast exposure on July 14, 2017 for a CT angiogram Presenting creatinine is 2.09 Agree with treating urinary tract infection Follow labs No acute indication of HD at present    LOS: 0 Edward PlainfieldINGH,Faten Frieson 10/25/201812:50 PM  [facility] [place], [place] [phone]

## 2017-08-05 NOTE — ED Provider Notes (Signed)
[facility] Emergency Department Provider Note  ____________________________________________  Time seen: Approximately 11:27 AM  I have reviewed the triage vital signs and the nursing notes.   HISTORY  Chief Complaint Nausea and Fatigue   HPI Molly Wolf is a 76 y.o. female with a history of CHF (EF 35% on 03/2017), chronic kidney disease, diabetes, coronary artery disease, hypertension, hyperlipidemia, and Parkinson's disease who presents for evaluation of nausea and generalized weakness. Patient reports that she was temporarily on dialysis for 2-3 months. 3 weeks ago she was told by her nephrologist Dr. Wynelle Link that she did not need dialysis anymore. Since then patient has had significant nausea. She tells me that just thinking of food makes her very nauseated. She has had dry heaving and occasional episodes of vomiting. She has been taking ensure and protein drinks but not eating or drinking much otherwise. She has been complaining of progressively worsening generalized weakness and fatigue. Today she was supposed to see her primary care doctor but couldn't get up from the bed due to severe weakness which prompted EMS to be called. No fever, no constipation or diarrhea, no abdominal pain, no shortness of breath, no chest pain, no headaches, no rash, no dysuria. Patient is complaining of a cough productive of white phlegm for the last several days.per patient's daughter she was started on steroids a little bit over a week ago for cough and congestion. After taking the first dose patient started having hallucinations. The daughter stopped the medication immediately however she continues to be hearing sounds like water running or people talking to her that are not there. No visual hallucinations.  Past Medical History:  Diagnosis Date  . Anxiety   . Arthritis   . Chronic combined systolic and diastolic CHF (congestive heart failure) (HCC)    a. 01/2008 Echo ([facility]):  EF > 55%, mod-sev LVH w/ grade 1 DD, biatrial enlargement, mild AS, trace MR/TR; b. EF 45-50%, GR2DD, mild to moderate aortic stenosis, mild aortic insufficieny, mild to moderate mitral regurgitation, mild tricuspid regurgitation, mild biatrial enlargement, and a small pericardial effusion  . CKD (chronic kidney disease), stage IV (HCC)   . Closed patellar sleeve fracture of right knee    a. 01/2017 - conservatively managed.  Marland Kitchen COPD (chronic obstructive pulmonary disease) (HCC)   . Coronary artery disease    a. s/p remote stenting of unknown vessel @ [facility].  . Depression   . Diabetes mellitus with complication (HCC)   . DVT (deep venous thrombosis) (HCC)    a. in the setting of right patellar fracture on Coumadin  . Hyperlipidemia   . Hypertension   . Parkinson disease (HCC)   . Renal insufficiency     Patient Active Problem List   Diagnosis Date Noted  . PNA (pneumonia) 07/02/2017  . DVT (deep venous thrombosis) (HCC) 05/21/2017  . Confusion   . Goals of care, counseling/discussion   . Acute renal failure with acute tubular necrosis superimposed on stage 4 chronic kidney disease (HCC)   . Palliative care encounter   . Coronary artery disease of native artery of native heart with stable angina pectoris (HCC) 03/31/2017  . Persistent atrial fibrillation (HCC) 03/31/2017  . Chronic combined systolic and diastolic heart failure (HCC) 02/26/2017  . HTN (hypertension) 02/26/2017  . Parkinson disease (HCC) 02/26/2017  . Intractable pain 12/27/2016    Past Surgical History:  Procedure Laterality Date  . BACK SURGERY    . CORONARY ANGIOPLASTY WITH STENT PLACEMENT    .  DIALYSIS/PERMA CATHETER INSERTION N/A 04/22/2017   Procedure: Dialysis/Perma Catheter Insertion;  Surgeon: Annice Needy, MD;  Location: [facility] INVASIVE CV LAB;  Service: Cardiovascular;  Laterality: N/A;  . ESOPHAGOGASTRODUODENOSCOPY N/A 04/09/2017   Procedure: ESOPHAGOGASTRODUODENOSCOPY (EGD);  Surgeon: Wyline Mood, MD;   Location: [facility] ENDOSCOPY;  Service: Endoscopy;  Laterality: N/A;  . IVC FILTER INSERTION N/A 04/23/2017   Procedure: IVC Filter Insertion;  Surgeon: Renford Dills, MD;  Location: [facility] INVASIVE CV LAB;  Service: Cardiovascular;  Laterality: N/A;  . NECK SURGERY      Prior to Admission medications   Medication Sig Start Date End Date Taking? Authorizing Provider  acetaminophen (TYLENOL) 325 MG tablet Take 650 mg by mouth every 6 (six) hours as needed for mild pain.    [provider]  aspirin EC 81 MG tablet Take 1 tablet (81 mg total) by mouth daily. Patient taking differently: Take 81 mg by mouth every other day.  05/20/17   End, Cristal Deer, MD  atorvastatin (LIPITOR) 20 MG tablet Take 20 mg by mouth at bedtime.     [provider]  Carbidopa-Levodopa ER (SINEMET CR) 25-100 MG tablet controlled release Take 2 tablets by mouth 4 (four) times daily. Patient taking differently: Take 2 tablets by mouth 5 (five) times daily.  03/05/17   Clarisa Kindred A, FNP  Coenzyme Q10 (COQ10) 50 MG CAPS Take 50 mg by mouth daily.    [provider]  Cranberry 450 MG TABS Take 450 mg by mouth daily.    [provider]  docusate sodium (COLACE) 100 MG capsule Take 1 capsule (100 mg total) by mouth 2 (two) times daily. Patient taking differently: Take 100 mg by mouth 2 (two) times daily as needed for moderate constipation.  04/27/17   Auburn Bilberry, MD  doxepin (SINEQUAN) 10 MG capsule Take 10 mg by mouth at bedtime.    [provider]  escitalopram (LEXAPRO) 20 MG tablet Take 20 mg by mouth daily.    [provider]  gabapentin (NEURONTIN) 300 MG capsule Take 1 capsule (300 mg total) by mouth 2 (two) times daily. 07/15/17   Delma Freeze, FNP  glimepiride (AMARYL) 1 MG tablet Take 1 tablet (1 mg total) by mouth daily. 07/15/17   Delma Freeze, FNP  hydrALAZINE (APRESOLINE) 10 MG tablet Take 1 tablet (10 mg total) by mouth 2 (two) times daily. 04/27/17    Auburn Bilberry, MD  insulin detemir (LEVEMIR) 100 UNIT/ML injection Inject 7 Units into the skin at bedtime.    [provider]  isosorbide mononitrate (IMDUR) 60 MG 24 hr tablet Take 1 tablet (60 mg total) by mouth daily. 07/15/17   Delma Freeze, FNP  loratadine (CLARITIN) 10 MG tablet Take 5 mg by mouth daily as needed for allergies.     [provider]  LORazepam (ATIVAN) 0.5 MG tablet Take 0.5 mg by mouth every 12 (twelve) hours. And as needed for anxiety/pain    [provider]  metoprolol tartrate (LOPRESSOR) 100 MG tablet Take 1 tablet (100 mg total) by mouth 2 (two) times daily. 04/27/17   Auburn Bilberry, MD  mirabegron ER (MYRBETRIQ) 25 MG TB24 tablet Take 25 mg by mouth daily.    [provider]  nitroGLYCERIN (NITROSTAT) 0.4 MG SL tablet Place 0.4 mg under the tongue every 5 (five) minutes as needed for chest pain.    [provider]  Nutritional Supplements (FEEDING SUPPLEMENT, NEPRO CARB STEADY,) LIQD Take 237 mLs by mouth 3 (three)  times daily between meals. 04/27/17   Auburn BilberryPatel, Shreyang, MD  Omega-3 Fatty Acids (FISH OIL) 1000 MG CAPS Take 1,000 mg by mouth daily.    [provider]  ondansetron (ZOFRAN ODT) 4 MG disintegrating tablet Take 1 tablet (4 mg total) by mouth every 8 (eight) hours as needed for nausea or vomiting. 07/14/17   Emily FilbertWilliams, Jonathan E, MD  polyethylene glycol [facility](MIRALAX / Ethelene HalGLYCOLAX) packet Take 17 g by mouth daily as needed for mild constipation.    [provider]  promethazine (PHENERGAN) 25 MG tablet Take 25 mg by mouth every 6 (six) hours as needed for nausea or vomiting.    [provider]  saccharomyces boulardii (FLORASTOR) 250 MG capsule Take 250 mg by mouth daily.    [provider]  traMADol (ULTRAM) 50 MG tablet Take 1 tablet (50 mg total) by mouth daily as needed. Patient taking differently: Take 50 mg by mouth daily as needed for moderate pain.  04/27/17   Auburn BilberryPatel, Shreyang, MD     Allergies Prednisone  Family History  Problem Relation Age of Onset  . Cervical cancer Mother   . Kidney failure Father   . CAD Father   . CAD Brother   . Prostate cancer Neg Hx   . Kidney cancer Neg Hx   . Bladder Cancer Neg Hx     Social History Social History  Substance Use Topics  . Smoking status: Never Smoker  . Smokeless tobacco: Never Used  . Alcohol use No    Review of Systems  Constitutional: Negative for fever. + generalized weakness Eyes: Negative for visual changes. ENT: Negative for sore throat. Neck: No neck pain  Cardiovascular: Negative for chest pain. Respiratory: Negative for shortness of breath. + cough Gastrointestinal: Negative for abdominal pain, vomiting or diarrhea. + nausea and dry heaves Genitourinary: Negative for dysuria. Musculoskeletal: Negative for back pain. Skin: Negative for rash. Neurological: Negative for headaches, weakness or numbness. Psych: No SI or HI. + hallucinations  ____________________________________________   PHYSICAL EXAM:  VITAL SIGNS: ED Triage Vitals  Enc Vitals Group     BP 08/05/17 1029 (!) 168/118     Pulse Rate 08/05/17 1029 79     Resp 08/05/17 1029 18     Temp 08/05/17 1029 (!) 97.3 F (36.3 C)     Temp Source 08/05/17 1029 Oral     SpO2 08/05/17 1029 100 %     Weight 08/05/17 1028 136 lb (61.7 kg)     Height 08/05/17 1028 5' (1.524 m)     Head Circumference --      Peak Flow --      Pain Score --      Pain Loc --      Pain Edu? --      Excl. in GC? --     Constitutional: Alert and oriented x 2, parkinson's tremor.  HEENT:      Head: Normocephalic and atraumatic.         Eyes: Conjunctivae are normal. Sclera is non-icteric.       Mouth/Throat: Mucous membranes are dry.       Neck: Supple with no signs of meningismus. Cardiovascular: Regular rate and rhythm. No murmurs, gallops, or rubs. 2+ symmetrical distal pulses are present in all extremities. No JVD. Respiratory: Normal  respiratory effort. Lungs are clear to auscultation bilaterally. No wheezes, crackles, or rhonchi.  Gastrointestinal: Soft, non tender, and non distended with positive bowel sounds. No rebound or guarding. Musculoskeletal: Nontender with normal  range of motion in all extremities. No edema, cyanosis, or erythema of extremities. Neurologic: Normal speech and language. Face is symmetric. Moving all extremities. No gross focal neurologic deficits are appreciated. Skin: Skin is warm, dry and intact. No rash noted. Psychiatric: Mood and affect are normal. Speech and behavior are normal.  ____________________________________________   LABS (all labs ordered are listed, but only abnormal results are displayed)  Labs Reviewed  COMPREHENSIVE METABOLIC PANEL - Abnormal; Notable for the following:       Result Value   CO2 21 (*)    Glucose, Bld 156 (*)    BUN 44 (*)    Creatinine, Ser 2.09 (*)    Total Protein 6.1 (*)    AST 11 (*)    ALT <5 (*)    Total Bilirubin 2.2 (*)    GFR calc non Af Amer 22 (*)    GFR calc Af Amer 25 (*)    All other components within normal limits  CBC - Abnormal; Notable for the following:    RDW 16.5 (*)    All other components within normal limits  URINALYSIS, COMPLETE (UACMP) WITH MICROSCOPIC - Abnormal; Notable for the following:    Color, Urine AMBER (*)    APPearance HAZY (*)    Hgb urine dipstick SMALL (*)    Ketones, ur 5 (*)    Protein, ur >=300 (*)    Leukocytes, UA MODERATE (*)    Bacteria, UA MANY (*)    Squamous Epithelial / LPF 0-5 (*)    All other components within normal limits  URINE CULTURE  LIPASE, BLOOD  TROPONIN I   ____________________________________________  EKG  ED ECG REPORT I, Nita Sickle, the attending physician, personally viewed and interpreted this ECG.  Atrial fibrillation, rate of 100, prolonged QTC, normal axis, no ST elevations or depressions. unchanged when compared to prior from September  2018 ____________________________________________  RADIOLOGY  CXR:  Unchanged small pleural effusions. No evidence of acute airspace disease. ____________________________________________   PROCEDURES  Procedure(s) performed: None Procedures Critical Care performed:  None ____________________________________________   INITIAL IMPRESSION / ASSESSMENT AND PLAN / ED COURSE  76 y.o. female with a history of CHF (EF 35% on 03/2017), chronic kidney disease, diabetes, coronary artery disease, hypertension, hyperlipidemia, and Parkinson's disease who presents for evaluation of nausea and generalized weakness, nausea, decreased PO intake, and auditory hallucinations. onset of symptoms seems to coincide with patient being taken off dialysis and also being placed on steroids for cough and congestion. She hasn't taken steroids for over a week however her symptoms are any worse instead of improving. We'll check basic blood work for any signs of dehydration, acute kidney injury, uremia, infection. We'll get a chest x-ray and a urinalysis. We'll give gentle hydration and Zofran.    _________________________ 12:33 PM on 08/05/2017 -----------------------------------------  Labs and urinalysis concerning for urinary tract infection, worsening creatinine, and uremia. Patient given IVF and IV rocephin. Discussed with Dr. Thedore Mins, nephrology who will evaluate patient inpatient. Will admit to Hospitalist   As part of my medical decision making, I reviewed the following data within the electronic MEDICAL RECORD NUMBER History obtained from family, Nursing notes reviewed and incorporated, Labs reviewed , EKG interpreted , Old EKG reviewed, Old chart reviewed, Radiograph reviewed , Discussed with admitting physician Dr. Juliene Pina, A consult was requested and obtained from this/these consultant(s) nephrology, Notes from prior ED visits and [place] Controlled Substance Database    Pertinent labs & imaging results that were  available during  my care of the patient were reviewed by me and considered in my medical decision making (see chart for details).    ____________________________________________   FINAL CLINICAL IMPRESSION(S) / ED DIAGNOSES  Final diagnoses:  Acute cystitis without hematuria  Delirium  Acute renal failure superimposed on chronic kidney disease, unspecified CKD stage, unspecified acute renal failure type (HCC)  Uremia      NEW MEDICATIONS STARTED DURING THIS VISIT:  New Prescriptions   No medications on file     Note:  This document was prepared using Dragon voice recognition software and may include unintentional dictation errors.    Nita Sickle, MD 08/05/17 [phone]

## 2017-08-05 NOTE — Progress Notes (Signed)
Pharmacy Antibiotic Note  Renella CunasJudy Skiver is a 76 y.o. female admitted on 08/05/2017 with UTI.  Pharmacy has been consulted for Ceftriaxone dosing.  Plan: Patient received Ceftriaxone 1 gram IV x 1 in ER. Will continue with Ceftriaxone 1 gram IV Q24h.    Height: 5\' 7"  (170.2 cm) Weight: 118 lb 1.6 oz (53.6 kg) IBW/kg (Calculated) : 61.6  Temp (24hrs), Avg:97.5 F (36.4 C), Min:97.3 F (36.3 C), Max:97.6 F (36.4 C)   Recent Labs Lab 08/05/17 1036  WBC 4.5  CREATININE 2.09*    Estimated Creatinine Clearance: 19.1 mL/min (A) (by C-G formula based on SCr of 2.09 mg/dL (H)).    Allergies  Allergen Reactions  . Prednisone Other (See Comments)    halluciantions    Antimicrobials this admission: CTX 10/25 >>       >>    Dose adjustments this admission:    Microbiology results:   BCx:   10/25 UCx: pending    Sputum:      MRSA PCR:    Thank you for allowing pharmacy to be a part of this patient's care.  Navy Rothschild A 08/05/2017 2:42 PM

## 2017-08-05 NOTE — H&P (Signed)
[facility] [place] [facility]   PATIENT NAME: Molly Wolf    MR#:  [id number]  DATE OF BIRTH:  11-12-1940  DATE OF ADMISSION:  08/05/2017  PRIMARY CARE PHYSICIAN: Lauro Regulus, MD   REQUESTING/REFERRING PHYSICIAN:dr veronese  CHIEF COMPLAINT:   Weakness and hallucinations HISTORY OF PRESENT ILLNESS:  Molly Wolf  is a 76 y.o. female with a known history of chronic systolic heart failure ejection fraction 35% by echocardiogram June 2018, chronic kidney disease stage IV, coronary artery disease and Parkinson's who presents with over a week of auditory and visual hallucinations along with generalized weakness and adult failure to thrive. Patient was on dialysis for approximately 2-3 months. 3 weeks ago she was told by her nephrologist that she did not need dialysis anymore. Since that time patient has had increasing hallucinations, nausea and adult failure to thrive. She denies lower extremity edema or shortness of breath. She denies fever, diarrhea, constipation or abdominal pain.  Her daughter who is at bedside reports that she was given prednisone about a week ago for acute bronchitis and since that time she has had auditory and visual hallucinations. She is found to have UTI here in the ED. It does not appear the patient is on prednisone any longer.  PAST MEDICAL HISTORY:   Past Medical History:  Diagnosis Date  . Anxiety   . Arthritis   . Chronic combined systolic and diastolic CHF (congestive heart failure) (HCC)    a. 01/2008 Echo ([facility]): EF > 55%, mod-sev LVH w/ grade 1 DD, biatrial enlargement, mild AS, trace MR/TR; b. EF 45-50%, GR2DD, mild to moderate aortic stenosis, mild aortic insufficieny, mild to moderate mitral regurgitation, mild tricuspid regurgitation, mild biatrial enlargement, and a small pericardial effusion  . CKD (chronic kidney disease), stage IV (HCC)   . Closed patellar sleeve fracture of right knee    a. 01/2017 - conservatively  managed.  Marland Kitchen COPD (chronic obstructive pulmonary disease) (HCC)   . Coronary artery disease    a. s/p remote stenting of unknown vessel @ [facility].  . Depression   . Diabetes mellitus with complication (HCC)   . DVT (deep venous thrombosis) (HCC)    a. in the setting of right patellar fracture on Coumadin  . Hyperlipidemia   . Hypertension   . Parkinson disease (HCC)   . Renal insufficiency    Atrial fibrillation, chronic PAST SURGICAL HISTORY:   Past Surgical History:  Procedure Laterality Date  . BACK SURGERY    . CORONARY ANGIOPLASTY WITH STENT PLACEMENT    . DIALYSIS/PERMA CATHETER INSERTION N/A 04/22/2017   Procedure: Dialysis/Perma Catheter Insertion;  Surgeon: Annice Needy, MD;  Location: [facility] INVASIVE CV LAB;  Service: Cardiovascular;  Laterality: N/A;  . ESOPHAGOGASTRODUODENOSCOPY N/A 04/09/2017   Procedure: ESOPHAGOGASTRODUODENOSCOPY (EGD);  Surgeon: Wyline Mood, MD;  Location: [facility] ENDOSCOPY;  Service: Endoscopy;  Laterality: N/A;  . IVC FILTER INSERTION N/A 04/23/2017   Procedure: IVC Filter Insertion;  Surgeon: Renford Dills, MD;  Location: [facility] INVASIVE CV LAB;  Service: Cardiovascular;  Laterality: N/A;  . NECK SURGERY      SOCIAL HISTORY:   Social History  Substance Use Topics  . Smoking status: Never Smoker  . Smokeless tobacco: Never Used  . Alcohol use No    FAMILY HISTORY:   Family History  Problem Relation Age of Onset  . Cervical cancer Mother   . Kidney failure Father   . CAD Father   . CAD Brother   .  Prostate cancer Neg Hx   . Kidney cancer Neg Hx   . Bladder Cancer Neg Hx     DRUG ALLERGIES:   Allergies  Allergen Reactions  . Prednisone Other (See Comments)    halluciantions    REVIEW OF SYSTEMS:   Review of Systems  Constitutional: Positive for malaise/fatigue and weight loss. Negative for chills and fever.  HENT: Negative.  Negative for ear discharge, ear pain, hearing loss, nosebleeds and sore throat.   Eyes: Negative.   Negative for blurred vision and pain.  Respiratory: Positive for cough. Negative for hemoptysis, shortness of breath and wheezing.   Cardiovascular: Negative.  Negative for chest pain, palpitations and leg swelling.  Gastrointestinal: Negative.  Negative for abdominal pain, blood in stool, diarrhea, nausea and vomiting.  Genitourinary: Positive for frequency. Negative for dysuria.  Musculoskeletal: Negative.  Negative for back pain.  Skin: Negative.   Neurological: Positive for tremors and weakness. Negative for dizziness, speech change, focal weakness, seizures and headaches.  Endo/Heme/Allergies: Negative.  Does not bruise/bleed easily.  Psychiatric/Behavioral: Positive for hallucinations. Negative for depression and suicidal ideas.    MEDICATIONS AT HOME:   Prior to Admission medications   Medication Sig Start Date End Date Taking? Authorizing Provider  acetaminophen (TYLENOL) 325 MG tablet Take 650 mg by mouth every 6 (six) hours as needed for mild pain.    [provider]  aspirin EC 81 MG tablet Take 1 tablet (81 mg total) by mouth daily. Patient taking differently: Take 81 mg by mouth every other day.  05/20/17   End, Cristal Deer, MD  atorvastatin (LIPITOR) 20 MG tablet Take 20 mg by mouth at bedtime.     [provider]  Carbidopa-Levodopa ER (SINEMET CR) 25-100 MG tablet controlled release Take 2 tablets by mouth 4 (four) times daily. Patient taking differently: Take 2 tablets by mouth 5 (five) times daily.  03/05/17   Clarisa Kindred A, FNP  Coenzyme Q10 (COQ10) 50 MG CAPS Take 50 mg by mouth daily.    [provider]  Cranberry 450 MG TABS Take 450 mg by mouth daily.    [provider]  docusate sodium (COLACE) 100 MG capsule Take 1 capsule (100 mg total) by mouth 2 (two) times daily. Patient taking differently: Take 100 mg by mouth 2 (two) times daily as needed for moderate constipation.  04/27/17   Auburn Bilberry, MD  doxepin (SINEQUAN) 10 MG  capsule Take 10 mg by mouth at bedtime.    [provider]  escitalopram (LEXAPRO) 20 MG tablet Take 20 mg by mouth daily.    [provider]  gabapentin (NEURONTIN) 300 MG capsule Take 1 capsule (300 mg total) by mouth 2 (two) times daily. 07/15/17   Delma Freeze, FNP  glimepiride (AMARYL) 1 MG tablet Take 1 tablet (1 mg total) by mouth daily. 07/15/17   Delma Freeze, FNP  hydrALAZINE (APRESOLINE) 10 MG tablet Take 1 tablet (10 mg total) by mouth 2 (two) times daily. 04/27/17   Auburn Bilberry, MD  insulin detemir (LEVEMIR) 100 UNIT/ML injection Inject 7 Units into the skin at bedtime.    [provider]  isosorbide mononitrate (IMDUR) 60 MG 24 hr tablet Take 1 tablet (60 mg total) by mouth daily. 07/15/17   Delma Freeze, FNP  loratadine (CLARITIN) 10 MG tablet Take 5 mg by mouth daily as needed for allergies.     [provider]  LORazepam (ATIVAN) 0.5 MG tablet Take 0.5 mg by mouth every 12 (  twelve) hours. And as needed for anxiety/pain    [provider]  metoprolol tartrate (LOPRESSOR) 100 MG tablet Take 1 tablet (100 mg total) by mouth 2 (two) times daily. 04/27/17   Auburn Bilberry, MD  mirabegron ER (MYRBETRIQ) 25 MG TB24 tablet Take 25 mg by mouth daily.    [provider]  nitroGLYCERIN (NITROSTAT) 0.4 MG SL tablet Place 0.4 mg under the tongue every 5 (five) minutes as needed for chest pain.    [provider]  Nutritional Supplements (FEEDING SUPPLEMENT, NEPRO CARB STEADY,) LIQD Take 237 mLs by mouth 3 (three) times daily between meals. 04/27/17   Auburn Bilberry, MD  Omega-3 Fatty Acids (FISH OIL) 1000 MG CAPS Take 1,000 mg by mouth daily.    [provider]  ondansetron (ZOFRAN ODT) 4 MG disintegrating tablet Take 1 tablet (4 mg total) by mouth every 8 (eight) hours as needed for nausea or vomiting. 07/14/17   Emily Filbert, MD  polyethylene glycol [facility] Ethelene Hal) packet Take 17 g by mouth daily as  needed for mild constipation.    [provider]  promethazine (PHENERGAN) 25 MG tablet Take 25 mg by mouth every 6 (six) hours as needed for nausea or vomiting.    [provider]  saccharomyces boulardii (FLORASTOR) 250 MG capsule Take 250 mg by mouth daily.    [provider]  traMADol (ULTRAM) 50 MG tablet Take 1 tablet (50 mg total) by mouth daily as needed. Patient taking differently: Take 50 mg by mouth daily as needed for moderate pain.  04/27/17   Auburn Bilberry, MD      VITAL SIGNS:  Blood pressure (!) 168/118, pulse 79, temperature (!) 97.3 F (36.3 C), temperature source Oral, resp. rate 18, height 5' (1.524 m), weight 61.7 kg (136 lb), SpO2 100 %.  PHYSICAL EXAMINATION:   Physical Exam  Constitutional: She is oriented to person, place, and time and well-developed, well-nourished, and in no distress. No distress.  HENT:  Head: Normocephalic.  Eyes: No scleral icterus.  Neck: Normal range of motion. Neck supple. No JVD present. No tracheal deviation present.  Cardiovascular: Normal rate.  Exam reveals no gallop and no friction rub.   Murmur heard. Irregular, irregular  Pulmonary/Chest: Effort normal and breath sounds normal. No respiratory distress. She has no wheezes. She has no rales. She exhibits no tenderness.  Abdominal: Soft. Bowel sounds are normal. She exhibits no distension and no mass. There is no tenderness. There is no rebound and no guarding.  Musculoskeletal: Normal range of motion. She exhibits no edema.  She has tremors from her Parkinson's  Neurological: She is alert and oriented to person, place, and time.  She is not exhibiting any signs of hallucinations at this time. She is oriented 3.  Skin: Skin is warm. No rash noted. No erythema.  Psychiatric: Affect and judgment normal.      LABORATORY PANEL:   CBC  Recent Labs Lab 08/05/17 1036  WBC 4.5  HGB 12.0  HCT 37.1  PLT 162    ------------------------------------------------------------------------------------------------------------------  Chemistries   Recent Labs Lab 08/05/17 1036  NA 138  K 4.6  CL 103  CO2 21*  GLUCOSE 156*  BUN 44*  CREATININE 2.09*  CALCIUM 8.9  AST 11*  ALT <5*  ALKPHOS 45  BILITOT 2.2*   ------------------------------------------------------------------------------------------------------------------  Cardiac Enzymes  Recent Labs Lab 08/05/17 1036  TROPONINI 0.03*   ------------------------------------------------------------------------------------------------------------------  RADIOLOGY:  Dg Chest 2 View  Result Date: 08/05/2017 CLINICAL DATA:  Cough, nausea, and malaise for 3-4 days. EXAM: CHEST  2 VIEW COMPARISON:  07/14/2017 FINDINGS: Right jugular dialysis catheter terminates over the mid to lower SVC, unchanged. The cardiac silhouette remains enlarged. The lungs are well inflated without evidence of confluent airspace opacity, edema, or pneumothorax. Small bilateral pleural effusions are similar to the prior study. An IVC filter is partially visualized in the abdomen. There is mild thoracic spondylosis and dextroscoliosis. IMPRESSION: Unchanged small pleural effusions. No evidence of acute airspace disease. Electronically Signed   By: Sebastian AcheAllen  Grady M.D.   On: 08/05/2017 12:09    EKG:   Atrial fibrillation without ST elevation or depression  IMPRESSION AND PLAN:   76 year old female with history of Parkinson's disease, CAD chronic kidney disease with history of hemodialysis with recent discontinuation of dialysis, persistent, chronic atrial fibrillation who presents with generalized weakness and hallucinations  1. Acute kidney injury on chronic kidney disease: Patient's symptoms of hallucinations, nausea and decreased appetite may be due to UTI or may be due to need for dialysis. Case discussed with nephrologist He will order 24-hour urine collection and  monitor creatinine Start gentle IV fluid hydration Hold nephrotoxic medications BMP for a.m.  2. Auditory and visual hallucinations: This was started after patient started on prednisone She is no longer on this Treat UTI and monitor hallucinations.   3. UTI: Continue Rocephin and follow up on urine culture  4. 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 Chronic atrial fibrillation complicated by rectus sheath hematoma while on warfarin: Heart rate currently controlled Continue aspirin [facility]  6. Parkinson's disease: Continue Sinemet and doxepin  7. CAD: Continue aspirin and atorvastatin Continue metoprolol and isosorbide  8. History of DVT: Status post IVC filter  9. Generalized weakness: Physical therapy consultation requested  10. Elevated troponin: Likely due to demand ischemia Continue to trend troponins.  11. Diabetes: Continue Levemir with sliding scale Hold oral medications due to decreased appetite   All the records are reviewed and case discussed with ED provider. Management plans discussed with the patient and she is in agreement  CODE STATUS: DO NOT RESUSCITATE   TOTAL TIME TAKING CARE OF THIS PATIENT: 45 minutes.    Latrisha Coiro M.D on 08/05/2017 at 12:36 PM  Between 7am to 6pm - Pager - [phone]  After 6pm go to [email or web address] - Social research officer, [email or web address] [email or web address]  [facility]  Office  [phone]  CC: Primary care physician; Lauro RegulusAnderson, Marshall W, MD

## 2017-08-05 NOTE — Progress Notes (Signed)
PHARMACIST - PHYSICIAN ORDER COMMUNICATION  CONCERNING: P&T Medication Policy on Herbal Medications  DESCRIPTION:  This patient's order for:  coenzyme q10 and cranberry  has been noted.  This product(s) is classified as an "herbal" or natural product. Due to a lack of definitive safety studies or FDA approval, nonstandard manufacturing practices, plus the potential risk of unknown drug-drug interactions while on inpatient medications, the Pharmacy and Therapeutics Committee does not permit the use of "herbal" or natural products of this type within [facility].   ACTION TAKEN: The pharmacy department is unable to verify this order at this time and your patient has been informed of this safety policy. Please reevaluate patient's clinical condition at discharge and address if the herbal or natural product(s) should be resumed at that time.

## 2017-08-06 DIAGNOSIS — R410 Disorientation, unspecified: Secondary | ICD-10-CM | POA: Diagnosis not present

## 2017-08-06 DIAGNOSIS — N179 Acute kidney failure, unspecified: Secondary | ICD-10-CM | POA: Diagnosis not present

## 2017-08-06 LAB — BASIC METABOLIC PANEL
Anion gap: 5 (ref 5–15)
BUN: 48 mg/dL — ABNORMAL HIGH (ref 6–20)
CHLORIDE: 109 mmol/L (ref 101–111)
CO2: 24 mmol/L (ref 22–32)
CREATININE: 2.09 mg/dL — AB (ref 0.44–1.00)
Calcium: 8.1 mg/dL — ABNORMAL LOW (ref 8.9–10.3)
GFR, EST AFRICAN AMERICAN: 25 mL/min — AB (ref >60)
GFR, EST NON AFRICAN AMERICAN: 22 mL/min — AB (ref >60)
Glucose, Bld: 95 mg/dL (ref 65–99)
Potassium: 4.2 mmol/L (ref 3.5–5.1)
SODIUM: 138 mmol/L (ref 135–145)

## 2017-08-06 LAB — CBC
HCT: 28.4 % — ABNORMAL LOW (ref 35.0–47.0)
Hemoglobin: 9.2 g/dL — ABNORMAL LOW (ref 12.0–16.0)
MCH: 28.4 pg (ref 26.0–34.0)
MCHC: 32.6 g/dL (ref 32.0–36.0)
MCV: 87.1 fL (ref 80.0–100.0)
PLATELETS: 118 10*3/uL — AB (ref 150–440)
RBC: 3.26 MIL/uL — AB (ref 3.80–5.20)
RDW: 16.2 % — ABNORMAL HIGH (ref 11.5–14.5)
WBC: 3.7 10*3/uL (ref 3.6–11.0)

## 2017-08-06 LAB — FERRITIN: Ferritin: 294 ng/mL (ref 11–307)

## 2017-08-06 LAB — IRON AND TIBC
IRON: 211 ug/dL — AB (ref 28–170)
Saturation Ratios: 87 % — ABNORMAL HIGH (ref 10.4–31.8)
TIBC: 242 ug/dL — AB (ref 250–450)
UIBC: 31 ug/dL

## 2017-08-06 LAB — GLUCOSE, CAPILLARY
GLUCOSE-CAPILLARY: 75 mg/dL (ref 65–99)
GLUCOSE-CAPILLARY: 128 mg/dL — AB (ref 65–99)
GLUCOSE-CAPILLARY: 70 mg/dL (ref 65–99)
Glucose-Capillary: 107 mg/dL — ABNORMAL HIGH (ref 65–99)

## 2017-08-06 LAB — PHOSPHORUS: Phosphorus: 2.8 mg/dL (ref 2.5–4.6)

## 2017-08-06 LAB — MAGNESIUM: Magnesium: 1.9 mg/dL (ref 1.7–2.4)

## 2017-08-06 LAB — TROPONIN I: TROPONIN I: 0.03 ng/mL — AB (ref <0.03)

## 2017-08-06 MED ORDER — ADULT MULTIVITAMIN W/MINERALS CH: 1.0000 | ORAL_TABLET | ORAL | Status: DC

## 2017-08-06 MED ORDER — RENA-VITE PO TABS
1.0000 | ORAL_TABLET | Freq: Every day | ORAL | Status: DC
  Administered 2017-08-06 – 2017-08-07 (×2): 1 via ORAL
  Filled 2017-08-06 (×3): qty 1

## 2017-08-06 NOTE — Progress Notes (Signed)
[employer] - [place] [facility]                                                                                                                                                                                  Patient Demographics   Molly Wolf, is a 76 y.o. female, DOB - [birth year], ZOX:[id number]  Admit date - 08/05/2017   Admitting Physician Adrian Saran, MD  Outpatient Primary MD for the patient is Lauro Regulus, MD   LOS - 1  Subjective: Pt admitted with weakness and hallucinations Appears little better   Review of Systems:   CONSTITUTIONAL: No documented fever. No fatigue, weakness. No weight gain, no weight loss.  EYES: No blurry or double vision.  ENT: No tinnitus. No postnasal drip. No redness of the oropharynx.  RESPIRATORY: No cough, no wheeze, no hemoptysis. No dyspnea.  CARDIOVASCULAR: No chest pain. No orthopnea. No palpitations. No syncope.  GASTROINTESTINAL: No nausea, no vomiting or diarrhea. No abdominal pain. No melena or hematochezia.  GENITOURINARY: No dysuria or hematuria.  ENDOCRINE: No polyuria or nocturia. No heat or cold intolerance.  HEMATOLOGY: No anemia. No bruising. No bleeding.  INTEGUMENTARY: No rashes. No lesions.  MUSCULOSKELETAL: No arthritis. No swelling. No gout.  NEUROLOGIC: No numbness, tingling, or ataxia. No seizure-type activity.  PSYCHIATRIC: positive hallucination   Vitals:   Vitals:   08/06/17 0424 08/06/17 0500 08/06/17 0800 08/06/17 1342  BP: 126/79  133/83 100/61  Pulse: (!) 101  100 (!) 103  Resp: 16   18  Temp: 98.7 F (37.1 C)   98.2 F (36.8 C)  TempSrc: Oral   Oral  SpO2: 99%  98% 98%  Weight:  118 lb 1.6 oz (53.6 kg)    Height:        Wt Readings from Last 3 Encounters:  08/06/17 118 lb 1.6 oz (53.6 kg)  07/03/17 136 lb 3.9 oz (61.8 kg)  04/08/17 152 lb (68.9 kg)     Intake/Output Summary (Last 24 hours) at 08/06/17 1446 Last data filed at 08/06/17 1007  Gross per 24 hour  Intake               240 ml  Output                0 ml  Net              240 ml    Physical Exam:   GENERAL: Pleasant-appearing in no apparent distress.  HEAD, EYES, EARS, NOSE AND THROAT: Atraumatic, normocephalic. Extraocular muscles are intact. Pupils equal and reactive to light. Sclerae anicteric. No conjunctival injection. No oro-pharyngeal erythema.  NECK: Supple. There is no jugular  venous distention. No bruits, no lymphadenopathy, no thyromegaly.  HEART: Regular rate and rhythm,. No murmurs, no rubs, no clicks.  LUNGS: Clear to auscultation bilaterally. No rales or rhonchi. No wheezes.  ABDOMEN: Soft, flat, nontender, nondistended. Has good bowel sounds. No hepatosplenomegaly appreciated.  EXTREMITIES: No evidence of any cyanosis, clubbing, or peripheral edema.  +2 pedal and radial pulses bilaterally.  NEUROLOGIC: The patient is alert, awake, and oriented x3 with no focal motor or sensory deficits appreciated bilaterally.  SKIN: Moist and warm with no rashes appreciated.  Psych: Not anxious, depressed LN: No inguinal LN enlargement    Antibiotics   Anti-infectives    Start     Dose/Rate Route Frequency Ordered Stop   08/06/17 1000  cefTRIAXone (ROCEPHIN) 1 g in dextrose 5 % 50 mL IVPB     1 g 100 mL/hr over 30 Minutes Intravenous Every 24 hours 08/05/17 1442     08/05/17 1215  cefTRIAXone (ROCEPHIN) 1 g in dextrose 5 % 50 mL IVPB - Premix     1 g 100 mL/hr over 30 Minutes Intravenous  Once 08/05/17 1200 08/05/17 1256      Medications   Scheduled Meds: . aspirin EC  81 mg Oral QODAY  . atorvastatin  20 mg Oral QHS  . Carbidopa-Levodopa ER  2 tablet Oral QID  . doxepin  10 mg Oral QHS  . enoxaparin (LOVENOX) injection  30 mg Subcutaneous Q24H  . escitalopram  20 mg Oral Daily  . feeding supplement (NEPRO CARB STEADY)  237 mL Oral TID BM  . gabapentin  300 mg Oral BID  . hydrALAZINE  10 mg Oral BID  . insulin aspart  0-9 Units Subcutaneous TID WC  . insulin detemir  7 Units  Subcutaneous QHS  . isosorbide mononitrate  60 mg Oral Daily  . metoprolol tartrate  100 mg Oral BID  . mirabegron ER  25 mg Oral Daily  . multivitamin  1 tablet Oral QHS  . [START ON 08/09/2017] multivitamin with minerals  1 tablet Oral Once per day on Mon Thu  . saccharomyces boulardii  250 mg Oral Daily   Continuous Infusions: . sodium chloride 50 mL/hr at 08/06/17 1210  . cefTRIAXone (ROCEPHIN)  IV Stopped (08/06/17 0914)   PRN Meds:.acetaminophen **OR** acetaminophen, acetaminophen, bisacodyl, hydrALAZINE, HYDROcodone-acetaminophen, loratadine, LORazepam, nitroGLYCERIN, ondansetron **OR** ondansetron (ZOFRAN) IV, ondansetron, polyethylene glycol, polyethylene glycol, promethazine, traMADol   Data Review:   Micro Results Recent Results (from the past 240 hour(s))  Urine Culture     Status: Abnormal (Preliminary result)   Collection Time: 08/05/17 10:36 AM  Result Value Ref Range Status   Specimen Description URINE, RANDOM  Final   Special Requests NONE  Final   Culture >=100,000 COLONIES/mL GRAM NEGATIVE RODS (A)  Final   Report Status PENDING  Incomplete    Radiology Reports Dg Chest 2 View  Result Date: 08/05/2017 CLINICAL DATA:  Cough, nausea, and malaise for 3-4 days. EXAM: CHEST  2 VIEW COMPARISON:  07/14/2017 FINDINGS: Right jugular dialysis catheter terminates over the mid to lower SVC, unchanged. The cardiac silhouette remains enlarged. The lungs are well inflated without evidence of confluent airspace opacity, edema, or pneumothorax. Small bilateral pleural effusions are similar to the prior study. An IVC filter is partially visualized in the abdomen. There is mild thoracic spondylosis and dextroscoliosis. IMPRESSION: Unchanged small pleural effusions. No evidence of acute airspace disease. Electronically Signed   By: Sebastian Ache M.D.   On: 08/05/2017 12:09   Dg Chest  2 View  Result Date: 07/14/2017 CLINICAL DATA:  Cough. Increased weakness today. History of COPD and  dialysis. EXAM: CHEST  2 VIEW COMPARISON:  07/02/2017 FINDINGS: Right jugular dialysis catheter terminates over the mid to lower SVC, unchanged. The cardiac silhouette remains enlarged. Small bilateral pleural effusions are stable to slightly improved. The interstitial markings are less prominent than on the prior study. There is improved aeration of the left lung base. No new airspace consolidation, overt edema, or pneumothorax is identified. An IVC filter is partially visualized in the abdomen. Thoracic spondylosis is noted. IMPRESSION: 1. Small pleural effusions, stable to mildly improved. 2. Improved aeration of the left lung base. No definite evidence of pneumonia or edema. Electronically Signed   By: Sebastian AcheAllen  Grady M.D.   On: 07/14/2017 13:43   Ct Angio Abd/pel W And/or Wo Contrast  Result Date: 07/14/2017 CLINICAL DATA:  Postprandial pain this week. Nausea and vomiting. Weight loss. History of rectus sheath hematoma. Elevated INR. EXAM: CTA ABDOMEN AND PELVIS wITHOUT AND WITH CONTRAST TECHNIQUE: Multidetector CT imaging of the abdomen and pelvis was performed using the standard protocol during bolus administration of intravenous contrast. Multiplanar reconstructed images and MIPs were obtained and reviewed to evaluate the vascular anatomy. CONTRAST:  75 cc Isovue 370 COMPARISON:  04/23/2017 FINDINGS: VASCULAR Aorta: Nonaneurysmal and patent with mild scattered atherosclerotic calcifications. Celiac: Patent. Branch vessels are ectatic. No focal saccular aneurysm. Branch vessels are patent. SMA: Patent and ectatic. Renals: Single renal arteries are patent bilaterally. IMA: Atherosclerotic calcification is present at the origin. Significant narrowing may be present at the origin. Beyond the origin, it is patent. Inflow: Bilateral common iliac and external iliac arteries are patent and mildly ectatic. Internal iliac arteries are patent and mildly ectatic. Proximal Outflow: Grossly patent. Review of the MIP  images confirms the above findings. NON-VASCULAR Lower chest: Small pericardial effusion is stable. Three vessel coronary artery calcifications. Moderate aortic valvular calcification. There are calcifications within the chordae tendineae of the left ventricle. Dependent atelectasis. There is a more confluent patchy opacity at the right lung base measuring up to 2.7 cm. Small bilateral pleural effusions have improved. Hepatobiliary: There is low density surrounding the portal triads. Gallbladder sludge is suspected. Pancreas: Atrophic.  No focal mass. Spleen: Unremarkable Adrenals/Urinary Tract: 1.6 cm indeterminate hypodensity in the upper pole of the left kidney demonstrates some internal septal enhancement. 2.2 cm simple cyst in the lower pole of the left kidney. Right kidney is within normal limits. Adrenal glands are within normal limits. Bladder is within normal limits. Stomach/Bowel: No evidence of small-bowel obstruction. No obvious mass in the colon. Stomach is unremarkable. Lymphatic: No abnormal retroperitoneal adenopathy. Reproductive: Uterus and adnexa are within normal limits. Other: Small amount of ascites has improved since the prior study. The rectus sheath hematoma on the right towards the pelvis has improved in size. Today it measures 3.8 x 2.1 cm and previously measured 6.6 x 4.5 cm based on my personal measurements. No new hematoma. IVC filter is in place just below the renal vein inflow. Musculoskeletal: No vertebral compression deformity. Advanced degenerative disc disease in lumbar spine is noted. IMPRESSION: VASCULAR Celiac and SMA are patent. Significant narrowing at the origin of the IMA may be present. These findings result in a low probability for mesenteric ischemia. NON-VASCULAR Small pericardial effusion is stable. Small bilateral pleural effusions have improved 2.7 cm indeterminate patchy opacity at the right lung base. Initial follow-up by chest CT without contrast is recommended in  3 months to confirm  persistence. This recommendation follows the consensus statement: Recommendations for the Management of Subsolid Pulmonary Nodules Detected at CT: A Statement from the Fleischner Society as published in Radiology 2013; 266:304-317. Low density surrounding the portal triads in the liver suggests volume overload. An inflammatory process of the liver cannot be excluded. Correlate with liver function tests. Indeterminate low-density lesion in the left kidney with some central enhancement. Cystic neoplasm is not excluded. MRI of the kidneys is recommended. Improved right lower rectus sheath hematoma. Electronically Signed   By: Jolaine Click M.D.   On: 07/14/2017 14:23     CBC  Recent Labs Lab 08/05/17 1036 08/06/17 0218  WBC 4.5 3.7  HGB 12.0 9.2*  HCT 37.1 28.4*  PLT 162 118*  MCV 88.0 87.1  MCH 28.3 28.4  MCHC 32.2 32.6  RDW 16.5* 16.2*    Chemistries   Recent Labs Lab 08/05/17 1036 08/06/17 0218  NA 138 138  K 4.6 4.2  CL 103 109  CO2 21* 24  GLUCOSE 156* 95  BUN 44* 48*  CREATININE 2.09* 2.09*  CALCIUM 8.9 8.1*  AST 11*  --   ALT <5*  --   ALKPHOS 45  --   BILITOT 2.2*  --    ------------------------------------------------------------------------------------------------------------------ estimated creatinine clearance is 19.1 mL/min (A) (by C-G formula based on SCr of 2.09 mg/dL (H)). ------------------------------------------------------------------------------------------------------------------  Recent Labs  08/05/17 1452  HGBA1C 6.8*   ------------------------------------------------------------------------------------------------------------------ No results for input(s): CHOL, HDL, LDLCALC, TRIG, CHOLHDL, LDLDIRECT in the last 72 hours. ------------------------------------------------------------------------------------------------------------------ No results for input(s): TSH, T4TOTAL, T3FREE, THYROIDAB in the last 72 hours.  Invalid  input(s): FREET3 ------------------------------------------------------------------------------------------------------------------ No results for input(s): VITAMINB12, FOLATE, FERRITIN, TIBC, IRON, RETICCTPCT in the last 72 hours.  Coagulation profile No results for input(s): INR, PROTIME in the last 168 hours.  No results for input(s): DDIMER in the last 72 hours.  Cardiac Enzymes  Recent Labs Lab 08/05/17 1452 08/05/17 2021 08/06/17 0218  TROPONINI 0.03* 0.03* 0.03*   ------------------------------------------------------------------------------------------------------------------ Invalid input(s): POCBNP    Assessment & Plan   76 year old female with history of Parkinson's disease, CAD chronic kidney disease with history of hemodialysis with recent discontinuation of dialysis, persistent, chronic atrial fibrillation who presents with generalized weakness and hallucinations  1. Acute kidney injury on chronic kidney disease: No significant improvement in renal function Nephrology following No indication for dialysis   2. Auditory and visual hallucinations: This was started after patient started on prednisone Could be due to medications And UTI  continue to monitor symptoms  3. UTI: Continue Rocephin and follow up on urine culture  4. 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 Chronic atrial fibrillation complicated by rectus sheath hematoma while on warfarin: Heart rate currently controlled Continue aspirin She has [facility] by [facility] cardiology  6. Parkinson's disease: Continue Sinemet and doxepin  7. CAD: Continue aspirin and atorvastatin Continue metoprolol and isosorbide  8. History of DVT: Status post IVC filter  9. Generalized weakness: Physical therapy consultation requested  10. Elevated troponin: Due  to demand ischemia  11. Diabetes: Continue Levemir with sliding scale Hold oral medications due to decreased appetite      Code Status Orders        Start     Ordered   08/05/17 1416  Do not attempt resuscitation (DNR)  Continuous    Question Answer Comment  In  the event of cardiac or respiratory ARREST Do not call a "code blue"   In the event of cardiac or respiratory ARREST Do not perform Intubation, CPR, defibrillation or ACLS   In the event of cardiac or respiratory ARREST Use medication by any route, position, wound care, and other measures to relive pain and suffering. May use oxygen, suction and manual treatment of airway obstruction as needed for comfort.      08/05/17 1415    Code Status History    Date Active Date Inactive Code Status Order ID Comments User Context   07/02/2017  5:09 PM 07/03/2017 11:13 PM Full Code [id number]  Ramonita Lab, MD Inpatient   04/10/2017  3:27 AM 04/27/2017  9:50 PM Full Code [id number]  Arnaldo Natal, MD ED   04/03/2017 11:30 PM 04/09/2017 10:09 PM Full Code [id number]  Hugelmeyer, Alexis, DO ED   02/15/2017  6:16 PM 02/16/2017  6:18 PM Full Code [id number]  Altamese Dilling, MD Inpatient   01/26/2017  2:44 PM 01/29/2017  9:47 PM Full Code [id number]  Alford Highland, MD ED   12/28/2016  7:44 AM 12/29/2016  4:34 PM DNR [id number]  Delfino Lovett, MD Inpatient   12/27/2016 11:06 PM 12/28/2016  7:44 AM Full Code [id number]  Hugelmeyer, Jon Gills, DO Inpatient           Consults  nephrology  DVT Prophylaxis  SCDs  Lab Results  Component Value Date   PLT 118 (L) 08/06/2017     Time Spent in minutes   35 minutes Greater than 50% of time spent in care coordination and counseling patient regarding the condition and plan of care.   Auburn Bilberry M.D on 08/06/2017 at 2:46 PM  Between 7am to 6pm - Pager - [phone]  After 6pm go to [email or web address] - password [email or web address] [facility]  [facility]   Office  [phone]

## 2017-08-06 NOTE — Progress Notes (Addendum)
Initial Nutrition Assessment  DOCUMENTATION CODES:   Severe malnutrition in context of chronic illness  INTERVENTION:   Recommend check vitamin C, vitamin D, phosphorus, magnesium, iron, and ferritin labs  Liberalize diet   Nepro Shake po TID, each supplement provides 425 kcal and 19 grams protein  Renal MVI daily  MVI twice weekly   Recommend daily weights   NUTRITION DIAGNOSIS:   Severe Malnutrition related to chronic illness (CHF, CKD IV, DM, Parkinsons ) as evidenced by severe muscle depletions, 22 percent weight loss in 5 months and 13% weight loss in 1 month, moderate fat depletions.  GOAL:   Patient will meet greater than or equal to 90% of their needs  MONITOR:   PO intake, Supplement acceptance, Labs, Weight trends, I & O's, Skin  REASON FOR ASSESSMENT:   Malnutrition Screening Tool    ASSESSMENT:   76 year old female with history of Parkinson's disease, CAD chronic kidney disease with history of hemodialysis with recent discontinuation of dialysis, persistent, chronic atrial fibrillation who presents with generalized weakness and hallucinations   Met with pt in room today. Pt reports poor appetite and oral intake for the past 4 months. Per chart review, pt documented on multiple admits to have poor appetite and oral intake. Pt has lost 33lbs(22%) in 5 months, and 18lbs(13%) in one month; this is severe weight loss. Pt has had multiple admits for CHF and volume overload; some weight loss can be attributed to fluid loss but pt with moderate fat depletions and severe muscle wasting. Pt reports that she does drink Ensure and Nepro (prefers Nepro) but she does not drink this regularly. Pt does not take a multivitamin, only an Omega 3 and coenzyme Q10 daily. Pt was initiated on hemodialysis sometime in June and remained on HD 3 times weekly until 3 weeks ago when she was told that she no longer needed it. RD suspects pt with some nutrient deficiencies r/t hemodialysis and  poor oral intake for 3-4 months. Pt noted to have dry, brittle hair and bruising on bilateral arms. Pt is on anticoagulants. Recommend check vitamin D, vitamin C, P, Mg, and anemia labs. Last PTH level checked in September was 91. Pt reports continued poor appetite today but did eat 100% of a bowl of oatmeal this morning. Pt drank 10% of a Nepro today. RD discussed adequate protein intake with pt and encouraged her to drink at least 2 Nepro per day. RD will liberalize diet as pt not eating enough to exceed nutrient goals. Please encourage intake of supplements.   Medications reviewed and include: aspirin, lovenox, insulin, florastor, NaCl @50ml /hr, ceftriaxone      Labs reviewed: K 4.2 wnl, BUN 48(H), creat 2.09(H), Ca 8.1(L) Hgb 9.2(L), Hct 28.4(L) P 1.7(L)- 9/22 Vit D- 34.4- 6/28 PTH- 91(H)- 9/22  Nutrition-Focused physical exam completed. Findings are mild fat depletions in orbital and buccal regions, moderate fat depletions in chest, severe muscle depletions in clavicles, scapulas, hands, and BLE, and no edema. Pt noted to have dry, brittle hair and significant bruising in bilateral arms.    Diet Order:  Diet Carb Modified Fluid consistency: Thin; Room service appropriate? Yes  EDUCATION NEEDS:   Education needs have been addressed  Skin: Reviewed RN Assessment (noted significant bruising on bilateral arms)  Last BM:  10/25  Height:   Ht Readings from Last 1 Encounters:  08/05/17 5' 7"  (1.702 m)    Weight:   Wt Readings from Last 1 Encounters:  08/06/17 118 lb 1.6 oz (53.6 kg)  Ideal Body Weight:  61.4 kg  BMI:  Body mass index is 18.5 kg/m.  Estimated Nutritional Needs:   Kcal:  1500-1800kcal/day   Protein:  70-80g/day   Fluid:  per MD  Koleen Distance MS, RD, LDN Pager #[phone] After Hours Pager: [phone]

## 2017-08-06 NOTE — Evaluation (Signed)
Physical Therapy Evaluation Patient Details Name: Molly Wolf MRN: [id number] DOB: 1941-05-08 Today's Date: 08/06/2017   History of Present Illness  Pt is a 76 y.o. F with PMH including anxiety, arthritis, chronic systolic and diastolic CHF, stage IV CKD, closed R patellar fx (01/2017), COPD, CAD, depression, diabetes mellitus, DVT, HLD, HTN, Parkinson's disease and renal insufficiency. Per chart, pt started dialysis in July 2018, received treatment for 2-3 months, d/c from HD about 3 weeks ago by nephrologist. Pt presented to ED on 08/04/17 with nausea (pt reports nausea ever since she stopped HD), generalized weakness and auditory hallucinations (per chart, pt's daughter reports hallucinations started after taking steroids last week for bronchitis); pt admitted with acute cystitis without hematuria, delirium, actue renal failure superimposed on CKD, uremia and UTI.   Clinical Impression  3 weeks ago, pt reports she was receiving intermittent assistance with ADLs and ambulating with a rollator; within the past 3 weeks, pt reports her activity tolerance has become increasingly limited d/t weakness and fatigue, requiring assistance with all ADLs and ambulating with a wheelchair (self-propelled via arms and legs).  Pt lives at home with grandson and his girlfriend.  Currently pt is supervision for bed mobility, min guard for transfers and gait with RW (33ft into recliner); pt reports significant fatigue among sitting in recliner, limiting any further activity secondary to generalized strength and balance deficits.  Pt would benefit from skilled PT to address noted impairments and functional limitations (see below for any additional details).  Upon hospital discharge, recommend pt discharge to HHPT with 24hr assist.     Follow Up Recommendations Home health PT;Supervision/Assistance - 24 hour    Equipment Recommendations  Rolling walker with 5" wheels    Recommendations for Other Services        Precautions / Restrictions Precautions Precautions: Fall Restrictions Weight Bearing Restrictions: No      Mobility  Bed Mobility Overal bed mobility: Needs Assistance Bed Mobility: Supine to Sit     Supine to sit: HOB elevated;Supervision     General bed mobility comments: no difficulties noted, demonstrates good capabilities for mobility supine to seated EOB; railing use  Transfers Overall transfer level: Needs assistance Equipment used: Rolling walker (2 wheeled) Transfers: Sit to/from Stand Sit to Stand: Min guard         General transfer comment: min guard for safety, vc's for encouragement and standing fully upright; slightly increased time and effort to perform; unable to stand >30 seconds before pt reported that her legs were going to give out and that she needed to sit down, encouraged to step to side and sit in chair  Ambulation/Gait Ambulation/Gait assistance: Min guard Ambulation Distance (Feet): 3 Feet Assistive device: Rolling walker (2 wheeled)       General Gait Details: short steps, minimal foot clearance; decreased confidence in LE strength and gait abilities  Stairs            Wheelchair Mobility    Modified Rankin (Stroke Patients Only)       Balance Overall balance assessment: Needs assistance Sitting-balance support: Feet supported Sitting balance-Leahy Scale: Good Sitting balance - Comments: able to sit EOB, hands in lap, feet supported while performing LE ROM, MMT, and reaching outside of BOS with UE without LOB; posterior lean noted with knee extension in sitting, corrected with vc's   Standing balance support: Bilateral upper extremity supported Standing balance-Leahy Scale: Poor Standing balance comment: BUE support on RW in standing  Pertinent Vitals/Pain Pain Assessment: No/denies pain    Home Living Family/patient expects to be discharged to:: Private residence Living  Arrangements: Other relatives (grandson and his girlfriend) Available Help at Discharge: Family;Available 24 hours/day Type of Home: House Home Access: Stairs to enter Entrance Stairs-Rails: None Entrance Stairs-Number of Steps: 3 Home Layout: One level Home Equipment: Walker - 4 wheels;Bedside commode;Shower seat;Wheelchair - manual      Prior Function Level of Independence: Needs assistance         Comments: prior to a few weeks ago, pt required intermittent assist for ADLs, walking with rollator, per chart, pt required 2 assist for stairs; but over the past few weeks, pt reports requiring assist for all ADLs and ambulating with manual w/c (propels with arms and legs); pt reports hx of at least 4 falls within the past year d/t her "legs giving away"; baseline head and R arm tremor d/t PD; pt reports regularly taking PD medications     Hand Dominance        Extremity/Trunk Assessment   Upper Extremity Assessment Upper Extremity Assessment: Generalized weakness (4/5 strength bilaterally; RUE tremor)    Lower Extremity Assessment Lower Extremity Assessment:  (4/5 strength bilaterally)    Cervical / Trunk Assessment Cervical / Trunk Assessment:  (cervical/head tremor)  Communication   Communication: No difficulties  Cognition Arousal/Alertness: Awake/alert Behavior During Therapy: WFL for tasks assessed/performed Overall Cognitive Status: Within Functional Limits for tasks assessed                                 General Comments: oriented to self, DOB, year, month; pt correctly states it is getting close to Halloween, but was unsure of the current day of the week it is      General Comments      Exercises     Assessment/Plan    PT Assessment Patient needs continued PT services  PT Problem List Decreased strength;Decreased activity tolerance;Decreased balance;Decreased mobility       PT Treatment Interventions DME instruction;Gait training;Stair  training;Functional mobility training;Therapeutic activities;Therapeutic exercise;Balance training;Patient/family education    PT Goals (Current goals can be found in the Care Plan section)  Acute Rehab PT Goals Patient Stated Goal: to go home PT Goal Formulation: With patient Time For Goal Achievement: 08/20/17 Potential to Achieve Goals: Good    Frequency Min 2X/week   Barriers to discharge        Co-evaluation               AM-PAC PT "6 Clicks" Daily Activity  Outcome Measure Difficulty turning over in bed (including adjusting bedclothes, sheets and blankets)?: A Little Difficulty moving from lying on back to sitting on the side of the bed? : A Little Difficulty sitting down on and standing up from a chair with arms (e.g., wheelchair, bedside commode, etc,.)?: A Little Help needed moving to and from a bed to chair (including a wheelchair)?: A Little Help needed walking in hospital room?: A Little Help needed climbing 3-5 steps with a railing? : A Lot 6 Click Score: 17    End of Session Equipment Utilized During Treatment: Gait belt Activity Tolerance: Patient limited by fatigue Patient left: in chair;with call bell/phone within reach;with chair alarm set (heels elevated)   PT Visit Diagnosis: Unsteadiness on feet (R26.81);Other abnormalities of gait and mobility (R26.89);Muscle weakness (generalized) (M62.81);History of falling (Z91.81)    Time: 1030-1102 PT Time Calculation (min) (ACUTE ONLY):  32 min   Charges:         PT G CodesSarina Ser:          Roslynn Holte, SPT 08/06/2017, 11:45 AM

## 2017-08-06 NOTE — Progress Notes (Signed)
Pharmacy Antibiotic Note  Molly Wolf is a 76 y.o. female admitted on 08/05/2017 with UTI.  Pharmacy has been consulted for Ceftriaxone dosing.  Plan: Patient received Ceftriaxone 1 gram IV x 1 in ER. Will continue with Ceftriaxone 1 gram IV Q24h.    Height: 5\' 7"  (170.2 cm) Weight: 118 lb 1.6 oz (53.6 kg) IBW/kg (Calculated) : 61.6  Temp (24hrs), Avg:98.5 F (36.9 C), Min:98.2 F (36.8 C), Max:98.7 F (37.1 C)   Recent Labs Lab 08/05/17 1036 08/06/17 0218  WBC 4.5 3.7  CREATININE 2.09* 2.09*    Estimated Creatinine Clearance: 19.1 mL/min (A) (by C-G formula based on SCr of 2.09 mg/dL (H)).    Allergies  Allergen Reactions  . Prednisone Other (See Comments)    halluciantions    Antimicrobials this admission: CTX 10/25 >>       >>    Dose adjustments this admission:    Microbiology results:   BCx:   10/25 UCx: pending    Sputum:      MRSA PCR:    Thank you for allowing pharmacy to be a part of this patient's care.  Carola Frostathan A Shanika Levings 08/06/2017 3:07 PM

## 2017-08-06 NOTE — Progress Notes (Signed)
[facility], [place] 08/06/17  Subjective:   Patient is doing much better overall States that she is trying to eat more.  No shortness of breath Labs from this morning show creatinine is stable at 2.09 Urine culture is showing infection with E. coli  Objective:  Vital signs in last 24 hours:  Temp:  [98.2 F (36.8 C)-98.7 F (37.1 C)] 98.2 F (36.8 C) (10/26 1342) Pulse Rate:  [100-103] 103 (10/26 1342) Resp:  [16-18] 18 (10/26 1342) BP: (100-133)/(61-83) 100/61 (10/26 1342) SpO2:  [97 %-99 %] 98 % (10/26 1342) Weight:  [53.6 kg (118 lb 1.6 oz)] 53.6 kg (118 lb 1.6 oz) (10/26 0500)  Weight change:  Filed Weights   08/05/17 1028 08/05/17 1416 08/06/17 0500  Weight: 61.7 kg (136 lb) 53.6 kg (118 lb 1.6 oz) 53.6 kg (118 lb 1.6 oz)    Intake/Output:    Intake/Output Summary (Last 24 hours) at 08/06/17 1547 Last data filed at 08/06/17 1007  Gross per 24 hour  Intake              240 ml  Output                0 ml  Net              240 ml     Physical Exam: General:  Chronically ill-appearing, laying in the bed  HEENT  anicteric, moist oral mucous membranes  Neck  supple, no masses  Pulm/lungs  normal breathing effort, mild basilar rhonchi  CVS/Heart  tachycardic, no rub  Abdomen:   Soft, nondistended, mild epigastric tenderness  Extremities:  No peripheral edema  Neurologic:  Alert, able to answer questions and follow commands, Parkinson's tremors  Skin:  No acute rashes          Basic Metabolic Panel:   Recent Labs Lab 08/05/17 1036 08/06/17 0218 08/06/17 1350  NA 138 138  --   K 4.6 4.2  --   CL 103 109  --   CO2 21* 24  --   GLUCOSE 156* 95  --   BUN 44* 48*  --   CREATININE 2.09* 2.09*  --   CALCIUM 8.9 8.1*  --   MG  --   --  1.9  PHOS  --   --  2.8     CBC:  Recent Labs Lab 08/05/17 1036 08/06/17 0218  WBC 4.5 3.7  HGB 12.0 9.2*  HCT 37.1 28.4*  MCV 88.0 87.1  PLT 162 118*      Lab Results   Component Value Date   HEPBSAG Negative 07/03/2017   HEPBSAB Non Reactive 04/22/2017      Microbiology:  Recent Results (from the past 240 hour(s))  Urine Culture     Status: Abnormal (Preliminary result)   Collection Time: 08/05/17 10:36 AM  Result Value Ref Range Status   Specimen Description URINE, RANDOM  Final   Special Requests NONE  Final   Culture (A)  Final    >=100,000 COLONIES/mL ESCHERICHIA COLI SUSCEPTIBILITIES TO FOLLOW Performed at [facility], [address], [place], [place] [address]    Report Status PENDING  Incomplete    Coagulation Studies: No results for input(s): LABPROT, INR in the last 72 hours.  Urinalysis:  Recent Labs  08/05/17 1035  COLORURINE AMBER*  LABSPEC 1.019  PHURINE 5.0  GLUCOSEU NEGATIVE  HGBUR SMALL*  BILIRUBINUR NEGATIVE  KETONESUR 5*  PROTEINUR >=300*  NITRITE NEGATIVE  LEUKOCYTESUR  MODERATE*      Imaging: Dg Chest 2 View  Result Date: 08/05/2017 CLINICAL DATA:  Cough, nausea, and malaise for 3-4 days. EXAM: CHEST  2 VIEW COMPARISON:  07/14/2017 FINDINGS: Right jugular dialysis catheter terminates over the mid to lower SVC, unchanged. The cardiac silhouette remains enlarged. The lungs are well inflated without evidence of confluent airspace opacity, edema, or pneumothorax. Small bilateral pleural effusions are similar to the prior study. An IVC filter is partially visualized in the abdomen. There is mild thoracic spondylosis and dextroscoliosis. IMPRESSION: Unchanged small pleural effusions. No evidence of acute airspace disease. Electronically Signed   By: Sebastian AcheAllen  Grady M.D.   On: 08/05/2017 12:09     Medications:   . sodium chloride 50 mL/hr at 08/06/17 1210  . cefTRIAXone (ROCEPHIN)  IV Stopped (08/06/17 0914)   . aspirin EC  81 mg Oral QODAY  . atorvastatin  20 mg Oral QHS  . Carbidopa-Levodopa ER  2 tablet Oral QID  . doxepin  10 mg Oral QHS  . enoxaparin (LOVENOX) injection  30 mg Subcutaneous Q24H  .  escitalopram  20 mg Oral Daily  . feeding supplement (NEPRO CARB STEADY)  237 mL Oral TID BM  . gabapentin  300 mg Oral BID  . hydrALAZINE  10 mg Oral BID  . insulin aspart  0-9 Units Subcutaneous TID WC  . insulin detemir  7 Units Subcutaneous QHS  . isosorbide mononitrate  60 mg Oral Daily  . metoprolol tartrate  100 mg Oral BID  . mirabegron ER  25 mg Oral Daily  . multivitamin  1 tablet Oral QHS  . [START ON 08/09/2017] multivitamin with minerals  1 tablet Oral Once per day on Mon Thu  . saccharomyces boulardii  250 mg Oral Daily     Assessment/ Plan:  76 y.o. female  with coronary artery disease, congestive heart failure, pulmonary hypertension, parkinson's, diabetes mellitus type II, COPD, DVT, atrial fibrillation, hyperlipidemia, anemia, anxiety, patellar fracture,  1.  Acute renal failure 2.  Chronic kidney disease stage IV.  Baseline creatinine 1.71/GFR 28 on July 14, 2017 3.  Diabetes type 2 with chronic kidney disease 4.  Urinary tract infection, from E. coli   Patient did have IV contrast exposure on July 14, 2017 for a CT angiogram Presenting creatinine is 2.09 Agree with treating urinary tract infection Follow labs No acute indication of HD at present    LOS: 1 [facility],Calieb Lichtman 10/26/20183:47 PM  [facility] [place], [place] [phone]

## 2017-08-07 ENCOUNTER — Inpatient Hospital Stay: Payer: Medicare Other

## 2017-08-07 DIAGNOSIS — R410 Disorientation, unspecified: Secondary | ICD-10-CM | POA: Diagnosis not present

## 2017-08-07 DIAGNOSIS — N179 Acute kidney failure, unspecified: Secondary | ICD-10-CM | POA: Diagnosis not present

## 2017-08-07 LAB — CBC
HCT: 28.5 % — ABNORMAL LOW (ref 35.0–47.0)
HEMATOCRIT: 27.7 % — AB (ref 35.0–47.0)
HEMOGLOBIN: 9.1 g/dL — AB (ref 12.0–16.0)
HEMOGLOBIN: 9.3 g/dL — AB (ref 12.0–16.0)
MCH: 28.9 pg (ref 26.0–34.0)
MCH: 28.7 pg (ref 26.0–34.0)
MCHC: 32.8 g/dL (ref 32.0–36.0)
MCHC: 32.6 g/dL (ref 32.0–36.0)
MCV: 88 fL (ref 80.0–100.0)
MCV: 88.1 fL (ref 80.0–100.0)
PLATELETS: 106 10*3/uL — AB (ref 150–440)
Platelets: 110 10*3/uL — ABNORMAL LOW (ref 150–440)
RBC: 3.14 MIL/uL — AB (ref 3.80–5.20)
RBC: 3.23 MIL/uL — AB (ref 3.80–5.20)
RDW: 16 % — ABNORMAL HIGH (ref 11.5–14.5)
RDW: 16.4 % — ABNORMAL HIGH (ref 11.5–14.5)
WBC: 3.2 10*3/uL — AB (ref 3.6–11.0)
WBC: 3.5 10*3/uL — AB (ref 3.6–11.0)

## 2017-08-07 LAB — BASIC METABOLIC PANEL
Anion gap: 6 (ref 5–15)
BUN: 44 mg/dL — AB (ref 6–20)
CALCIUM: 8 mg/dL — AB (ref 8.9–10.3)
CHLORIDE: 109 mmol/L (ref 101–111)
CO2: 24 mmol/L (ref 22–32)
CREATININE: 2.04 mg/dL — AB (ref 0.44–1.00)
GFR calc Af Amer: 26 mL/min — ABNORMAL LOW (ref >60)
GFR, EST NON AFRICAN AMERICAN: 22 mL/min — AB (ref >60)
Glucose, Bld: 128 mg/dL — ABNORMAL HIGH (ref 65–99)
Potassium: 4 mmol/L (ref 3.5–5.1)
SODIUM: 139 mmol/L (ref 135–145)

## 2017-08-07 LAB — RENAL FUNCTION PANEL
ANION GAP: 3 — AB (ref 5–15)
Albumin: 3 g/dL — ABNORMAL LOW (ref 3.5–5.0)
BUN: 47 mg/dL — ABNORMAL HIGH (ref 6–20)
CHLORIDE: 110 mmol/L (ref 101–111)
CO2: 26 mmol/L (ref 22–32)
CREATININE: 1.88 mg/dL — AB (ref 0.44–1.00)
Calcium: 8.2 mg/dL — ABNORMAL LOW (ref 8.9–10.3)
GFR, EST AFRICAN AMERICAN: 29 mL/min — AB (ref >60)
GFR, EST NON AFRICAN AMERICAN: 25 mL/min — AB (ref >60)
Glucose, Bld: 57 mg/dL — ABNORMAL LOW (ref 65–99)
POTASSIUM: 3.9 mmol/L (ref 3.5–5.1)
Phosphorus: 3 mg/dL (ref 2.5–4.6)
Sodium: 139 mmol/L (ref 135–145)

## 2017-08-07 LAB — URINE CULTURE

## 2017-08-07 LAB — GLUCOSE, CAPILLARY
GLUCOSE-CAPILLARY: 186 mg/dL — AB (ref 65–99)
GLUCOSE-CAPILLARY: 176 mg/dL — AB (ref 65–99)
GLUCOSE-CAPILLARY: 138 mg/dL — AB (ref 65–99)
GLUCOSE-CAPILLARY: 144 mg/dL — AB (ref 65–99)
Glucose-Capillary: 39 mg/dL — CL (ref 65–99)
Glucose-Capillary: 180 mg/dL — ABNORMAL HIGH (ref 65–99)

## 2017-08-07 LAB — HEPATIC FUNCTION PANEL
ALBUMIN: 3 g/dL — AB (ref 3.5–5.0)
ALT: <5 U/L — ABNORMAL LOW (ref 14–54)
AST: 10 U/L — ABNORMAL LOW (ref 15–41)
Alkaline Phosphatase: 32 U/L — ABNORMAL LOW (ref 38–126)
BILIRUBIN TOTAL: 1.2 mg/dL (ref 0.3–1.2)
Bilirubin, Direct: 0.2 mg/dL (ref 0.1–0.5)
Indirect Bilirubin: 1 mg/dL — ABNORMAL HIGH (ref 0.3–0.9)
Total Protein: 4.8 g/dL — ABNORMAL LOW (ref 6.5–8.1)

## 2017-08-07 LAB — VITAMIN D 25 HYDROXY (VIT D DEFICIENCY, FRACTURES): Vit D, 25-Hydroxy: 31.3 ng/mL (ref 30.0–100.0)

## 2017-08-07 MED ORDER — SODIUM CHLORIDE 0.9 % IV BOLUS (SEPSIS)
500.0000 mL | Freq: Once | INTRAVENOUS | Status: AC
  Administered 2017-08-07: 14:00:00 500 mL via INTRAVENOUS

## 2017-08-07 MED ORDER — DEXTROSE 50 % IV SOLN
1.0000 | INTRAVENOUS | Status: AC
  Administered 2017-08-07: 50 mL via INTRAVENOUS

## 2017-08-07 MED ORDER — DEXTROSE 50 % IV SOLN
INTRAVENOUS | Status: AC
  Filled 2017-08-07: qty 50

## 2017-08-07 NOTE — Progress Notes (Signed)
[employer] - [place] [facility]                                                                                                                                                                                  Patient Demographics   Molly Wolf, is a 76 y.o. female, DOB - 08-Jul-1941, ZOX:[id number]  Admit date - 08/05/2017   Admitting Physician Adrian Saran, MD  Outpatient Primary MD for the patient is Lauro Regulus, MD   LOS - 2  Subjective: Pt admitted with weakness and hallucinations Was better yesterday as per nurse and her daughter, today more drowsy. Her Bl sugar was low this morning and have Low BP by afternoon.   Review of Systems:   CONSTITUTIONAL: No documented fever. No fatigue, weakness. No weight gain, no weight loss.  EYES: No blurry or double vision.  ENT: No tinnitus. No postnasal drip. No redness of the oropharynx.  RESPIRATORY: No cough, no wheeze, no hemoptysis. No dyspnea.  CARDIOVASCULAR: No chest pain. No orthopnea. No palpitations. No syncope.  GASTROINTESTINAL: No nausea, no vomiting or diarrhea. No abdominal pain. No melena or hematochezia.  GENITOURINARY: No dysuria or hematuria.  ENDOCRINE: No polyuria or nocturia. No heat or cold intolerance.  HEMATOLOGY: No anemia. No bruising. No bleeding.  INTEGUMENTARY: No rashes. No lesions.  MUSCULOSKELETAL: No arthritis. No swelling. No gout.  NEUROLOGIC: No numbness, tingling, or ataxia. No seizure-type activity.  PSYCHIATRIC: positive hallucination   Vitals:   Vitals:   08/07/17 0500 08/07/17 0804 08/07/17 1344 08/07/17 1411  BP:  118/64 (!) 76/51 (!) 78/54  Pulse:  100 65 72  Resp:      Temp:      TempSrc:      SpO2:  97% 98% 99%  Weight: 54.5 kg (120 lb 3.2 oz)     Height:        Wt Readings from Last 3 Encounters:  08/07/17 54.5 kg (120 lb 3.2 oz)  07/03/17 61.8 kg (136 lb 3.9 oz)  04/08/17 68.9 kg (152 lb)     Intake/Output Summary (Last 24 hours) at 08/07/17  1515 Last data filed at 08/07/17 1356  Gross per 24 hour  Intake              760 ml  Output                0 ml  Net              760 ml    Physical Exam:   GENERAL: Pleasant-appearing in no apparent distress.  HEAD, EYES, EARS, NOSE AND THROAT: Atraumatic, normocephalic. Extraocular muscles are intact. Pupils equal and reactive to  light. Sclerae anicteric. No conjunctival injection. No oro-pharyngeal erythema.  NECK: Supple. There is no jugular venous distention. No bruits, no lymphadenopathy, no thyromegaly.  HEART: Regular rate and rhythm,. No murmurs, no rubs, no clicks.  LUNGS: Clear to auscultation bilaterally. No rales or rhonchi. No wheezes.  ABDOMEN: Soft, flat, nontender, nondistended. Has good bowel sounds. No hepatosplenomegaly appreciated.  EXTREMITIES: No evidence of any cyanosis, clubbing, or peripheral edema.  +2 pedal and radial pulses bilaterally.  NEUROLOGIC: The patient is drowsy, but arousable, with no focal motor or sensory deficits appreciated bilaterally.  SKIN: Moist and warm with no rashes appreciated.  Psych: Not anxious, depressed LN: No inguinal LN enlargement    Antibiotics   Anti-infectives    Start     Dose/Rate Route Frequency Ordered Stop   08/06/17 1000  cefTRIAXone (ROCEPHIN) 1 g in dextrose 5 % 50 mL IVPB     1 g 100 mL/hr over 30 Minutes Intravenous Every 24 hours 08/05/17 1442     08/05/17 1215  cefTRIAXone (ROCEPHIN) 1 g in dextrose 5 % 50 mL IVPB - Premix     1 g 100 mL/hr over 30 Minutes Intravenous  Once 08/05/17 1200 08/05/17 1256      Medications   Scheduled Meds: . aspirin EC  81 mg Oral QODAY  . atorvastatin  20 mg Oral QHS  . Carbidopa-Levodopa ER  2 tablet Oral QID  . dextrose      . doxepin  10 mg Oral QHS  . enoxaparin (LOVENOX) injection  30 mg Subcutaneous Q24H  . escitalopram  20 mg Oral Daily  . feeding supplement (NEPRO CARB STEADY)  237 mL Oral TID BM  . gabapentin  300 mg Oral BID  . insulin aspart  0-9 Units  Subcutaneous TID WC  . mirabegron ER  25 mg Oral Daily  . multivitamin  1 tablet Oral QHS  . [START ON 08/09/2017] multivitamin with minerals  1 tablet Oral Once per day on Mon Thu  . saccharomyces boulardii  250 mg Oral Daily   Continuous Infusions: . sodium chloride 50 mL/hr at 08/07/17 1448  . cefTRIAXone (ROCEPHIN)  IV Stopped (08/07/17 0834)   PRN Meds:.acetaminophen **OR** acetaminophen, acetaminophen, bisacodyl, hydrALAZINE, HYDROcodone-acetaminophen, loratadine, LORazepam, nitroGLYCERIN, ondansetron **OR** ondansetron (ZOFRAN) IV, ondansetron, polyethylene glycol, polyethylene glycol, promethazine, traMADol   Data Review:   Micro Results Recent Results (from the past 240 hour(s))  Urine Culture     Status: Abnormal   Collection Time: 08/05/17 10:36 AM  Result Value Ref Range Status   Specimen Description URINE, RANDOM  Final   Special Requests NONE  Final   Culture >=100,000 COLONIES/mL ESCHERICHIA COLI (A)  Final   Report Status 08/07/2017 FINAL  Final   Organism ID, Bacteria ESCHERICHIA COLI (A)  Final      Susceptibility   Escherichia coli - MIC*    AMPICILLIN >=32 RESISTANT Resistant     CEFAZOLIN <=4 SENSITIVE Sensitive     CEFTRIAXONE <=1 SENSITIVE Sensitive     CIPROFLOXACIN >=4 RESISTANT Resistant     GENTAMICIN <=1 SENSITIVE Sensitive     IMIPENEM <=0.25 SENSITIVE Sensitive     NITROFURANTOIN <=16 SENSITIVE Sensitive     TRIMETH/SULFA >=320 RESISTANT Resistant     AMPICILLIN/SULBACTAM 16 INTERMEDIATE Intermediate     PIP/TAZO <=4 SENSITIVE Sensitive     Extended ESBL NEGATIVE Sensitive     * >=100,000 COLONIES/mL ESCHERICHIA COLI    Radiology Reports Dg Chest 1 View  Result Date: 08/07/2017 CLINICAL DATA:  Encounter for AMS. Patient denies SOB or CP at this time. Hx Parkinson's, HTN, DM, COPD, stent placement. Non-smoker. EXAM: CHEST 1 VIEW COMPARISON:  08/05/2017 FINDINGS: Patient has right-sided dual lumen catheter, tip overlying the level of the  superior vena cava. The heart is enlarged and stable in configuration. There are no focal consolidations or pleural effusions. No pulmonary edema. IMPRESSION: Stable cardiomegaly.  No evidence for acute pulmonary abnormality. Electronically Signed   By: Norva Pavlov M.D.   On: 08/07/2017 14:33   Dg Chest 2 View  Result Date: 08/05/2017 CLINICAL DATA:  Cough, nausea, and malaise for 3-4 days. EXAM: CHEST  2 VIEW COMPARISON:  07/14/2017 FINDINGS: Right jugular dialysis catheter terminates over the mid to lower SVC, unchanged. The cardiac silhouette remains enlarged. The lungs are well inflated without evidence of confluent airspace opacity, edema, or pneumothorax. Small bilateral pleural effusions are similar to the prior study. An IVC filter is partially visualized in the abdomen. There is mild thoracic spondylosis and dextroscoliosis. IMPRESSION: Unchanged small pleural effusions. No evidence of acute airspace disease. Electronically Signed   By: Sebastian Ache M.D.   On: 08/05/2017 12:09   Dg Chest 2 View  Result Date: 07/14/2017 CLINICAL DATA:  Cough. Increased weakness today. History of COPD and dialysis. EXAM: CHEST  2 VIEW COMPARISON:  07/02/2017 FINDINGS: Right jugular dialysis catheter terminates over the mid to lower SVC, unchanged. The cardiac silhouette remains enlarged. Small bilateral pleural effusions are stable to slightly improved. The interstitial markings are less prominent than on the prior study. There is improved aeration of the left lung base. No new airspace consolidation, overt edema, or pneumothorax is identified. An IVC filter is partially visualized in the abdomen. Thoracic spondylosis is noted. IMPRESSION: 1. Small pleural effusions, stable to mildly improved. 2. Improved aeration of the left lung base. No definite evidence of pneumonia or edema. Electronically Signed   By: Sebastian Ache M.D.   On: 07/14/2017 13:43   Ct Angio Abd/pel W And/or Wo Contrast  Result Date:  07/14/2017 CLINICAL DATA:  Postprandial pain this week. Nausea and vomiting. Weight loss. History of rectus sheath hematoma. Elevated INR. EXAM: CTA ABDOMEN AND PELVIS wITHOUT AND WITH CONTRAST TECHNIQUE: Multidetector CT imaging of the abdomen and pelvis was performed using the standard protocol during bolus administration of intravenous contrast. Multiplanar reconstructed images and MIPs were obtained and reviewed to evaluate the vascular anatomy. CONTRAST:  75 cc Isovue 370 COMPARISON:  04/23/2017 FINDINGS: VASCULAR Aorta: Nonaneurysmal and patent with mild scattered atherosclerotic calcifications. Celiac: Patent. Branch vessels are ectatic. No focal saccular aneurysm. Branch vessels are patent. SMA: Patent and ectatic. Renals: Single renal arteries are patent bilaterally. IMA: Atherosclerotic calcification is present at the origin. Significant narrowing may be present at the origin. Beyond the origin, it is patent. Inflow: Bilateral common iliac and external iliac arteries are patent and mildly ectatic. Internal iliac arteries are patent and mildly ectatic. Proximal Outflow: Grossly patent. Review of the MIP images confirms the above findings. NON-VASCULAR Lower chest: Small pericardial effusion is stable. Three vessel coronary artery calcifications. Moderate aortic valvular calcification. There are calcifications within the chordae tendineae of the left ventricle. Dependent atelectasis. There is a more confluent patchy opacity at the right lung base measuring up to 2.7 cm. Small bilateral pleural effusions have improved. Hepatobiliary: There is low density surrounding the portal triads. Gallbladder sludge is suspected. Pancreas: Atrophic.  No focal mass. Spleen: Unremarkable Adrenals/Urinary Tract: 1.6 cm indeterminate hypodensity in the upper pole of the left  kidney demonstrates some internal septal enhancement. 2.2 cm simple cyst in the lower pole of the left kidney. Right kidney is within normal limits.  Adrenal glands are within normal limits. Bladder is within normal limits. Stomach/Bowel: No evidence of small-bowel obstruction. No obvious mass in the colon. Stomach is unremarkable. Lymphatic: No abnormal retroperitoneal adenopathy. Reproductive: Uterus and adnexa are within normal limits. Other: Small amount of ascites has improved since the prior study. The rectus sheath hematoma on the right towards the pelvis has improved in size. Today it measures 3.8 x 2.1 cm and previously measured 6.6 x 4.5 cm based on my personal measurements. No new hematoma. IVC filter is in place just below the renal vein inflow. Musculoskeletal: No vertebral compression deformity. Advanced degenerative disc disease in lumbar spine is noted. IMPRESSION: VASCULAR Celiac and SMA are patent. Significant narrowing at the origin of the IMA may be present. These findings result in a low probability for mesenteric ischemia. NON-VASCULAR Small pericardial effusion is stable. Small bilateral pleural effusions have improved 2.7 cm indeterminate patchy opacity at the right lung base. Initial follow-up by chest CT without contrast is recommended in 3 months to confirm persistence. This recommendation follows the consensus statement: Recommendations for the Management of Subsolid Pulmonary Nodules Detected at CT: A Statement from the Fleischner Society as published in Radiology 2013; 266:304-317. Low density surrounding the portal triads in the liver suggests volume overload. An inflammatory process of the liver cannot be excluded. Correlate with liver function tests. Indeterminate low-density lesion in the left kidney with some central enhancement. Cystic neoplasm is not excluded. MRI of the kidneys is recommended. Improved right lower rectus sheath hematoma. Electronically Signed   By: Jolaine Click M.D.   On: 07/14/2017 14:23     CBC  Recent Labs Lab 08/05/17 1036 08/06/17 0218 08/07/17 0455 08/07/17 1410  WBC 4.5 3.7 3.2* 3.5*  HGB  12.0 9.2* 9.1* 9.3*  HCT 37.1 28.4* 27.7* 28.5*  PLT 162 118* 106* 110*  MCV 88.0 87.1 88.0 88.1  MCH 28.3 28.4 28.9 28.7  MCHC 32.2 32.6 32.8 32.6  RDW 16.5* 16.2* 16.0* 16.4*    Chemistries   Recent Labs Lab 08/05/17 1036 08/06/17 0218 08/06/17 1350 08/07/17 0455 08/07/17 1410  NA 138 138  --  139 139  K 4.6 4.2  --  3.9 4.0  CL 103 109  --  110 109  CO2 21* 24  --  26 24  GLUCOSE 156* 95  --  57* 128*  BUN 44* 48*  --  47* 44*  CREATININE 2.09* 2.09*  --  1.88* 2.04*  CALCIUM 8.9 8.1*  --  8.2* 8.0*  MG  --   --  1.9  --   --   AST 11*  --   --  10*  --   ALT <5*  --   --  <5*  --   ALKPHOS 45  --   --  32*  --   BILITOT 2.2*  --   --  1.2  --    ------------------------------------------------------------------------------------------------------------------ estimated creatinine clearance is 19.9 mL/min (A) (by C-G formula based on SCr of 2.04 mg/dL (H)). ------------------------------------------------------------------------------------------------------------------  Recent Labs  08/05/17 1452  HGBA1C 6.8*   ------------------------------------------------------------------------------------------------------------------ No results for input(s): CHOL, HDL, LDLCALC, TRIG, CHOLHDL, LDLDIRECT in the last 72 hours. ------------------------------------------------------------------------------------------------------------------ No results for input(s): TSH, T4TOTAL, T3FREE, THYROIDAB in the last 72 hours.  Invalid input(s): FREET3 ------------------------------------------------------------------------------------------------------------------  Recent Labs  08/06/17 1350  FERRITIN 294  TIBC  242*  IRON 211*    Coagulation profile No results for input(s): INR, PROTIME in the last 168 hours.  No results for input(s): DDIMER in the last 72 hours.  Cardiac Enzymes  Recent Labs Lab 08/05/17 1452 08/05/17 2021 08/06/17 0218  TROPONINI 0.03* 0.03* 0.03*    ------------------------------------------------------------------------------------------------------------------ Invalid input(s): POCBNP    Assessment & Plan   76 year old female with history of Parkinson's disease, CAD chronic kidney disease with history of hemodialysis with recent discontinuation of dialysis, persistent, chronic atrial fibrillation who presents with generalized weakness and hallucinations  1. Acute kidney injury on chronic kidney disease: significant improvement in renal function Nephrology following No indication for dialysis  2. Auditory and visual hallucinations: This was started after patient started on prednisone Could be due to medications And UTI  continue to monitor symptoms, more drowsy again today.  3. UTI: Continue Rocephin and follow up on urine culture.  4. 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  5. Chronic atrial fibrillation complicated by rectus sheath hematoma while on warfarin: Heart rate currently controlled Continue aspirin She has followed by [facility] cardiology  6. Parkinson's disease: Continue Sinemet and doxepin  7. CAD: Continue aspirin and atorvastatin Held metoprolol and isosorbide  8. History of DVT: Status post IVC filter  9. Generalized weakness: Physical therapy consultation requested  10. Elevated troponin: Due to demand ischemia  11. Diabetes: Now Hypoglycemia on 08/07/17 morning. Hold oral medications due to decreased appetite Hold levemir, D50 given, stable now, monitor.\  12. Hypotension   Episode on 08/07/17   IV fluid bolus.   No fever. Pt is drowsy.   Check CBC, CMP and Xray chest stat.   Stop all BP meds.      Code Status Orders        Start     Ordered   08/05/17 1416  Do not attempt resuscitation (DNR)   Continuous    Question Answer Comment  In the event of cardiac or respiratory ARREST Do not call a "code blue"   In the event of cardiac or respiratory ARREST Do not perform Intubation, CPR, defibrillation or ACLS   In the event of cardiac or respiratory ARREST Use medication by any route, position, wound care, and other measures to relive pain and suffering. May use oxygen, suction and manual treatment of airway obstruction as needed for comfort.      08/05/17 1415    Code Status History    Date Active Date Inactive Code Status Order ID Comments User Context   07/02/2017  5:09 PM 07/03/2017 11:13 PM Full Code [id number]  Ramonita Lab, MD Inpatient   04/10/2017  3:27 AM 04/27/2017  9:50 PM Full Code [id number]  Arnaldo Natal, MD ED   04/03/2017 11:30 PM 04/09/2017 10:09 PM Full Code [id number]  Hugelmeyer, Alexis, DO ED   02/15/2017  6:16 PM 02/16/2017  6:18 PM Full Code [id number]  Altamese Dilling, MD Inpatient   01/26/2017  2:44 PM 01/29/2017  9:47 PM Full Code [id number]  Alford Highland, MD ED   12/28/2016  7:44 AM 12/29/2016  4:34 PM DNR [id number]  Delfino Lovett, MD Inpatient   12/27/2016 11:06 PM 12/28/2016  7:44 AM Full Code [id number]  Hugelmeyer, Jon Gills, DO Inpatient       Consults  nephrology  DVT Prophylaxis  SCDs  Lab Results  Component  Value Date   PLT 110 (L) 08/07/2017     Time Spent in minutes   45 critical care minutes Greater than 50% of time spent in care coordination and counseling patient regarding the condition and plan of care. Spoke to her daughter on phone.  Altamese DillingVACHHANI, Cheyann Blecha M.D on 08/07/2017 at 3:15 PM  Between 7am to 6pm - Pager - [phone]  After 6pm go to [email or web address] - password [email or web address] [facility]  [facility]   Office  [phone]

## 2017-08-07 NOTE — Progress Notes (Signed)
Dr Esaw GrandchildVachanni made aware of pts VS, new orders placed

## 2017-08-07 NOTE — Progress Notes (Signed)
Dr Thersa SaltVachhanni made aware that pts mental status and alertness orientation decreased from yesterday, pt having more trouble standing and transferring, MD to assess

## 2017-08-07 NOTE — Progress Notes (Signed)
Pt has not voided thus far this shift, states she feels like she needs to void but cant, bladder scan shows in bladder, MD made aware new order to insert foley cath for acute urinary retention and to monitor I&O

## 2017-08-07 NOTE — Progress Notes (Signed)
Dr Esaw GrandchildVachanni made aware of pt with low BS this morning, also made aware that pt more somnolent this morning but arouses easily to touch/voice, MD to assess pt

## 2017-08-07 NOTE — Progress Notes (Signed)
[facility], [place] 08/07/17  Subjective:   Patient is doing much better overall States that she is trying to eat more.  No shortness of breath Labs from this morning show creatinine is slightly improved at 1.88 Urine culture is showing infection with E. coli  Objective:  Vital signs in last 24 hours:  Temp:  [98.2 F (36.8 C)-98.3 F (36.8 C)] 98.3 F (36.8 C) (10/27 0438) Pulse Rate:  [100-113] 100 (10/27 0804) Resp:  [18] 18 (10/27 0438) BP: (100-141)/(61-88) 118/64 (10/27 0804) SpO2:  [97 %-100 %] 97 % (10/27 0804) Weight:  [54.5 kg (120 lb 3.2 oz)] 54.5 kg (120 lb 3.2 oz) (10/27 0500)  Weight change: -7.167 kg (-15 lb 12.8 oz) Filed Weights   08/05/17 1416 08/06/17 0500 08/07/17 0500  Weight: 53.6 kg (118 lb 1.6 oz) 53.6 kg (118 lb 1.6 oz) 54.5 kg (120 lb 3.2 oz)    Intake/Output:    Intake/Output Summary (Last 24 hours) at 08/07/17 1016 Last data filed at 08/07/17 0300  Gross per 24 hour  Intake              400 ml  Output                0 ml  Net              400 ml     Physical Exam: General:  Chronically ill-appearing, laying in the bed  HEENT  anicteric, moist oral mucous membranes  Neck  supple, no masses  Pulm/lungs  normal breathing effort, mild basilar rhonchi  CVS/Heart  tachycardic, no rub  Abdomen:   Soft, nondistended, mild epigastric tenderness  Extremities:  No peripheral edema  Neurologic:  Alert, able to answer questions and follow commands, Parkinson's tremors  Skin:  No acute rashes          Basic Metabolic Panel:   Recent Labs Lab 08/05/17 1036 08/06/17 0218 08/06/17 1350 08/07/17 0455  NA 138 138  --  139  K 4.6 4.2  --  3.9  CL 103 109  --  110  CO2 21* 24  --  26  GLUCOSE 156* 95  --  57*  BUN 44* 48*  --  47*  CREATININE 2.09* 2.09*  --  1.88*  CALCIUM 8.9 8.1*  --  8.2*  MG  --   --  1.9  --   PHOS  --   --  2.8 3.0     CBC:  Recent Labs Lab 08/05/17 1036 08/06/17 0218  08/07/17 0455  WBC 4.5 3.7 3.2*  HGB 12.0 9.2* 9.1*  HCT 37.1 28.4* 27.7*  MCV 88.0 87.1 88.0  PLT 162 118* 106*      Lab Results  Component Value Date   HEPBSAG Negative 07/03/2017   HEPBSAB Non Reactive 04/22/2017      Microbiology:  Recent Results (from the past 240 hour(s))  Urine Culture     Status: Abnormal   Collection Time: 08/05/17 10:36 AM  Result Value Ref Range Status   Specimen Description URINE, RANDOM  Final   Special Requests NONE  Final   Culture >=100,000 COLONIES/mL ESCHERICHIA COLI (A)  Final   Report Status 08/07/2017 FINAL  Final   Organism ID, Bacteria ESCHERICHIA COLI (A)  Final      Susceptibility   Escherichia coli - MIC*    AMPICILLIN >=32 RESISTANT Resistant     CEFAZOLIN <=4 SENSITIVE Sensitive     CEFTRIAXONE <=1 SENSITIVE Sensitive  CIPROFLOXACIN >=4 RESISTANT Resistant     GENTAMICIN <=1 SENSITIVE Sensitive     IMIPENEM <=0.25 SENSITIVE Sensitive     NITROFURANTOIN <=16 SENSITIVE Sensitive     TRIMETH/SULFA >=320 RESISTANT Resistant     AMPICILLIN/SULBACTAM 16 INTERMEDIATE Intermediate     PIP/TAZO <=4 SENSITIVE Sensitive     Extended ESBL NEGATIVE Sensitive     * >=100,000 COLONIES/mL ESCHERICHIA COLI    Coagulation Studies: No results for input(s): LABPROT, INR in the last 72 hours.  Urinalysis:  Recent Labs  08/05/17 1035  COLORURINE AMBER*  LABSPEC 1.019  PHURINE 5.0  GLUCOSEU NEGATIVE  HGBUR SMALL*  BILIRUBINUR NEGATIVE  KETONESUR 5*  PROTEINUR >=300*  NITRITE NEGATIVE  LEUKOCYTESUR MODERATE*      Imaging: Dg Chest 2 View  Result Date: 08/05/2017 CLINICAL DATA:  Cough, nausea, and malaise for 3-4 days. EXAM: CHEST  2 VIEW COMPARISON:  07/14/2017 FINDINGS: Right jugular dialysis catheter terminates over the mid to lower SVC, unchanged. The cardiac silhouette remains enlarged. The lungs are well inflated without evidence of confluent airspace opacity, edema, or pneumothorax. Small bilateral pleural effusions  are similar to the prior study. An IVC filter is partially visualized in the abdomen. There is mild thoracic spondylosis and dextroscoliosis. IMPRESSION: Unchanged small pleural effusions. No evidence of acute airspace disease. Electronically Signed   By: Sebastian AcheAllen  Grady M.D.   On: 08/05/2017 12:09     Medications:   . sodium chloride 50 mL/hr at 08/07/17 0805  . cefTRIAXone (ROCEPHIN)  IV Stopped (08/07/17 0834)   . aspirin EC  81 mg Oral QODAY  . atorvastatin  20 mg Oral QHS  . Carbidopa-Levodopa ER  2 tablet Oral QID  . dextrose      . doxepin  10 mg Oral QHS  . enoxaparin (LOVENOX) injection  30 mg Subcutaneous Q24H  . escitalopram  20 mg Oral Daily  . feeding supplement (NEPRO CARB STEADY)  237 mL Oral TID BM  . gabapentin  300 mg Oral BID  . hydrALAZINE  10 mg Oral BID  . insulin aspart  0-9 Units Subcutaneous TID WC  . insulin detemir  7 Units Subcutaneous QHS  . isosorbide mononitrate  60 mg Oral Daily  . metoprolol tartrate  100 mg Oral BID  . mirabegron ER  25 mg Oral Daily  . multivitamin  1 tablet Oral QHS  . [START ON 08/09/2017] multivitamin with minerals  1 tablet Oral Once per day on Mon Thu  . saccharomyces boulardii  250 mg Oral Daily     Assessment/ Plan:  76 y.o. female  with coronary artery disease, congestive heart failure, pulmonary hypertension, parkinson's, diabetes mellitus type II, COPD, DVT, atrial fibrillation, hyperlipidemia, anemia, anxiety, patellar fracture,  1.  Acute renal failure 2.  Chronic kidney disease stage IV.  Baseline creatinine 1.71/GFR 28 on July 14, 2017 3.  Diabetes type 2 with chronic kidney disease 4.  Urinary tract infection, from E. coli   Patient did have IV contrast exposure on July 14, 2017 for a CT angiogram Presenting creatinine is 2.09, improved today slightly to 1.88 Abx for urinary tract infection as per primary team Follow labs No acute indication of HD at present    LOS: 2 [facility] 10/27/201810:16  AM  [facility] [place], [place] [phone]

## 2017-08-08 DIAGNOSIS — N179 Acute kidney failure, unspecified: Secondary | ICD-10-CM | POA: Diagnosis not present

## 2017-08-08 DIAGNOSIS — R410 Disorientation, unspecified: Secondary | ICD-10-CM | POA: Diagnosis not present

## 2017-08-08 LAB — COMPREHENSIVE METABOLIC PANEL
ALBUMIN: 2.6 g/dL — AB (ref 3.5–5.0)
AST: 12 U/L — AB (ref 15–41)
Alkaline Phosphatase: 37 U/L — ABNORMAL LOW (ref 38–126)
Anion gap: 4 — ABNORMAL LOW (ref 5–15)
BUN: 43 mg/dL — AB (ref 6–20)
CO2: 24 mmol/L (ref 22–32)
CREATININE: 1.89 mg/dL — AB (ref 0.44–1.00)
Calcium: 7.9 mg/dL — ABNORMAL LOW (ref 8.9–10.3)
Chloride: 111 mmol/L (ref 101–111)
GFR calc Af Amer: 28 mL/min — ABNORMAL LOW (ref >60)
GFR calc non Af Amer: 25 mL/min — ABNORMAL LOW (ref >60)
GLUCOSE: 186 mg/dL — AB (ref 65–99)
POTASSIUM: 3.8 mmol/L (ref 3.5–5.1)
Sodium: 139 mmol/L (ref 135–145)
Total Bilirubin: 0.6 mg/dL (ref 0.3–1.2)
Total Protein: 4.4 g/dL — ABNORMAL LOW (ref 6.5–8.1)

## 2017-08-08 LAB — CBC
HEMATOCRIT: 26.7 % — AB (ref 35.0–47.0)
Hemoglobin: 8.9 g/dL — ABNORMAL LOW (ref 12.0–16.0)
MCH: 29.2 pg (ref 26.0–34.0)
MCHC: 33.4 g/dL (ref 32.0–36.0)
MCV: 87.5 fL (ref 80.0–100.0)
Platelets: 90 10*3/uL — ABNORMAL LOW (ref 150–440)
RBC: 3.05 MIL/uL — ABNORMAL LOW (ref 3.80–5.20)
RDW: 16.4 % — AB (ref 11.5–14.5)
WBC: 4.3 10*3/uL (ref 3.6–11.0)

## 2017-08-08 LAB — GLUCOSE, CAPILLARY
GLUCOSE-CAPILLARY: 147 mg/dL — AB (ref 65–99)
Glucose-Capillary: 209 mg/dL — ABNORMAL HIGH (ref 65–99)
Glucose-Capillary: 224 mg/dL — ABNORMAL HIGH (ref 65–99)

## 2017-08-08 MED ORDER — TAMSULOSIN HCL 0.4 MG PO CAPS
0.4000 mg | ORAL_CAPSULE | Freq: Every day | ORAL | Status: DC
  Administered 2017-08-08: 0.4 mg via ORAL
  Filled 2017-08-08: qty 1

## 2017-08-08 MED ORDER — CEFUROXIME AXETIL 250 MG PO TABS: 250.0000 mg | ORAL_TABLET | Freq: Two times a day (BID) | ORAL | 0 refills | Status: AC

## 2017-08-08 MED ORDER — RENA-VITE PO TABS: 1.0000 | ORAL_TABLET | Freq: Every day | ORAL | 0 refills | Status: DC

## 2017-08-08 NOTE — Progress Notes (Signed)
[facility], [place] 08/08/17  Subjective:   Patient is doing much better overall Family is at bedside. Patient states she is eating more.  Serum creatinine improved to 1.89 Mental status and energy levels are back to baseline She is scheduled for discharge later today  Objective:  Vital signs in last 24 hours:  Temp:  [98.4 F (36.9 C)] 98.4 F (36.9 C) (10/28 0354) Pulse Rate:  [71-88] 71 (10/28 0354) Resp:  [16] 16 (10/28 0354) BP: (78-104)/(54-76) 104/76 (10/28 0354) SpO2:  [99 %-100 %] 100 % (10/28 0354) Weight:  [57.9 kg (127 lb 11.2 oz)] 57.9 kg (127 lb 11.2 oz) (10/28 0351)  Weight change: 3.402 kg (7 lb 8 oz) Filed Weights   08/06/17 0500 08/07/17 0500 08/08/17 0351  Weight: 53.6 kg (118 lb 1.6 oz) 54.5 kg (120 lb 3.2 oz) 57.9 kg (127 lb 11.2 oz)    Intake/Output:    Intake/Output Summary (Last 24 hours) at 08/08/17 1403 Last data filed at 08/08/17 1356  Gross per 24 hour  Intake           2867.5 ml  Output              450 ml  Net           2417.5 ml     Physical Exam: General:  Chronically ill-appearing, laying in the bed  HEENT  anicteric, moist oral mucous membranes  Neck  supple, no masses  Pulm/lungs  normal breathing effort, mild basilar rhonchi  CVS/Heart  tachycardic, no rub  Abdomen:   Soft, nondistended, mild epigastric tenderness  Extremities:  No peripheral edema  Neurologic:  Alert, able to answer questions and follow commands, Parkinson's tremors  Skin:  No acute rashes   Right IJ PermCath in place       Basic Metabolic Panel:   Recent Labs Lab 08/05/17 1036 08/06/17 0218 08/06/17 1350 08/07/17 0455 08/07/17 1410 08/08/17 0426  NA 138 138  --  139 139 139  K 4.6 4.2  --  3.9 4.0 3.8  CL 103 109  --  110 109 111  CO2 21* 24  --  26 24 24   GLUCOSE 156* 95  --  57* 128* 186*  BUN 44* 48*  --  47* 44* 43*  CREATININE 2.09* 2.09*  --  1.88* 2.04* 1.89*  CALCIUM 8.9 8.1*  --  8.2* 8.0* 7.9*  MG  --    --  1.9  --   --   --   PHOS  --   --  2.8 3.0  --   --      CBC:  Recent Labs Lab 08/05/17 1036 08/06/17 0218 08/07/17 0455 08/07/17 1410 08/08/17 0426  WBC 4.5 3.7 3.2* 3.5* 4.3  HGB 12.0 9.2* 9.1* 9.3* 8.9*  HCT 37.1 28.4* 27.7* 28.5* 26.7*  MCV 88.0 87.1 88.0 88.1 87.5  PLT 162 118* 106* 110* 90*      Lab Results  Component Value Date   HEPBSAG Negative 07/03/2017   HEPBSAB Non Reactive 04/22/2017      Microbiology:  Recent Results (from the past 240 hour(s))  Urine Culture     Status: Abnormal   Collection Time: 08/05/17 10:36 AM  Result Value Ref Range Status   Specimen Description URINE, RANDOM  Final   Special Requests NONE  Final   Culture >=100,000 COLONIES/mL ESCHERICHIA COLI (A)  Final   Report Status 08/07/2017 FINAL  Final   Organism ID, Bacteria ESCHERICHIA  COLI (A)  Final      Susceptibility   Escherichia coli - MIC*    AMPICILLIN >=32 RESISTANT Resistant     CEFAZOLIN <=4 SENSITIVE Sensitive     CEFTRIAXONE <=1 SENSITIVE Sensitive     CIPROFLOXACIN >=4 RESISTANT Resistant     GENTAMICIN <=1 SENSITIVE Sensitive     IMIPENEM <=0.25 SENSITIVE Sensitive     NITROFURANTOIN <=16 SENSITIVE Sensitive     TRIMETH/SULFA >=320 RESISTANT Resistant     AMPICILLIN/SULBACTAM 16 INTERMEDIATE Intermediate     PIP/TAZO <=4 SENSITIVE Sensitive     Extended ESBL NEGATIVE Sensitive     * >=100,000 COLONIES/mL ESCHERICHIA COLI  CULTURE, BLOOD (ROUTINE X 2) w Reflex to ID Panel     Status: None (Preliminary result)   Collection Time: 08/07/17  2:10 PM  Result Value Ref Range Status   Specimen Description BLOOD BLOOD LEFT ARM  Final   Special Requests   Final    BOTTLES DRAWN AEROBIC AND ANAEROBIC Blood Culture adequate volume   Culture NO GROWTH < 24 HOURS  Final   Report Status PENDING  Incomplete  CULTURE, BLOOD (ROUTINE X 2) w Reflex to ID Panel     Status: None (Preliminary result)   Collection Time: 08/07/17  2:10 PM  Result Value Ref Range Status    Specimen Description BLOOD LEFT ANTECUBITAL  Final   Special Requests   Final    BOTTLES DRAWN AEROBIC AND ANAEROBIC Blood Culture adequate volume   Culture NO GROWTH < 24 HOURS  Final   Report Status PENDING  Incomplete    Coagulation Studies: No results for input(s): LABPROT, INR in the last 72 hours.  Urinalysis: No results for input(s): COLORURINE, LABSPEC, PHURINE, GLUCOSEU, HGBUR, BILIRUBINUR, KETONESUR, PROTEINUR, UROBILINOGEN, NITRITE, LEUKOCYTESUR in the last 72 hours.  Invalid input(s): APPERANCEUR    Imaging: Dg Chest 1 View  Result Date: 08/07/2017 CLINICAL DATA:  Encounter for AMS. Patient denies SOB or CP at this time. Hx Parkinson's, HTN, DM, COPD, stent placement. Non-smoker. EXAM: CHEST 1 VIEW COMPARISON:  08/05/2017 FINDINGS: Patient has right-sided dual lumen catheter, tip overlying the level of the superior vena cava. The heart is enlarged and stable in configuration. There are no focal consolidations or pleural effusions. No pulmonary edema. IMPRESSION: Stable cardiomegaly.  No evidence for acute pulmonary abnormality. Electronically Signed   By: Norva PavlovElizabeth  Brown M.D.   On: 08/07/2017 14:33     Medications:   . sodium chloride 75 mL/hr at 08/08/17 0523  . cefTRIAXone (ROCEPHIN)  IV Stopped (08/08/17 0843)   . aspirin EC  81 mg Oral QODAY  . atorvastatin  20 mg Oral QHS  . Carbidopa-Levodopa ER  2 tablet Oral QID  . doxepin  10 mg Oral QHS  . enoxaparin (LOVENOX) injection  30 mg Subcutaneous Q24H  . escitalopram  20 mg Oral Daily  . feeding supplement (NEPRO CARB STEADY)  237 mL Oral TID BM  . gabapentin  300 mg Oral BID  . insulin aspart  0-9 Units Subcutaneous TID WC  . mirabegron ER  25 mg Oral Daily  . multivitamin  1 tablet Oral QHS  . [START ON 08/09/2017] multivitamin with minerals  1 tablet Oral Once per day on Mon Thu  . saccharomyces boulardii  250 mg Oral Daily  . tamsulosin  0.4 mg Oral Daily     Assessment/ Plan:  76 y.o. female  with  coronary artery disease, congestive heart failure, pulmonary hypertension, parkinson's, diabetes mellitus type II, COPD, DVT,  atrial fibrillation, hyperlipidemia, anemia, anxiety, patellar fracture,  1.  Acute renal failure 2.  Chronic kidney disease stage IV.  Baseline creatinine 1.71/GFR 28 on July 14, 2017 3.  Diabetes type 2 with chronic kidney disease 4.  Urinary tract infection, from E. coli   Patient did have IV contrast exposure on July 14, 2017 for a CT angiogram Presenting creatinine is 2.09, stable today at 1.89 Abx for urinary tract infection as per primary team Follow labs No acute indication of HD at present Follow-up as outpatient this week as previously scheduled on 10/31    LOS: 3 Ashlee Player 10/28/20182:03 PM  [facility] [address] [place], [place] [phone]

## 2017-08-08 NOTE — Care Management Note (Signed)
Case Management Note  Patient Details  Name: Molly Wolf MRN: [id number] Date of Birth: October 27, 1940  Subjective/Objective:   Received a call back from [place] who reports that [facility] will accept her insurance and will provide HH=PT, RN, Aide services. [facility] has name and phone number of grandson [facility] [phone], who resides with [name]                  Action/Plan:   Expected Discharge Date:  08/08/17               Expected Discharge Plan:  Home w Home Health Services  In-House Referral:  NA  Discharge planning Services  CM Consult  Post Acute Care Choice:  Home Health Choice offered to:  Adult Children  DME Arranged:  N/A DME Agency:  NA  HH Arranged:  RN, PT, Nurse's Aide (Already has Charity fundraiserN) HH Agency:  [facility]  Status of Service:  Completed, signed off  If discussed at Long Length of Stay Meetings, dates discussed:    Additional Comments:  Kris No A, RN 08/08/2017, 3:04 PM

## 2017-08-08 NOTE — Care Management Note (Addendum)
Case Management Note  Patient Details  Name: Renella CunasJudy Rosell MRN: [id number] Date of Birth: 1941/04/01  Subjective/Objective:   Discussed discharge planning with grandson and [facility]. His cell is [phone]. Mr Lucus asked to be notified when [name] is ready for transport home today. Grandson reports that [name] is [facility] for HH=RN. She has a RW and a wheelchair at home. This Clinical research associatewriter left a phone message requesting a call back [facility] to add additional home health services of PT and Aide. Received a call from [facility] who report that they just closed Mrs Carby's home health services last week, and due to a staffing shortage, they will no longer be able to provide services for her. This Clinical research associatewriter is currently awaiting a call back from [facility]                 Action/Plan:   Expected Discharge Date:  08/08/17               Expected Discharge Plan:  Home w Home Health Services  In-House Referral:  NA  Discharge planning Services  CM Consult  Post Acute Care Choice:  Home Health Choice offered to:  Adult Children  DME Arranged:  N/A DME Agency:  NA  HH Arranged:  RN, PT, Nurse's Aide (Already has Charity fundraiserN) HH Agency[facility]  [facility]  Status of Service:  Completed, signed off  If discussed at Long Length of Stay Meetings, dates discussed:    Additional Comments:  Ravonda Brecheen A, RN 08/08/2017, 1:23 PM

## 2017-08-08 NOTE — Progress Notes (Signed)
MD made aware that pt hasn't voided since cath removal, pt has discharge orders, bladder scan only shows in bladder, MD states ok to discharge pt home with home health, grandson made aware of bladder scan result and MDs order to discharge, grandson agrees with discharge states that pt normally doesn't urinate often at baseline, grandson made aware that per MD if pt doesn't void, has difficulty voiding or discomfort please return to hospital for evaluation grandson states understanding, pt with no complaints

## 2017-08-08 NOTE — Discharge Summary (Signed)
[facility] - [place] at [facility]   PATIENT NAME: Molly Wolf    MR#:  [id number]  DATE OF BIRTH:  25-Sep-1941  DATE OF ADMISSION:  08/05/2017 ADMITTING PHYSICIAN: Adrian Saran, MD  DATE OF DISCHARGE: 08/08/2017  PRIMARY CARE PHYSICIAN: Lauro Regulus, MD    ADMISSION DIAGNOSIS:  Delirium [R41.0] Uremia [N19] Acute cystitis without hematuria [N30.00] Acute renal failure superimposed on chronic kidney disease, unspecified CKD stage, unspecified acute renal failure type (HCC) [N17.9, N18.9]  DISCHARGE DIAGNOSIS:  Active Problems:   Acute kidney injury (HCC)    UTI SECONDARY DIAGNOSIS:   Past Medical History:  Diagnosis Date  . Anxiety   . Arthritis   . Chronic combined systolic and diastolic CHF (congestive heart failure) (HCC)    a. 01/2008 Echo ([facility]): EF > 55%, mod-sev LVH w/ grade 1 DD, biatrial enlargement, mild AS, trace MR/TR; b. EF 45-50%, GR2DD, mild to moderate aortic stenosis, mild aortic insufficieny, mild to moderate mitral regurgitation, mild tricuspid regurgitation, mild biatrial enlargement, and a small pericardial effusion  . CKD (chronic kidney disease), stage IV (HCC)   . Closed patellar sleeve fracture of right knee    a. 01/2017 - conservatively managed.  Marland Kitchen COPD (chronic obstructive pulmonary disease) (HCC)   . Coronary artery disease    a. s/p remote stenting of unknown vessel @ [facility].  . Depression   . Diabetes mellitus with complication (HCC)   . DVT (deep venous thrombosis) (HCC)    a. in the setting of right patellar fracture on Coumadin  . Hyperlipidemia   . Hypertension   . Parkinson disease (HCC)   . Renal insufficiency     HOSPITAL COURSE:   76 year old female with history of Parkinson's disease, CAD chronic kidney disease with history of hemodialysis with recent discontinuation of dialysis, persistent, chronic atrial fibrillation who presents with generalized weakness and hallucinations  1. Acute kidney  injury on chronic kidney disease: significant improvement in renal function Nephrology following No indication for dialysis  2. Auditory and visual hallucinations: This was started after patient started on prednisone Could be due to medications And UTI  continue to monitor symptoms, Much improved - baseline now as per her HCPOA- grandson.  3. UTI: Continue Rocephin and follow up on urine culture.   Give ceftin on d/c.  4. Chronic systolic and diastolic heart failure with ejection fraction of 35%:  she is euvolemic  No ACE inhibitor or ARB in the setting of advanced kidney disease Continue metoprolol- held now due to hypotension.   BP stable. Keep Off the meds. Follow with PMD.  5. Chronic atrial fibrillation complicated by rectus sheath hematoma while on warfarin: Heart rate currently controlled Continue aspirin She has followed by [facility] cardiology  6. Parkinson's disease: Continue Sinemet and doxepin  7. CAD: Continue aspirin and atorvastatin Held metoprolol and isosorbide  8. History of DVT: Status post IVC filter  9. Generalized weakness: Physical therapy consultation requested  10. Elevated troponin: Due to demand ischemia  11. Diabetes: Now Hypoglycemia on 08/07/17 morning. Hold oral medications due to decreased appetite Hold levemir, D50 given, stable now, monitor.  As was on only 7 U levemir- advise to stay off that and Blood sugar stable.  12. Hypotension   Episode on 08/07/17   IV fluid bolus.   No fever. Pt is drowsy.   Check CBC, CMP and Xray chest stat.   Stop all BP meds.  All lab reports are negative, Bl cx negative so far. BP  stable now.  DISCHARGE CONDITIONS:   Stable.  CONSULTS OBTAINED:    DRUG ALLERGIES:   Allergies  Allergen Reactions  . Prednisone Other (See Comments)    halluciantions    DISCHARGE MEDICATIONS:   Current Discharge Medication List    START taking these medications   Details  cefUROXime (CEFTIN) 250  MG tablet Take 1 tablet (250 mg total) by mouth 2 (two) times daily. Qty: 6 tablet, Refills: 0    multivitamin (RENA-VIT) TABS tablet Take 1 tablet by mouth at bedtime. Qty: 30 tablet, Refills: 0      CONTINUE these medications which have NOT CHANGED   Details  aspirin EC 81 MG tablet Take 1 tablet (81 mg total) by mouth daily. Qty: 90 tablet, Refills: 3    atorvastatin (LIPITOR) 20 MG tablet Take 20 mg by mouth at bedtime.     Carbidopa-Levodopa ER (SINEMET CR) 25-100 MG tablet controlled release Take 2 tablets by mouth 4 (four) times daily. Qty: 240 tablet, Refills: 1    Coenzyme Q10 (COQ10) 50 MG CAPS Take 50 mg by mouth daily.    Cranberry 450 MG TABS Take 450 mg by mouth daily.    docusate sodium (COLACE) 100 MG capsule Take 1 capsule (100 mg total) by mouth 2 (two) times daily. Qty: 10 capsule, Refills: 0    doxepin (SINEQUAN) 10 MG capsule Take 10 mg by mouth at bedtime.    escitalopram (LEXAPRO) 20 MG tablet Take 20 mg by mouth daily.    gabapentin (NEURONTIN) 300 MG capsule Take 1 capsule (300 mg total) by mouth 2 (two) times daily. Qty: 180 capsule, Refills: 3    mirabegron ER (MYRBETRIQ) 25 MG TB24 tablet Take 25 mg by mouth daily.    Omega-3 Fatty Acids (FISH OIL) 1000 MG CAPS Take 1,000 mg by mouth daily.    saccharomyces boulardii (FLORASTOR) 250 MG capsule Take 250 mg by mouth daily.    acetaminophen (TYLENOL) 325 MG tablet Take 650 mg by mouth every 6 (six) hours as needed for mild pain.    loratadine (CLARITIN) 10 MG tablet Take 5 mg by mouth daily as needed for allergies.     LORazepam (ATIVAN) 0.5 MG tablet Take 0.5 mg by mouth every 12 (twelve) hours. And as needed for anxiety/pain    nitroGLYCERIN (NITROSTAT) 0.4 MG SL tablet Place 0.4 mg under the tongue every 5 (five) minutes as needed for chest pain.    Nutritional Supplements (FEEDING SUPPLEMENT, NEPRO CARB STEADY,) LIQD Take 237 mLs by mouth 3 (three) times daily between meals. Refills: 0     ondansetron (ZOFRAN ODT) 4 MG disintegrating tablet Take 1 tablet (4 mg total) by mouth every 8 (eight) hours as needed for nausea or vomiting. Qty: 20 tablet, Refills: 0    polyethylene glycol (MIRALAX / GLYCOLAX) packet Take 17 g by mouth daily as needed for mild constipation.    promethazine (PHENERGAN) 25 MG tablet Take 25 mg by mouth every 6 (six) hours as needed for nausea or vomiting.    traMADol (ULTRAM) 50 MG tablet Take 1 tablet (50 mg total) by mouth daily as needed. Qty: 5 tablet, Refills: 0      STOP taking these medications     hydrALAZINE (APRESOLINE) 10 MG tablet      insulin detemir (LEVEMIR) 100 UNIT/ML injection      isosorbide mononitrate (IMDUR) 60 MG 24 hr tablet      metoprolol tartrate (LOPRESSOR) 100 MG tablet      glimepiride (  AMARYL) 1 MG tablet          DISCHARGE INSTRUCTIONS:    Follow with PMD in 1-2 weeks.  If you experience worsening of your admission symptoms, develop shortness of breath, life threatening emergency, suicidal or homicidal thoughts you must seek medical attention immediately by calling 911 or calling your MD immediately  if symptoms less severe.  You Must read complete instructions/literature along with all the possible adverse reactions/side effects for all the Medicines you take and that have been prescribed to you. Take any new Medicines after you have completely understood and accept all the possible adverse reactions/side effects.   Please note  You were cared for by a hospitalist during your hospital stay. If you have any questions about your discharge medications or the care you received while you were in the hospital after you are discharged, you can call the unit and asked to speak with the hospitalist on call if the hospitalist that took care of you is not available. Once you are discharged, your primary care physician will handle any further medical issues. Please note that NO REFILLS for any discharge medications will  be authorized once you are discharged, as it is imperative that you return to your primary care physician (or establish a relationship with a primary care physician if you do not have one) for your aftercare needs so that they can reassess your need for medications and monitor your lab values.    Today   CHIEF COMPLAINT:   Chief Complaint  Patient presents with  . Nausea  . Fatigue    HISTORY OF PRESENT ILLNESS:  Shirleen Mcfaul  is a 76 y.o. female with a known history of chronic systolic heart failure ejection fraction 35% by echocardiogram June 2018, chronic kidney disease stage IV, coronary artery disease and Parkinson's who presents with over a week of auditory and visual hallucinations along with generalized weakness and adult failure to thrive. Patient was on dialysis for approximately 2-3 months. 3 weeks ago she was told by her nephrologist that she did not need dialysis anymore. Since that time patient has had increasing hallucinations, nausea and adult failure to thrive. She denies lower extremity edema or shortness of breath. She denies fever, diarrhea, constipation or abdominal pain.  Her daughter who is at bedside reports that she was given prednisone about a week ago for acute bronchitis and since that time she has had auditory and visual hallucinations. She is found to have UTI here in the ED. It does not appear the patient is on prednisone any longer.   VITAL SIGNS:  Blood pressure 104/76, pulse 71, temperature 98.4 F (36.9 C), temperature source Oral, resp. rate 16, height 5\' 7"  (1.702 m), weight 57.9 kg (127 lb 11.2 oz), SpO2 100 %.  I/O:   Intake/Output Summary (Last 24 hours) at 08/08/17 1315 Last data filed at 08/08/17 1027  Gross per 24 hour  Intake           3367.5 ml  Output              450 ml  Net           2917.5 ml    PHYSICAL EXAMINATION:  GENERAL:  76 y.o.-year-old patient lying in the bed with no acute distress.  EYES: Pupils equal, round, reactive  to light and accommodation. No scleral icterus. Extraocular muscles intact.  HEENT: Head atraumatic, normocephalic. Oropharynx and nasopharynx clear.  NECK:  Supple, no jugular venous distention. No thyroid enlargement, no  tenderness.  LUNGS: Normal breath sounds bilaterally, no wheezing, rales,rhonchi or crepitation. No use of accessory muscles of respiration.  CARDIOVASCULAR: S1, S2 normal. No murmurs, rubs, or gallops.  ABDOMEN: Soft, non-tender, non-distended. Bowel sounds present. No organomegaly or mass.  EXTREMITIES: No pedal edema, cyanosis, or clubbing.  NEUROLOGIC: Cranial nerves II through XII are intact. Muscle strength 3-4/5 in all extremities. Sensation intact. Gait not checked.  PSYCHIATRIC: The patient is alert and oriented x 3.  SKIN: No obvious rash, lesion, or ulcer.   DATA REVIEW:   CBC  Recent Labs Lab 08/08/17 0426  WBC 4.3  HGB 8.9*  HCT 26.7*  PLT 90*    Chemistries   Recent Labs Lab 08/06/17 1350  08/08/17 0426  NA  --   < > 139  K  --   < > 3.8  CL  --   < > 111  CO2  --   < > 24  GLUCOSE  --   < > 186*  BUN  --   < > 43*  CREATININE  --   < > 1.89*  CALCIUM  --   < > 7.9*  MG 1.9  --   --   AST  --   < > 12*  ALT  --   < > <5*  ALKPHOS  --   < > 37*  BILITOT  --   < > 0.6  < > = values in this interval not displayed.  Cardiac Enzymes  Recent Labs Lab 08/06/17 0218  TROPONINI 0.03*    Microbiology Results  Results for orders placed or performed during the hospital encounter of 08/05/17  Urine Culture     Status: Abnormal   Collection Time: 08/05/17 10:36 AM  Result Value Ref Range Status   Specimen Description URINE, RANDOM  Final   Special Requests NONE  Final   Culture >=100,000 COLONIES/mL ESCHERICHIA COLI (A)  Final   Report Status 08/07/2017 FINAL  Final   Organism ID, Bacteria ESCHERICHIA COLI (A)  Final      Susceptibility   Escherichia coli - MIC*    AMPICILLIN >=32 RESISTANT Resistant     CEFAZOLIN <=4 SENSITIVE  Sensitive     CEFTRIAXONE <=1 SENSITIVE Sensitive     CIPROFLOXACIN >=4 RESISTANT Resistant     GENTAMICIN <=1 SENSITIVE Sensitive     IMIPENEM <=0.25 SENSITIVE Sensitive     NITROFURANTOIN <=16 SENSITIVE Sensitive     TRIMETH/SULFA >=320 RESISTANT Resistant     AMPICILLIN/SULBACTAM 16 INTERMEDIATE Intermediate     PIP/TAZO <=4 SENSITIVE Sensitive     Extended ESBL NEGATIVE Sensitive     * >=100,000 COLONIES/mL ESCHERICHIA COLI  CULTURE, BLOOD (ROUTINE X 2) w Reflex to ID Panel     Status: None (Preliminary result)   Collection Time: 08/07/17  2:10 PM  Result Value Ref Range Status   Specimen Description BLOOD BLOOD LEFT ARM  Final   Special Requests   Final    BOTTLES DRAWN AEROBIC AND ANAEROBIC Blood Culture adequate volume   Culture NO GROWTH < 24 HOURS  Final   Report Status PENDING  Incomplete  CULTURE, BLOOD (ROUTINE X 2) w Reflex to ID Panel     Status: None (Preliminary result)   Collection Time: 08/07/17  2:10 PM  Result Value Ref Range Status   Specimen Description BLOOD LEFT ANTECUBITAL  Final   Special Requests   Final    BOTTLES DRAWN AEROBIC AND ANAEROBIC Blood Culture adequate volume   Culture  NO GROWTH < 24 HOURS  Final   Report Status PENDING  Incomplete    RADIOLOGY:  Dg Chest 1 View  Result Date: 08/07/2017 CLINICAL DATA:  Encounter for AMS. Patient denies SOB or CP at this time. Hx Parkinson's, HTN, DM, COPD, stent placement. Non-smoker. EXAM: CHEST 1 VIEW COMPARISON:  08/05/2017 FINDINGS: Patient has right-sided dual lumen catheter, tip overlying the level of the superior vena cava. The heart is enlarged and stable in configuration. There are no focal consolidations or pleural effusions. No pulmonary edema. IMPRESSION: Stable cardiomegaly.  No evidence for acute pulmonary abnormality. Electronically Signed   By: Norva Pavlov M.D.   On: 08/07/2017 14:33    EKG:   Orders placed or performed during the hospital encounter of 08/05/17  . ED EKG  . ED EKG   . EKG 12-Lead  . EKG 12-Lead      Management plans discussed with the patient, family and they are in agreement.  CODE STATUS:     Code Status Orders        Start     Ordered   08/05/17 1416  Do not attempt resuscitation (DNR)  Continuous    Question Answer Comment  In the event of cardiac or respiratory ARREST Do not call a "code blue"   In the event of cardiac or respiratory ARREST Do not perform Intubation, CPR, defibrillation or ACLS   In the event of cardiac or respiratory ARREST Use medication by any route, position, wound care, and other measures to relive pain and suffering. May use oxygen, suction and manual treatment of airway obstruction as needed for comfort.      08/05/17 1415    Code Status History    Date Active Date Inactive Code Status Order ID Comments User Context   07/02/2017  5:09 PM 07/03/2017 11:13 PM Full Code [id number]  Ramonita Lab, MD Inpatient   04/10/2017  3:27 AM 04/27/2017  9:50 PM Full Code [id number]  Arnaldo Natal, MD ED   04/03/2017 11:30 PM 04/09/2017 10:09 PM Full Code [id number]  Hugelmeyer, Alexis, DO ED   02/15/2017  6:16 PM 02/16/2017  6:18 PM Full Code [id number]  Altamese Dilling, MD Inpatient   01/26/2017  2:44 PM 01/29/2017  9:47 PM Full Code [id number]  Alford Highland, MD ED   12/28/2016  7:44 AM 12/29/2016  4:34 PM DNR [id number]  Delfino Lovett, MD Inpatient   12/27/2016 11:06 PM 12/28/2016  7:44 AM Full Code [id number]  Hugelmeyer, Jon Gills, DO Inpatient      TOTAL TIME TAKING CARE OF THIS PATIENT: 35 minutes.    Altamese Dilling M.D on 08/08/2017 at 1:15 PM  Between 7am to 6pm - Pager - [phone]  After 6pm go to [email or web address] - Social research officer, government  [facility]  Office  [phone]  CC: Primary care physician; Lauro Regulus, MD   Note: This dictation was prepared with Dragon dictation along with smaller phrase technology. Any transcriptional errors that result from this process are  unintentional.

## 2017-08-09 ENCOUNTER — Other Ambulatory Visit: Payer: Self-pay | Admitting: Family

## 2017-08-09 MED ORDER — ATORVASTATIN CALCIUM 20 MG PO TABS
20.0000 mg | ORAL_TABLET | Freq: Every day | ORAL | 3 refills | Status: DC
Start: 2017-08-09 — End: 2017-10-04

## 2017-08-09 NOTE — Care Management (Signed)
Daughter of Ms. [name] came by the hospital. Would like to change home services from [facility] to [facility]. States she has already informed [facility] not to come to the home. Will fax [facility] Gwenette GreetBrenda S Tamarcus Condie RN MSN CCM Care Management [phone]

## 2017-08-10 ENCOUNTER — Ambulatory Visit: Payer: Medicare Other | Admitting: Family

## 2017-08-10 LAB — MISC LABCORP TEST (SEND OUT): Labcorp test code: 1479

## 2017-08-11 ENCOUNTER — Encounter: Payer: Self-pay | Admitting: Dietician

## 2017-08-11 NOTE — Progress Notes (Unsigned)
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 Spoke to Dr. Wynelle LinkKolluru; notified him of lab results. Recommend Vitamin C 500mg  BID PO for 10 days followed by vitamin C 500mg  PO daily. Recommend recheck ascorbic acid labs in 3 months.   Betsey Holidayasey Salaam Battershell MS, RD, LDN Pager #- [phone] After Hours Pager: [phone]

## 2017-08-12 LAB — CULTURE, BLOOD (ROUTINE X 2)
CULTURE: NO GROWTH
CULTURE: NO GROWTH
Special Requests: ADEQUATE
Special Requests: ADEQUATE

## 2017-09-14 ENCOUNTER — Encounter (INDEPENDENT_AMBULATORY_CARE_PROVIDER_SITE_OTHER): Payer: Self-pay

## 2017-09-14 ENCOUNTER — Other Ambulatory Visit (INDEPENDENT_AMBULATORY_CARE_PROVIDER_SITE_OTHER): Payer: Self-pay | Admitting: Vascular Surgery

## 2017-09-23 ENCOUNTER — Ambulatory Visit
Admission: RE | Admit: 2017-09-23 | Discharge: 2017-09-23 | Disposition: A | Discharge status: Home / Self Care | Payer: Medicare Other | Source: Ambulatory Visit | Attending: Vascular Surgery | Admitting: Vascular Surgery

## 2017-09-23 ENCOUNTER — Encounter: Payer: Self-pay | Admitting: Vascular Surgery

## 2017-09-23 ENCOUNTER — Encounter
Admission: RE | Disposition: A | Discharge status: Home / Self Care | Payer: Self-pay | Source: Ambulatory Visit | Attending: Vascular Surgery

## 2017-09-23 DIAGNOSIS — Z841 Family history of disorders of kidney and ureter: Secondary | ICD-10-CM | POA: Insufficient documentation

## 2017-09-23 DIAGNOSIS — Z79899 Other long term (current) drug therapy: Secondary | ICD-10-CM

## 2017-09-23 DIAGNOSIS — F419 Anxiety disorder, unspecified: Secondary | ICD-10-CM

## 2017-09-23 DIAGNOSIS — M1990 Unspecified osteoarthritis, unspecified site: Secondary | ICD-10-CM | POA: Insufficient documentation

## 2017-09-23 DIAGNOSIS — Z8049 Family history of malignant neoplasm of other genital organs: Secondary | ICD-10-CM

## 2017-09-23 DIAGNOSIS — I5042 Chronic combined systolic (congestive) and diastolic (congestive) heart failure: Secondary | ICD-10-CM

## 2017-09-23 DIAGNOSIS — Z992 Dependence on renal dialysis: Secondary | ICD-10-CM | POA: Insufficient documentation

## 2017-09-23 DIAGNOSIS — E1122 Type 2 diabetes mellitus with diabetic chronic kidney disease: Secondary | ICD-10-CM | POA: Insufficient documentation

## 2017-09-23 DIAGNOSIS — R0602 Shortness of breath: Secondary | ICD-10-CM | POA: Diagnosis not present

## 2017-09-23 DIAGNOSIS — Z9889 Other specified postprocedural states: Secondary | ICD-10-CM

## 2017-09-23 DIAGNOSIS — N186 End stage renal disease: Secondary | ICD-10-CM | POA: Diagnosis not present

## 2017-09-23 DIAGNOSIS — E119 Type 2 diabetes mellitus without complications: Secondary | ICD-10-CM | POA: Diagnosis not present

## 2017-09-23 DIAGNOSIS — I2510 Atherosclerotic heart disease of native coronary artery without angina pectoris: Secondary | ICD-10-CM | POA: Insufficient documentation

## 2017-09-23 DIAGNOSIS — I10 Essential (primary) hypertension: Secondary | ICD-10-CM | POA: Diagnosis not present

## 2017-09-23 DIAGNOSIS — Z888 Allergy status to other drugs, medicaments and biological substances status: Secondary | ICD-10-CM

## 2017-09-23 DIAGNOSIS — Z452 Encounter for adjustment and management of vascular access device: Secondary | ICD-10-CM

## 2017-09-23 DIAGNOSIS — E785 Hyperlipidemia, unspecified: Secondary | ICD-10-CM | POA: Insufficient documentation

## 2017-09-23 DIAGNOSIS — F329 Major depressive disorder, single episode, unspecified: Secondary | ICD-10-CM

## 2017-09-23 DIAGNOSIS — Z955 Presence of coronary angioplasty implant and graft: Secondary | ICD-10-CM

## 2017-09-23 DIAGNOSIS — Z86718 Personal history of other venous thrombosis and embolism: Secondary | ICD-10-CM | POA: Insufficient documentation

## 2017-09-23 DIAGNOSIS — Z7982 Long term (current) use of aspirin: Secondary | ICD-10-CM

## 2017-09-23 DIAGNOSIS — E11649 Type 2 diabetes mellitus with hypoglycemia without coma: Secondary | ICD-10-CM

## 2017-09-23 DIAGNOSIS — J449 Chronic obstructive pulmonary disease, unspecified: Secondary | ICD-10-CM

## 2017-09-23 DIAGNOSIS — G20 Parkinson's disease: Secondary | ICD-10-CM | POA: Insufficient documentation

## 2017-09-23 DIAGNOSIS — Z8249 Family history of ischemic heart disease and other diseases of the circulatory system: Secondary | ICD-10-CM

## 2017-09-23 DIAGNOSIS — I132 Hypertensive heart and chronic kidney disease with heart failure and with stage 5 chronic kidney disease, or end stage renal disease: Secondary | ICD-10-CM

## 2017-09-23 DIAGNOSIS — A419 Sepsis, unspecified organism: Secondary | ICD-10-CM | POA: Diagnosis not present

## 2017-09-23 HISTORY — PX: DIALYSIS/PERMA CATHETER REMOVAL: CATH118289

## 2017-09-23 SURGERY — DIALYSIS/PERMA CATHETER REMOVAL
Anesthesia: LOCAL

## 2017-09-23 MED ORDER — LIDOCAINE-EPINEPHRINE (PF) 1 %-1:200000 IJ SOLN
INTRAMUSCULAR | Status: DC | PRN
  Administered 2017-09-23: 20 mL via INTRADERMAL

## 2017-09-23 SURGICAL SUPPLY — 2 items
FORCEPS HALSTEAD CVD 5IN STRL (INSTRUMENTS) ×2 IMPLANT
TRAY LACERAT/PLASTIC (MISCELLANEOUS) ×2 IMPLANT

## 2017-09-23 NOTE — H&P (Signed)
[facility] Admission History & Physical  MRN : [id number]  Molly CunasJudy Wolf is a 76 y.o. ([birth year]) female who presents with chief complaint of No chief complaint on file. Molly Wolf.  History of Present Illness: I am asked to evaluate the patient by the dialysis center. The patient was sent here because they have a nonfunctioning tunneled catheter and she no longer needs it for dialysis.  Patient denies pain or tenderness overlying the access.  There is no pain with dialysis.  The patient denies hand pain or finger pain consistent with steal syndrome.  No fevers or chills while on dialysis.   No current facility-administered medications for this encounter.    Current Outpatient Medications  Medication Sig Dispense Refill  . acetaminophen (TYLENOL) 325 MG tablet Take 650 mg by mouth every 6 (six) hours as needed for mild pain.    Molly Wolf. aspirin EC 81 MG tablet Take 1 tablet (81 mg total) by mouth daily. (Patient taking differently: Take 81 mg by mouth every other day. ) 90 tablet 3  . atorvastatin (LIPITOR) 20 MG tablet Take 1 tablet (20 mg total) by mouth at bedtime. 90 tablet 3  . Carbidopa-Levodopa ER (SINEMET CR) 25-100 MG tablet controlled release Take 2 tablets by mouth 4 (four) times daily. 240 tablet 1  . Coenzyme Q10 (COQ10) 50 MG CAPS Take 50 mg by mouth daily.    . Cranberry 450 MG TABS Take 450 mg by mouth daily.    Molly Wolf. docusate sodium (COLACE) 100 MG capsule Take 1 capsule (100 mg total) by mouth 2 (two) times daily. (Patient taking differently: Take 100 mg by mouth 2 (two) times daily as needed for moderate constipation. ) 10 capsule 0  . doxepin (SINEQUAN) 10 MG capsule Take 10 mg by mouth at bedtime.    Molly Wolf. escitalopram (LEXAPRO) 20 MG tablet Take 20 mg by mouth daily.    Molly Wolf. gabapentin (NEURONTIN) 300 MG capsule Take 1 capsule (300 mg total) by mouth 2 (two) times daily. 180 capsule 3  . loratadine (CLARITIN) 10 MG tablet Take 5 mg by mouth daily as needed for  allergies.     Molly Wolf. LORazepam (ATIVAN) 0.5 MG tablet Take 0.5 mg by mouth every 12 (twelve) hours. And as needed for anxiety/pain    . mirabegron ER (MYRBETRIQ) 25 MG TB24 tablet Take 25 mg by mouth daily.    . multivitamin (RENA-VIT) TABS tablet Take 1 tablet by mouth at bedtime. 30 tablet 0  . nitroGLYCERIN (NITROSTAT) 0.4 MG SL tablet Place 0.4 mg under the tongue every 5 (five) minutes as needed for chest pain.    . Nutritional Supplements (FEEDING SUPPLEMENT, NEPRO CARB STEADY,) LIQD Take 237 mLs by mouth 3 (three) times daily between meals.  0  . Omega-3 Fatty Acids (FISH OIL) 1000 MG CAPS Take 1,000 mg by mouth daily.    . ondansetron (ZOFRAN ODT) 4 MG disintegrating tablet Take 1 tablet (4 mg total) by mouth every 8 (eight) hours as needed for nausea or vomiting. 20 tablet 0  . polyethylene glycol (MIRALAX / GLYCOLAX) packet Take 17 g by mouth daily as needed for mild constipation.    . promethazine (PHENERGAN) 25 MG tablet Take 25 mg by mouth every 6 (six) hours as needed for nausea or vomiting.    . saccharomyces boulardii (FLORASTOR) 250 MG capsule Take 250 mg by mouth daily.    . traMADol (ULTRAM) 50 MG tablet Take 1 tablet (50 mg total) by mouth daily as needed. (  Patient taking differently: Take 50 mg by mouth daily as needed for moderate pain. ) 5 tablet 0    Past Medical History:  Diagnosis Date  . Anxiety   . Arthritis   . Chronic combined systolic and diastolic CHF (congestive heart failure) (HCC)    a. 01/2008 Echo ([facility]): EF > 55%, mod-sev LVH w/ grade 1 DD, biatrial enlargement, mild AS, trace MR/TR; b. EF 45-50%, GR2DD, mild to moderate aortic stenosis, mild aortic insufficieny, mild to moderate mitral regurgitation, mild tricuspid regurgitation, mild biatrial enlargement, and a small pericardial effusion  . CKD (chronic kidney disease), stage IV (HCC)   . Closed patellar sleeve fracture of right knee    a. 01/2017 - conservatively managed.  Molly Wolf COPD (chronic obstructive  pulmonary disease) (HCC)   . Coronary artery disease    a. s/p remote stenting of unknown vessel @ [facility].  . Depression   . Diabetes mellitus with complication (HCC)   . DVT (deep venous thrombosis) (HCC)    a. in the setting of right patellar fracture on Coumadin  . Hyperlipidemia   . Hypertension   . Parkinson disease (HCC)   . Renal insufficiency     Past Surgical History:  Procedure Laterality Date  . BACK SURGERY    . CORONARY ANGIOPLASTY WITH STENT PLACEMENT    . DIALYSIS/PERMA CATHETER INSERTION N/A 04/22/2017   Procedure: Dialysis/Perma Catheter Insertion;  Surgeon: Annice Needy, MD;  Location: [facility] INVASIVE CV LAB;  Service: Cardiovascular;  Laterality: N/A;  . ESOPHAGOGASTRODUODENOSCOPY N/A 04/09/2017   Procedure: ESOPHAGOGASTRODUODENOSCOPY (EGD);  Surgeon: Wyline Mood, MD;  Location: [facility] ENDOSCOPY;  Service: Endoscopy;  Laterality: N/A;  . IVC FILTER INSERTION N/A 04/23/2017   Procedure: IVC Filter Insertion;  Surgeon: Renford Dills, MD;  Location: [facility] INVASIVE CV LAB;  Service: Cardiovascular;  Laterality: N/A;  . NECK SURGERY      Social History Social History   Tobacco Use  . Smoking status: Never Smoker  . Smokeless tobacco: Never Used  Substance Use Topics  . Alcohol use: No  . Drug use: No    Family History Family History  Problem Relation Age of Onset  . Cervical cancer Mother   . Kidney failure Father   . CAD Father   . CAD Brother   . Prostate cancer Neg Hx   . Kidney cancer Neg Hx   . Bladder Cancer Neg Hx     No family history of bleeding or clotting disorders, autoimmune disease or porphyria  Allergies  Allergen Reactions  . Prednisone Other (See Comments)    halluciantions     REVIEW OF SYSTEMS (Negative unless checked)  Constitutional: [] Weight loss  [] Fever  [] Chills Cardiac: [] Chest pain   [] Chest pressure   [] Palpitations   [] Shortness of breath when laying flat   [] Shortness of breath at rest   [x] Shortness of breath with  exertion. Vascular:  [] Pain in legs with walking   [] Pain in legs at rest   [] Pain in legs when laying flat   [] Claudication   [] Pain in feet when walking  [] Pain in feet at rest  [] Pain in feet when laying flat   [] History of DVT   [] Phlebitis   [] Swelling in legs   [] Varicose veins   [] Non-healing ulcers Pulmonary:   [] Uses home oxygen   [] Productive cough   [] Hemoptysis   [] Wheeze  [] COPD   [] Asthma Neurologic:  [] Dizziness  [] Blackouts   [] Seizures   [] History of stroke   [] History of TIA  []   Aphasia   [] Temporary blindness   [] Dysphagia   [] Weakness or numbness in arms   [] Weakness or numbness in legs Musculoskeletal:  [x] Arthritis   [] Joint swelling   [] Joint pain   [] Low back pain Hematologic:  [] Easy bruising  [] Easy bleeding   [] Hypercoagulable state   [] Anemic  [] Hepatitis Gastrointestinal:  [] Blood in stool   [] Vomiting blood  [] Gastroesophageal reflux/heartburn   [] Difficulty swallowing. Genitourinary:  [x] Chronic kidney disease   [] Difficult urination  [] Frequent urination  [] Burning with urination   [] Blood in urine Skin:  [] Rashes   [] Ulcers   [] Wounds Psychological:  [x] History of anxiety   [x]  History of major depression.  Physical Examination  Vitals:   09/23/17 1126 09/23/17 1157  BP: (!) 130/107 (!) 166/118  Pulse: (!) 114 (!) 123  Resp: 19   Temp: (!) 94.5 F (34.7 C)   TempSrc: Axillary Oral   There is no height or weight on file to calculate BMI. Gen: Frail and debilitated, NAD Head: [place]/AT, + temporalis wasting.  Ear/Nose/Throat: Hearing grossly intact, nares w/o erythema or drainage, oropharynx w/o Erythema/Exudate,  Eyes: Conjunctiva clear, sclera non-icteric Neck: Trachea midline.  No JVD.  Pulmonary:  Good air movement, respirations not labored, no use of accessory muscles.  Cardiac: RRR, normal S1, S2. Vascular:  Vessel Right Left  Radial Palpable Palpable               Musculoskeletal: M/S 5/5 throughout.  Extremities without ischemic changes.  No  deformity or atrophy.  Neurologic: Sensation grossly intact in extremities.  Symmetrical.  Speech is fluent. Motor exam as listed above. Psychiatric: Judgment intact, Mood & affect appropriate for pt's clinical situation. Dermatologic: No rashes or ulcers noted.  No cellulitis or open wounds.    CBC Lab Results  Component Value Date   WBC 4.3 08/08/2017   HGB 8.9 (L) 08/08/2017   HCT 26.7 (L) 08/08/2017   MCV 87.5 08/08/2017   PLT 90 (L) 08/08/2017    BMET    Component Value Date/Time   NA 139 08/08/2017 0426   K 3.8 08/08/2017 0426   CL 111 08/08/2017 0426   CO2 24 08/08/2017 0426   GLUCOSE 186 (H) 08/08/2017 0426   BUN 43 (H) 08/08/2017 0426   CREATININE 1.89 (H) 08/08/2017 0426   CALCIUM 7.9 (L) 08/08/2017 0426   GFRNONAA 25 (L) 08/08/2017 0426   GFRAA 28 (L) 08/08/2017 0426   CrCl cannot be calculated (Patient's most recent lab result is older than the maximum 21 days allowed.).  COAG Lab Results  Component Value Date   INR 2.33 04/27/2017   INR 2.71 04/26/2017   INR 2.71 04/24/2017    Radiology No results found.  Assessment/Plan 1.  Complication dialysis device with thrombosis AV access:  Patient's Right jugular Tunneled catheter is not being used. Therefore, the patient will undergo removal of the tunneled catheter under local anesthesia.  The risks and benefits were described to the patient.  All questions were answered.  The patient agrees to proceed with angiography and intervention. Potassium will be drawn to ensure that it is an appropriate level prior to performing intervention. 2.  Hypertension:  Patient will continue medical management; nephrology is following no changes in oral medications. 3.  Diabetes mellitus:  Glucose will be monitored and oral medications been held this morning once the patient has undergone the patient's procedure po intake will be reinitiated and again Accu-Cheks will be used to assess the blood glucose level and treat as needed.  The  patient will be restarted on the patient's usual hypoglycemic regime     Festus Barren, MD  09/23/2017 12:01 PM

## 2017-09-23 NOTE — Op Note (Signed)
Operative Note     Preoperative diagnosis:   1. Renal failure with improvement no longer using her permcath  Postoperative diagnosis:  1. Same as above  Procedure:  Removal of right jugular Permcath  Surgeon:  Festus BarrenJason Karolynn Infantino, MD  Anesthesia:  Local  EBL:  Minimal  Indication for the Procedure:  The patient has a permcath in place and her renal function is improved and no longer needs their permcath.  This can be removed.  Risks and benefits are discussed and informed consent is obtained.  Description of the Procedure:  The patient's right neck, chest and existing catheter were sterilely prepped and draped. The area around the catheter was anesthetized copiously with 1% lidocaine. The catheter was dissected out with curved hemostats until the cuff was freed from the surrounding fibrous sheath. The fiber sheath was transected, and the catheter was then removed in its entirety using gentle traction. Pressure was held and sterile dressings were placed. The patient tolerated the procedure well and was taken to the recovery room in stable condition.     Festus BarrenJason Haedyn Breau  09/23/2017, 12:04 PM This note was created with Dragon Medical transcription system. Any errors in dictation are purely unintentional.

## 2017-09-23 NOTE — Discharge Instructions (Signed)

## 2017-09-25 ENCOUNTER — Emergency Department: Payer: Medicare Other

## 2017-09-25 ENCOUNTER — Inpatient Hospital Stay
Admission: EM | Admit: 2017-09-25 | Discharge: 2017-10-04 | DRG: 853 | Disposition: A | Discharge status: Hospice | Payer: Medicare Other | Attending: Internal Medicine | Admitting: Internal Medicine

## 2017-09-25 ENCOUNTER — Encounter: Payer: Self-pay | Admitting: Internal Medicine

## 2017-09-25 DIAGNOSIS — I5042 Chronic combined systolic (congestive) and diastolic (congestive) heart failure: Secondary | ICD-10-CM | POA: Diagnosis present

## 2017-09-25 DIAGNOSIS — R0602 Shortness of breath: Secondary | ICD-10-CM

## 2017-09-25 DIAGNOSIS — Z8249 Family history of ischemic heart disease and other diseases of the circulatory system: Secondary | ICD-10-CM

## 2017-09-25 DIAGNOSIS — J449 Chronic obstructive pulmonary disease, unspecified: Secondary | ICD-10-CM | POA: Diagnosis present

## 2017-09-25 DIAGNOSIS — J9622 Acute and chronic respiratory failure with hypercapnia: Secondary | ICD-10-CM | POA: Diagnosis present

## 2017-09-25 DIAGNOSIS — J9602 Acute respiratory failure with hypercapnia: Secondary | ICD-10-CM

## 2017-09-25 DIAGNOSIS — Z993 Dependence on wheelchair: Secondary | ICD-10-CM

## 2017-09-25 DIAGNOSIS — E872 Acidosis: Secondary | ICD-10-CM | POA: Diagnosis present

## 2017-09-25 DIAGNOSIS — Z86718 Personal history of other venous thrombosis and embolism: Secondary | ICD-10-CM | POA: Diagnosis not present

## 2017-09-25 DIAGNOSIS — E1122 Type 2 diabetes mellitus with diabetic chronic kidney disease: Secondary | ICD-10-CM | POA: Diagnosis present

## 2017-09-25 DIAGNOSIS — G20 Parkinson's disease: Secondary | ICD-10-CM

## 2017-09-25 DIAGNOSIS — J9601 Acute respiratory failure with hypoxia: Secondary | ICD-10-CM

## 2017-09-25 DIAGNOSIS — N184 Chronic kidney disease, stage 4 (severe): Secondary | ICD-10-CM | POA: Diagnosis present

## 2017-09-25 DIAGNOSIS — Z841 Family history of disorders of kidney and ureter: Secondary | ICD-10-CM | POA: Diagnosis not present

## 2017-09-25 DIAGNOSIS — I4891 Unspecified atrial fibrillation: Secondary | ICD-10-CM | POA: Diagnosis present

## 2017-09-25 DIAGNOSIS — J9621 Acute and chronic respiratory failure with hypoxia: Secondary | ICD-10-CM | POA: Diagnosis present

## 2017-09-25 DIAGNOSIS — Z515 Encounter for palliative care: Secondary | ICD-10-CM | POA: Diagnosis not present

## 2017-09-25 DIAGNOSIS — J690 Pneumonitis due to inhalation of food and vomit: Secondary | ICD-10-CM | POA: Diagnosis not present

## 2017-09-25 DIAGNOSIS — Z888 Allergy status to other drugs, medicaments and biological substances status: Secondary | ICD-10-CM

## 2017-09-25 DIAGNOSIS — Z66 Do not resuscitate: Secondary | ICD-10-CM | POA: Diagnosis present

## 2017-09-25 DIAGNOSIS — I130 Hypertensive heart and chronic kidney disease with heart failure and stage 1 through stage 4 chronic kidney disease, or unspecified chronic kidney disease: Secondary | ICD-10-CM | POA: Diagnosis present

## 2017-09-25 DIAGNOSIS — J90 Pleural effusion, not elsewhere classified: Secondary | ICD-10-CM

## 2017-09-25 DIAGNOSIS — Z8049 Family history of malignant neoplasm of other genital organs: Secondary | ICD-10-CM

## 2017-09-25 DIAGNOSIS — I361 Nonrheumatic tricuspid (valve) insufficiency: Secondary | ICD-10-CM | POA: Diagnosis not present

## 2017-09-25 DIAGNOSIS — E785 Hyperlipidemia, unspecified: Secondary | ICD-10-CM | POA: Diagnosis present

## 2017-09-25 DIAGNOSIS — Z7982 Long term (current) use of aspirin: Secondary | ICD-10-CM | POA: Diagnosis not present

## 2017-09-25 DIAGNOSIS — M1990 Unspecified osteoarthritis, unspecified site: Secondary | ICD-10-CM | POA: Diagnosis present

## 2017-09-25 DIAGNOSIS — A419 Sepsis, unspecified organism: Secondary | ICD-10-CM | POA: Diagnosis present

## 2017-09-25 DIAGNOSIS — J9600 Acute respiratory failure, unspecified whether with hypoxia or hypercapnia: Secondary | ICD-10-CM | POA: Diagnosis present

## 2017-09-25 DIAGNOSIS — I2510 Atherosclerotic heart disease of native coronary artery without angina pectoris: Secondary | ICD-10-CM | POA: Diagnosis present

## 2017-09-25 DIAGNOSIS — Z7189 Other specified counseling: Secondary | ICD-10-CM | POA: Diagnosis not present

## 2017-09-25 DIAGNOSIS — F419 Anxiety disorder, unspecified: Secondary | ICD-10-CM | POA: Diagnosis present

## 2017-09-25 DIAGNOSIS — N179 Acute kidney failure, unspecified: Secondary | ICD-10-CM | POA: Diagnosis not present

## 2017-09-25 DIAGNOSIS — Z955 Presence of coronary angioplasty implant and graft: Secondary | ICD-10-CM | POA: Diagnosis not present

## 2017-09-25 DIAGNOSIS — R1310 Dysphagia, unspecified: Secondary | ICD-10-CM | POA: Diagnosis present

## 2017-09-25 DIAGNOSIS — E875 Hyperkalemia: Secondary | ICD-10-CM | POA: Diagnosis present

## 2017-09-25 DIAGNOSIS — R6521 Severe sepsis with septic shock: Secondary | ICD-10-CM | POA: Diagnosis present

## 2017-09-25 DIAGNOSIS — E43 Unspecified severe protein-calorie malnutrition: Secondary | ICD-10-CM | POA: Diagnosis present

## 2017-09-25 DIAGNOSIS — Z6820 Body mass index (BMI) 20.0-20.9, adult: Secondary | ICD-10-CM

## 2017-09-25 DIAGNOSIS — F329 Major depressive disorder, single episode, unspecified: Secondary | ICD-10-CM | POA: Diagnosis present

## 2017-09-25 DIAGNOSIS — I340 Nonrheumatic mitral (valve) insufficiency: Secondary | ICD-10-CM | POA: Diagnosis not present

## 2017-09-25 LAB — PROTIME-INR
INR: 1.19
Prothrombin Time: 15 seconds (ref 11.4–15.2)

## 2017-09-25 LAB — HEPATIC FUNCTION PANEL
ALT: <5 U/L — ABNORMAL LOW (ref 14–54)
AST: 19 U/L (ref 15–41)
Albumin: 3.3 g/dL — ABNORMAL LOW (ref 3.5–5.0)
Alkaline Phosphatase: 63 U/L (ref 38–126)
BILIRUBIN DIRECT: 0.2 mg/dL (ref 0.1–0.5)
BILIRUBIN INDIRECT: 0.8 mg/dL (ref 0.3–0.9)
TOTAL PROTEIN: 6.2 g/dL — AB (ref 6.5–8.1)
Total Bilirubin: 1 mg/dL (ref 0.3–1.2)

## 2017-09-25 LAB — BLOOD GAS, ARTERIAL
Acid-base deficit: 8.6 mmol/L — ABNORMAL HIGH (ref 0.0–2.0)
Bicarbonate: 20.4 mmol/L (ref 20.0–28.0)
FIO2: 0.36
O2 Saturation: 75.5 %
PATIENT TEMPERATURE: 37
pCO2 arterial: 56 mmHg — ABNORMAL HIGH (ref 32.0–48.0)
pH, Arterial: 7.17 — CL (ref 7.350–7.450)
pO2, Arterial: 52 mmHg — ABNORMAL LOW (ref 83.0–108.0)

## 2017-09-25 LAB — BASIC METABOLIC PANEL
ANION GAP: 5 (ref 5–15)
ANION GAP: 7 (ref 5–15)
BUN: 50 mg/dL — ABNORMAL HIGH (ref 6–20)
BUN: 50 mg/dL — ABNORMAL HIGH (ref 6–20)
CALCIUM: 8.8 mg/dL — AB (ref 8.9–10.3)
CALCIUM: 8 mg/dL — AB (ref 8.9–10.3)
CO2: 22 mmol/L (ref 22–32)
CO2: 21 mmol/L — ABNORMAL LOW (ref 22–32)
Chloride: 110 mmol/L (ref 101–111)
Chloride: 111 mmol/L (ref 101–111)
Creatinine, Ser: 2.06 mg/dL — ABNORMAL HIGH (ref 0.44–1.00)
Creatinine, Ser: 1.94 mg/dL — ABNORMAL HIGH (ref 0.44–1.00)
GFR, EST AFRICAN AMERICAN: 26 mL/min — AB (ref >60)
GFR, EST AFRICAN AMERICAN: 28 mL/min — AB (ref >60)
GFR, EST NON AFRICAN AMERICAN: 22 mL/min — AB (ref >60)
GFR, EST NON AFRICAN AMERICAN: 24 mL/min — AB (ref >60)
Glucose, Bld: 343 mg/dL — ABNORMAL HIGH (ref 65–99)
Glucose, Bld: 346 mg/dL — ABNORMAL HIGH (ref 65–99)
Potassium: 5.2 mmol/L — ABNORMAL HIGH (ref 3.5–5.1)
Potassium: 4.5 mmol/L (ref 3.5–5.1)
SODIUM: 139 mmol/L (ref 135–145)
Sodium: 137 mmol/L (ref 135–145)

## 2017-09-25 LAB — C DIFFICILE QUICK SCREEN W PCR REFLEX
C DIFFICILE (CDIFF) INTERP: NOT DETECTED
C DIFFICILE (CDIFF) TOXIN: NEGATIVE
C Diff antigen: NEGATIVE

## 2017-09-25 LAB — TROPONIN I: Troponin I: <0.03 ng/mL (ref <0.03)

## 2017-09-25 LAB — CBC WITH DIFFERENTIAL/PLATELET
BASOS ABS: 0.1 10*3/uL (ref 0–0.1)
BASOS PCT: 1 %
EOS ABS: 0.1 10*3/uL (ref 0–0.7)
Eosinophils Relative: 1 %
HCT: 43 % (ref 35.0–47.0)
HEMOGLOBIN: 13.1 g/dL (ref 12.0–16.0)
Lymphocytes Relative: 18 %
Lymphs Abs: 1.6 10*3/uL (ref 1.0–3.6)
MCH: 31.5 pg (ref 26.0–34.0)
MCHC: 30.6 g/dL — AB (ref 32.0–36.0)
MCV: 103 fL — ABNORMAL HIGH (ref 80.0–100.0)
MONOS PCT: 4 %
Monocytes Absolute: 0.4 10*3/uL (ref 0.2–0.9)
NEUTROS PCT: 76 %
Neutro Abs: 7.2 10*3/uL — ABNORMAL HIGH (ref 1.4–6.5)
Platelets: 169 10*3/uL (ref 150–440)
RBC: 4.17 MIL/uL (ref 3.80–5.20)
RDW: 19.8 % — ABNORMAL HIGH (ref 11.5–14.5)
WBC: 9.4 10*3/uL (ref 3.6–11.0)

## 2017-09-25 LAB — INFLUENZA PANEL BY PCR (TYPE A & B)
Influenza A By PCR: NEGATIVE
Influenza B By PCR: NEGATIVE

## 2017-09-25 LAB — GASTROINTESTINAL PANEL BY PCR, STOOL (REPLACES STOOL CULTURE)
Adenovirus F40/41: NOT DETECTED
Astrovirus: NOT DETECTED
CRYPTOSPORIDIUM: NOT DETECTED
Campylobacter species: NOT DETECTED
Cyclospora cayetanensis: NOT DETECTED
ENTEROAGGREGATIVE E COLI (EAEC): NOT DETECTED
Entamoeba histolytica: NOT DETECTED
Enteropathogenic E coli (EPEC): NOT DETECTED
Enterotoxigenic E coli (ETEC): NOT DETECTED
GIARDIA LAMBLIA: NOT DETECTED
Norovirus GI/GII: NOT DETECTED
PLESIMONAS SHIGELLOIDES: NOT DETECTED
ROTAVIRUS A: NOT DETECTED
SALMONELLA SPECIES: NOT DETECTED
SHIGELLA/ENTEROINVASIVE E COLI (EIEC): NOT DETECTED
Sapovirus (I, II, IV, and V): NOT DETECTED
Shiga like toxin producing E coli (STEC): NOT DETECTED
Vibrio cholerae: NOT DETECTED
Vibrio species: NOT DETECTED
YERSINIA ENTEROCOLITICA: NOT DETECTED

## 2017-09-25 LAB — URINALYSIS, COMPLETE (UACMP) WITH MICROSCOPIC
Bilirubin Urine: NEGATIVE
Glucose, UA: 50 mg/dL — AB
Hgb urine dipstick: NEGATIVE
Ketones, ur: 5 mg/dL — AB
Leukocytes, UA: NEGATIVE
Nitrite: NEGATIVE
Protein, ur: 100 mg/dL — AB
SPECIFIC GRAVITY, URINE: 1.019 (ref 1.005–1.030)
pH: 5 (ref 5.0–8.0)

## 2017-09-25 LAB — LACTIC ACID, PLASMA
LACTIC ACID, VENOUS: 2.3 mmol/L — AB (ref 0.5–1.9)
LACTIC ACID, VENOUS: 1.4 mmol/L (ref 0.5–1.9)
Lactic Acid, Venous: 1.9 mmol/L (ref 0.5–1.9)

## 2017-09-25 LAB — HEMOGLOBIN AND HEMATOCRIT, BLOOD
HCT: 40.7 % (ref 35.0–47.0)
HEMOGLOBIN: 12.4 g/dL (ref 12.0–16.0)

## 2017-09-25 LAB — MRSA PCR SCREENING: MRSA by PCR: NEGATIVE

## 2017-09-25 LAB — MAGNESIUM: MAGNESIUM: 1.7 mg/dL (ref 1.7–2.4)

## 2017-09-25 LAB — PHOSPHORUS: PHOSPHORUS: 4.4 mg/dL (ref 2.5–4.6)

## 2017-09-25 LAB — GLUCOSE, CAPILLARY
GLUCOSE-CAPILLARY: 269 mg/dL — AB (ref 65–99)
GLUCOSE-CAPILLARY: 279 mg/dL — AB (ref 65–99)

## 2017-09-25 LAB — PROCALCITONIN

## 2017-09-25 LAB — BRAIN NATRIURETIC PEPTIDE: B NATRIURETIC PEPTIDE 5: 945 pg/mL — AB (ref 0.0–100.0)

## 2017-09-25 MED ORDER — CARBIDOPA-LEVODOPA ER 25-100 MG PO TBCR
2.0000 | EXTENDED_RELEASE_TABLET | Freq: Four times a day (QID) | ORAL | Status: DC
  Filled 2017-09-25: qty 2

## 2017-09-25 MED ORDER — VANCOMYCIN HCL 10 G IV SOLR
1250.0000 mg | Freq: Once | INTRAVENOUS | Status: AC
  Administered 2017-09-25: 1250 mg via INTRAVENOUS
  Filled 2017-09-25: qty 1250

## 2017-09-25 MED ORDER — IPRATROPIUM-ALBUTEROL 0.5-2.5 (3) MG/3ML IN SOLN
3.0000 mL | Freq: Four times a day (QID) | RESPIRATORY_TRACT | Status: DC
  Administered 2017-09-26 (×2): 3 mL via RESPIRATORY_TRACT
  Filled 2017-09-25 (×2): qty 3

## 2017-09-25 MED ORDER — ONDANSETRON HCL 4 MG/2ML IJ SOLN
4.0000 mg | Freq: Four times a day (QID) | INTRAMUSCULAR | Status: DC | PRN
  Administered 2017-09-25: 4 mg via INTRAVENOUS

## 2017-09-25 MED ORDER — ONDANSETRON HCL 4 MG PO TABS: 4.0000 mg | ORAL_TABLET | Freq: Four times a day (QID) | ORAL | Status: DC | PRN

## 2017-09-25 MED ORDER — INSULIN ASPART 100 UNIT/ML ~~LOC~~ SOLN
0.0000 [IU] | Freq: Four times a day (QID) | SUBCUTANEOUS | Status: DC
  Administered 2017-09-26 (×2): 2 [IU] via SUBCUTANEOUS
  Administered 2017-09-26 – 2017-09-27 (×3): 5 [IU] via SUBCUTANEOUS
  Administered 2017-09-27 – 2017-09-28 (×2): 3 [IU] via SUBCUTANEOUS
  Administered 2017-09-28 – 2017-09-29 (×2): 5 [IU] via SUBCUTANEOUS
  Administered 2017-09-29: 2 [IU] via SUBCUTANEOUS
  Administered 2017-09-29 (×2): 3 [IU] via SUBCUTANEOUS
  Administered 2017-09-30 (×3): 2 [IU] via SUBCUTANEOUS
  Administered 2017-10-01: 3 [IU] via SUBCUTANEOUS
  Administered 2017-10-01: 5 [IU] via SUBCUTANEOUS
  Administered 2017-10-01: 2 [IU] via SUBCUTANEOUS
  Administered 2017-10-01: 5 [IU] via SUBCUTANEOUS
  Administered 2017-10-02: 8 [IU] via SUBCUTANEOUS
  Administered 2017-10-02 (×2): 3 [IU] via SUBCUTANEOUS
  Filled 2017-09-25 (×23): qty 1

## 2017-09-25 MED ORDER — ACETAMINOPHEN 325 MG PO TABS: 650.0000 mg | ORAL_TABLET | Freq: Four times a day (QID) | ORAL | Status: DC | PRN

## 2017-09-25 MED ORDER — NITROGLYCERIN 0.4 MG SL SUBL: 0.4000 mg | SUBLINGUAL_TABLET | SUBLINGUAL | Status: DC | PRN

## 2017-09-25 MED ORDER — ACETAMINOPHEN 650 MG RE SUPP: 650.0000 mg | Freq: Four times a day (QID) | RECTAL | Status: DC | PRN

## 2017-09-25 MED ORDER — HEPARIN SODIUM (PORCINE) 5000 UNIT/ML IJ SOLN: 5000.0000 [IU] | Freq: Three times a day (TID) | INTRAMUSCULAR | Status: DC

## 2017-09-25 MED ORDER — NOREPINEPHRINE BITARTRATE 1 MG/ML IV SOLN
0.0000 ug/min | INTRAVENOUS | Status: DC
  Filled 2017-09-25: qty 4

## 2017-09-25 MED ORDER — TRAMADOL HCL 50 MG PO TABS: 50.0000 mg | ORAL_TABLET | Freq: Four times a day (QID) | ORAL | Status: DC | PRN

## 2017-09-25 MED ORDER — SODIUM CHLORIDE 0.9 % IV BOLUS (SEPSIS)
500.0000 mL | Freq: Once | INTRAVENOUS | Status: AC
  Administered 2017-09-25: 500 mL via INTRAVENOUS

## 2017-09-25 MED ORDER — SACCHAROMYCES BOULARDII 250 MG PO CAPS: 250.0000 mg | ORAL_CAPSULE | Freq: Every day | ORAL | Status: DC

## 2017-09-25 MED ORDER — ATORVASTATIN CALCIUM 20 MG PO TABS
20.0000 mg | ORAL_TABLET | Freq: Every day | ORAL | Status: DC
Start: 2017-09-25 — End: 2017-09-25

## 2017-09-25 MED ORDER — ONDANSETRON HCL 4 MG/2ML IJ SOLN
INTRAMUSCULAR | Status: AC
  Administered 2017-09-25: 16:00:00
  Filled 2017-09-25: qty 2

## 2017-09-25 MED ORDER — DEXTROSE 5 % IV SOLN
500.0000 mg | Freq: Once | INTRAVENOUS | Status: AC
  Administered 2017-09-25: 500 mg via INTRAVENOUS
  Filled 2017-09-25: qty 500

## 2017-09-25 MED ORDER — SODIUM CHLORIDE 0.9 % IV BOLUS (SEPSIS): 500.0000 mL | Freq: Once | INTRAVENOUS | Status: DC

## 2017-09-25 MED ORDER — POLYETHYLENE GLYCOL 3350 17 G PO PACK: 17.0000 g | PACK | Freq: Every day | ORAL | Status: DC | PRN

## 2017-09-25 MED ORDER — METOPROLOL TARTRATE 25 MG PO TABS: 25.0000 mg | ORAL_TABLET | Freq: Two times a day (BID) | ORAL | Status: DC

## 2017-09-25 MED ORDER — ESCITALOPRAM OXALATE 10 MG PO TABS: 20.0000 mg | ORAL_TABLET | Freq: Every day | ORAL | Status: DC

## 2017-09-25 MED ORDER — ALBUTEROL SULFATE (2.5 MG/3ML) 0.083% IN NEBU: 2.5000 mg | INHALATION_SOLUTION | RESPIRATORY_TRACT | Status: DC | PRN

## 2017-09-25 MED ORDER — PIPERACILLIN-TAZOBACTAM 3.375 G IVPB 30 MIN
3.3750 g | Freq: Once | INTRAVENOUS | Status: AC
Start: 2017-09-25 — End: 2017-09-25
  Administered 2017-09-25: 3.375 g via INTRAVENOUS
  Filled 2017-09-25: qty 50

## 2017-09-25 MED ORDER — IPRATROPIUM-ALBUTEROL 0.5-2.5 (3) MG/3ML IN SOLN
3.0000 mL | RESPIRATORY_TRACT | Status: AC
  Administered 2017-09-25: 3 mL via RESPIRATORY_TRACT
  Filled 2017-09-25: qty 3

## 2017-09-25 MED ORDER — INSULIN ASPART 100 UNIT/ML ~~LOC~~ SOLN: 0.0000 [IU] | SUBCUTANEOUS | Status: DC

## 2017-09-25 MED ORDER — ONDANSETRON HCL 4 MG/2ML IJ SOLN
4.0000 mg | Freq: Once | INTRAMUSCULAR | Status: DC
  Filled 2017-09-25: qty 2

## 2017-09-25 MED ORDER — SODIUM CHLORIDE 0.9 % IV SOLN: INTRAVENOUS | Status: DC

## 2017-09-25 MED ORDER — DOCUSATE SODIUM 100 MG PO CAPS: 100.0000 mg | ORAL_CAPSULE | Freq: Two times a day (BID) | ORAL | Status: DC

## 2017-09-25 MED ORDER — INSULIN ASPART 100 UNIT/ML ~~LOC~~ SOLN: 0.0000 [IU] | Freq: Every day | SUBCUTANEOUS | Status: DC

## 2017-09-25 MED ORDER — CEFEPIME HCL 1 G IJ SOLR
1.0000 g | Freq: Every day | INTRAMUSCULAR | Status: DC
  Administered 2017-09-26: 1 g via INTRAVENOUS
  Filled 2017-09-25 (×3): qty 1

## 2017-09-25 MED ORDER — PIPERACILLIN-TAZOBACTAM 3.375 G IVPB: 3.3750 g | Freq: Three times a day (TID) | INTRAVENOUS | Status: DC

## 2017-09-25 MED ORDER — METHYLPREDNISOLONE SODIUM SUCC 40 MG IJ SOLR
40.0000 mg | Freq: Four times a day (QID) | INTRAMUSCULAR | Status: DC
  Administered 2017-09-25 – 2017-09-26 (×3): 40 mg via INTRAVENOUS
  Filled 2017-09-25 (×3): qty 1

## 2017-09-25 MED ORDER — PANTOPRAZOLE SODIUM 40 MG IV SOLR
40.0000 mg | INTRAVENOUS | Status: DC
  Administered 2017-09-25 – 2017-09-30 (×6): 40 mg via INTRAVENOUS
  Filled 2017-09-25 (×6): qty 40

## 2017-09-25 MED ORDER — INSULIN ASPART 100 UNIT/ML ~~LOC~~ SOLN
0.0000 [IU] | SUBCUTANEOUS | Status: DC
  Administered 2017-09-25: 8 [IU] via SUBCUTANEOUS
  Filled 2017-09-25: qty 1

## 2017-09-25 MED ORDER — MIRABEGRON ER 25 MG PO TB24: 25.0000 mg | ORAL_TABLET | Freq: Every day | ORAL | Status: DC

## 2017-09-25 MED ORDER — LORAZEPAM 0.5 MG PO TABS: 0.5000 mg | ORAL_TABLET | Freq: Every day | ORAL | Status: DC

## 2017-09-25 MED ORDER — INSULIN ASPART 100 UNIT/ML ~~LOC~~ SOLN
0.0000 [IU] | Freq: Three times a day (TID) | SUBCUTANEOUS | Status: DC
Start: 2017-09-26 — End: 2017-09-25

## 2017-09-25 NOTE — Progress Notes (Signed)
Patient started to throw up while on bipap, mask removed immediately and mouth suctioned with yankauer. Patient then placed on 4l [place], with sats 90-91%. Will contunue to monitor.

## 2017-09-25 NOTE — Progress Notes (Addendum)
Pharmacy Antibiotic Note  Molly Wolf is a 76 y.o. female admitted on 09/25/2017 with respiratory failure secondary to aspiration PNA.  Pharmacy has been consulted for Zosyn dosing.  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  Plan: Zosyn 3.375g IV q8h (4 hour infusion).   12/15 @ 2000: Abx changed to cefepime and vancomycin per CCM:  Cefepime 1 g iv q 24 hours.   Vancomycin 1250 mg iv once. Will f/u MRSA PCR as well as plans for renal replacement and dose vancomycin as indicated if plan is to continue vancomycin.   Height: 5\' 6"  (167.6 cm) Weight: 130 lb (59 kg) IBW/kg (Calculated) : 59.3  No data recorded.  Recent Labs  Lab 09/25/17 1542 09/25/17 1545  WBC  --  9.4  CREATININE  --  2.06*  LATICACIDVEN 2.3*  --     Estimated Creatinine Clearance: 21.3 mL/min (A) (by C-G formula based on SCr of 2.06 mg/dL (H)).    Allergies  Allergen Reactions  . Prednisone Other (See Comments)    halluciantions    Antimicrobials this admission: azithromycin 12/15 x 1 Zosyn 12/15 >>   Dose adjustments this admission:   Microbiology results: GI Panel: sent C diff: sent BCx: sent MRSA PCR: sent UCx: sent  Thank you for allowing pharmacy to be a part of this patient's care.  Valentina GuChristy, Molly Wolf 09/25/2017 7:31 PM

## 2017-09-25 NOTE — ED Triage Notes (Signed)
Pt presents via EMS emergency traffic with respiratory arrest. EMS reports saturation 50-60% upon arrival. Presents bag-mouth with ambu-bag. Pt alert upon arrival. McShane MD at bedside.

## 2017-09-25 NOTE — H&P (Signed)
[employer] [facility] [place] [facility]   PATIENT NAME: Molly Wolf    MR#:  [id number]  DATE OF BIRTH:  March 21, 1941  DATE OF ADMISSION:  09/25/2017  PRIMARY CARE PHYSICIAN: Lauro Regulus, MD   REQUESTING/REFERRING PHYSICIAN: Dr. Ileana Roup  CHIEF COMPLAINT:   Chief Complaint  Patient presents with  . Respiratory Arrest    HISTORY OF PRESENT ILLNESS:  Molly Wolf  is a 76 y.o. female with a known history of CK D stage IV who has recently come off of temporary dialysis that was initiated few months ago, COPD not on home oxygen, coronary artery disease, diabetes mellitus, atrial fibrillation not on anticoagulation due to risk of falls, combined congestive heart failure, hypertension, Parkinson's disease presents to the hospital secondary to acute respiratory distress, possible aspiration. Most of the history is obtained from her daughter at bedside. Patient at baseline is non-ambulated lead lately using a wheelchair. Lives at home with her grandson. She has some trouble swallowing, mostly slow swallowing. Family denies any history of overt aspiration episodes. She has been lately having trouble breathing when was started on inhalers. This afternoon patient had still food in her mouth while she tried to use her Spiriva inhaler and had a possible aspiration episode with hypoxia, cyanosis and difficulty breathing. EMS was called and patient was brought to the emergency room. Initiation of BiPAP was attempted however patient had another vomiting episode here. Initial ABG shows respiratory acidosis with pH of 7.17. Patient denies any nausea now. No fevers or chills. Heart rate is slightly elevated and respiratory rate is going up to the 30s with minimal exertion. Patient is being admitted to stepdown unit.  PAST MEDICAL HISTORY:   Past Medical History:  Diagnosis Date  . Anxiety   . Arthritis   . Chronic combined systolic and diastolic CHF (congestive heart failure)  (HCC)    a. 01/2008 Echo ([facility]): EF > 55%, mod-sev LVH w/ grade 1 DD, biatrial enlargement, mild AS, trace MR/TR; b. EF 45-50%, GR2DD, mild to moderate aortic stenosis, mild aortic insufficieny, mild to moderate mitral regurgitation, mild tricuspid regurgitation, mild biatrial enlargement, and a small pericardial effusion  . CKD (chronic kidney disease), stage IV (HCC)   . Closed patellar sleeve fracture of right knee    a. 01/2017 - conservatively managed.  Marland Kitchen COPD (chronic obstructive pulmonary disease) (HCC)   . Coronary artery disease    a. s/p remote stenting of unknown vessel @ [facility].  . Depression   . Diabetes mellitus with complication (HCC)   . DVT (deep venous thrombosis) (HCC)    a. in the setting of right patellar fracture on Coumadin  . Hyperlipidemia   . Hypertension   . Parkinson disease (HCC)   . Renal insufficiency     PAST SURGICAL HISTORY:   Past Surgical History:  Procedure Laterality Date  . BACK SURGERY    . CORONARY ANGIOPLASTY WITH STENT PLACEMENT    . DIALYSIS/PERMA CATHETER INSERTION N/A 04/22/2017   Procedure: Dialysis/Perma Catheter Insertion;  Surgeon: Annice Needy, MD;  Location: [facility] INVASIVE CV LAB;  Service: Cardiovascular;  Laterality: N/A;  . DIALYSIS/PERMA CATHETER REMOVAL N/A 09/23/2017   Procedure: DIALYSIS/PERMA CATHETER REMOVAL;  Surgeon: Annice Needy, MD;  Location: [facility] INVASIVE CV LAB;  Service: Cardiovascular;  Laterality: N/A;  . ESOPHAGOGASTRODUODENOSCOPY N/A 04/09/2017   Procedure: ESOPHAGOGASTRODUODENOSCOPY (EGD);  Surgeon: Wyline Mood, MD;  Location: [facility] ENDOSCOPY;  Service: Endoscopy;  Laterality: N/A;  . IVC FILTER INSERTION N/A 04/23/2017  Procedure: IVC Filter Insertion;  Surgeon: Renford DillsSchnier, Gregory G, MD;  Location: [facility] INVASIVE CV LAB;  Service: Cardiovascular;  Laterality: N/A;  . NECK SURGERY      SOCIAL HISTORY:   Social History   Tobacco Use  . Smoking status: Never Smoker  . Smokeless tobacco: Never Used  Substance Use  Topics  . Alcohol use: No    FAMILY HISTORY:   Family History  Problem Relation Age of Onset  . Cervical cancer Mother   . Kidney failure Father   . CAD Father   . CAD Brother   . Prostate cancer Neg Hx   . Kidney cancer Neg Hx   . Bladder Cancer Neg Hx     DRUG ALLERGIES:   Allergies  Allergen Reactions  . Prednisone Other (See Comments)    halluciantions    REVIEW OF SYSTEMS:   Review of Systems  Constitutional: Positive for malaise/fatigue and weight loss. Negative for chills and fever.  HENT: Negative for ear discharge, ear pain, hearing loss, nosebleeds and tinnitus.   Eyes: Negative for blurred vision, double vision and photophobia.  Respiratory: Positive for cough and shortness of breath. Negative for hemoptysis and wheezing.   Cardiovascular: Positive for leg swelling. Negative for chest pain, palpitations and orthopnea.  Gastrointestinal: Positive for nausea and vomiting. Negative for abdominal pain, constipation, diarrhea, heartburn and melena.  Genitourinary: Negative for dysuria, frequency and urgency.  Musculoskeletal: Positive for myalgias. Negative for back pain and neck pain.  Skin: Negative for rash.  Neurological: Positive for weakness. Negative for dizziness, tingling, tremors, speech change, focal weakness and headaches.  Endo/Heme/Allergies: Does not bruise/bleed easily.  Psychiatric/Behavioral: Negative for depression.    MEDICATIONS AT HOME:   Prior to Admission medications   Medication Sig Start Date End Date Taking? Authorizing Provider  acetaminophen (TYLENOL) 325 MG tablet Take 650 mg by mouth daily.    Yes [provider]  atorvastatin (LIPITOR) 20 MG tablet Take 1 tablet (20 mg total) by mouth at bedtime. 08/09/17  Yes Hackney, Inetta Fermoina A, FNP  Carbidopa-Levodopa ER (SINEMET CR) 25-100 MG tablet controlled release Take 2 tablets by mouth 4 (four) times daily. Patient taking differently: Take 2 tablets by mouth 3 (three) times daily.   03/05/17  Yes Hackney, Tina A, FNP  Coenzyme Q10 (COQ10) 50 MG CAPS Take 50 mg by mouth daily.   Yes [provider]  Cranberry 450 MG TABS Take 450 mg by mouth daily.   Yes [provider]  docusate sodium (COLACE) 100 MG capsule Take 1 capsule (100 mg total) by mouth 2 (two) times daily. Patient taking differently: Take 100 mg by mouth every other day.  04/27/17  Yes Auburn BilberryPatel, Shreyang, MD  escitalopram (LEXAPRO) 20 MG tablet Take 20 mg by mouth daily.   Yes [provider]  loratadine (CLARITIN) 10 MG tablet Take 5 mg by mouth daily as needed for allergies.    Yes [provider]  LORazepam (ATIVAN) 0.5 MG tablet Take 0.5 mg by mouth at bedtime.    Yes [provider]  mirabegron ER (MYRBETRIQ) 25 MG TB24 tablet Take 25 mg by mouth daily.   Yes [provider]  multivitamin (RENA-VIT) TABS tablet Take 1 tablet by mouth at bedtime. 08/08/17  Yes Altamese DillingVachhani, Vaibhavkumar, MD  nitroGLYCERIN (NITROSTAT) 0.4 MG SL tablet Place 0.4 mg under the tongue every 5 (five) minutes as needed for chest pain.   Yes [provider]  Nutritional Supplements (FEEDING SUPPLEMENT, NEPRO CARB STEADY,) LIQD  Take 237 mLs by mouth 3 (three) times daily between meals. 04/27/17  Yes Auburn Bilberry, MD  Omega-3 Fatty Acids (FISH OIL) 1000 MG CAPS Take 1,000 mg by mouth daily.   Yes [provider]  ondansetron (ZOFRAN ODT) 4 MG disintegrating tablet Take 1 tablet (4 mg total) by mouth every 8 (eight) hours as needed for nausea or vomiting. 07/14/17  Yes Emily Filbert, MD  polyethylene glycol (MIRALAX / GLYCOLAX) packet Take 17 g by mouth daily as needed for mild constipation.   Yes [provider]  promethazine (PHENERGAN) 25 MG tablet Take 25 mg by mouth every 6 (six) hours as needed for nausea or vomiting.   Yes [provider]  saccharomyces boulardii (FLORASTOR) 250 MG capsule Take 250 mg by mouth daily.   Yes [provider]  traMADol (ULTRAM) 50 MG tablet Take 1 tablet (50 mg total) by mouth daily as needed. Patient taking differently: Take 50 mg by mouth daily as needed for moderate pain.  04/27/17  Yes Auburn Bilberry, MD  aspirin EC 81 MG tablet Take 1 tablet (81 mg total) by mouth daily. Patient taking differently: Take 81 mg by mouth every other day.  05/20/17   End, Cristal Deer, MD  gabapentin (NEURONTIN) 300 MG capsule Take 1 capsule (300 mg total) by mouth 2 (two) times daily. Patient not taking: Reported on 09/25/2017 07/15/17   Clarisa Kindred A, FNP      VITAL SIGNS:  Blood pressure (!) 149/89, pulse (!) 125, resp. rate (!) 22, height 5\' 6"  (1.676 m), weight 59 kg (130 lb), SpO2 (!) 87 %.  PHYSICAL EXAMINATION:   Physical Exam  GENERAL:  76 y.o.-year-old ill nourished patient lying in the bed with no acute distress.  EYES: Pupils equal, round, reactive to light and accommodation. No scleral icterus. Extraocular muscles intact.  HEENT: Head atraumatic, normocephalic. Oropharynx and nasopharynx clear.  NECK:  Supple, no jugular venous distention. No thyroid enlargement, no tenderness.  LUNGS: moving air bilaterally, no wheezing, coarse crackles and rhonchi at both bases, using accessory muscles to breathe with minimal exertion.  CARDIOVASCULAR: S1, S2 normal. No rubs, or gallops. 2/6 systolic murmur is present ABDOMEN: Soft, nontender, nondistended. Bowel sounds present. No organomegaly or mass.  EXTREMITIES: No cyanosis, or clubbing. 2+ ankle edema NEUROLOGIC: Cranial nerves II through XII are intact. Muscle strength 5/5 in all extremities. Sensation intact. Gait not checked. Global weakness noted PSYCHIATRIC: The patient is alert and oriented x 3.  SKIN: No obvious rash, lesion, or ulcer.   LABORATORY PANEL:   CBC Recent Labs  Lab 09/25/17 1545  WBC 9.4  HGB 13.1  HCT 43.0  PLT 169    ------------------------------------------------------------------------------------------------------------------  Chemistries  Recent Labs  Lab 09/25/17 1545  NA 137  K 5.2*  CL 110  CO2 22  GLUCOSE 343*  BUN 50*  CREATININE 2.06*  CALCIUM 8.8*  AST 19  ALT <5*  ALKPHOS 63  BILITOT 1.0   ------------------------------------------------------------------------------------------------------------------  Cardiac Enzymes Recent Labs  Lab 09/25/17 1545  TROPONINI <0.03   ------------------------------------------------------------------------------------------------------------------  RADIOLOGY:  Dg Chest Port 1 View  Result Date: 09/25/2017 CLINICAL DATA:  Witnessed aspiration. EXAM: PORTABLE CHEST 1 VIEW COMPARISON:  08/07/2017 FINDINGS: Enlarged cardiac silhouette. Calcific atherosclerotic disease of the aorta and tortuosity. Left lower lobe, left middle lobe airspace consolidation. Moderate left pleural effusion. Osseous structures are without acute abnormality. Soft tissues are grossly normal. IMPRESSION: Left lower and middle lobes airspace consolidation. Moderate left pleural effusion. Electronically Signed  By: Ted Mcalpineobrinka  Dimitrova M.D.   On: 09/25/2017 16:39    EKG:   Orders placed or performed during the hospital encounter of 09/25/17  . ED EKG  . ED EKG  . EKG 12-Lead  . EKG 12-Lead    IMPRESSION AND PLAN:   Molly Wolf  is a 76 y.o. female with a known history of CK D stage IV who has recently come off of temporary dialysis that was initiated few months ago, COPD not on home oxygen, coronary artery disease, diabetes mellitus, atrial fibrillation not on anticoagulation due to risk of falls, combined congestive heart failure, hypertension, Parkinson's disease presents to the hospital secondary to acute respiratory distress, possible aspiration.  1. Acute hypoxic respiratory failure-secondary to possibly aspiration pneumonia -Respiratory acidosis noted on  ABG. Admit to stepdown. If patient can't tolerate BiPAP, will try again. -To avoid aspiration, NG tube requested with continuous suction. -Started on Zosyn. - intensivist consult - duonebs, NPO for now, IV steroids - speech therapy eval  2. CK D stage IV-strict input and output monitoring -Nephrology consulted. Was started on dialysis this year for acute on chronic renal failure and has recently come off of dialysis. -Slight hyperkalemia noted. Monitor carefully  3. Uncontrolled DM- check A1c and start sliding scale insulin. -If sugars are elevated consistently, will need to be on insulin drip while in the ICU -Not on any home medications for diabetes  4. Parkinson's disease-as significant central tremors on exam. Continue home medications.  5. DVT prophylaxis-on subcutaneous heparin  Physical therapy consulted as well    Updated daughter about the possible prognosis. Patient and daughter confirmed Limited code at this time. Did not want intubation, but did CPR and shock or pressors are needed, they're okay with that.  Palliative care consulted to address goals of care   Discussed with Dr. Welton FlakesKhan from ICU for step down admission   All the records are reviewed and case discussed with ED provider. Management plans discussed with the patient, family and they are in agreement.  CODE STATUS: Limited code  TOTAL CRITICAL CARE TIME SPENT IN TAKING CARE OF THIS PATIENT: 65 minutes.    Enid BaasKALISETTI,Niyla Marone M.D on 09/25/2017 at 5:35 PM  Between 7am to 6pm - Pager - [phone]  After 6pm go to [email or web address] - Social research officer, governmentpassword EPAS [facility]  [facility]  Office  [phone]  CC: Primary care physician; Lauro RegulusAnderson, Marshall W, MD

## 2017-09-25 NOTE — Progress Notes (Signed)
Met with the family to discuss goals of care. Briefly, this is a 76 y/o female with an extensive medical history who presented to the ED via EMS for what appears to be an aspiration episode with brief respiratory arrest at home. At the ED, patient was placed on BiPAP, given broad-spectrum antibiotics, and IV fluids.  Her chest x-ray showed a left lower lobe pneumonia and a moderate pleural effusion.  Her ABGs showed a pH of 7.17, PCO2 of 56, PO2 of 52, bicarb of 20 and an SPO2 of 75%.  She was placed on BiPAP but vomited and hence was taken off BiPAP and placed on 6 L nasal cannula.  Patient also developed hypotension in the ED and was given fluid boluses.  She arrived to the ICU hypotensive and hypoxic.  Her repeat ABG in the ICU showed no significant improvement hence the family family was approached regarding further options which included placing patient back on BiPAP or intubating her.  Because she had vomited before it was decided that an NG tube should be placed and her abdomen decompressed prior to placing her back on BiPAP she was also to receive Zofran prior to BiPAP.  However patient was not cooperative with NG tube placement.  She was agitated and stating that she did not want any of the interventions.  Her agitation resulted in some nasal trauma with subsequent bleeding that subsided with nasal packing.  Patient's daughter(Mrs. [name]) and grandson(Forrest Linton Rump) who is the healthcare proxy which contacted.  They agreed to come and meet with Dr. Humphrey Rolls and myself to discuss further goals of care.  Dr. Humphrey Rolls presented variety of options to the family and explained involved interventions as well as the impact of each intervention on the patient and the patient's outcome.  The family indicated that they were already veering towards hospice/comfort care prior to this incident that resulted in patient's hospitalization.  They clearly indicated that they did not want the patient to endure any further  suffering.  They expressed a clear understanding of the patient's comorbidity burden and the likelihood that aggressive interventions will further deteriorate her already poor quality of life.  Of note patient is currently wheelchair confined and having difficulty eating and swallowing.  Dr. Humphrey Rolls explained to the family that they can allow the patient to be a full code which will mean performing CPR and intubating the patient if necessary; make the patient DNR/DNI without escalation of care and with a clear focus of treating the current infection and subsequently sending patient home with hospice care or they can proceed with full comfort measures in which case we will discontinue all active treatments and focus on symptom management and patient's comfort.  The family opted to continue with current interventions but keep patient DNR/DNI with no further escalation of care; no pressors no ABGs and no BiPAP.  The subsequent goal being to transition patient home after discharge with hospice.  They both expressed that patient's desire will be to be at home with her dog. Patient's code status updated. Support provided to the family. Will continue to monitor  Magdalene S. [facility] ANP-BC Pulmonary and Critical Care Medicine [facility] Pager [phone] or [phone]

## 2017-09-25 NOTE — ED Provider Notes (Addendum)
[facility] Emergency Department Provider Note  ____________________________________________   I have reviewed the triage vital signs and the nursing notes. Where available I have reviewed prior notes and, if possible and indicated, outside hospital notes.    HISTORY  Chief Complaint Respiratory Arrest    HPI Molly Wolf is a 76 y.o. female with a history of CHF, chronic kidney disease stage IV last noted, anxiety arthritis, CAD, Parkinson's disease, DVTs in the past, Parkinson's disease presents today complaining of shortness of breath.  Level 5 chart caveat; no further history available due to patient status.  Most history is per EMS, per EMS she was at home, has a history of secreting food in her mouth, came immediately and provocatively unresponsive per family, sats initially were in the 60s, they did start her on assisted breathing with a bag valve mask and patient's sats came up.  Blood pressures were reassuring, she had low amplitude EKG with no obvious ischemic changes, patient is assisting their ventilations on the way into the hospital.  Blood sugar was over 300.  Patient is a full code.  She herself states "I am having trouble breathing" no other complaints.  Very limited history due to acuity, family is not yet here     Past Medical History:  Diagnosis Date  . Anxiety   . Arthritis   . Chronic combined systolic and diastolic CHF (congestive heart failure) (HCC)    a. 01/2008 Echo ([facility]): EF > 55%, mod-sev LVH w/ grade 1 DD, biatrial enlargement, mild AS, trace MR/TR; b. EF 45-50%, GR2DD, mild to moderate aortic stenosis, mild aortic insufficieny, mild to moderate mitral regurgitation, mild tricuspid regurgitation, mild biatrial enlargement, and a small pericardial effusion  . CKD (chronic kidney disease), stage IV (HCC)   . Closed patellar sleeve fracture of right knee    a. 01/2017 - conservatively managed.  Marland Kitchen COPD (chronic obstructive pulmonary  disease) (HCC)   . Coronary artery disease    a. s/p remote stenting of unknown vessel @ [facility].  . Depression   . Diabetes mellitus with complication (HCC)   . DVT (deep venous thrombosis) (HCC)    a. in the setting of right patellar fracture on Coumadin  . Hyperlipidemia   . Hypertension   . Parkinson disease (HCC)   . Renal insufficiency     Patient Active Problem List   Diagnosis Date Noted  . Acute kidney injury (HCC) 08/05/2017  . PNA (pneumonia) 07/02/2017  . DVT (deep venous thrombosis) (HCC) 05/21/2017  . Confusion   . Goals of care, counseling/discussion   . Acute renal failure with acute tubular necrosis superimposed on stage 4 chronic kidney disease (HCC)   . Palliative care encounter   . Coronary artery disease of native artery of native heart with stable angina pectoris (HCC) 03/31/2017  . Persistent atrial fibrillation (HCC) 03/31/2017  . Chronic combined systolic and diastolic heart failure (HCC) 02/26/2017  . HTN (hypertension) 02/26/2017  . Parkinson disease (HCC) 02/26/2017  . Intractable pain 12/27/2016    Past Surgical History:  Procedure Laterality Date  . BACK SURGERY    . CORONARY ANGIOPLASTY WITH STENT PLACEMENT    . DIALYSIS/PERMA CATHETER INSERTION N/A 04/22/2017   Procedure: Dialysis/Perma Catheter Insertion;  Surgeon: Annice Needy, MD;  Location: [facility] INVASIVE CV LAB;  Service: Cardiovascular;  Laterality: N/A;  . DIALYSIS/PERMA CATHETER REMOVAL N/A 09/23/2017   Procedure: DIALYSIS/PERMA CATHETER REMOVAL;  Surgeon: Annice Needy, MD;  Location: [facility] INVASIVE CV LAB;  Service:  Cardiovascular;  Laterality: N/A;  . ESOPHAGOGASTRODUODENOSCOPY N/A 04/09/2017   Procedure: ESOPHAGOGASTRODUODENOSCOPY (EGD);  Surgeon: Wyline Mood, MD;  Location: [facility] ENDOSCOPY;  Service: Endoscopy;  Laterality: N/A;  . IVC FILTER INSERTION N/A 04/23/2017   Procedure: IVC Filter Insertion;  Surgeon: Renford Dills, MD;  Location: [facility] INVASIVE CV LAB;  Service:  Cardiovascular;  Laterality: N/A;  . NECK SURGERY      Prior to Admission medications   Medication Sig Start Date End Date Taking? Authorizing Provider  acetaminophen (TYLENOL) 325 MG tablet Take 650 mg by mouth every 6 (six) hours as needed for mild pain.    [provider]  aspirin EC 81 MG tablet Take 1 tablet (81 mg total) by mouth daily. Patient taking differently: Take 81 mg by mouth every other day.  05/20/17   End, Cristal Deer, MD  atorvastatin (LIPITOR) 20 MG tablet Take 1 tablet (20 mg total) by mouth at bedtime. 08/09/17   Delma Freeze, FNP  Carbidopa-Levodopa ER (SINEMET CR) 25-100 MG tablet controlled release Take 2 tablets by mouth 4 (four) times daily. 03/05/17   Clarisa Kindred A, FNP  Coenzyme Q10 (COQ10) 50 MG CAPS Take 50 mg by mouth daily.    [provider]  Cranberry 450 MG TABS Take 450 mg by mouth daily.    [provider]  docusate sodium (COLACE) 100 MG capsule Take 1 capsule (100 mg total) by mouth 2 (two) times daily. Patient taking differently: Take 100 mg by mouth 2 (two) times daily as needed for moderate constipation.  04/27/17   Auburn Bilberry, MD  doxepin (SINEQUAN) 10 MG capsule Take 10 mg by mouth at bedtime.    [provider]  escitalopram (LEXAPRO) 20 MG tablet Take 20 mg by mouth daily.    [provider]  gabapentin (NEURONTIN) 300 MG capsule Take 1 capsule (300 mg total) by mouth 2 (two) times daily. 07/15/17   Delma Freeze, FNP  loratadine (CLARITIN) 10 MG tablet Take 5 mg by mouth daily as needed for allergies.     [provider]  LORazepam (ATIVAN) 0.5 MG tablet Take 0.5 mg by mouth every 12 (twelve) hours. And as needed for anxiety/pain    [provider]  mirabegron ER (MYRBETRIQ) 25 MG TB24 tablet Take 25 mg by mouth daily.    [provider]  multivitamin (RENA-VIT) TABS tablet Take 1 tablet by mouth at bedtime. 08/08/17   Altamese Dilling, MD  nitroGLYCERIN  (NITROSTAT) 0.4 MG SL tablet Place 0.4 mg under the tongue every 5 (five) minutes as needed for chest pain.    [provider]  Nutritional Supplements (FEEDING SUPPLEMENT, NEPRO CARB STEADY,) LIQD Take 237 mLs by mouth 3 (three) times daily between meals. 04/27/17   Auburn Bilberry, MD  Omega-3 Fatty Acids (FISH OIL) 1000 MG CAPS Take 1,000 mg by mouth daily.    [provider]  ondansetron (ZOFRAN ODT) 4 MG disintegrating tablet Take 1 tablet (4 mg total) by mouth every 8 (eight) hours as needed for nausea or vomiting. 07/14/17   Emily Filbert, MD  polyethylene glycol [facility] / Ethelene Hal) packet Take 17 g by mouth daily as needed for mild constipation.    [provider]  promethazine (PHENERGAN) 25 MG tablet Take 25 mg by mouth every 6 (six) hours as needed for nausea or vomiting.    [provider]  saccharomyces boulardii (FLORASTOR) 250 MG capsule Take 250 mg by mouth daily.    [provider]  traMADol (ULTRAM) 50 MG tablet Take 1 tablet (50 mg total) by mouth daily as needed. Patient taking differently: Take 50 mg by mouth daily as needed for moderate pain.  04/27/17   Auburn BilberryPatel, Shreyang, MD    Allergies Prednisone  Family History  Problem Relation Age of Onset  . Cervical cancer Mother   . Kidney failure Father   . CAD Father   . CAD Brother   . Prostate cancer Neg Hx   . Kidney cancer Neg Hx   . Bladder Cancer Neg Hx     Social History Social History   Tobacco Use  . Smoking status: Never Smoker  . Smokeless tobacco: Never Used  Substance Use Topics  . Alcohol use: No  . Drug use: No    Review of Systems C level 5 chart caveat; no further history available due to patient status.   ____________________________________________   PHYSICAL EXAM:  VITAL SIGNS: ED Triage Vitals  Enc Vitals Group     BP      Pulse      Resp      Temp      Temp src      SpO2      Weight      Height      Head Circumference       Peak Flow      Pain Score      Pain Loc      Pain Edu?      Excl. in GC?     Constitutional: She is alert can tell me her name, unsure of the date, she is quite unwell appearing. Eyes: Conjunctivae are normal Head: Atraumatic HEENT: No congestion/rhinnorhea. Mucous membranes are moist.  Oropharynx non-erythematous Neck:   Nontender with no meningismus, no masses, no stridor Cardiovascular: Normal rate, regular rhythm. Grossly normal heart sounds.  Good peripheral circulation. Respiratory: Slightly increased respiratory rate, no retractions, diminished in the bases poor respiratory effort noted Abdominal: Soft and nontender. No distention. No guarding no rebound Back:  There is no focal tenderness or step off.  there is no midline tenderness there are no lesions noted. there is no CVA tenderness Musculoskeletal: No lower extremity tenderness, no upper extremity tenderness. No joint effusions, no DVT signs strong distal pulses symmetric edema Neurologic:  Normal speech and language. No gross focal neurologic deficits are appreciated.  Wiggles her toes will give grip strength on both sides follows commands Skin:  Skin is warm, dry and intact.  Oozing noted to the left chest wall appears old Psychiatric: Mood and affect are normal. Speech and behavior are normal.  ____________________________________________   LABS (all labs ordered are listed, but only abnormal results are displayed)  Labs Reviewed  URINE CULTURE  CULTURE, BLOOD (ROUTINE X 2)  CULTURE, BLOOD (ROUTINE X 2)  CBC WITH DIFFERENTIAL/PLATELET  TROPONIN I  BRAIN NATRIURETIC PEPTIDE  HEPATIC FUNCTION PANEL  BASIC METABOLIC PANEL  PROTIME-INR  URINALYSIS, COMPLETE (UACMP) WITH MICROSCOPIC  BLOOD GAS, ARTERIAL  LACTIC ACID, PLASMA  LACTIC ACID, PLASMA    Pertinent labs  results that were available during my care of the patient were reviewed by me and considered in my medical decision making (see chart for  details). ____________________________________________  EKG  I personally interpreted any EKGs ordered by me or triage Rate 122, tachycardia, possibly atrial fibrillation most likely, nonspecific ST changes, no acute ST elevation, LAD noted. ____________________________________________  RADIOLOGY  Pertinent labs & imaging results that were available during my care of the  patient were reviewed by me and considered in my medical decision making (see chart for details). If possible, patient and/or family made aware of any abnormal findings.  No results found. ____________________________________________    PROCEDURES  Procedure(s) performed: None  Procedures  Critical Care performed:CRITICAL CARE Performed by: Jeanmarie PlantJAMES A Calleigh Lafontant   Total critical care time: 50 minutes  Critical care time was exclusive of separately billable procedures and treating other patients.  Critical care was necessary to treat or prevent imminent or life-threatening deterioration.  Critical care was time spent personally by me on the following activities: development of treatment plan with patient and/or surrogate as well as nursing, discussions with consultants, evaluation of patient's response to treatment, examination of patient, obtaining history from patient or surrogate, ordering and performing treatments and interventions, ordering and review of laboratory studies, ordering and review of radiographic studies, pulse oximetry and re-evaluation of patient's condition.   ____________________________________________   INITIAL IMPRESSION / ASSESSMENT AND PLAN / ED COURSE  Pertinent labs & imaging results that were available during my care of the patient were reviewed by me and considered in my medical decision making (see chart for details).  Patient comes in after being involved in witnessed respiratory distress with very low sats, unclear etiology initially differential includes aspiration most likely,  CAD, CHF, PE, or more centrally mediated pathology causing a hyper respiratory state.  No obvious indication of CVA at this time.  We will place her on BiPAP she is awake and able to follow commands at this time and see if that helps maintain her sats, sats are in the mid 90s blood pressure is reassuring temperature is pending, EKG reassuring terms of ischemia no evidence of STEMI.  We will continue to monitor the patient.   ----------------------------------------- 4:46 PM on 09/25/2017 -----------------------------------------  We states that she was in her normal state of health, and he believes that she likely had an aspirational event where they were trying to give her her Spiriva.  Patient is doing better, she did try BiPAP here but she vomited we are able to take the BiPAP off without any evidence of aspiration chest x-ray does show pneumonia however unclear how acute it is have given her Zosyn and will add azithromycin in case this is community-acquired pneumonia but it sounds from the family as if she was in her normal state of health and had an acute aspirational event.  Zosyn should cover.  Vital signs are reassuring.  Given chronic kidney and CHF I am going to hold off on sepsis level hydration as I think that will only make things worse but we will continue to monitor her closely here.  Admitted to the hospitalist service.  ----------------------------------------- 6:12 PM on 09/25/2017 -----------------------------------------  An hour and half after admission to the hospital service, patient has a ready bed in the ICU.  Informed that her pressure is trending low.  She is under the care of the hospitalist service, they have come to see the patient.  They are starting fluid boluses.  Patient is mentating at the same baseline.  Given that she has 2 peripheral IVs and fluid hydration has been deferred up until this point given stable pressures and concerns about CHF, she has been  appropriately started on IV hydration, and given that she has a ready bed upstairs we will not defer ICU level care for further emergency room intervention    ____________________________________________   FINAL CLINICAL IMPRESSION(S) / ED DIAGNOSES  Final diagnoses:  SOB (shortness  of breath)      This chart was dictated using voice recognition software.  Despite best efforts to proofread,  errors can occur which can change meaning.      Jeanmarie Plant, MD 09/25/17 1554    Jeanmarie Plant, MD 09/25/17 1610    Jeanmarie Plant, MD 09/25/17 [phone]

## 2017-09-25 NOTE — Progress Notes (Signed)
Attempted to place NG tube in right nare, unsuccessful at this time. NP notified.

## 2017-09-25 NOTE — ED Notes (Signed)
Hospitalist called and stated to give fluid bolus and if pt remains hypotensive to have ED MD place CVL in ED and transfer to ICU. ICU bed ready. McShane MD states to give fluid bolus and transfer to ICU for intensivist to place CVL. Katie charge ICU RN notified.

## 2017-09-25 NOTE — Progress Notes (Addendum)
FAMILY MEETING: Participants:  Molly Wolf (Patient's grandson and HCPOA) and Ms. [name] (patient's daughter) Arlyss Gandy. Tukov ANP, and myself - the ICU team  The ICU team comprising of myself and Ms.Patria Mane met the patient's grandson Education officer, community) as well as the patient's daughter. We discussed the patient's current condition and the plans going forward. I explained to the family that the patient has a pneumonia and severe sepsis.  For now the patient's blood pressure is fine without vasopressors but she is not breathing as well as she should.  If this were to continue, her breathing would get worse and she may stop breathing completely resulting in her death.  In order to treat this we can put her on BiPAP but given that the patient vomited on BiPAP, this would expose her to the risk of vomiting again and potentially aspirating causing worsening of her symptoms.  The other option is to put the patient on mechanical ventilation and life support.  However, I made it clear that we cannot predict how long the patient will need life support for or if she will be able to come off of it. We discussed that in the presence of the patient's baseline chronic issues and poor functionality, the chances of her making a meaningful recovery in case of a cardiac and/or respiratory arrest are poor.  We discussed the following options going forward:  1: Continuing current aggressive care and proceeding with CPR/defibrillation in case of a cardiac arrest as well as intubation and mechanical ventilation in case of respiratory arrest-full code 2: Continue current aggressive care but not to proceed with CPR/defibrillation or intubation or mechanical ventilation in case of a cardiac and/or respiratory arrest-DNR/DNI 3: Continue current aggressive care and not to proceed with CPR/defibrillation in case of a cardiac arrest.  However, if needed, to proceed with intubation and mechanical ventilation-DNR but okay to intubate. 4: To  change goals of care to comfort care only and to proceed with hospice care to allow natural death.  In this option, the patient will not get antibiotics or any treatment but the focus of care will be on comfort and allowing natural death-CC comfort care only.  Both Molly Wolf and [name] unanimously stated that they did not want the patient to be in pain and did not want the patient to go on life support.  Molly Wolf stated that he was concerned CPR could result in the patient experiencing a lot of pain and potentially rib fractures and he did not want the patient to go through that.  He also stated that he did not want the patient to go on life support and ventilator as it will be uncomfortable. He informed us that he had met with palliative care in the past and had been thinking about all these options. He feels that all that the patient wants is to be comfortable and be with her dog.  He does not want the patient to be in pain and wants her to be happy for whatever time she has left. The patient's daughter [name] also agreed with this. We then discussed possibly considering comfort care and proceeding with hospice care.  The family expressed interest in it but did not feel that at this time they are at the point where they would like to stop all the treatment this time and focus on symptom control, comfort measures and end-of-life care.  They expressed that they wanted to give the patient a chance at recovery and wanted to continue the antibiotics IV fluids  and her usual medications but did not want any escalation of care including vasopressors, BiPAP, ABGs, ventilator support, CPR or defibrillation, NG tube, invasive procedures or central lines. They would like to meet with palliative care and explore the possibility of hospice on Monday but he will then want to see if the patient gets better with antibiotics IV fluids and her home medications. Both the patient's daughter and grandson expressed that they want the  patient to not suffer and to be pain-free but still want to give her a chance without being aggressive. I think this is very reasonable. Respecting the family's wishes, we will make her DNR DNI with no escalation of care, no BiPAP, no vasopressors, no ABGs, no NG tube,no invasive procedures or central lines, no intubation / mechanical ventilation and no CPR or defibrillation.  We will continue her antibiotics and her home medications and depending on the patient's condition, consult palliative care and hospice services in the next 24-48 hours  All the family's questions were answered in detail.  Total time spent in discussing the above with the family was 40 minutes.  This was in addition to the critical care time spent taking care of the patient earlier on.  Nettie Elm, MD Pulmonary and Critical Care Medicine

## 2017-09-25 NOTE — Progress Notes (Signed)
Increased to 6l [place], sats increased to 93% on 6l.

## 2017-09-25 NOTE — ED Notes (Signed)
Hospitalist notified of hypotension. Order received to give 500cc bolus.

## 2017-09-25 NOTE — Consult Note (Signed)
[facility] Consultation      Name: Molly Wolf MRN: [id number] DOB: 1940/12/18    ADMISSION DATE:  09/25/2017 CONSULTATION DATE:  09/25/17   HISTORY OF PRESENT ILLNESS    76 years old lady with a past medical history significant for Parkinson's disease, CKD stage IV, coronary artery disease status post stent placement, CHF-ejection fraction 35% predicted, atrial fibrillation-not on anticoagulation secondary to her being a fall risk,?  COPD, hypertension, dyslipidemia, depression, history of DVT, deconditioning-patient is wheelchair-bound who was brought to the emergency room with acute shortness of breath. History is obtained from the patient's daughter as well as from chart review. Per patient's daughter, the patient lives with her grandson Molly Wolf who is her healthcare power of attorney.  The patient lately has been keeping her food in her mouth while eating.  Today she did the same thing had shortness of breath.  She was given Spiriva by her grandson following which with the patient started coughing and had increased work of breathing.  EMS was called, bag mask ventilation was initiated and the patient was brought to the emergency room.  Here the patient was found to be significantly tachypneic and hypoxemic and was placed on BiPAP.  She had an episode of vomiting when she was placed on BiPAP.  She was subsequently taken off BiPAP and was put on 6 L of oxygen via nasal cannula. The patient was given Zosyn and azithromycin in the ER. The patient then became hypotensive with systolic blood pressures in the 80s.  She was given fluid bolus 500 mL's x2 with no improvement in her blood pressure.  She was transferred to the ICU for further care. Reportedly, just prior to transfer, the patient had one bowel movement in the ER.   Upon arrival to the ICU the patient's systolic blood pressure was in the high 80s but her mean arterial pressure was more than 65. She was getting her  third at 500 mL fluid bolus at that time.  Given the patient's history of CHF, CKD stage IV and given the fact that her mean arterial pressure was more than 65, I stopped the 500 mL bolus and started her on IV fluids at 50 mL's per hour to prevent the patient from going into pulmonary edema. The patient was awake, not in distress.  Her O2 saturations were above 90% on 6 L nasal cannula.  Blood pressures are above.  She denies chest pain, chest pressure, abdominal pain though did endorse some nausea.  I did a rectal examination on the patient.  She had yellow stool.  No bleeding.  No melena.  Repeat ABG was performed in the ICU that showed no improvement in her hypercapnic respiratory failure.  I met with the patient's daughter Molly Wolf and updated her on the patient's condition.  Per patient's daughter, the patient's health and functionality has been declining significantly since August this year.  She is wheelchair-bound.  I discussed the options going forward that include:  1: Continuing current aggressive care and proceeding with CPR/defibrillation in case of a cardiac arrest as well as intubation and mechanical ventilation in case of respiratory arrest-full code 2: Continue current aggressive care but not to proceed with CPR/defibrillation or intubation or mechanical ventilation in case of a cardiac and/or respiratory arrest-DNR/DNI 3: To change goals of care to comfort care only and to proceed with hospice care to allow natural death.  In this option, the patient will not get antibiotics or any treatment but the  focus of care will be on comfort and allowing natural death-CC comfort care only.  I had been told by the hospitalist that the patient's family had decided in the ER that she should not be intubated but they were okay with CPR and defibrillation.  However, the patient's daughter felt that given the patient's baseline issues that include Parkinson's, CKD stage IV as well as CHF and her  extremely limited functionality, the patient should be DNR/DNI.   However she informed me that her son, Molly Wolf who lives with the patient is the healthcare power of attorney. I called Molly Wolf on the phone and discussed patient's condition and above-stated options with him on the phone.  He stated that for tonight he wants the patient to be full code and will, "come tomorrow morning and talk to the patient about her CODE STATUS and further plan of care".  I explained to him that in case of a cardiac arrest requiring CPR and defibrillation, the chances of thepatient making any meaningful recovery are very low.  He expressed understanding of the patient's ongoing issues as well as her critical condition but still wanted the patient to be full code.  The patient's daughter and the Seth's mother [name] was a part of that conversation on speaker phone at that time.  She informed me that she wanted to go home and discuss CODE STATUS with all the family and they will let us know their decision.  Till we are told otherwise, respecting the healthcare power of attorney's wishes, the patient will be full code.   Labs in the ER showed CKD with a creatinine of 2.06.  Electrolytes were fine.  BNP was elevated at 945 but in the presence of renal failure, its value is questionable.  Hemoglobin stable at 13.  No leukocytosis. Chest x-ray showed left lower lobe pneumonia with effusion. EKG remained unchanged compared to prior EKG (08/05/2017)  in the system. First set of troponin was negative  PAST MEDICAL HISTORY    :  Past Medical History:  Diagnosis Date  . Anxiety   . Arthritis   . Chronic combined systolic and diastolic CHF (congestive heart failure) ([place])    a. 01/2008 Echo ([facility]): EF > 55%, mod-sev LVH w/ grade 1 DD, biatrial enlargement, mild AS, trace MR/TR; b. EF 45-50%, GR2DD, mild to moderate aortic stenosis, mild aortic insufficieny, mild to moderate mitral regurgitation, mild tricuspid regurgitation,  mild biatrial enlargement, and a small pericardial effusion  . CKD (chronic kidney disease), stage IV ([place])   . Closed patellar sleeve fracture of right knee    a. 01/2017 - conservatively managed.  Marland Kitchen COPD (chronic obstructive pulmonary disease) ([place])   . Coronary artery disease    a. s/p remote stenting of unknown vessel @ [facility].  . Depression   . Diabetes mellitus with complication ([place])   . DVT (deep venous thrombosis) ([facility])    a. in the setting of right patellar fracture on Coumadin  . Hyperlipidemia   . Hypertension   . Parkinson disease ([place])   . Renal insufficiency    Past Surgical History:  Procedure Laterality Date  . BACK SURGERY    . CORONARY ANGIOPLASTY WITH STENT PLACEMENT    . DIALYSIS/PERMA CATHETER INSERTION N/A 04/22/2017   Procedure: Dialysis/Perma Catheter Insertion;  Surgeon: Algernon Huxley, MD;  Location: [facility];  Service: Cardiovascular;  Laterality: N/A;  . DIALYSIS/PERMA CATHETER REMOVAL N/A 09/23/2017   Procedure: DIALYSIS/PERMA CATHETER REMOVAL;  Surgeon: Leotis Pain  S, MD;  Location: [facility];  Service: Cardiovascular;  Laterality: N/A;  . ESOPHAGOGASTRODUODENOSCOPY N/A 04/09/2017   Procedure: ESOPHAGOGASTRODUODENOSCOPY (EGD);  Surgeon: Jonathon Bellows, MD;  Location: [facility] ENDOSCOPY;  Service: Endoscopy;  Laterality: N/A;  . IVC FILTER INSERTION N/A 04/23/2017   Procedure: IVC Filter Insertion;  Surgeon: Katha Cabal, MD;  Location: [facility];  Service: Cardiovascular;  Laterality: N/A;  . NECK SURGERY     Prior to Admission medications   Medication Sig Start Date End Date Taking? Authorizing Provider  acetaminophen (TYLENOL) 325 MG tablet Take 650 mg by mouth daily.    Yes [provider]  atorvastatin (LIPITOR) 20 MG tablet Take 1 tablet (20 mg total) by mouth at bedtime. 08/09/17  Yes Hackney, Otila Kluver A, FNP  Carbidopa-Levodopa ER (SINEMET CR) 25-100 MG tablet controlled release Take 2 tablets by mouth 4 (four) times  daily. Patient taking differently: Take 2 tablets by mouth 3 (three) times daily.  03/05/17  Yes Hackney, Tina A, FNP  Coenzyme Q10 (COQ10) 50 MG CAPS Take 50 mg by mouth daily.   Yes [provider]  Cranberry 450 MG TABS Take 450 mg by mouth daily.   Yes [provider]  docusate sodium (COLACE) 100 MG capsule Take 1 capsule (100 mg total) by mouth 2 (two) times daily. Patient taking differently: Take 100 mg by mouth every other day.  04/27/17  Yes Dustin Flock, MD  escitalopram (LEXAPRO) 20 MG tablet Take 20 mg by mouth daily.   Yes [provider]  loratadine (CLARITIN) 10 MG tablet Take 5 mg by mouth daily as needed for allergies.    Yes [provider]  LORazepam (ATIVAN) 0.5 MG tablet Take 0.5 mg by mouth at bedtime.    Yes [provider]  mirabegron ER (MYRBETRIQ) 25 MG TB24 tablet Take 25 mg by mouth daily.   Yes [provider]  multivitamin (RENA-VIT) TABS tablet Take 1 tablet by mouth at bedtime. 08/08/17  Yes Vaughan Basta, MD  nitroGLYCERIN (NITROSTAT) 0.4 MG SL tablet Place 0.4 mg under the tongue every 5 (five) minutes as needed for chest pain.   Yes [provider]  Nutritional Supplements (FEEDING SUPPLEMENT, NEPRO CARB STEADY,) LIQD Take 237 mLs by mouth 3 (three) times daily between meals. 04/27/17  Yes Dustin Flock, MD  Omega-3 Fatty Acids (FISH OIL) 1000 MG CAPS Take 1,000 mg by mouth daily.   Yes [provider]  ondansetron (ZOFRAN ODT) 4 MG disintegrating tablet Take 1 tablet (4 mg total) by mouth every 8 (eight) hours as needed for nausea or vomiting. 07/14/17  Yes Earleen Newport, MD  polyethylene glycol (MIRALAX / GLYCOLAX) packet Take 17 g by mouth daily as needed for mild constipation.   Yes [provider]  promethazine (PHENERGAN) 25 MG tablet Take 25 mg by mouth every 6 (six) hours as needed for nausea or vomiting.   Yes [provider]  saccharomyces boulardii  (FLORASTOR) 250 MG capsule Take 250 mg by mouth daily.   Yes [provider]  traMADol (ULTRAM) 50 MG tablet Take 1 tablet (50 mg total) by mouth daily as needed. Patient taking differently: Take 50 mg by mouth daily as needed for moderate pain.  04/27/17  Yes Dustin Flock, MD  aspirin EC 81 MG tablet Take 1 tablet (81 mg total) by mouth daily. Patient taking differently: Take 81 mg by mouth every other day.  05/20/17   End, Harrell Gave, MD  gabapentin (NEURONTIN)  300 MG capsule Take 1 capsule (300 mg total) by mouth 2 (two) times daily. Patient not taking: Reported on 09/25/2017 07/15/17   Alisa Graff, FNP   Allergies  Allergen Reactions  . Prednisone Other (See Comments)    halluciantions     FAMILY HISTORY   Family History  Problem Relation Age of Onset  . Cervical cancer Mother   . Kidney failure Father   . CAD Father   . CAD Brother   . Prostate cancer Neg Hx   . Kidney cancer Neg Hx   . Bladder Cancer Neg Hx       SOCIAL HISTORY    reports that  has never smoked. she has never used smokeless tobacco. She reports that she does not drink alcohol or use drugs.  ROS As given in HPI   VITAL SIGNS    Pulse Rate:  [102-125] 109 (12/15 1800) Resp:  [15-25] 17 (12/15 1800) BP: (73-149)/(60-89) 73/60 (12/15 1800) SpO2:  [87 %-98 %] 97 % (12/15 1800) Weight:  [130 lb (59 kg)] 130 lb (59 kg) (12/15 1549) HEMODYNAMICS:   VENTILATOR SETTINGS:   INTAKE / OUTPUT: No intake or output data in the 24 hours ending 09/25/17 1834     PHYSICAL EXAM   Physical Exam  Cachectic lady. Awake, answering questions, has tremor from Parkinson's. No respiratory distress. Sclera anicteric. Chest examination has decreased breath sounds on the left with occasional rhonchi.  Clear to auscultation on the right.  No wheezes. CVS S1, S2, 0. Abdomen soft, nontender, nondistended. DRE performed.  Rectal tone lax.  Yellow stool.  No blood. No lower extremity edema  bilaterally.    LABS   LABS:  CBC Recent Labs  Lab 09/25/17 1545  WBC 9.4  HGB 13.1  HCT 43.0  PLT 169   Coag's Recent Labs  Lab 09/25/17 1545  INR 1.19   BMET Recent Labs  Lab 09/25/17 1545  NA 137  K 5.2*  CL 110  CO2 22  BUN 50*  CREATININE 2.06*  GLUCOSE 343*   Electrolytes Recent Labs  Lab 09/25/17 1545  CALCIUM 8.8*   Sepsis Markers Recent Labs  Lab 09/25/17 1542  LATICACIDVEN 2.3*   ABG Recent Labs  Lab 09/25/17 1548  PHART 7.17*  PCO2ART 56*  PO2ART 52*   Liver Enzymes Recent Labs  Lab 09/25/17 1545  AST 19  ALT <5*  ALKPHOS 63  BILITOT 1.0  ALBUMIN 3.3*   Cardiac Enzymes Recent Labs  Lab 09/25/17 1545  TROPONINI <0.03   Glucose Recent Labs  Lab 09/25/17 1545  GLUCAP 269*     No results found for this or any previous visit (from the past 240 hour(s)).   Current Facility-Administered Medications:  .  insulin aspart (novoLOG) injection 0-5 Units, 0-5 Units, Subcutaneous, QHS, Gladstone Lighter, MD .  Derrill Memo ON 09/26/2017] insulin aspart (novoLOG) injection 0-9 Units, 0-9 Units, Subcutaneous, TID WC, Kalisetti, Radhika, MD .  ondansetron (ZOFRAN) injection 4 mg, 4 mg, Intravenous, Once, McShane, James A, MD .  sodium chloride 0.9 % bolus 500 mL, 500 mL, Intravenous, Once, Gladstone Lighter, MD  Current Outpatient Medications:  .  acetaminophen (TYLENOL) 325 MG tablet, Take 650 mg by mouth daily. , Disp: , Rfl:  .  atorvastatin (LIPITOR) 20 MG tablet, Take 1 tablet (20 mg total) by mouth at bedtime., Disp: 90 tablet, Rfl: 3 .  Carbidopa-Levodopa ER (SINEMET CR) 25-100 MG tablet controlled release, Take 2 tablets by mouth 4 (four) times daily. (Patient  taking differently: Take 2 tablets by mouth 3 (three) times daily. ), Disp: 240 tablet, Rfl: 1 .  Coenzyme Q10 (COQ10) 50 MG CAPS, Take 50 mg by mouth daily., Disp: , Rfl:  .  Cranberry 450 MG TABS, Take 450 mg by mouth daily., Disp: , Rfl:  .  docusate sodium (COLACE)  100 MG capsule, Take 1 capsule (100 mg total) by mouth 2 (two) times daily. (Patient taking differently: Take 100 mg by mouth every other day. ), Disp: 10 capsule, Rfl: 0 .  escitalopram (LEXAPRO) 20 MG tablet, Take 20 mg by mouth daily., Disp: , Rfl:  .  loratadine (CLARITIN) 10 MG tablet, Take 5 mg by mouth daily as needed for allergies. , Disp: , Rfl:  .  LORazepam (ATIVAN) 0.5 MG tablet, Take 0.5 mg by mouth at bedtime. , Disp: , Rfl:  .  mirabegron ER (MYRBETRIQ) 25 MG TB24 tablet, Take 25 mg by mouth daily., Disp: , Rfl:  .  multivitamin (RENA-VIT) TABS tablet, Take 1 tablet by mouth at bedtime., Disp: 30 tablet, Rfl: 0 .  nitroGLYCERIN (NITROSTAT) 0.4 MG SL tablet, Place 0.4 mg under the tongue every 5 (five) minutes as needed for chest pain., Disp: , Rfl:  .  Nutritional Supplements (FEEDING SUPPLEMENT, NEPRO CARB STEADY,) LIQD, Take 237 mLs by mouth 3 (three) times daily between meals., Disp: , Rfl: 0 .  Omega-3 Fatty Acids (FISH OIL) 1000 MG CAPS, Take 1,000 mg by mouth daily., Disp: , Rfl:  .  ondansetron (ZOFRAN ODT) 4 MG disintegrating tablet, Take 1 tablet (4 mg total) by mouth every 8 (eight) hours as needed for nausea or vomiting., Disp: 20 tablet, Rfl: 0 .  polyethylene glycol (MIRALAX / GLYCOLAX) packet, Take 17 g by mouth daily as needed for mild constipation., Disp: , Rfl:  .  promethazine (PHENERGAN) 25 MG tablet, Take 25 mg by mouth every 6 (six) hours as needed for nausea or vomiting., Disp: , Rfl:  .  saccharomyces boulardii (FLORASTOR) 250 MG capsule, Take 250 mg by mouth daily., Disp: , Rfl:  .  traMADol (ULTRAM) 50 MG tablet, Take 1 tablet (50 mg total) by mouth daily as needed. (Patient taking differently: Take 50 mg by mouth daily as needed for moderate pain. ), Disp: 5 tablet, Rfl: 0 .  aspirin EC 81 MG tablet, Take 1 tablet (81 mg total) by mouth daily. (Patient taking differently: Take 81 mg by mouth every other day. ), Disp: 90 tablet, Rfl: 3 .  gabapentin (NEURONTIN)  300 MG capsule, Take 1 capsule (300 mg total) by mouth 2 (two) times daily. (Patient not taking: Reported on 09/25/2017), Disp: 180 capsule, Rfl: 3  IMAGING    Dg Chest Port 1 View  Result Date: 09/25/2017 CLINICAL DATA:  Witnessed aspiration. EXAM: PORTABLE CHEST 1 VIEW COMPARISON:  08/07/2017 FINDINGS: Enlarged cardiac silhouette. Calcific atherosclerotic disease of the aorta and tortuosity. Left lower lobe, left middle lobe airspace consolidation. Moderate left pleural effusion. Osseous structures are without acute abnormality. Soft tissues are grossly normal. IMPRESSION: Left lower and middle lobes airspace consolidation. Moderate left pleural effusion. Electronically Signed   By: Fidela Salisbury M.D.   On: 09/25/2017 16:39       MICRO DATA: MRSA PCR  Urine  Blood Resp     ASSESSMENT/PLAN   76 y/o lady with PMH of Parkinsons, CKD IV (recently taken off IHD), ? COPD, CAD s/p stent placement, DM, Afib (not on anticoagulation given risk of falls), CHF - EF 35%, HTN,  dyslipidemia, depression, history of DVT who presented to the ED with hypoxemic and hypercapnic respiratory failure after aspiration.  Transferred to ICU for:  Acute hypercapnic respiratory failure Acute hypoxemic respiratory failure Pneumonia - CAP with superimposed aspiration Severe sepsis with shock Lactic acidosis CKD - IV Parkinsons CKD IV (recently taken off IHD) ? COPD CAD s/p stent placement DM Afib (not on anticoagulation given risk of falls) CHF - EF 35% HTN Dyslipidemia Depression History of DVT   For now the patient's mean arterial pressure is more than 65 without pressors. Given her history of CHF with EF of 35% as well as CKD stage IV, we will be cautious with IV fluids.  We will start her on normal saline at 50 mils per hour and if the patient becomes hypotensive with mean arterial pressure less than 65, will start her on Levophed.   If she needs to be started on vasopressors, she will  need a central line placed.  Start levophed.  Trend lactic acid Given her renal failure, will change Zosyn to cefepime.  Add vancomycin. Blood cultures were sent in the ED.  Follow blood cultures  Patient is maintaining sats on 6 L [place].  Continues to have hypercapnic respiratory failure.   The patient vomited when she was started on BiPAP in the ED.  . We attempted placing an NG tube to decompress the stomach but the patient got agitated. Given this, the plan is to proceed with intubation and mechanical ventilation as the patient is a full code.  This was communicated to the patient's grandson who is a healthcare power of attorney.  He is coming to the hospital now. Methylprednisone 40 mg Q6h. Duo nebs Q6h and albuterol Q2h RPN Influenza screen  Trend troponins. Echo in am  Monitor U/O. Follow creatinine. Inform nephrology of her admission in AM. High risk of ATN induced acute on chronic renal failure.  NPO for now.  Will start home medications after the patient is intubated.  Reportedly the patient had a maroon stool in the ER.  The patient did not have any evidence of GI bleed on my rectal examination.  We have sent a stool for Hemoccult. We will type and crossmatch the patient just in case. We will repeat a hemoglobin in 4 hours. Start Protonix 40 mg daily.  DVT prophylaxis with SCDs given the questionable GI bleed GI prophylaxis Protonix Sliding scale for glycemic control.   Full code.  I have personally obtained a history, examined the patient, evaluated laboratory and independently reviewed  imaging results, formulated the assessment and plan and placed orders.  The Patient requires high complexity decision making for assessment and support, frequent evaluation and titration of therapies, application of advanced monitoring technologies and extensive interpretation of multiple databases. Critical Care Time devoted to patient care services described in this note is 90 minutes.       Nettie Elm, M.D.  Pulmonary and Critical Care Medicine

## 2017-09-26 ENCOUNTER — Inpatient Hospital Stay (HOSPITAL_COMMUNITY)
Admit: 2017-09-26 | Discharge: 2017-09-26 | Disposition: A | Discharge status: Home / Self Care | Payer: Medicare Other | Attending: Pulmonary Disease | Admitting: Pulmonary Disease

## 2017-09-26 DIAGNOSIS — I340 Nonrheumatic mitral (valve) insufficiency: Secondary | ICD-10-CM

## 2017-09-26 DIAGNOSIS — J9601 Acute respiratory failure with hypoxia: Secondary | ICD-10-CM

## 2017-09-26 DIAGNOSIS — I361 Nonrheumatic tricuspid (valve) insufficiency: Secondary | ICD-10-CM

## 2017-09-26 LAB — BASIC METABOLIC PANEL
ANION GAP: 7 (ref 5–15)
BUN: 47 mg/dL — AB (ref 6–20)
CHLORIDE: 113 mmol/L — AB (ref 101–111)
CO2: 20 mmol/L — ABNORMAL LOW (ref 22–32)
Calcium: 8.1 mg/dL — ABNORMAL LOW (ref 8.9–10.3)
Creatinine, Ser: 1.96 mg/dL — ABNORMAL HIGH (ref 0.44–1.00)
GFR calc Af Amer: 27 mL/min — ABNORMAL LOW (ref >60)
GFR, EST NON AFRICAN AMERICAN: 23 mL/min — AB (ref >60)
Glucose, Bld: 207 mg/dL — ABNORMAL HIGH (ref 65–99)
POTASSIUM: 4.8 mmol/L (ref 3.5–5.1)
SODIUM: 140 mmol/L (ref 135–145)

## 2017-09-26 LAB — CBC
HEMATOCRIT: 39.6 % (ref 35.0–47.0)
HEMOGLOBIN: 12.2 g/dL (ref 12.0–16.0)
MCH: 31.8 pg (ref 26.0–34.0)
MCHC: 30.9 g/dL — ABNORMAL LOW (ref 32.0–36.0)
MCV: 103 fL — AB (ref 80.0–100.0)
Platelets: 106 10*3/uL — ABNORMAL LOW (ref 150–440)
RBC: 3.85 MIL/uL (ref 3.80–5.20)
RDW: 19.6 % — AB (ref 11.5–14.5)
WBC: 7.2 10*3/uL (ref 3.6–11.0)

## 2017-09-26 LAB — GLUCOSE, CAPILLARY
GLUCOSE-CAPILLARY: 137 mg/dL — AB (ref 65–99)
GLUCOSE-CAPILLARY: 206 mg/dL — AB (ref 65–99)
GLUCOSE-CAPILLARY: 212 mg/dL — AB (ref 65–99)
Glucose-Capillary: 148 mg/dL — ABNORMAL HIGH (ref 65–99)
Glucose-Capillary: 246 mg/dL — ABNORMAL HIGH (ref 65–99)

## 2017-09-26 LAB — TROPONIN I

## 2017-09-26 LAB — ECHOCARDIOGRAM COMPLETE
Height: 66 in
Weight: 2080 oz

## 2017-09-26 LAB — PROCALCITONIN

## 2017-09-26 MED ORDER — IPRATROPIUM-ALBUTEROL 0.5-2.5 (3) MG/3ML IN SOLN: 3.0000 mL | RESPIRATORY_TRACT | Status: DC | PRN

## 2017-09-26 MED ORDER — SODIUM CHLORIDE 0.9 % IV BOLUS (SEPSIS)
1000.0000 mL | Freq: Once | INTRAVENOUS | Status: AC
  Administered 2017-09-26: 1000 mL via INTRAVENOUS

## 2017-09-26 MED ORDER — DEXTROSE-NACL 5-0.45 % IV SOLN
INTRAVENOUS | Status: DC
  Administered 2017-09-26: 10:00:00 via INTRAVENOUS

## 2017-09-26 MED ORDER — LORAZEPAM 0.5 MG PO TABS
0.5000 mg | ORAL_TABLET | Freq: Every day | ORAL | Status: DC
  Administered 2017-09-26 – 2017-09-30 (×5): 0.5 mg via ORAL
  Filled 2017-09-26 (×5): qty 1

## 2017-09-26 MED ORDER — MORPHINE SULFATE (PF) 2 MG/ML IV SOLN
2.0000 mg | INTRAVENOUS | Status: DC | PRN
  Administered 2017-09-26: 2 mg via INTRAVENOUS
  Filled 2017-09-26: qty 1

## 2017-09-26 MED ORDER — SULFACETAMIDE SODIUM 10 % OP SOLN
1.0000 [drp] | Freq: Three times a day (TID) | OPHTHALMIC | Status: DC
  Filled 2017-09-26: qty 15

## 2017-09-26 MED ORDER — SODIUM CHLORIDE 0.9 % IV BOLUS (SEPSIS): 1000.0000 mL | Freq: Once | INTRAVENOUS | Status: DC

## 2017-09-26 MED ORDER — SULFACETAMIDE SODIUM 10 % OP SOLN
1.0000 [drp] | OPHTHALMIC | Status: DC
  Administered 2017-09-26 – 2017-10-04 (×39): 1 [drp] via OPHTHALMIC
  Filled 2017-09-26 (×2): qty 15

## 2017-09-26 NOTE — Progress Notes (Signed)
[facility] Consultation      Name: Molly Wolf MRN: [id number] DOB: 12/04/1940    ADMISSION DATE:  09/25/2017 CONSULTATION DATE:  09/25/17   HISTORY OF PRESENT ILLNESS    76 years old lady with a past medical history significant for Parkinson's disease, CKD stage IV, coronary artery disease status post stent placement, CHF-ejection fraction 35% predicted, atrial fibrillation-not on anticoagulation secondary to her being a fall risk,?  COPD, hypertension, dyslipidemia, depression, history of DVT, deconditioning-patient is wheelchair-bound who was brought to the emergency room with acute shortness of breath. History is obtained from the patient's daughter as well as from chart review. Per patient's daughter, the patient lives with her grandson Molly Wolf who is her healthcare power of attorney.  The patient lately has been keeping her food in her mouth while eating.  Today she did the same thing had shortness of breath.  She was given Spiriva by her grandson following which with the patient started coughing and had increased work of breathing.  EMS was called, bag mask ventilation was initiated and the patient was brought to the emergency room.  Here the patient was found to be significantly tachypneic and hypoxemic and was placed on BiPAP.  She had an episode of vomiting when she was placed on BiPAP.  She was subsequently taken off BiPAP and was put on 6 L of oxygen via nasal cannula. The patient was given Zosyn and azithromycin in the ER. The patient then became hypotensive with systolic blood pressures in the 80s.  She was given fluid bolus 500 mL's x2 with no improvement in her blood pressure.  She was transferred to the ICU for further care. Reportedly, just prior to transfer, the patient had one bowel movement in the ER.   Upon arrival to the ICU the patient's systolic blood pressure was in the high 80s but her mean arterial pressure was more than 65. She was getting her  third at 500 mL fluid bolus at that time.  Given the patient's history of CHF, CKD stage IV and given the fact that her mean arterial pressure was more than 65, I stopped the 500 mL bolus and started her on IV fluids at 50 mL's per hour to prevent the patient from going into pulmonary edema. The patient was awake, not in distress.  Her O2 saturations were above 90% on 6 L nasal cannula.  Blood pressures are above.  She denies chest pain, chest pressure, abdominal pain though did endorse some nausea.  I did a rectal examination on the patient.  She had yellow stool.  No bleeding.  No melena.  Repeat ABG was performed in the ICU that showed no improvement in her hypercapnic respiratory failure.  I met with the patient's daughter Molly Wolf and updated her on the patient's condition.  Per patient's daughter, the patient's health and functionality has been declining significantly since August this year.  She is wheelchair-bound.  I discussed the options going forward that include:  1: Continuing current aggressive care and proceeding with CPR/defibrillation in case of a cardiac arrest as well as intubation and mechanical ventilation in case of respiratory arrest-full code 2: Continue current aggressive care but not to proceed with CPR/defibrillation or intubation or mechanical ventilation in case of a cardiac and/or respiratory arrest-DNR/DNI 3: To change goals of care to comfort care only and to proceed with hospice care to allow natural death.  In this option, the patient will not get antibiotics or any treatment but the  focus of care will be on comfort and allowing natural death-CC comfort care only.  I had been told by the hospitalist that the patient's family had decided in the ER that she should not be intubated but they were okay with CPR and defibrillation.  However, the patient's daughter felt that given the patient's baseline issues that include Parkinson's, CKD stage IV as well as CHF and her  extremely limited functionality, the patient should be DNR/DNI.   However she informed me that her son, Molly Wolf who lives with the patient is the healthcare power of attorney. I called Molly Wolf on the phone and discussed patient's condition and above-stated options with him on the phone.  He stated that for tonight he wants the patient to be full code and will, "come tomorrow morning and talk to the patient about her CODE STATUS and further plan of care".  I explained to him that in case of a cardiac arrest requiring CPR and defibrillation, the chances of thepatient making any meaningful recovery are very low.  He expressed understanding of the patient's ongoing issues as well as her critical condition but still wanted the patient to be full code.  The patient's daughter and the Seth's mother [name] was a part of that conversation on speaker phone at that time.  She informed me that she wanted to go home and discuss CODE STATUS with all the family and they will let us know their decision.  Till we are told otherwise, respecting the healthcare power of attorney's wishes, the patient will be full code. Labs in the ER showed CKD with a creatinine of 2.06.  Electrolytes were fine.  BNP was elevated at 945 but in the presence of renal failure, its value is questionable.  Hemoglobin stable at 13.  No leukocytosis. Chest x-ray showed left lower lobe pneumonia with effusion. EKG remained unchanged compared to prior EKG (08/05/2017)  in the system. First set of troponin was negative  SUBJECTIVE: Sleep most of the night. No acute issues.   VITAL SIGNS    Temp:  [97.3 F (36.3 C)] 97.3 F (36.3 C) (12/15 2000) Pulse Rate:  [102-125] 108 (12/16 0600) Resp:  [11-25] 11 (12/16 0600) BP: (73-149)/(60-91) 111/75 (12/16 0600) SpO2:  [87 %-100 %] 99 % (12/16 0600) FiO2 (%):  [45 %] 45 % (12/16 0234) Weight:  [59 kg (130 lb)] 59 kg (130 lb) (12/15 1549) HEMODYNAMICS:   VENTILATOR SETTINGS: FiO2 (%):  [45 %]  45 % INTAKE / OUTPUT:  Intake/Output Summary (Last 24 hours) at 09/26/2017 0712 Last data filed at 09/26/2017 0636 Gross per 24 hour  Intake 1608.33 ml  Output 80 ml  Net 1528.33 ml     PHYSICAL EXAM  Cachectic lady. Awake, answering questions, has tremor from Parkinson's. No respiratory distress. Sclera anicteric. Chest examination has decreased breath sounds on the left with occasional rhonchi.  Clear to auscultation on the right.  No wheezes. CVS S1, S2, 0. Abdomen soft, nontender, nondistended. DRE performed.  Rectal tone lax.  Yellow stool.  No blood. No lower extremity edema bilaterally.  LABS   LABS:  CBC Recent Labs  Lab 09/25/17 1545 09/25/17 2133 09/26/17 0212  WBC 9.4  --  7.2  HGB 13.1 12.4 12.2  HCT 43.0 40.7 39.6  PLT 169  --  106*   Coag's Recent Labs  Lab 09/25/17 1545  INR 1.19   BMET Recent Labs  Lab 09/25/17 1545 09/25/17 2133 09/26/17 0212  NA 137 139 140  K 5.2* 4.5 4.8  CL 110 111 113*  CO2 22 21* 20*  BUN 50* 50* 47*  CREATININE 2.06* 1.94* 1.96*  GLUCOSE 343* 346* 207*   Electrolytes Recent Labs  Lab 09/25/17 1545 09/25/17 2133 09/26/17 0212  CALCIUM 8.8* 8.0* 8.1*  MG  --  1.7  --   PHOS  --  4.4  --    Sepsis Markers Recent Labs  Lab 09/25/17 1542 09/25/17 1926 09/25/17 2133 09/26/17 0212  LATICACIDVEN 2.3* 1.9 1.4  --   PROCALCITON  --   --  <0.10 <0.10   ABG Recent Labs  Lab 09/25/17 1548  PHART 7.17*  PCO2ART 56*  PO2ART 52*   Liver Enzymes Recent Labs  Lab 09/25/17 1545  AST 19  ALT <5*  ALKPHOS 63  BILITOT 1.0  ALBUMIN 3.3*   Cardiac Enzymes Recent Labs  Lab 09/25/17 1545 09/25/17 2133 09/26/17 0212  TROPONINI <0.03 <0.03 <0.03   Glucose Recent Labs  Lab 09/25/17 1545 09/25/17 2151 09/26/17 0056 09/26/17 0630  GLUCAP 269* 279* 212* 137*     Recent Results (from the past 240 hour(s))  Culture, blood (routine x 2)     Status: None (Preliminary result)   Collection Time:  09/25/17  3:45 PM  Result Value Ref Range Status   Specimen Description BLOOD RIGHT ANTECUBITAL  Final   Special Requests   Final    BOTTLES DRAWN AEROBIC AND ANAEROBIC Blood Culture adequate volume   Culture NO GROWTH < 24 HOURS  Final   Report Status PENDING  Incomplete  Culture, blood (routine x 2)     Status: None (Preliminary result)   Collection Time: 09/25/17  4:03 PM  Result Value Ref Range Status   Specimen Description BLOOD BLOOD LEFT FOREARM  Final   Special Requests   Final    BOTTLES DRAWN AEROBIC AND ANAEROBIC Blood Culture results may not be optimal due to an inadequate volume of blood received in culture bottles   Culture NO GROWTH < 24 HOURS  Final   Report Status PENDING  Incomplete  CULTURE, BLOOD (ROUTINE X 2) w Reflex to ID Panel     Status: None (Preliminary result)   Collection Time: 09/25/17  7:26 PM  Result Value Ref Range Status   Specimen Description BLOOD RIGHT HAND  Final   Special Requests   Final    BOTTLES DRAWN AEROBIC AND ANAEROBIC Blood Culture adequate volume   Culture NO GROWTH < 12 HOURS  Final   Report Status PENDING  Incomplete  CULTURE, BLOOD (ROUTINE X 2) w Reflex to ID Panel     Status: None (Preliminary result)   Collection Time: 09/25/17  7:39 PM  Result Value Ref Range Status   Specimen Description BLOOD LEFT ANTECUBITAL  Final   Special Requests   Final    BOTTLES DRAWN AEROBIC AND ANAEROBIC Blood Culture adequate volume   Culture NO GROWTH < 12 HOURS  Final   Report Status PENDING  Incomplete  MRSA PCR Screening     Status: None   Collection Time: 09/25/17  7:41 PM  Result Value Ref Range Status   MRSA by PCR NEGATIVE NEGATIVE Final    Comment:        The GeneXpert MRSA Assay (FDA approved for NASAL specimens only), is one component of a comprehensive MRSA colonization surveillance program. It is not intended to diagnose MRSA infection nor to guide or monitor treatment for MRSA infections.   Gastrointestinal Panel by PCR ,  Stool  Status: None   Collection Time: 09/25/17  7:41 PM  Result Value Ref Range Status   Campylobacter species NOT DETECTED NOT DETECTED Final   Plesimonas shigelloides NOT DETECTED NOT DETECTED Final   Salmonella species NOT DETECTED NOT DETECTED Final   Yersinia enterocolitica NOT DETECTED NOT DETECTED Final   Vibrio species NOT DETECTED NOT DETECTED Final   Vibrio cholerae NOT DETECTED NOT DETECTED Final   Enteroaggregative E coli (EAEC) NOT DETECTED NOT DETECTED Final   Enteropathogenic E coli (EPEC) NOT DETECTED NOT DETECTED Final   Enterotoxigenic E coli (ETEC) NOT DETECTED NOT DETECTED Final   Shiga like toxin producing E coli (STEC) NOT DETECTED NOT DETECTED Final   Shigella/Enteroinvasive E coli (EIEC) NOT DETECTED NOT DETECTED Final   Cryptosporidium NOT DETECTED NOT DETECTED Final   Cyclospora cayetanensis NOT DETECTED NOT DETECTED Final   Entamoeba histolytica NOT DETECTED NOT DETECTED Final   Giardia lamblia NOT DETECTED NOT DETECTED Final   Adenovirus F40/41 NOT DETECTED NOT DETECTED Final   Astrovirus NOT DETECTED NOT DETECTED Final   Norovirus GI/GII NOT DETECTED NOT DETECTED Final   Rotavirus A NOT DETECTED NOT DETECTED Final   Sapovirus (I, II, IV, and V) NOT DETECTED NOT DETECTED Final  C difficile quick scan w PCR reflex     Status: None   Collection Time: 09/25/17  7:41 PM  Result Value Ref Range Status   C Diff antigen NEGATIVE NEGATIVE Final   C Diff toxin NEGATIVE NEGATIVE Final   C Diff interpretation No C. difficile detected.  Final     Current Facility-Administered Medications:  .  0.9 %  sodium chloride infusion, , Intravenous, Continuous, Tukov, Magadalene S, NP, Last Rate: 50 mL/hr at 09/26/17 0600 .  0.9 %  sodium chloride infusion, , Intravenous, Continuous, Nettie Elm, MD .  albuterol (PROVENTIL) (2.5 MG/3ML) 0.083% nebulizer solution 2.5 mg, 2.5 mg, Nebulization, Q2H PRN, Nettie Elm, MD .  ceFEPIme (MAXIPIME) 1 g in dextrose 5 % 50 mL  IVPB, 1 g, Intravenous, q1800, Nettie Elm, MD .  insulin aspart (novoLOG) injection 0-15 Units, 0-15 Units, Subcutaneous, Q6H, Tukov, Magadalene S, NP, 2 Units at 09/26/17 [phone] .  ipratropium-albuterol (DUONEB) 0.5-2.5 (3) MG/3ML nebulizer solution 3 mL, 3 mL, Nebulization, Q6H, Kalisetti, Radhika, MD, 3 mL at 09/26/17 0231 .  methylPREDNISolone sodium succinate (SOLU-MEDROL) 40 mg/mL injection 40 mg, 40 mg, Intravenous, Q6H, Gladstone Lighter, MD, 40 mg at 09/26/17 3557 .  morphine 2 MG/ML injection 2 mg, 2 mg, Intravenous, Q3H PRN, Patria Mane, Magadalene S, NP, 2 mg at 09/26/17 0249 .  nitroGLYCERIN (NITROSTAT) SL tablet 0.4 mg, 0.4 mg, Sublingual, Q5 min PRN, Gladstone Lighter, MD .  norepinephrine (LEVOPHED) 4 mg in dextrose 5 % 250 mL (0.016 mg/mL) infusion, 0-40 mcg/min, Intravenous, Titrated, Nettie Elm, MD .  ondansetron [facility]) injection 4 mg, 4 mg, Intravenous, Once, McShane, Gerda Diss, MD .  ondansetron (ZOFRAN) tablet 4 mg, 4 mg, Oral, Q6H PRN **OR** ondansetron (ZOFRAN) injection 4 mg, 4 mg, Intravenous, Q6H PRN, Gladstone Lighter, MD, 4 mg at 09/25/17 1945 .  pantoprazole (PROTONIX) injection 40 mg, 40 mg, Intravenous, Q24H, Nettie Elm, MD, 40 mg at 09/25/17 2038 .  polyethylene glycol (MIRALAX / GLYCOLAX) packet 17 g, 17 g, Oral, Daily PRN, Gladstone Lighter, MD  IMAGING    Dg Chest Port 1 View  Result Date: 09/25/2017 CLINICAL DATA:  Witnessed aspiration. EXAM: PORTABLE CHEST 1 VIEW COMPARISON:  08/07/2017 FINDINGS: Enlarged cardiac silhouette. Calcific atherosclerotic disease of the aorta and tortuosity.  Left lower lobe, left middle lobe airspace consolidation. Moderate left pleural effusion. Osseous structures are without acute abnormality. Soft tissues are grossly normal. IMPRESSION: Left lower and middle lobes airspace consolidation. Moderate left pleural effusion. Electronically Signed   By: Fidela Salisbury M.D.   On: 09/25/2017 16:39   MICRO DATA: MRSA PCR  Urine    Blood Resp   ASSESSMENT/PLAN   76 y/o lady with PMH of Parkinsons, CKD IV (recently taken off IHD), ? COPD, CAD s/p stent placement, DM, Afib (not on anticoagulation given risk of falls), CHF - EF 35%, HTN, dyslipidemia, depression, history of DVT who presented to the ED with hypoxemic and hypercapnic respiratory failure after aspiration.  Transferred to ICU for:  Acute hypercapnic respiratory failure Acute hypoxemic respiratory failure Pneumonia - CAP with superimposed aspiration Severe sepsis with shock Lactic acidosis CKD - IV Parkinsons CKD IV (recently taken off IHD) ? COPD CAD s/p stent placement DM Afib (not on anticoagulation given risk of falls) CHF - EF 35% HTN Dyslipidemia Depression History of DVT   PLAN PRN O2 to keep SPO2>90% Trend lactic acid Continue cefepime and vancomycin. F/U Blood cultures Methylprednisone 40 mg Q6h. Duonebs Q6h and albuterol Q2h RPN Echo in am Monitor U/O.  Trend creatinine. Resume regular diet as tolerated.  Aspiration precautions Start Protonix 40 mg daily. DVT prophylaxis with SCDs  GI prophylaxis Protonix Sliding scale for glycemic control.   Patient is a DNR/DNI with no escalation of care OK to transfer out of the ICU  Eural Holzschuh S. [facility] ANP-BC Pulmonary and Critical Care Medicine [facility] Pager [phone] or [phone]

## 2017-09-26 NOTE — Progress Notes (Signed)
*  PRELIMINARY RESULTS* Echocardiogram 2D Echocardiogram has been performed.  Molly Wolf 09/26/2017, 9:21 AM

## 2017-09-26 NOTE — Progress Notes (Signed)
[facility] [place] [facility]   PATIENT NAME: Molly CunasJudy Belsky    MR#:  [id number]  DATE OF BIRTH:  17-Nov-1940  SUBJECTIVE: Patient seen at bedside, admitted for respiratory failure/pneumonia initially required BiPAP so she is admitted to ICU but now moved out of ICU and she is now on nasal cannula 2 L.  Has mild shortness of breath but much better than yesterday.  Continues shaking of the head due to Parkinson disease.  CHIEF COMPLAINT:   Chief Complaint  Patient presents with  . Respiratory Arrest    REVIEW OF SYSTEMS:   ROS CONSTITUTIONAL: No fever, fatigue or weakness.  EYES: No blurred or double vision.  EARS, NOSE, AND THROAT: No tinnitus or ear pain.  RESPIRATORY: Cough, shortness of breath.Marland Kitchen.  CARDIOVASCULAR: No chest pain, orthopnea, edema.  GASTROINTESTINAL: No nausea, vomiting, diarrhea or abdominal pain.  GENITOURINARY: No dysuria, hematuria.  ENDOCRINE: No polyuria, nocturia,  HEMATOLOGY: No anemia, easy bruising or bleeding SKIN: No rash or lesion. MUSCULOSKELETAL: No joint pain or arthritis.   NEUROLOGIC: No tingling, numbness, weakness.  PSYCHIATRY: No anxiety or depression.   DRUG ALLERGIES:   Allergies  Allergen Reactions  . Prednisone Other (See Comments)    halluciantions    VITALS:  Blood pressure 111/78, pulse (!) 108, temperature 97.8 F (36.6 C), resp. rate 19, height 5\' 6"  (1.676 m), weight 59 kg (130 lb), SpO2 98 %.  PHYSICAL EXAMINATION:  GENERAL:  76 y.o.-year-old patient lying in the bed with no acute distress.  Patient has continued shaking of hands and head secondary to Parkinson disease. EYES: Pupils equal, round, reactive to light and accommodation. No scleral icterus. Extraocular muscles intact.  HEENT: Head atraumatic, normocephalic. Oropharynx and nasopharynx clear.  NECK:  Supple, no jugular venous distention. No thyroid enlargement, no tenderness.  LUNGS:  decreased breath sound bilaterally.Marland Kitchen.   CARDIOVASCULAR: S1, S2 normal. No murmurs, rubs, or gallops.  ABDOMEN: Soft, nontender, nondistended. Bowel sounds present. No organomegaly or mass.  EXTREMITIES: No pedal edema, cyanosis, or clubbing.  NEUROLOGIC: Cranial nerves II through XII are intact.  decreased muscle strength overall but it is not new.   PSYCHIATRIC: The patient is alert and oriented x 3.  SKIN: No obvious rash, lesion, or ulcer.    LABORATORY PANEL:   CBC Recent Labs  Lab 09/26/17 0212  WBC 7.2  HGB 12.2  HCT 39.6  PLT 106*   ------------------------------------------------------------------------------------------------------------------  Chemistries  Recent Labs  Lab 09/25/17 1545 09/25/17 2133 09/26/17 0212  NA 137 139 140  K 5.2* 4.5 4.8  CL 110 111 113*  CO2 22 21* 20*  GLUCOSE 343* 346* 207*  BUN 50* 50* 47*  CREATININE 2.06* 1.94* 1.96*  CALCIUM 8.8* 8.0* 8.1*  MG  --  1.7  --   AST 19  --   --   ALT <5*  --   --   ALKPHOS 63  --   --   BILITOT 1.0  --   --    ------------------------------------------------------------------------------------------------------------------  Cardiac Enzymes Recent Labs  Lab 09/26/17 0212  TROPONINI <0.03   ------------------------------------------------------------------------------------------------------------------  RADIOLOGY:  Dg Chest Port 1 View  Result Date: 09/25/2017 CLINICAL DATA:  Witnessed aspiration. EXAM: PORTABLE CHEST 1 VIEW COMPARISON:  08/07/2017 FINDINGS: Enlarged cardiac silhouette. Calcific atherosclerotic disease of the aorta and tortuosity. Left lower lobe, left middle lobe airspace consolidation. Moderate left pleural effusion. Osseous structures are without acute abnormality. Soft tissues are grossly normal. IMPRESSION: Left lower and middle lobes airspace  consolidation. Moderate left pleural effusion. Electronically Signed   By: Ted Mcalpineobrinka  Dimitrova M.D.   On: 09/25/2017 16:39    EKG:   Orders placed or performed  during the hospital encounter of 09/25/17  . ED EKG  . ED EKG  . EKG 12-Lead  . EKG 12-Lead    ASSESSMENT AND PLAN:  #1 acute on chronic respiratory failure with respiratory acidosis: Required BiPAP and was in ICU since admission and now moved out of ICU to medical floor.  Patient also had large left-sided pneumonia.  Continue 2.  Possible aspiration pneumonia: Unable to tolerate NG tube.  Has left-sided pneumonia.  On IV antibiotics.  Patient is on cefepime, vancomycin. 3.  History of CKD stage IV.  Cautious with fluids.  Initially received fluid.  Seen by nephrology.  Renal function appears to be relatively stable at this time.  During the last admission patient received temporary dialysis .  Monitor daily kidney function labs. 4.  Chronic systolic heart failure with EF 35%. 5.  CODE STATUS DNR. #6 severe Parkinson disease. #7 diabetes mellitus type 2: Continue sliding scale insulin with coverage.  The patient on diet.  Discussed with patient's daughter at bedside. All the records are reviewed and case discussed with Care Management/Social Workerr. Management plans discussed with the patient, family and they are in agreement.  CODE STATUS: DNR since his admission.  TOTAL TIME TAKING CARE OF THIS PATIENT: 35 minutes.   POSSIBLE D/C IN 2-3DAYS, DEPENDING ON CLINICAL CONDITION.   Katha HammingSnehalatha Monasia Lair M.D on 09/26/2017 at 11:46 AM  Between 7am to 6pm - Pager - [phone]  After 6pm go to [email or web address] - password [email or web address] [facility]  [facility]  Office  [phone]  CC: Primary care physician; Lauro RegulusAnderson, Marshall W, MD   Note: This dictation was prepared with Dragon dictation along with smaller phrase technology. Any transcriptional errors that result from this process are unintentional.

## 2017-09-26 NOTE — Progress Notes (Signed)
[facility]  ROUNDING NOTE   Subjective:  Patient well-known to [place] from prior hospitalization. At the last hospitalization she had acute renal failure that required temporary hemodialysis. She subsequently came back to the office for follow-up. She did have her PermCath removed recently. She presents now with pneumonia and sepsis. Patient has been made DNR and per pulmonary/critical care there will be no escalation of care.  Objective:  Vital signs in last 24 hours:  Temp:  [97.3 F (36.3 C)-97.8 F (36.6 C)] 97.8 F (36.6 C) (12/16 0800) Pulse Rate:  [102-125] 108 (12/16 0800) Resp:  [11-25] 19 (12/16 0800) BP: (73-149)/(60-91) 111/78 (12/16 0800) SpO2:  [87 %-100 %] 98 % (12/16 0850) FiO2 (%):  [45 %] 45 % (12/16 0850) Weight:  [59 kg (130 lb)] 59 kg (130 lb) (12/15 1549)  Weight change:  Filed Weights   09/25/17 1549  Weight: 59 kg (130 lb)    Intake/Output: I/O last 3 completed shifts: In: 1608.3 [I.V.:500; IV Piggyback:1108.3] Out: 80 [Urine:80]   Intake/Output this shift:  No intake/output data recorded.  Physical Exam: General: Critically ill appearing  Head: Normocephalic, atraumatic. Moist oral mucosal membranes  Eyes: Anicteric  Neck: Supple, trachea midline  Lungs:  Scattered rhonchi normal effort  Heart: S1S2 no rubs  Abdomen:  Soft, nontender, bowel sounds present  Extremities: no peripheral edema.  Neurologic: Letharigc, not following commands  Skin: No lesions  Access: No dialysis access    Basic Metabolic Panel: Recent Labs  Lab 09/25/17 1545 09/25/17 2133 09/26/17 0212  NA 137 139 140  K 5.2* 4.5 4.8  CL 110 111 113*  CO2 22 21* 20*  GLUCOSE 343* 346* 207*  BUN 50* 50* 47*  CREATININE 2.06* 1.94* 1.96*  CALCIUM 8.8* 8.0* 8.1*  MG  --  1.7  --   PHOS  --  4.4  --     Liver Function Tests: Recent Labs  Lab 09/25/17 1545  AST 19  ALT <5*  ALKPHOS 63  BILITOT 1.0  PROT 6.2*  ALBUMIN 3.3*   No results for  input(s): LIPASE, AMYLASE in the last 168 hours. No results for input(s): AMMONIA in the last 168 hours.  CBC: Recent Labs  Lab 09/25/17 1545 09/25/17 2133 09/26/17 0212  WBC 9.4  --  7.2  NEUTROABS 7.2*  --   --   HGB 13.1 12.4 12.2  HCT 43.0 40.7 39.6  MCV 103.0*  --  103.0*  PLT 169  --  106*    Cardiac Enzymes: Recent Labs  Lab 09/25/17 1545 09/25/17 2133 09/26/17 0212  TROPONINI <0.03 <0.03 <0.03    BNP: Invalid input(s): POCBNP  CBG: Recent Labs  Lab 09/25/17 1545 09/25/17 2151 09/26/17 0056 09/26/17 0630  GLUCAP 269* 279* 212* 137*    Microbiology: Results for orders placed or performed during the hospital encounter of 09/25/17  Culture, blood (routine x 2)     Status: None (Preliminary result)   Collection Time: 09/25/17  3:45 PM  Result Value Ref Range Status   Specimen Description BLOOD RIGHT ANTECUBITAL  Final   Special Requests   Final    BOTTLES DRAWN AEROBIC AND ANAEROBIC Blood Culture adequate volume   Culture NO GROWTH < 24 HOURS  Final   Report Status PENDING  Incomplete  Culture, blood (routine x 2)     Status: None (Preliminary result)   Collection Time: 09/25/17  4:03 PM  Result Value Ref Range Status   Specimen Description BLOOD BLOOD LEFT FOREARM  Final   Special Requests   Final    BOTTLES DRAWN AEROBIC AND ANAEROBIC Blood Culture results may not be optimal due to an inadequate volume of blood received in culture bottles   Culture NO GROWTH < 24 HOURS  Final   Report Status PENDING  Incomplete  CULTURE, BLOOD (ROUTINE X 2) w Reflex to ID Panel     Status: None (Preliminary result)   Collection Time: 09/25/17  7:26 PM  Result Value Ref Range Status   Specimen Description BLOOD RIGHT HAND  Final   Special Requests   Final    BOTTLES DRAWN AEROBIC AND ANAEROBIC Blood Culture adequate volume   Culture NO GROWTH < 12 HOURS  Final   Report Status PENDING  Incomplete  CULTURE, BLOOD (ROUTINE X 2) w Reflex to ID Panel     Status: None  (Preliminary result)   Collection Time: 09/25/17  7:39 PM  Result Value Ref Range Status   Specimen Description BLOOD LEFT ANTECUBITAL  Final   Special Requests   Final    BOTTLES DRAWN AEROBIC AND ANAEROBIC Blood Culture adequate volume   Culture NO GROWTH < 12 HOURS  Final   Report Status PENDING  Incomplete  MRSA PCR Screening     Status: None   Collection Time: 09/25/17  7:41 PM  Result Value Ref Range Status   MRSA by PCR NEGATIVE NEGATIVE Final    Comment:        The GeneXpert MRSA Assay (FDA approved for NASAL specimens only), is one component of a comprehensive MRSA colonization surveillance program. It is not intended to diagnose MRSA infection nor to guide or monitor treatment for MRSA infections.   Gastrointestinal Panel by PCR , Stool     Status: None   Collection Time: 09/25/17  7:41 PM  Result Value Ref Range Status   Campylobacter species NOT DETECTED NOT DETECTED Final   Plesimonas shigelloides NOT DETECTED NOT DETECTED Final   Salmonella species NOT DETECTED NOT DETECTED Final   Yersinia enterocolitica NOT DETECTED NOT DETECTED Final   Vibrio species NOT DETECTED NOT DETECTED Final   Vibrio cholerae NOT DETECTED NOT DETECTED Final   Enteroaggregative E coli (EAEC) NOT DETECTED NOT DETECTED Final   Enteropathogenic E coli (EPEC) NOT DETECTED NOT DETECTED Final   Enterotoxigenic E coli (ETEC) NOT DETECTED NOT DETECTED Final   Shiga like toxin producing E coli (STEC) NOT DETECTED NOT DETECTED Final   Shigella/Enteroinvasive E coli (EIEC) NOT DETECTED NOT DETECTED Final   Cryptosporidium NOT DETECTED NOT DETECTED Final   Cyclospora cayetanensis NOT DETECTED NOT DETECTED Final   Entamoeba histolytica NOT DETECTED NOT DETECTED Final   Giardia lamblia NOT DETECTED NOT DETECTED Final   Adenovirus F40/41 NOT DETECTED NOT DETECTED Final   Astrovirus NOT DETECTED NOT DETECTED Final   Norovirus GI/GII NOT DETECTED NOT DETECTED Final   Rotavirus A NOT DETECTED NOT  DETECTED Final   Sapovirus (I, II, IV, and V) NOT DETECTED NOT DETECTED Final  C difficile quick scan w PCR reflex     Status: None   Collection Time: 09/25/17  7:41 PM  Result Value Ref Range Status   C Diff antigen NEGATIVE NEGATIVE Final   C Diff toxin NEGATIVE NEGATIVE Final   C Diff interpretation No C. difficile detected.  Final    Coagulation Studies: Recent Labs    09/25/17 1545  LABPROT 15.0  INR 1.19    Urinalysis: Recent Labs    09/25/17 1941  COLORURINE AMBER*  LABSPEC 1.019  PHURINE 5.0  GLUCOSEU 50*  HGBUR NEGATIVE  BILIRUBINUR NEGATIVE  KETONESUR 5*  PROTEINUR 100*  NITRITE NEGATIVE  LEUKOCYTESUR NEGATIVE      Imaging: Dg Chest Port 1 View  Result Date: 09/25/2017 CLINICAL DATA:  Witnessed aspiration. EXAM: PORTABLE CHEST 1 VIEW COMPARISON:  08/07/2017 FINDINGS: Enlarged cardiac silhouette. Calcific atherosclerotic disease of the aorta and tortuosity. Left lower lobe, left middle lobe airspace consolidation. Moderate left pleural effusion. Osseous structures are without acute abnormality. Soft tissues are grossly normal. IMPRESSION: Left lower and middle lobes airspace consolidation. Moderate left pleural effusion. Electronically Signed   By: Ted Mcalpineobrinka  Dimitrova M.D.   On: 09/25/2017 16:39     Medications:   . ceFEPime (MAXIPIME) IV    . dextrose 5 % and 0.45% NaCl 50 mL/hr at 09/26/17 1015   . insulin aspart  0-15 Units Subcutaneous Q6H  . pantoprazole (PROTONIX) IV  40 mg Intravenous Q24H   ipratropium-albuterol, morphine injection, nitroGLYCERIN, [DISCONTINUED] ondansetron **OR** ondansetron (ZOFRAN) IV, polyethylene glycol  Assessment/ Plan:  76 y.o. female with coronary artery disease, congestive heart failure, pulmonary hypertension, parkinson's, diabetes mellitus type II, COPD, DVT, atrial fibrillation, hyperlipidemia, anemia, anxiety, patellar fracture now admitted with severe sepsis and pneumonia.   1.  Chronic kidney disease stage IV.   2.  Acute respiratory failure due to pneumonia. 3.  Diabetes mellitus type 2 with CKD.  4.  Chronic systolic heart failure.   Plan:  The patient presents with a very severe illness at this point in time.  She has extensive left-sided pneumonia with resultant sepsis and acute respiratory failure.  During her last admission we saw her for acute renal failure that required temporary hemodialysis.  She had a PermCath in place that was removed as an outpatient.  Her renal function despite the sepsis appears to be relatively stable at the moment.  Therefore no indication for dialysis.  No escalation of care will be pursued as per pulmonary/critical care.  Therefore we recommend continued periodic monitoring of renal function for now.  Otherwise plan as per pulmonary/critical care.  Thanks for consultation.     LOS: 1 Carzell Saldivar 12/16/201810:47 AM

## 2017-09-27 LAB — BASIC METABOLIC PANEL
Anion gap: 6 (ref 5–15)
BUN: 52 mg/dL — ABNORMAL HIGH (ref 6–20)
CO2: 20 mmol/L — ABNORMAL LOW (ref 22–32)
Calcium: 8.2 mg/dL — ABNORMAL LOW (ref 8.9–10.3)
Chloride: 113 mmol/L — ABNORMAL HIGH (ref 101–111)
Creatinine, Ser: 2.39 mg/dL — ABNORMAL HIGH (ref 0.44–1.00)
GFR calc Af Amer: 21 mL/min — ABNORMAL LOW (ref >60)
GFR calc non Af Amer: 18 mL/min — ABNORMAL LOW (ref >60)
Glucose, Bld: 195 mg/dL — ABNORMAL HIGH (ref 65–99)
Potassium: 5.2 mmol/L — ABNORMAL HIGH (ref 3.5–5.1)
Sodium: 139 mmol/L (ref 135–145)

## 2017-09-27 LAB — CBC
HEMATOCRIT: 38.3 % (ref 35.0–47.0)
HEMOGLOBIN: 11.8 g/dL — AB (ref 12.0–16.0)
MCH: 31.8 pg (ref 26.0–34.0)
MCHC: 30.9 g/dL — AB (ref 32.0–36.0)
MCV: 103 fL — AB (ref 80.0–100.0)
Platelets: 105 10*3/uL — ABNORMAL LOW (ref 150–440)
RBC: 3.71 MIL/uL — ABNORMAL LOW (ref 3.80–5.20)
RDW: 19.2 % — AB (ref 11.5–14.5)
WBC: 5.6 10*3/uL (ref 3.6–11.0)

## 2017-09-27 LAB — GLUCOSE, CAPILLARY
GLUCOSE-CAPILLARY: 117 mg/dL — AB (ref 65–99)
Glucose-Capillary: 354 mg/dL — ABNORMAL HIGH (ref 65–99)
Glucose-Capillary: 195 mg/dL — ABNORMAL HIGH (ref 65–99)
Glucose-Capillary: 92 mg/dL (ref 65–99)

## 2017-09-27 LAB — URINE CULTURE: CULTURE: NO GROWTH

## 2017-09-27 LAB — PROCALCITONIN

## 2017-09-27 MED ORDER — AMOXICILLIN-POT CLAVULANATE 500-125 MG PO TABS
500.0000 mg | ORAL_TABLET | Freq: Two times a day (BID) | ORAL | Status: DC
  Administered 2017-09-27 – 2017-10-02 (×10): 500 mg via ORAL
  Filled 2017-09-27 (×11): qty 1

## 2017-09-27 MED ORDER — RENA-VITE PO TABS
1.0000 | ORAL_TABLET | Freq: Every day | ORAL | Status: DC
  Administered 2017-09-27 – 2017-09-30 (×4): 1 via ORAL
  Filled 2017-09-27 (×4): qty 1

## 2017-09-27 MED ORDER — SODIUM POLYSTYRENE SULFONATE 15 GM/60ML PO SUSP
15.0000 g | Freq: Once | ORAL | Status: AC
Start: 2017-09-27 — End: 2017-09-27
  Administered 2017-09-27: 15 g via ORAL
  Filled 2017-09-27: qty 60

## 2017-09-27 MED ORDER — ADULT MULTIVITAMIN W/MINERALS CH
1.0000 | ORAL_TABLET | ORAL | Status: DC
  Administered 2017-09-27 – 2017-09-30 (×2): 1 via ORAL
  Filled 2017-09-27 (×2): qty 1

## 2017-09-27 MED ORDER — VITAMIN C 500 MG PO TABS
500.0000 mg | ORAL_TABLET | Freq: Two times a day (BID) | ORAL | Status: DC
  Administered 2017-09-27 – 2017-10-01 (×8): 500 mg via ORAL
  Filled 2017-09-27 (×11): qty 1

## 2017-09-27 MED ORDER — NEPRO/CARBSTEADY PO LIQD
237.0000 mL | Freq: Two times a day (BID) | ORAL | Status: DC
  Administered 2017-09-27 – 2017-10-03 (×8): 237 mL via ORAL

## 2017-09-27 NOTE — Evaluation (Signed)
Clinical/Bedside Swallow Evaluation Patient Details  Name: Molly Wolf MRN: [id number] Date of Birth: [birth year]  Today's Date: 09/27/2017 Time: SLP Start Time (ACUTE ONLY): 1500 SLP Stop Time (ACUTE ONLY): 1600 SLP Time Calculation (min) (ACUTE ONLY): 60 min  Past Medical History:  Past Medical History:  Diagnosis Date  . Anxiety   . Arthritis   . Chronic combined systolic and diastolic CHF (congestive heart failure) (HCC)    a. 01/2008 Echo ([facility]): EF > 55%, mod-sev LVH w/ grade 1 DD, biatrial enlargement, mild AS, trace MR/TR; b. EF 45-50%, GR2DD, mild to moderate aortic stenosis, mild aortic insufficieny, mild to moderate mitral regurgitation, mild tricuspid regurgitation, mild biatrial enlargement, and a small pericardial effusion  . CKD (chronic kidney disease), stage IV (HCC)   . Closed patellar sleeve fracture of right knee    a. 01/2017 - conservatively managed.  Marland Kitchen. COPD (chronic obstructive pulmonary disease) (HCC)   . Coronary artery disease    a. s/p remote stenting of unknown vessel @ [facility].  . Depression   . Diabetes mellitus with complication (HCC)   . DVT (deep venous thrombosis) (HCC)    a. in the setting of right patellar fracture on Coumadin  . Hyperlipidemia   . Hypertension   . Parkinson disease (HCC)   . Renal insufficiency    Past Surgical History:  Past Surgical History:  Procedure Laterality Date  . BACK SURGERY    . CORONARY ANGIOPLASTY WITH STENT PLACEMENT    . DIALYSIS/PERMA CATHETER INSERTION N/A 04/22/2017   Procedure: Dialysis/Perma Catheter Insertion;  Surgeon: Annice Needyew, Jason S, MD;  Location: [facility] INVASIVE CV LAB;  Service: Cardiovascular;  Laterality: N/A;  . DIALYSIS/PERMA CATHETER REMOVAL N/A 09/23/2017   Procedure: DIALYSIS/PERMA CATHETER REMOVAL;  Surgeon: Annice Needyew, Jason S, MD;  Location: [facility] INVASIVE CV LAB;  Service: Cardiovascular;  Laterality: N/A;  . ESOPHAGOGASTRODUODENOSCOPY N/A 04/09/2017   Procedure: ESOPHAGOGASTRODUODENOSCOPY (EGD);   Surgeon: Wyline MoodAnna, Kiran, MD;  [facility];  Service: Endoscopy;  Laterality: N/A;  . IVC FILTER INSERTION N/A 04/23/2017   Procedure: IVC Filter Insertion;  Surgeon: Renford DillsSchnier, Gregory G, MD;  Location: [facility] INVASIVE CV LAB;  Service: Cardiovascular;  Laterality: N/A;  . NECK SURGERY     HPI:  Pt is a 76 y.o. female with a known history of CK D stage IV who has recently come off of temporary dialysis that was initiated few months ago, COPD not on home oxygen, coronary artery disease, diabetes mellitus, atrial fibrillation not on anticoagulation due to risk of falls, combined congestive heart failure, hypertension, Parkinson's disease presents to the hospital secondary to acute respiratory distress. Patient at baseline is non-ambulated lead lately using a wheelchair. Lives at home with her grandson. She has some trouble swallowing, mostly slow swallowing. Family denies any history of overt aspiration episodes but some difficulty chewing the foods. She has been lately having trouble breathing when was started on inhalers. This afternoon patient had still food in her mouth while she tried to use her Spiriva inhaler and had a possible aspiration episode with hypoxia, cyanosis and difficulty breathing. EMS was called and patient was brought to the emergency room. Initiation of BiPAP was attempted however patient had another vomiting episode here. Initial ABG shows respiratory acidosis with pH of 7.17. Patient denies any nausea now. No fevers or chills. Heart rate is slightly elevated and respiratory rate is going up to the 30s with minimal exertion. Patient is being admitted to stepdown unit. In the ED while being treated for respiratory distress,  Patient started to throw up while on bipap, mask removed immediately and mouth suctioned with yankauer. Patient then placed on 4l [place], with sats 90-91%. Pt is currently on a dysphagia level 2 diet per MD.   Assessment / Plan / Recommendation Clinical Impression   Pt appears to present w/ mild-moderate Oral phase dysphagia c/b increased mastication time/effort w/ increased texture food trials d/t having to anteriorly munch/mash the foods w/ the ~4 lower teeth; the upper denture plate is ill-fitting as well(pt does not want to use adhesive to secure it). Pt would benefit from a pureed diet d/t mastication impairment as well as the involuntary head movements (d/t Parkinson's Dis) impacting her ability to accept/masticate boluses in an organized manner. Pt was able to consume trials of thin and Nectar liquids via straw (not using UEs to help hold cup) w/ no immediate, overt s/s of aspiration noted. Due to pt's oral phase dysphagia and baseline Neurological disease, pt's risk for pharyngeal phase dysphagia is increased. Recommend a dysphagia level 1(puree) diet w/ Thin liquids; aspiration precautions; monitoring at meals for oral clearing. Pills in Puree - Crushed as able to. Feeding Assistance w/ all po's and to monitor for any overt s/s of aspiration; oral clearing. This would be the best diet consistency for pt at this time to maximize oral intake safely.  SLP Visit Diagnosis: Dysphagia, oral phase (R13.11);Dysphagia, oropharyngeal phase (R13.12)    Aspiration Risk  Mild aspiration risk    Diet Recommendation  Dysphagia level 1(puree) w/ Thin liquids; aspiration precautions; feeding support at all meals  Medication Administration: Crushed with puree    Other  Recommendations Recommended Consults: (Dietician f/u) Oral Care Recommendations: Oral care BID;Staff/trained caregiver to provide oral care   Follow up Recommendations Skilled Nursing facility      Frequency and Duration  TBD at SNF         Prognosis Prognosis for Safe Diet Advancement: Fair Barriers to Reach Goals: Cognitive deficits;Severity of deficits;Time post onset      Swallow Study   General Date of Onset: 09/25/17 HPI: Pt is a 76 y.o. female with a known history of CK D stage IV who  has recently come off of temporary dialysis that was initiated few months ago, COPD not on home oxygen, coronary artery disease, diabetes mellitus, atrial fibrillation not on anticoagulation due to risk of falls, combined congestive heart failure, hypertension, Parkinson's disease presents to the hospital secondary to acute respiratory distress. Patient at baseline is non-ambulated lead lately using a wheelchair. Lives at home with her grandson. She has some trouble swallowing, mostly slow swallowing. Family denies any history of overt aspiration episodes but some difficulty chewing the foods. She has been lately having trouble breathing when was started on inhalers. This afternoon patient had still food in her mouth while she tried to use her Spiriva inhaler and had a possible aspiration episode with hypoxia, cyanosis and difficulty breathing. EMS was called and patient was brought to the emergency room. Initiation of BiPAP was attempted however patient had another vomiting episode here. Initial ABG shows respiratory acidosis with pH of 7.17. Patient denies any nausea now. No fevers or chills. Heart rate is slightly elevated and respiratory rate is going up to the 30s with minimal exertion. Patient is being admitted to stepdown unit. In the ED while being treated for respiratory distress, Patient started to throw up while on bipap, mask removed immediately and mouth suctioned with yankauer. Patient then placed on 4l , with sats 90-91%. Pt is  currently on a dysphagia level 2 diet per MD. Type of Study: Bedside Swallow Evaluation Previous Swallow Assessment: none reported Diet Prior to this Study: Dysphagia 2 (chopped);Thin liquids Temperature Spikes Noted: No(wbc not elevated;  temp 99.1) Respiratory Status: Nasal cannula(1.5-2 liters) History of Recent Intubation: No Behavior/Cognition: Alert;Cooperative;Pleasant mood;Requires cueing;Doesn't follow directions(drowsy; involuntary head movements,  tremors) Oral Cavity Assessment: Dry(dry lips; gums sore per report) Oral Care Completed by SLP: Yes Oral Cavity - Dentition: Dentures, top(~4 front lower teeth (only)) Vision: (n/a) Self-Feeding Abilities: Total assist(did not use UEs) Patient Positioning: Upright in bed Baseline Vocal Quality: Low vocal intensity(soft spokent - baseline Parkinson's Dis.) Volitional Cough: Cognitively unable to elicit Volitional Swallow: Unable to elicit    Oral/Motor/Sensory Function Overall Oral Motor/Sensory Function: Mild impairment(overall weakness and reduced follow through)   [place] chips: Within functional limits Presentation: Spoon(fed; 2 trials)   Thin Liquid Thin Liquid: Impaired Presentation: Straw(fed; ~2 ozs) Oral Phase Impairments: Reduced lingual movement/coordination(involuntary head movements) Pharyngeal  Phase Impairments: (none; audible swallowing x1) Other Comments: need to be careful during feeding of pt; monitor pace and amount    Nectar Thick Nectar Thick Liquid: Impaired Presentation: Straw(fed; ~2 ozs) Oral Phase Impairments: Reduced lingual movement/coordination(involuntary head movements) Pharyngeal Phase Impairments: (none)   Honey Thick Honey Thick Liquid: Not tested   Puree Puree: Within functional limits Presentation: Spoon(fed; 6 trials) Other Comments: involuntary head movements   Solid   GO   Solid: Impaired Presentation: Spoon(fed; 4 trials) Oral Phase Impairments: Reduced lingual movement/coordination;Impaired mastication Oral Phase Functional Implications: Impaired mastication(munched on the trials anteriorly in mouth d/t dentition) Pharyngeal Phase Impairments: (none) Other Comments: ~4 front lower teeth only    Functional Assessment Tool Used: clinical judgement Functional Limitations: Swallowing Swallow Current Status (N8295): At least 20 percent but less than 40 percent impaired, limited or restricted Swallow Goal Status [phone]): At least 20  percent but less than 40 percent impaired, limited or restricted Swallow Discharge Status [phone]): At least 20 percent but less than 40 percent impaired, limited or restricted     Jerilynn Som, MS, CCC-SLP [place] Beachem 09/27/2017,4:13 PM

## 2017-09-27 NOTE — Progress Notes (Addendum)
Initial Nutrition Assessment  DOCUMENTATION CODES:   Severe malnutrition in context of chronic illness  INTERVENTION:  Recommend recheck Mg and P labs as pt at high refeeding risk  Recommend SLP evaluation    Nepro Shake po BID, each supplement provides 425 kcal and 19 grams protein  Dysphagia 2 diet  Vitamin C 500 mg BID as pt noted to have Scurvy in October   MVI twice weekly  Renal MV daily   NUTRITION DIAGNOSIS:   Severe Malnutrition related to chronic illness(CKD IV, parkinsons, CHF, Scurvy ) as evidenced by energy intake < or equal to 75% for > or equal to 1 month, mild fat depletion, moderate fat depletion.  GOAL:   Patient will meet greater than or equal to 90% of their needs  MONITOR:   PO intake, Supplement acceptance, Labs, Weight trends, Skin, I & O's  REASON FOR ASSESSMENT:   Malnutrition Screening Tool    ASSESSMENT:   76 y.o. white female with h/o scurvy, hypertension, hyperlipidemia, Parkinson's, DVT, Diabetes mellitus type II, Depression, Coronary artery disease, COPD, Anxiety, Osteoarthritis admitted for PNA and CHF  Met with pt and pt's family in room today. Pt slightly confused so history obtained from pt's family. RD is familiar with this pt from previous admits. RD last saw pt in October where pt was found to have scurvy. Pt eating well prior to discharge in October but per family, pt has had a slow decline in her appetite and oral intake since discharging. Pt has not continued to take vitamin C at home and has progressed now to poor dentition and severe bruising and petechia over her entire body. Family reports that pt is now experiencing loose, sore teeth (bottom incisors) and is having difficulty chewing foods. Per chart, pt lost 22lbs(14%) from June to September but has remained weight stable since October. RD discussed with family the importance of protein supplements and vitamin supplementation at home. Pt has not been drinking any supplements at  home. RD will order vitamin C, Nepro, and MVI. RD will also change pt to dysphagia 2 diet to make it easier for her to chew foods. Family is concerned that pt may be holding food in her mouth; recommend SLP evaluation. Pt likely at high refeeding risk; recommend recheck Mg and P labs.    Medications reviewed and include: Augmentin, insulin, MVI, protonix  Labs reviewed: K 5.2(H), Cl 113(H), BUN 52(H), creat 2.39(H), Ca 8.2(L) P 4.4 wnl, Mg 1.7 wnl- 12/15 BNP 945(H)- 12/15 cbgs- 207, 195 x 24 hrs AIC 6.8(H)- 10/25 Ascorbic acid- 0.36m/dL- 10-27  Nutrition-Focused physical exam completed. Findings are mild fat depletions, moderate to severe muscle depletions over entire body, and moderate edema in BLE. Pt noted to have dry, sparse hair and reports recent hair loss. Pt noted to have severe bruising and petechiae over entire body.     Diet Order:  DIET DYS 2 Room service appropriate? No; Fluid consistency: Thin; Fluid restriction: 1200 mL Fluid  EDUCATION NEEDS:   Education needs have been addressed  Skin:  Reviewed RN Assessment  Last BM:  12/16- type 7  Height:   Ht Readings from Last 1 Encounters:  09/25/17 5' 6"  (1.676 m)    Weight:   Wt Readings from Last 1 Encounters:  09/25/17 130 lb (59 kg)    Ideal Body Weight:  59 kg  BMI:  Body mass index is 20.98 kg/m.  Estimated Nutritional Needs:   Kcal:  1400-1600kca/day   Protein:  76-88g/day   Fluid:  >  1.4L/day   Koleen Distance MS, RD, LDN Pager #[phone] After Hours Pager: [phone]

## 2017-09-27 NOTE — Progress Notes (Signed)
Pharmacy Antibiotic Note  Molly Wolf is a 76 y.o. female admitted on 09/25/2017 with respiratory failure secondary to aspiration PNA.  Pharmacy has been consulted for Zosyn dosing.  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  Plan: Cefepime 1 g iv q 24 hours.   Height: 5\' 6"  (167.6 cm) Weight: 130 lb (59 kg) IBW/kg (Calculated) : 59.3  Temp (24hrs), Avg:98.1 F (36.7 C), Min:97.5 F (36.4 C), Max:99.1 F (37.3 C)  Recent Labs  Lab 09/25/17 1542 09/25/17 1545 09/25/17 1926 09/25/17 2133 09/26/17 0212 09/27/17 0439  WBC  --  9.4  --   --  7.2 5.6  CREATININE  --  2.06*  --  1.94* 1.96* 2.39*  LATICACIDVEN 2.3*  --  1.9 1.4  --   --     Estimated Creatinine Clearance: 18.4 mL/min (A) (by C-G formula based on SCr of 2.39 mg/dL (H)).    Allergies  Allergen Reactions  . Prednisone Other (See Comments)    halluciantions    Antimicrobials this admission: Anti-infectives (From admission, onward)   Start     Dose/Rate Route Frequency Ordered Stop   09/26/17 2330  ceFEPIme (MAXIPIME) 1 g in dextrose 5 % 50 mL IVPB     1 g 100 mL/hr over 30 Minutes Intravenous Daily-1800 09/25/17 2005     09/25/17 2230  piperacillin-tazobactam (ZOSYN) IVPB 3.375 g  Status:  Discontinued     3.375 g 12.5 mL/hr over 240 Minutes Intravenous Every 8 hours 09/25/17 1930 09/25/17 2005   09/25/17 2100  vancomycin (VANCOCIN) 1,250 mg in sodium chloride 0.9 % 250 mL IVPB     1,250 mg 166.7 mL/hr over 90 Minutes Intravenous  Once 09/25/17 2005 09/26/17 0017   09/25/17 1700  azithromycin (ZITHROMAX) 500 mg in dextrose 5 % 250 mL IVPB     500 mg 250 mL/hr over 60 Minutes Intravenous  Once 09/25/17 1646 09/25/17 1814   09/25/17 1630  piperacillin-tazobactam (ZOSYN) IVPB 3.375 g     3.375 g 100 mL/hr over 30 Minutes Intravenous  Once 09/25/17 1629 09/25/17 1703       Microbiology results: GI Panel: sent C diff: sent BCx: sent MRSA PCR: sent UCx: sent Recent Results (from  the past 240 hour(s))  Culture, blood (routine x 2)     Status: None (Preliminary result)   Collection Time: 09/25/17  3:45 PM  Result Value Ref Range Status   Specimen Description BLOOD RIGHT ANTECUBITAL  Final   Special Requests   Final    BOTTLES DRAWN AEROBIC AND ANAEROBIC Blood Culture adequate volume   Culture NO GROWTH 2 DAYS  Final   Report Status PENDING  Incomplete  Culture, blood (routine x 2)     Status: None (Preliminary result)   Collection Time: 09/25/17  4:03 PM  Result Value Ref Range Status   Specimen Description BLOOD BLOOD LEFT FOREARM  Final   Special Requests   Final    BOTTLES DRAWN AEROBIC AND ANAEROBIC Blood Culture results may not be optimal due to an inadequate volume of blood received in culture bottles   Culture NO GROWTH 2 DAYS  Final   Report Status PENDING  Incomplete  CULTURE, BLOOD (ROUTINE X 2) w Reflex to ID Panel     Status: None (Preliminary result)   Collection Time: 09/25/17  7:26 PM  Result Value Ref Range Status   Specimen Description BLOOD RIGHT HAND  Final   Special Requests   Final    BOTTLES DRAWN AEROBIC  AND ANAEROBIC Blood Culture adequate volume   Culture NO GROWTH 2 DAYS  Final   Report Status PENDING  Incomplete  CULTURE, BLOOD (ROUTINE X 2) w Reflex to ID Panel     Status: None (Preliminary result)   Collection Time: 09/25/17  7:39 PM  Result Value Ref Range Status   Specimen Description BLOOD LEFT ANTECUBITAL  Final   Special Requests   Final    BOTTLES DRAWN AEROBIC AND ANAEROBIC Blood Culture adequate volume   Culture NO GROWTH 2 DAYS  Final   Report Status PENDING  Incomplete  Urine culture     Status: None   Collection Time: 09/25/17  7:41 PM  Result Value Ref Range Status   Specimen Description URINE, RANDOM  Final   Special Requests NONE  Final   Culture   Final    NO GROWTH Performed at [facility], [address] [place], [place] [address]    Report Status 09/27/2017 FINAL  Final  MRSA PCR Screening      Status: None   Collection Time: 09/25/17  7:41 PM  Result Value Ref Range Status   MRSA by PCR NEGATIVE NEGATIVE Final    Comment:        The GeneXpert MRSA Assay (FDA approved for NASAL specimens only), is one component of a comprehensive MRSA colonization surveillance program. It is not intended to diagnose MRSA infection nor to guide or monitor treatment for MRSA infections.   Gastrointestinal Panel by PCR , Stool     Status: None   Collection Time: 09/25/17  7:41 PM  Result Value Ref Range Status   Campylobacter species NOT DETECTED NOT DETECTED Final   Plesimonas shigelloides NOT DETECTED NOT DETECTED Final   Salmonella species NOT DETECTED NOT DETECTED Final   Yersinia enterocolitica NOT DETECTED NOT DETECTED Final   Vibrio species NOT DETECTED NOT DETECTED Final   Vibrio cholerae NOT DETECTED NOT DETECTED Final   Enteroaggregative E coli (EAEC) NOT DETECTED NOT DETECTED Final   Enteropathogenic E coli (EPEC) NOT DETECTED NOT DETECTED Final   Enterotoxigenic E coli (ETEC) NOT DETECTED NOT DETECTED Final   Shiga like toxin producing E coli (STEC) NOT DETECTED NOT DETECTED Final   Shigella/Enteroinvasive E coli (EIEC) NOT DETECTED NOT DETECTED Final   Cryptosporidium NOT DETECTED NOT DETECTED Final   Cyclospora cayetanensis NOT DETECTED NOT DETECTED Final   Entamoeba histolytica NOT DETECTED NOT DETECTED Final   Giardia lamblia NOT DETECTED NOT DETECTED Final   Adenovirus F40/41 NOT DETECTED NOT DETECTED Final   Astrovirus NOT DETECTED NOT DETECTED Final   Norovirus GI/GII NOT DETECTED NOT DETECTED Final   Rotavirus A NOT DETECTED NOT DETECTED Final   Sapovirus (I, II, IV, and V) NOT DETECTED NOT DETECTED Final  C difficile quick scan w PCR reflex     Status: None   Collection Time: 09/25/17  7:41 PM  Result Value Ref Range Status   C Diff antigen NEGATIVE NEGATIVE Final   C Diff toxin NEGATIVE NEGATIVE Final   C Diff interpretation No C. difficile detected.  Final      Thank you for allowing pharmacy to be a part of this patient's care.  Gerre PebblesGarrett Aubrina Nieman 09/27/2017 10:46 AM

## 2017-09-27 NOTE — Progress Notes (Signed)
[facility]  ROUNDING NOTE   Subjective:   Admitted with pneumonia left. Confused this morning.    Creatinine 2.39 (1.96)  D5-1/2NS at 11mL/hr  Objective:  Vital signs in last 24 hours:  Temp:  [97.5 F (36.4 C)-99.1 F (37.3 C)] 97.5 F (36.4 C) (12/17 0422) Pulse Rate:  [55-120] 55 (12/17 0422) Resp:  [14-21] 21 (12/17 0422) BP: (107-112)/(63-74) 112/74 (12/17 0422) SpO2:  [93 %-99 %] 97 % (12/17 1001)  Weight change:  Filed Weights   09/25/17 1549  Weight: 59 kg (130 lb)    Intake/Output: I/O last 3 completed shifts: In: 2851.3 [P.O.:120; I.V.:1573; IV Piggyback:1158.3] Out: 305 [Urine:305]   Intake/Output this shift:  Total I/O In: 108.3 [I.V.:108.3] Out: -   Physical Exam: General: NAD, laying in bed  Head: Normocephalic, atraumatic. Moist oral mucosal membranes  Eyes: Anicteric, PERRL  Neck: Supple, trachea midline  Lungs:  Clear to auscultation  Heart: irregular  Abdomen:  Soft, nontender  Extremities: no peripheral edema.  Neurologic: Alert to self only, +tremor  Skin: No lesions  Access: none    Basic Metabolic Panel: Recent Labs  Lab 09/25/17 1545 09/25/17 2133 09/26/17 0212 09/27/17 0439  NA 137 139 140 139  K 5.2* 4.5 4.8 5.2*  CL 110 111 113* 113*  CO2 22 21* 20* 20*  GLUCOSE 343* 346* 207* 195*  BUN 50* 50* 47* 52*  CREATININE 2.06* 1.94* 1.96* 2.39*  CALCIUM 8.8* 8.0* 8.1* 8.2*  MG  --  1.7  --   --   PHOS  --  4.4  --   --     Liver Function Tests: Recent Labs  Lab 09/25/17 1545  AST 19  ALT <5*  ALKPHOS 63  BILITOT 1.0  PROT 6.2*  ALBUMIN 3.3*   No results for input(s): LIPASE, AMYLASE in the last 168 hours. No results for input(s): AMMONIA in the last 168 hours.  CBC: Recent Labs  Lab 09/25/17 1545 09/25/17 2133 09/26/17 0212 09/27/17 0439  WBC 9.4  --  7.2 5.6  NEUTROABS 7.2*  --   --   --   HGB 13.1 12.4 12.2 11.8*  HCT 43.0 40.7 39.6 38.3  MCV 103.0*  --  103.0* 103.0*  PLT 169  --   106* 105*    Cardiac Enzymes: Recent Labs  Lab 09/25/17 1545 09/25/17 2133 09/26/17 0212  TROPONINI <0.03 <0.03 <0.03    BNP: Invalid input(s): POCBNP  CBG: Recent Labs  Lab 09/26/17 0630 09/26/17 1211 09/26/17 1808 09/26/17 2347 09/27/17 0424  GLUCAP 137* 148* 246* 206* 195*    Microbiology: Results for orders placed or performed during the hospital encounter of 09/25/17  Culture, blood (routine x 2)     Status: None (Preliminary result)   Collection Time: 09/25/17  3:45 PM  Result Value Ref Range Status   Specimen Description BLOOD RIGHT ANTECUBITAL  Final   Special Requests   Final    BOTTLES DRAWN AEROBIC AND ANAEROBIC Blood Culture adequate volume   Culture NO GROWTH 2 DAYS  Final   Report Status PENDING  Incomplete  Culture, blood (routine x 2)     Status: None (Preliminary result)   Collection Time: 09/25/17  4:03 PM  Result Value Ref Range Status   Specimen Description BLOOD BLOOD LEFT FOREARM  Final   Special Requests   Final    BOTTLES DRAWN AEROBIC AND ANAEROBIC Blood Culture results may not be optimal due to an inadequate volume of blood received in culture  bottles   Culture NO GROWTH 2 DAYS  Final   Report Status PENDING  Incomplete  CULTURE, BLOOD (ROUTINE X 2) w Reflex to ID Panel     Status: None (Preliminary result)   Collection Time: 09/25/17  7:26 PM  Result Value Ref Range Status   Specimen Description BLOOD RIGHT HAND  Final   Special Requests   Final    BOTTLES DRAWN AEROBIC AND ANAEROBIC Blood Culture adequate volume   Culture NO GROWTH 2 DAYS  Final   Report Status PENDING  Incomplete  CULTURE, BLOOD (ROUTINE X 2) w Reflex to ID Panel     Status: None (Preliminary result)   Collection Time: 09/25/17  7:39 PM  Result Value Ref Range Status   Specimen Description BLOOD LEFT ANTECUBITAL  Final   Special Requests   Final    BOTTLES DRAWN AEROBIC AND ANAEROBIC Blood Culture adequate volume   Culture NO GROWTH 2 DAYS  Final   Report Status  PENDING  Incomplete  Urine culture     Status: None   Collection Time: 09/25/17  7:41 PM  Result Value Ref Range Status   Specimen Description URINE, RANDOM  Final   Special Requests NONE  Final   Culture   Final    NO GROWTH Performed at [facility], [address] [place], [place] [address]    Report Status 09/27/2017 FINAL  Final  MRSA PCR Screening     Status: None   Collection Time: 09/25/17  7:41 PM  Result Value Ref Range Status   MRSA by PCR NEGATIVE NEGATIVE Final    Comment:        The GeneXpert MRSA Assay (FDA approved for NASAL specimens only), is one component of a comprehensive MRSA colonization surveillance program. It is not intended to diagnose MRSA infection nor to guide or monitor treatment for MRSA infections.   Gastrointestinal Panel by PCR , Stool     Status: None   Collection Time: 09/25/17  7:41 PM  Result Value Ref Range Status   Campylobacter species NOT DETECTED NOT DETECTED Final   Plesimonas shigelloides NOT DETECTED NOT DETECTED Final   Salmonella species NOT DETECTED NOT DETECTED Final   Yersinia enterocolitica NOT DETECTED NOT DETECTED Final   Vibrio species NOT DETECTED NOT DETECTED Final   Vibrio cholerae NOT DETECTED NOT DETECTED Final   Enteroaggregative E coli (EAEC) NOT DETECTED NOT DETECTED Final   Enteropathogenic E coli (EPEC) NOT DETECTED NOT DETECTED Final   Enterotoxigenic E coli (ETEC) NOT DETECTED NOT DETECTED Final   Shiga like toxin producing E coli (STEC) NOT DETECTED NOT DETECTED Final   Shigella/Enteroinvasive E coli (EIEC) NOT DETECTED NOT DETECTED Final   Cryptosporidium NOT DETECTED NOT DETECTED Final   Cyclospora cayetanensis NOT DETECTED NOT DETECTED Final   Entamoeba histolytica NOT DETECTED NOT DETECTED Final   Giardia lamblia NOT DETECTED NOT DETECTED Final   Adenovirus F40/41 NOT DETECTED NOT DETECTED Final   Astrovirus NOT DETECTED NOT DETECTED Final   Norovirus GI/GII NOT DETECTED NOT DETECTED Final    Rotavirus A NOT DETECTED NOT DETECTED Final   Sapovirus (I, II, IV, and V) NOT DETECTED NOT DETECTED Final  C difficile quick scan w PCR reflex     Status: None   Collection Time: 09/25/17  7:41 PM  Result Value Ref Range Status   C Diff antigen NEGATIVE NEGATIVE Final   C Diff toxin NEGATIVE NEGATIVE Final   C Diff interpretation No C. difficile detected.  Final    Coagulation Studies: Recent Labs    09/25/17 1545  LABPROT 15.0  INR 1.19    Urinalysis: Recent Labs    09/25/17 1941  COLORURINE AMBER*  LABSPEC 1.019  PHURINE 5.0  GLUCOSEU 50*  HGBUR NEGATIVE  BILIRUBINUR NEGATIVE  KETONESUR 5*  PROTEINUR 100*  NITRITE NEGATIVE  LEUKOCYTESUR NEGATIVE      Imaging: Dg Chest Port 1 View  Result Date: 09/25/2017 CLINICAL DATA:  Witnessed aspiration. EXAM: PORTABLE CHEST 1 VIEW COMPARISON:  08/07/2017 FINDINGS: Enlarged cardiac silhouette. Calcific atherosclerotic disease of the aorta and tortuosity. Left lower lobe, left middle lobe airspace consolidation. Moderate left pleural effusion. Osseous structures are without acute abnormality. Soft tissues are grossly normal. IMPRESSION: Left lower and middle lobes airspace consolidation. Moderate left pleural effusion. Electronically Signed   By: Ted Mcalpineobrinka  Dimitrova M.D.   On: 09/25/2017 16:39     Medications:   . dextrose 5 % and 0.45% NaCl 50 mL/hr at 09/26/17 1015   . amoxicillin-clavulanate  500 mg of amoxicillin Oral Q12H  . feeding supplement (NEPRO CARB STEADY)  237 mL Oral BID BM  . insulin aspart  0-15 Units Subcutaneous Q6H  . LORazepam  0.5 mg Oral QHS  . multivitamin  1 tablet Oral QHS  . pantoprazole (PROTONIX) IV  40 mg Intravenous Q24H  . sodium polystyrene  15 g Oral Once  . sulfacetamide  1 drop Left Eye Q4H   ipratropium-albuterol, morphine injection, nitroGLYCERIN, [DISCONTINUED] ondansetron **OR** ondansetron (ZOFRAN) IV, polyethylene glycol  Assessment/ Plan:  [name] is a 76 y.o. white  female with hypertension, hyperlipidemia, Parkinson's, DVT, Diabetes mellitus type II, Depression, Coronary artery disease, COPD, Anxiety, Osteoarthritis   1.  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 GFR of 17-25. Creatinine does fluctuate with volume status.  Required hemodialysis in the past.  - Continue IV fluids for now. Monitor volume status closely.  - low potassium diet.   2.  Acute respiratory failure secondary to pneumonia. On 2 L [place] Oxygen. Afebrile, no leukocytosis - transitioned to Augmentin - Supportive care.   3. Hypertension with systolic congestive heart failure: EF of 30% on echocardiogram. Currently off diuretics. Blood pressure at goal.   4.  Diabetes mellitus type 2 with chronic kidney disease: Continue glucose control.    LOS: 2 Thayden Lemire 12/17/201810:55 AM

## 2017-09-27 NOTE — Evaluation (Signed)
Physical Therapy Evaluation Patient Details Name: Renella CunasJudy Galanti MRN: [id number] DOB: [birth year] Today's Date: 09/27/2017   History of Present Illness  presented to ER and admitted with respiratory failure related to aspiration PNA.  Currently on 2L supplemental O2 via [place]  Clinical Impression  Patient sleeping upon arrival to room, but awakens easily and expresses desire to sit edge of bed with therapist.  R UE and head tremulous due to Parkinsons.  Generally weak and deconditioned throughout all extremities, requiring mod/max assist for bed mobility and consistent min assist for unsupported sitting balance.  Fatigues quickly and requests therapist not attempt standing or OOB at this time. Sats maintained >94% on 2L throughout session; HR 110s-120s (elevates appropriately with transitional movements, but quickly returns to baseline). Would benefit from skilled PT to address above deficits and promote optimal return to PLOF; Recommend transition to HHPT with 24/7 care upon discharge from acute hospitalization.  Per notes, patient/family considering home with hospice care-will continue to follow for goals of care.   Follow Up Recommendations Home health PT;Supervision/Assistance - 24 hour(per chart, family considering transition to home with hospice)    Equipment Recommendations  Hospital bed    Recommendations for Other Services       Precautions / Restrictions Precautions Precautions: Fall Restrictions Weight Bearing Restrictions: No      Mobility  Bed Mobility Overal bed mobility: Needs Assistance Bed Mobility: Supine to Sit;Sit to Supine     Supine to sit: Mod assist Sit to supine: Max assist   General bed mobility comments: assist for truncal elevation, scooting towards edge of bed and unsupported sitting balance  Transfers                 General transfer comment: unsafe/unable  Ambulation/Gait             General Gait Details: unsafe/unable  Stairs             Wheelchair Mobility    Modified Rankin (Stroke Patients Only)       Balance Overall balance assessment: Needs assistance Sitting-balance support: No upper extremity supported;Feet supported Sitting balance-Leahy Scale: Fair Sitting balance - Comments: requires UE support, min assist from therapist to maintain Postural control: Posterior lean     Standing balance comment: unsafe/unable                             Pertinent Vitals/Pain Pain Assessment: No/denies pain    Home Living Family/patient expects to be discharged to:: Private residence Living Arrangements: Other relatives(grandson and his girlfriend) Available Help at Discharge: Family;Available 24 hours/day Type of Home: House Home Access: Stairs to enter Entrance Stairs-Rails: None Entrance Stairs-Number of Steps: 3 Home Layout: One level Home Equipment: Walker - 4 wheels;Bedside commode;Shower seat;Wheelchair - manual      Prior Function Level of Independence: Needs assistance         Comments: Chart endorses recent functional decline, using manual WC (pushed by family) as primary mobility; requiring assist from family for all transfers ("they lift me up") and ADLs.  Denies home O2.     Hand Dominance        Extremity/Trunk Assessment   Upper Extremity Assessment Upper Extremity Assessment: Generalized weakness(grossly 3/5, significant tremor R UE and head)    Lower Extremity Assessment Lower Extremity Assessment: Generalized weakness(grossly 3-/5 throughout, bilat ankles to neutral)       Communication   Communication: No difficulties  Cognition Arousal/Alertness: Awake/alert Behavior During  Therapy: WFL for tasks assessed/performed Overall Cognitive Status: Within Functional Limits for tasks assessed                                        General Comments      Exercises     Assessment/Plan    PT Assessment Patient needs continued PT services   PT Problem List Decreased strength;Decreased range of motion;Decreased activity tolerance;Decreased balance;Decreased mobility;Decreased coordination;Decreased cognition;Decreased knowledge of use of DME;Decreased safety awareness;Decreased knowledge of precautions;Cardiopulmonary status limiting activity       PT Treatment Interventions Gait training;Therapeutic exercise;DME instruction;Functional mobility training;Therapeutic activities;Balance training;Patient/family education    PT Goals (Current goals can be found in the Care Plan section)  Acute Rehab PT Goals Patient Stated Goal: to try to sit up PT Goal Formulation: With patient Time For Goal Achievement: 10/11/17 Potential to Achieve Goals: Fair    Frequency Min 2X/week   Barriers to discharge Decreased caregiver support      Co-evaluation               AM-PAC PT "6 Clicks" Daily Activity  Outcome Measure Difficulty turning over in bed (including adjusting bedclothes, sheets and blankets)?: Unable Difficulty moving from lying on back to sitting on the side of the bed? : Unable Difficulty sitting down on and standing up from a chair with arms (e.g., wheelchair, bedside commode, etc,.)?: Unable Help needed moving to and from a bed to chair (including a wheelchair)?: Total Help needed walking in hospital room?: Total Help needed climbing 3-5 steps with a railing? : Total 6 Click Score: 6    End of Session Equipment Utilized During Treatment: Oxygen Activity Tolerance: Patient limited by fatigue Patient left: in bed;with call bell/phone within reach;with bed alarm set Nurse Communication: Mobility status PT Visit Diagnosis: Muscle weakness (generalized) (M62.81);Difficulty in walking, not elsewhere classified (R26.2)    Time: 0942-1000 PT Time Calculation (min) (ACUTE ONLY): 18 min   Charges:   PT Evaluation $PT Eval Moderate Complexity: 1 Mod     PT G Codes:   PT G-Codes **NOT FOR INPATIENT  CLASS** Functional Assessment Tool Used: AM-PAC 6 Clicks Basic Mobility Functional Limitation: Mobility: Walking and moving around Mobility: Walking and Moving Around Current Status ([id number]([id number]): At least 80 percent but less than 100 percent impaired, limited or restricted Mobility: Walking and Moving Around Goal Status [phone](G8979): At least 20 percent but less than 40 percent impaired, limited or restricted    Jarrett Albor H. Manson PasseyBrown, PT, DPT, NCS 09/27/17, 10:14 AM [phone]

## 2017-09-27 NOTE — Progress Notes (Signed)
[facility] [place] [facility]   PATIENT NAME: Molly Wolf    MR#:  [id number]  DATE OF BIRTH:  Feb 06, 1941    CHIEF COMPLAINT:   Chief Complaint  Patient presents with  . Respiratory Arrest   Confused today.  On nasal cannula.  Poor oral intake.  REVIEW OF SYSTEMS:   Review of Systems  Unable to perform ROS: Mental status change   DRUG ALLERGIES:   Allergies  Allergen Reactions  . Prednisone Other (See Comments)    halluciantions    VITALS:  Blood pressure 114/84, pulse (!) 117, temperature 98.2 F (36.8 C), temperature source Oral, resp. rate 16, height 5\' 6"  (1.676 m), weight 59 kg (130 lb), SpO2 99 %.  PHYSICAL EXAMINATION:  GENERAL:  76 y.o.-year-old patient lying in the bed with no acute distress.  Patient has continued shaking of hands and head secondary to Parkinson disease. EYES: Pupils equal, round, reactive to light and accommodation. No scleral icterus. Extraocular muscles intact.  HEENT: Head atraumatic, normocephalic. Oropharynx and nasopharynx clear.  NECK:  Supple, no jugular venous distention. No thyroid enlargement, no tenderness.  LUNGS:  decreased breath sound bilaterally.Marland Kitchen.  CARDIOVASCULAR: S1, S2 normal. No murmurs, rubs, or gallops.  ABDOMEN: Soft, nontender, nondistended. Bowel sounds present. No organomegaly or mass.  EXTREMITIES: No pedal edema, cyanosis, or clubbing.  NEUROLOGIC:   decreased muscle strength overall but it is not new.   PSYCHIATRIC: The patient is drowsy SKIN: No obvious rash, lesion, or ulcer.   LABORATORY PANEL:   CBC Recent Labs  Lab 09/27/17 0439  WBC 5.6  HGB 11.8*  HCT 38.3  PLT 105*   ------------------------------------------------------------------------------------------------------------------  Chemistries  Recent Labs  Lab 09/25/17 1545 09/25/17 2133  09/27/17 0439  NA 137 139   < > 139  K 5.2* 4.5   < > 5.2*  CL 110 111   < > 113*  CO2 22 21*   < > 20*  GLUCOSE 343*  346*   < > 195*  BUN 50* 50*   < > 52*  CREATININE 2.06* 1.94*   < > 2.39*  CALCIUM 8.8* 8.0*   < > 8.2*  MG  --  1.7  --   --   AST 19  --   --   --   ALT <5*  --   --   --   ALKPHOS 63  --   --   --   BILITOT 1.0  --   --   --    < > = values in this interval not displayed.   ------------------------------------------------------------------------------------------------------------------  Cardiac Enzymes Recent Labs  Lab 09/26/17 0212  TROPONINI <0.03   ------------------------------------------------------------------------------------------------------------------  RADIOLOGY:  Dg Chest Port 1 View  Result Date: 09/25/2017 CLINICAL DATA:  Witnessed aspiration. EXAM: PORTABLE CHEST 1 VIEW COMPARISON:  08/07/2017 FINDINGS: Enlarged cardiac silhouette. Calcific atherosclerotic disease of the aorta and tortuosity. Left lower lobe, left middle lobe airspace consolidation. Moderate left pleural effusion. Osseous structures are without acute abnormality. Soft tissues are grossly normal. IMPRESSION: Left lower and middle lobes airspace consolidation. Moderate left pleural effusion. Electronically Signed   By: Ted Mcalpineobrinka  Dimitrova M.D.   On: 09/25/2017 16:39    EKG:   Orders placed or performed during the hospital encounter of 09/25/17  . ED EKG  . ED EKG  . EKG 12-Lead  . EKG 12-Lead    ASSESSMENT AND PLAN:  *Acute hypoxic and hypercarbic respiratory failure.  Was on BiPAP.  Now  on nasal cannula. Wean off oxygen as tolerated.  Nebulizers.  *Left-sided aspiration pneumonia.  Will start Augmentin orally.  * History of CKD stage IV On gentle IV hydration with history of CHF.  *  Chronic systolic heart failure with EF 35%.  * severe Parkinson disease.  *Diabetes mellitus type 2.  Sliding scale insulin.  Likely discharge tomorrow home with hospice.  Poor prognosis.  All the records are reviewed and case discussed with Care Management/Social Worker Management plans  discussed with the patient, family and they are in agreement.  CODE STATUS: DNR since his admission.  TOTAL TIME TAKING CARE OF THIS PATIENT: 35 minutes.   POSSIBLE D/C IN 2-3DAYS, DEPENDING ON CLINICAL CONDITION.   Molinda BailiffSrikar R Katryna Tschirhart M.D on 09/27/2017 at 3:49 PM  Between 7am to 6pm - Pager - [phone]  After 6pm go to [email or web address] - password [email or web address] [facility]  [facility]  Office  [phone]  CC: Primary care physician; Lauro RegulusAnderson, Marshall W, MD   Note: This dictation was prepared with Dragon dictation along with smaller phrase technology. Any transcriptional errors that result from this process are unintentional.

## 2017-09-27 NOTE — Progress Notes (Signed)
No charge note:   Palliative consult received.   Patient's daughter unable to meet today. Meeting scheduled for 12/18 at 0900.  Ocie BobKasie Mahan, AGNP-C Palliative Medicine  Please call Palliative Medicine team phone with any questions [phone]. For individual providers please see AMION.

## 2017-09-28 ENCOUNTER — Other Ambulatory Visit: Payer: Self-pay

## 2017-09-28 DIAGNOSIS — Z515 Encounter for palliative care: Secondary | ICD-10-CM

## 2017-09-28 DIAGNOSIS — J690 Pneumonitis due to inhalation of food and vomit: Secondary | ICD-10-CM

## 2017-09-28 DIAGNOSIS — G20 Parkinson's disease: Secondary | ICD-10-CM

## 2017-09-28 DIAGNOSIS — Z7189 Other specified counseling: Secondary | ICD-10-CM

## 2017-09-28 DIAGNOSIS — G20A1 Parkinson's disease without dyskinesia, without mention of fluctuations: Secondary | ICD-10-CM

## 2017-09-28 LAB — BASIC METABOLIC PANEL
ANION GAP: 7 (ref 5–15)
BUN: 55 mg/dL — ABNORMAL HIGH (ref 6–20)
CALCIUM: 8.3 mg/dL — AB (ref 8.9–10.3)
CO2: 20 mmol/L — AB (ref 22–32)
Chloride: 113 mmol/L — ABNORMAL HIGH (ref 101–111)
Creatinine, Ser: 2.58 mg/dL — ABNORMAL HIGH (ref 0.44–1.00)
GFR calc Af Amer: 20 mL/min — ABNORMAL LOW (ref >60)
GFR calc non Af Amer: 17 mL/min — ABNORMAL LOW (ref >60)
GLUCOSE: 115 mg/dL — AB (ref 65–99)
Potassium: 5 mmol/L (ref 3.5–5.1)
Sodium: 140 mmol/L (ref 135–145)

## 2017-09-28 LAB — GLUCOSE, CAPILLARY
GLUCOSE-CAPILLARY: 109 mg/dL — AB (ref 65–99)
GLUCOSE-CAPILLARY: 155 mg/dL — AB (ref 65–99)
GLUCOSE-CAPILLARY: 207 mg/dL — AB (ref 65–99)
Glucose-Capillary: 110 mg/dL — ABNORMAL HIGH (ref 65–99)

## 2017-09-28 LAB — PHOSPHORUS: Phosphorus: 4.3 mg/dL (ref 2.5–4.6)

## 2017-09-28 LAB — MAGNESIUM: Magnesium: 1.8 mg/dL (ref 1.7–2.4)

## 2017-09-28 NOTE — Consult Note (Signed)
Consultation Note Date: 09/28/2017   Patient Name: Molly Wolf  DOB: 1941-07-15  MRN: [id number]  Age / Sex: 76 y.o., female  PCP: Kirk Ruths, MD Referring Physician: Hillary Bow, MD  Reason for Consultation: Establishing goals of care  HPI/Patient Profile: 76 y.o. female  with past medical history of Parkinson's dementia, CKD IV, CHF (35%), COPD, A.Fib, depression admitted on 09/25/2017 with respiratory distress post aspiration event. During admission she required Bipap, but vomited into the bipap. She was made DNR per attending MD.  Decision was made to proceed with IV antibiotics and fluids over the weekend and then readdress [place]. Palliative medicine consulted for continued GOC.   Clinical Assessment and Goals of Care:  I have reviewed medical records including EPIC notes, labs and imaging, assessed the patient and then met at the bedside along with patient's daughter, [name], to discuss diagnosis prognosis, [place], EOL wishes, disposition and options.  I introduced Palliative Medicine as specialized medical care for people living with serious illness. It focuses on providing relief from the symptoms and stress of a serious illness. The goal is to improve quality of life for both the patient and the family.  We discussed a brief life review of the patient. She was living with grandson- [name], who is HCPOA. She grew up [facility], but is now a member of the [employer].  As far as functional and nutritional status there has been a noted decline since she broke her leg in March. She has been wheelchair bound. She has developed significant dysphagia. Chart review shows weight down 10% since July (144lbs , 130lbs).    We discussed their current illness and what it means in the larger context of their on-going co-morbidities.  Natural disease trajectory and expectations at EOL were discussed.  [name] tells me this is not a new conversation. They are aware patient has been on a downward trajectory for quite some time. We discussed the likelihood that aspiration pneumonia will occur again, and the decisions will need to be made on how to proceed in the future. We discussed renal function decline and history of need for dialysis- patient would not want dialysis again.  I attempted to elicit values and goals of care important to the patient. Patient tells me that spending time with her Grandchildren and her dog, Gwinda Passe is important to her. She enjoys country music. Music therapy was provided- we listened to [place] Pardon's "Jolene". The patient was noted to close her eyes and relax during music therapy and vebalized positive outcome.   The difference between aggressive medical intervention and comfort care was considered in light of the patient's goals of care. Plan to continue antibiotics and maximize medically without other aggressive interventions for now in hopes of discharging home, likely with Hospice support. [name] needs to discuss with Loura Halt.   Advanced directives, concepts specific to code status, artifical feeding and hydration, and rehospitalization were considered and discussed. Patient is DNR. Would not want artificial feeding.   Hospice and Palliative Care  services outpatient were explained and offered.  Questions and concerns were addressed.  The family was encouraged to call with questions or concerns.   Primary Decision Maker HCPOA- Cyril Loosen    SUMMARY OF RECOMMENDATIONS -Continue current care -No dialysis -Possible d/c with Hospice -Family requests followup meeting with PMT tomorrow morning at [place]:  DNR  Palliative Prophylaxis:   Aspiration  Additional Recommendations (Limitations, Scope, Preferences):  No Hemodialysis  Prognosis:    < 6 months  Discharge Planning: To Be Determined - recommend home with Hospice  due to dysphagia  Primary Diagnoses: Present on Admission: . Acute respiratory failure ([place])   I have reviewed the medical record, interviewed the patient and family, and examined the patient. The following aspects are pertinent.  Past Medical History:  Diagnosis Date  . Anxiety   . Arthritis   . Chronic combined systolic and diastolic CHF (congestive heart failure) ([place])    a. 01/2008 Echo ([facility]): EF > 55%, mod-sev LVH w/ grade 1 DD, biatrial enlargement, mild AS, trace MR/TR; b. EF 45-50%, GR2DD, mild to moderate aortic stenosis, mild aortic insufficieny, mild to moderate mitral regurgitation, mild tricuspid regurgitation, mild biatrial enlargement, and a small pericardial effusion  . CKD (chronic kidney disease), stage IV ([place])   . Closed patellar sleeve fracture of right knee    a. 01/2017 - conservatively managed.  Marland Kitchen COPD (chronic obstructive pulmonary disease) ([place])   . Coronary artery disease    a. s/p remote stenting of unknown vessel @ [facility].  . Depression   . Diabetes mellitus with complication ([place])   . DVT (deep venous thrombosis) ([facility])    a. in the setting of right patellar fracture on Coumadin  . Hyperlipidemia   . Hypertension   . Parkinson disease ([place])   . Renal insufficiency    Social History   Socioeconomic History  . Marital status: Single    Spouse name: Not on file  . Number of children: Not on file  . Years of education: Not on file  . Highest education level: Not on file  Social Needs  . Financial resource strain: Not on file  . Food insecurity - worry: Not on file  . Food insecurity - inability: Not on file  . Transportation needs - medical: Not on file  . Transportation needs - non-medical: Not on file  Occupational History  . Not on file  Tobacco Use  . Smoking status: Never Smoker  . Smokeless tobacco: Never Used  Substance and Sexual Activity  . Alcohol use: No  . Drug use: No  . Sexual activity: No  Other Topics Concern  . Not on file    Social History Narrative   Lives at home with grandson. Currently non-ambulatory and mostly bed bound at baseline.   Family History  Problem Relation Age of Onset  . Cervical cancer Mother   . Kidney failure Father   . CAD Father   . CAD Brother   . Prostate cancer Neg Hx   . Kidney cancer Neg Hx   . Bladder Cancer Neg Hx    Scheduled Meds: . amoxicillin-clavulanate  500 mg of amoxicillin Oral Q12H  . feeding supplement (NEPRO CARB STEADY)  237 mL Oral BID BM  . insulin aspart  0-15 Units Subcutaneous Q6H  . LORazepam  0.5 mg Oral QHS  . multivitamin  1 tablet Oral QHS  . multivitamin with minerals  1 tablet Oral Once per day on Mon Thu  .  pantoprazole (PROTONIX) IV  40 mg Intravenous Q24H  . sulfacetamide  1 drop Left Eye Q4H  . vitamin C  500 mg Oral BID   Continuous Infusions: PRN Meds:.ipratropium-albuterol, morphine injection, nitroGLYCERIN, [DISCONTINUED] ondansetron **OR** ondansetron (ZOFRAN) IV, polyethylene glycol Medications Prior to Admission:  Prior to Admission medications   Medication Sig Start Date End Date Taking? Authorizing Provider  acetaminophen (TYLENOL) 325 MG tablet Take 650 mg by mouth daily.    Yes [provider]  atorvastatin (LIPITOR) 20 MG tablet Take 1 tablet (20 mg total) by mouth at bedtime. 08/09/17  Yes Hackney, Otila Kluver A, FNP  Carbidopa-Levodopa ER (SINEMET CR) 25-100 MG tablet controlled release Take 2 tablets by mouth 4 (four) times daily. Patient taking differently: Take 2 tablets by mouth 3 (three) times daily.  03/05/17  Yes Hackney, Tina A, FNP  Coenzyme Q10 (COQ10) 50 MG CAPS Take 50 mg by mouth daily.   Yes [provider]  Cranberry 450 MG TABS Take 450 mg by mouth daily.   Yes [provider]  docusate sodium (COLACE) 100 MG capsule Take 1 capsule (100 mg total) by mouth 2 (two) times daily. Patient taking differently: Take 100 mg by mouth every other day.  04/27/17  Yes Dustin Flock, MD  escitalopram  (LEXAPRO) 20 MG tablet Take 20 mg by mouth daily.   Yes [provider]  loratadine (CLARITIN) 10 MG tablet Take 5 mg by mouth daily as needed for allergies.    Yes [provider]  LORazepam (ATIVAN) 0.5 MG tablet Take 0.5 mg by mouth at bedtime.    Yes [provider]  mirabegron ER (MYRBETRIQ) 25 MG TB24 tablet Take 25 mg by mouth daily.   Yes [provider]  multivitamin (RENA-VIT) TABS tablet Take 1 tablet by mouth at bedtime. 08/08/17  Yes Vaughan Basta, MD  nitroGLYCERIN (NITROSTAT) 0.4 MG SL tablet Place 0.4 mg under the tongue every 5 (five) minutes as needed for chest pain.   Yes [provider]  Nutritional Supplements (FEEDING SUPPLEMENT, NEPRO CARB STEADY,) LIQD Take 237 mLs by mouth 3 (three) times daily between meals. 04/27/17  Yes Dustin Flock, MD  Omega-3 Fatty Acids (FISH OIL) 1000 MG CAPS Take 1,000 mg by mouth daily.   Yes [provider]  ondansetron (ZOFRAN ODT) 4 MG disintegrating tablet Take 1 tablet (4 mg total) by mouth every 8 (eight) hours as needed for nausea or vomiting. 07/14/17  Yes Earleen Newport, MD  polyethylene glycol (MIRALAX / GLYCOLAX) packet Take 17 g by mouth daily as needed for mild constipation.   Yes [provider]  promethazine (PHENERGAN) 25 MG tablet Take 25 mg by mouth every 6 (six) hours as needed for nausea or vomiting.   Yes [provider]  saccharomyces boulardii (FLORASTOR) 250 MG capsule Take 250 mg by mouth daily.   Yes [provider]  traMADol (ULTRAM) 50 MG tablet Take 1 tablet (50 mg total) by mouth daily as needed. Patient taking differently: Take 50 mg by mouth daily as needed for moderate pain.  04/27/17  Yes Dustin Flock, MD  aspirin EC 81 MG tablet Take 1 tablet (81 mg total) by mouth daily. Patient taking differently: Take 81 mg by mouth every other day.  05/20/17   End, Harrell Gave, MD  gabapentin (NEURONTIN) 300 MG capsule Take 1  capsule (300 mg total) by mouth 2 (two) times daily. Patient not taking: Reported on 09/25/2017 07/15/17   Alisa Graff, FNP  Allergies  Allergen Reactions  . Prednisone Other (See Comments)    halluciantions   Review of Systems  Respiratory: Negative for shortness of breath.   Psychiatric/Behavioral: Negative for sleep disturbance. The patient is not nervous/anxious.     Physical Exam  Constitutional: She is oriented to person, place, and time.  frail  Pulmonary/Chest:  Moan with each exhalation  Neurological: She is oriented to person, place, and time.  Constant head tremor noted  Psychiatric: She has a normal mood and affect.  Nursing note and vitals reviewed.   Vital Signs: BP 128/75 (BP Location: Left Arm)   Pulse (!) 119   Temp 98.6 F (37 C) (Oral)   Resp 16   Ht _0  (1.676 m)   Wt 59 kg (130 lb)   SpO2 98%   BMI 20.98 kg/m  Pain Assessment: No/denies pain       SpO2: SpO2: 98 % O2 Device:SpO2: 98 % O2 Flow Rate: .O2 Flow Rate (L/min): 2 L/min  IO: Intake/output summary:   Intake/Output Summary (Last 24 hours) at 09/28/2017 0950 Last data filed at 09/28/2017 0800 Gross per 24 hour  Intake 0 ml  Output 425 ml  Net -425 ml    LBM: Last BM Date: (unsure) Baseline Weight: Weight: 59 kg (130 lb) Most recent weight: Weight: 59 kg (130 lb)     Palliative Assessment/Data:40%     Thank you for this consult. Palliative medicine will continue to follow and assist as needed.   Time In: 0830 Time Out: 1020 Time Total: 110 Prolonged services billed: Yes Greater than 50%  of this time was spent counseling and coordinating care related to the above assessment and plan.  Signed by: Mariana Kaufman, AGNP-C Palliative Medicine    Please contact Palliative Medicine Team phone at [phone] for questions and concerns.  For individual provider: See Shea Evans

## 2017-09-28 NOTE — Plan of Care (Signed)
Pt alertness improving. Eating significantly more food.

## 2017-09-28 NOTE — Progress Notes (Signed)
No charge note.  Followup meeting tomorrow with patient's HCPOA rescheduled to 11am.  Ocie BobKasie Brylinn Teaney, AGNP-C Palliative Medicine  Please call Palliative Medicine team phone with any questions [phone]. For individual providers please see AMION.

## 2017-09-28 NOTE — Progress Notes (Signed)
PT Cancellation Note  Patient Details Name: Renella CunasJudy Craigie MRN: [id number] DOB: 1941-07-23   Cancelled Treatment:    Reason Eval/Treat Not Completed: Patient declined, no reason specified. Treatment attempted; pt declined noting she "just doesn't feel well; I can't do anything right now". Re attempt at a later date as the schedule allows.    Scot DockHeidi E Chey Rachels, PTA 09/28/2017, 4:15 PM

## 2017-09-28 NOTE — Progress Notes (Addendum)
Speech Language Pathology Treatment: Dysphagia  Patient Details Name: Molly Wolf MRN: [id number] DOB: Jan 06, 1941 Today's Date: 09/28/2017 Time: 1205-1250 SLP Time Calculation (min) (ACUTE ONLY): 45 min  Assessment / Plan / Recommendation Clinical Impression  Pt seen today for ongoing assessment of toleration of diet and education w/ Daughter present today. Pt consumed few trials of puree and thin liquids w/ no overt s/s of aspiration noted toward end of session. For this session, education and discussion w/ Daughter was had d/t being able to meet w/ SLP. Discussed aspiration precautions; support at meals to facilitate eating/drinking; food prep and options d/t ill-fitting dentures and no lower molars for mastication; dysphagia and impact of Neurological dis/Parkinson's Dis. on swallowing overall. Handouts and information given to Daughter on diet consistency and preparation of the foods to a puree consistency; use of condiments and soups to moisten foods well for chewing. Recommend f/u w/ Dietician for supplements to aid nutrition. Recommend Pills in Puree for easier, safer swallowing. Recommend feeding assistance at meals - monitor for fatigue and pace pt offering smaller, frequent meals. The diet consistency recommended would be most beneficial for pt's overall (safer) intake and conservation of energy at this time. ST services can be reconsulted if indicated while admitted. Daughter agreed; NSG updated and agreed. Palliative Care following now for goals of care tomorrow.      HPI HPI: Pt is a 76 y.o. female with a known history of CK D stage IV who has recently come off of temporary dialysis that was initiated few months ago, COPD not on home oxygen, coronary artery disease, diabetes mellitus, atrial fibrillation not on anticoagulation due to risk of falls, combined congestive heart failure, hypertension, Parkinson's disease presents to the hospital secondary to acute respiratory distress.  Patient at baseline is non-ambulated lead lately using a wheelchair. Lives at home with her grandson. She has some trouble swallowing, mostly slow swallowing. Family denies any history of overt aspiration episodes but some difficulty chewing the foods. She has been lately having trouble breathing when was started on inhalers. This afternoon patient had still food in her mouth while she tried to use her Spiriva inhaler and had a possible aspiration episode with hypoxia, cyanosis and difficulty breathing. EMS was called and patient was brought to the emergency room. Initiation of BiPAP was attempted however patient had another vomiting episode here. Initial ABG shows respiratory acidosis with pH of 7.17. Patient denies any nausea now. No fevers or chills. Heart rate is slightly elevated and respiratory rate is going up to the 30s with minimal exertion. Patient is being admitted to stepdown unit. In the ED while being treated for respiratory distress, Patient started to throw up while on bipap, mask removed immediately and mouth suctioned with yankauer. Patient then placed on 4l [place], with sats 90-91%. Pt is currently on a dysphagia level 2 diet per MD.      SLP Plan  All goals met       Recommendations  Diet recommendations: Dysphagia 1 (puree);Thin liquid Liquids provided via: Cup;Straw Medication Administration: Whole meds with puree(but Crushed if needed for easier,safer swallowing) Supervision: Patient able to self feed;Staff to assist with self feeding;Full supervision/cueing for compensatory strategies Compensations: Minimize environmental distractions;Slow rate;Small sips/bites;Lingual sweep for clearance of pocketing;Multiple dry swallows after each bite/sip;Follow solids with liquid Postural Changes and/or Swallow Maneuvers: Seated upright 90 degrees;Upright 30-60 min after meal                General recommendations: (Dietician f/u for supplements) Oral  Care Recommendations: Oral care  BID;Staff/trained caregiver to provide oral care Follow up Recommendations: Skilled Nursing facility SLP Visit Diagnosis: Dysphagia, oropharyngeal phase (R13.12) Plan: All goals met       GO                Orinda Kenner, MS, CCC-SLP Watson,Katherine 09/28/2017, 5:22 PM

## 2017-09-28 NOTE — Progress Notes (Signed)
[facility]  ROUNDING NOTE   Subjective:   Family at bedside.  Patient alert to self and place this morning  Subjective chills and fevers. Afebrile.   Creatinine 2.58 (2.39)  Objective:  Vital signs in last 24 hours:  Temp:  [98.2 F (36.8 C)-98.6 F (37 C)] 98.6 F (37 C) (12/18 0508) Pulse Rate:  [117-119] 119 (12/18 0508) Resp:  [16-18] 16 (12/18 0508) BP: (110-128)/(73-84) 128/75 (12/18 0508) SpO2:  [98 %-100 %] 98 % (12/18 0508)  Weight change:  Filed Weights   09/25/17 1549  Weight: 59 kg (130 lb)    Intake/Output: I/O last 3 completed shifts: In: 747.3 [I.V.:697.3; IV Piggyback:50] Out: 325 [Urine:325]   Intake/Output this shift:  Total I/O In: 360 [P.O.:360] Out: 200 [Urine:200]  Physical Exam: General: NAD, laying in bed  Head: Normocephalic, atraumatic. Moist oral mucosal membranes  Eyes: Anicteric, PERRL  Neck: Supple, trachea midline  Lungs:  Clear to auscultation  Heart: irregular  Abdomen:  Soft, nontender  Extremities: no peripheral edema.  Neurologic: Alert to self only, +tremor  Skin: No lesions  Access: none    Basic Metabolic Panel: Recent Labs  Lab 09/25/17 1545 09/25/17 2133 09/26/17 0212 09/27/17 0439 09/28/17 0429  NA 137 139 140 139 140  K 5.2* 4.5 4.8 5.2* 5.0  CL 110 111 113* 113* 113*  CO2 22 21* 20* 20* 20*  GLUCOSE 343* 346* 207* 195* 115*  BUN 50* 50* 47* 52* 55*  CREATININE 2.06* 1.94* 1.96* 2.39* 2.58*  CALCIUM 8.8* 8.0* 8.1* 8.2* 8.3*  MG  --  1.7  --   --  1.8  PHOS  --  4.4  --   --  4.3    Liver Function Tests: Recent Labs  Lab 09/25/17 1545  AST 19  ALT <5*  ALKPHOS 63  BILITOT 1.0  PROT 6.2*  ALBUMIN 3.3*   No results for input(s): LIPASE, AMYLASE in the last 168 hours. No results for input(s): AMMONIA in the last 168 hours.  CBC: Recent Labs  Lab 09/25/17 1545 09/25/17 2133 09/26/17 0212 09/27/17 0439  WBC 9.4  --  7.2 5.6  NEUTROABS 7.2*  --   --   --   HGB 13.1 12.4  12.2 11.8*  HCT 43.0 40.7 39.6 38.3  MCV 103.0*  --  103.0* 103.0*  PLT 169  --  106* 105*    Cardiac Enzymes: Recent Labs  Lab 09/25/17 1545 09/25/17 2133 09/26/17 0212  TROPONINI <0.03 <0.03 <0.03    BNP: Invalid input(s): POCBNP  CBG: Recent Labs  Lab 09/27/17 0424 09/27/17 1142 09/27/17 1833 09/28/17 0011 09/28/17 0631  GLUCAP 195* 92 117* 109* 110*    Microbiology: Results for orders placed or performed during the hospital encounter of 09/25/17  Culture, blood (routine x 2)     Status: None (Preliminary result)   Collection Time: 09/25/17  3:45 PM  Result Value Ref Range Status   Specimen Description BLOOD RIGHT ANTECUBITAL  Final   Special Requests   Final    BOTTLES DRAWN AEROBIC AND ANAEROBIC Blood Culture adequate volume   Culture NO GROWTH 3 DAYS  Final   Report Status PENDING  Incomplete  Culture, blood (routine x 2)     Status: None (Preliminary result)   Collection Time: 09/25/17  4:03 PM  Result Value Ref Range Status   Specimen Description BLOOD BLOOD LEFT FOREARM  Final   Special Requests   Final    BOTTLES DRAWN AEROBIC AND ANAEROBIC  Blood Culture results may not be optimal due to an inadequate volume of blood received in culture bottles   Culture NO GROWTH 3 DAYS  Final   Report Status PENDING  Incomplete  CULTURE, BLOOD (ROUTINE X 2) w Reflex to ID Panel     Status: None (Preliminary result)   Collection Time: 09/25/17  7:26 PM  Result Value Ref Range Status   Specimen Description BLOOD RIGHT HAND  Final   Special Requests   Final    BOTTLES DRAWN AEROBIC AND ANAEROBIC Blood Culture adequate volume   Culture NO GROWTH 3 DAYS  Final   Report Status PENDING  Incomplete  CULTURE, BLOOD (ROUTINE X 2) w Reflex to ID Panel     Status: None (Preliminary result)   Collection Time: 09/25/17  7:39 PM  Result Value Ref Range Status   Specimen Description BLOOD LEFT ANTECUBITAL  Final   Special Requests   Final    BOTTLES DRAWN AEROBIC AND ANAEROBIC  Blood Culture adequate volume   Culture NO GROWTH 3 DAYS  Final   Report Status PENDING  Incomplete  Urine culture     Status: None   Collection Time: 09/25/17  7:41 PM  Result Value Ref Range Status   Specimen Description URINE, RANDOM  Final   Special Requests NONE  Final   Culture   Final    NO GROWTH Performed at [facility], [address] [place], [place] [address]    Report Status 09/27/2017 FINAL  Final  MRSA PCR Screening     Status: None   Collection Time: 09/25/17  7:41 PM  Result Value Ref Range Status   MRSA by PCR NEGATIVE NEGATIVE Final    Comment:        The GeneXpert MRSA Assay (FDA approved for NASAL specimens only), is one component of a comprehensive MRSA colonization surveillance program. It is not intended to diagnose MRSA infection nor to guide or monitor treatment for MRSA infections.   Gastrointestinal Panel by PCR , Stool     Status: None   Collection Time: 09/25/17  7:41 PM  Result Value Ref Range Status   Campylobacter species NOT DETECTED NOT DETECTED Final   Plesimonas shigelloides NOT DETECTED NOT DETECTED Final   Salmonella species NOT DETECTED NOT DETECTED Final   Yersinia enterocolitica NOT DETECTED NOT DETECTED Final   Vibrio species NOT DETECTED NOT DETECTED Final   Vibrio cholerae NOT DETECTED NOT DETECTED Final   Enteroaggregative E coli (EAEC) NOT DETECTED NOT DETECTED Final   Enteropathogenic E coli (EPEC) NOT DETECTED NOT DETECTED Final   Enterotoxigenic E coli (ETEC) NOT DETECTED NOT DETECTED Final   Shiga like toxin producing E coli (STEC) NOT DETECTED NOT DETECTED Final   Shigella/Enteroinvasive E coli (EIEC) NOT DETECTED NOT DETECTED Final   Cryptosporidium NOT DETECTED NOT DETECTED Final   Cyclospora cayetanensis NOT DETECTED NOT DETECTED Final   Entamoeba histolytica NOT DETECTED NOT DETECTED Final   Giardia lamblia NOT DETECTED NOT DETECTED Final   Adenovirus F40/41 NOT DETECTED NOT DETECTED Final   Astrovirus NOT  DETECTED NOT DETECTED Final   Norovirus GI/GII NOT DETECTED NOT DETECTED Final   Rotavirus A NOT DETECTED NOT DETECTED Final   Sapovirus (I, II, IV, and V) NOT DETECTED NOT DETECTED Final  C difficile quick scan w PCR reflex     Status: None   Collection Time: 09/25/17  7:41 PM  Result Value Ref Range Status   C Diff antigen NEGATIVE NEGATIVE Final  C Diff toxin NEGATIVE NEGATIVE Final   C Diff interpretation No C. difficile detected.  Final    Coagulation Studies: Recent Labs    09/25/17 1545  LABPROT 15.0  INR 1.19    Urinalysis: Recent Labs    09/25/17 1941  COLORURINE AMBER*  LABSPEC 1.019  PHURINE 5.0  GLUCOSEU 50*  HGBUR NEGATIVE  BILIRUBINUR NEGATIVE  KETONESUR 5*  PROTEINUR 100*  NITRITE NEGATIVE  LEUKOCYTESUR NEGATIVE      Imaging: No results found.   Medications:    . amoxicillin-clavulanate  500 mg of amoxicillin Oral Q12H  . feeding supplement (NEPRO CARB STEADY)  237 mL Oral BID BM  . insulin aspart  0-15 Units Subcutaneous Q6H  . LORazepam  0.5 mg Oral QHS  . multivitamin  1 tablet Oral QHS  . multivitamin with minerals  1 tablet Oral Once per day on Mon Thu  . pantoprazole (PROTONIX) IV  40 mg Intravenous Q24H  . sulfacetamide  1 drop Left Eye Q4H  . vitamin C  500 mg Oral BID   ipratropium-albuterol, morphine injection, nitroGLYCERIN, [DISCONTINUED] ondansetron **OR** ondansetron (ZOFRAN) IV, polyethylene glycol  Assessment/ Plan:  [name] is a 76 y.o. white female with hypertension, hyperlipidemia, Parkinson's, DVT, Diabetes mellitus type II, Depression, Coronary artery disease, COPD, Anxiety, Osteoarthritis   1.  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 GFR of 17-25. Creatinine does fluctuate with volume status.  Required hemodialysis in the past.  Monitor volume status closely.  - low potassium diet.   2.  Acute respiratory failure secondary to  pneumonia. On 2 L [place] Oxygen. Afebrile, no leukocytosis - transitioned to PO Augmentin - Supportive care.   3. Hypertension with systolic congestive heart failure: EF of 30% on echocardiogram. Currently off diuretics. Blood pressure at goal.   4.  Diabetes mellitus type 2 with chronic kidney disease: Continue glucose control.   Appreciate Palliative care input.    LOS: 3 Latish Toutant 12/18/201810:50 AM

## 2017-09-28 NOTE — Care Management Important Message (Signed)
Important Message  Patient Details  Name: Renella CunasJudy Gitto MRN: [id number] Date of Birth: 1941-05-26   Medicare Important Message Given:  Yes    Chapman FitchBOWEN, Silvino Selman T, RN 09/28/2017, 12:07 PM

## 2017-09-28 NOTE — Progress Notes (Signed)
[facility] [place] [facility]   PATIENT NAME: Molly Wolf    MR#:  [id number]  DATE OF BIRTH:  1941-06-10    CHIEF COMPLAINT:   Chief Complaint  Patient presents with  . Respiratory Arrest   More awake. Grand daughter at bedside. Still on O2 Afebrile  REVIEW OF SYSTEMS:   Review of Systems  Unable to perform ROS: Mental status change   DRUG ALLERGIES:   Allergies  Allergen Reactions  . Prednisone Other (See Comments)    halluciantions    VITALS:  Blood pressure 139/90, pulse (!) 121, temperature 97.9 F (36.6 C), temperature source Oral, resp. rate (!) 24, height 5\' 6"  (1.676 m), weight 59 kg (130 lb), SpO2 98 %.  PHYSICAL EXAMINATION:  GENERAL:  76 y.o.-year-old patient lying in the bed with no acute distress.  Patient has continued shaking of hands and head secondary to Parkinson disease. EYES: Pupils equal, round, reactive to light and accommodation. No scleral icterus. Extraocular muscles intact.  HEENT: Head atraumatic, normocephalic. Oropharynx and nasopharynx clear.  NECK:  Supple, no jugular venous distention. No thyroid enlargement, no tenderness.  LUNGS: Decreased breath sound bilaterally.  CARDIOVASCULAR: S1, S2 normal. No murmurs, rubs, or gallops.  ABDOMEN: Soft, nontender, nondistended. Bowel sounds present. No organomegaly or mass.  EXTREMITIES: No pedal edema, cyanosis, or clubbing.  NEUROLOGIC:   decreased muscle strength overall but it is not new.   PSYCHIATRIC: The patient is awake and alert. SKIN: No obvious rash, lesion, or ulcer.   LABORATORY PANEL:   CBC Recent Labs  Lab 09/27/17 0439  WBC 5.6  HGB 11.8*  HCT 38.3  PLT 105*   ------------------------------------------------------------------------------------------------------------------  Chemistries  Recent Labs  Lab 09/25/17 1545  09/28/17 0429  NA 137   < > 140  K 5.2*   < > 5.0  CL 110   < > 113*  CO2 22   < > 20*  GLUCOSE 343*   < > 115*   BUN 50*   < > 55*  CREATININE 2.06*   < > 2.58*  CALCIUM 8.8*   < > 8.3*  MG  --    < > 1.8  AST 19  --   --   ALT <5*  --   --   ALKPHOS 63  --   --   BILITOT 1.0  --   --    < > = values in this interval not displayed.   ------------------------------------------------------------------------------------------------------------------  Cardiac Enzymes Recent Labs  Lab 09/26/17 0212  TROPONINI <0.03   ------------------------------------------------------------------------------------------------------------------  RADIOLOGY:  No results found.  EKG:   Orders placed or performed during the hospital encounter of 09/25/17  . ED EKG  . ED EKG  . EKG 12-Lead  . EKG 12-Lead    ASSESSMENT AND PLAN:  *Acute hypoxic and hypercarbic respiratory failure.  Was on BiPAP.  Now on nasal cannula. Wean off oxygen as tolerated.  Nebulizers.  * Left-sided aspiration pneumonia. On augmentin.  * AKI over CKD stage IV - worsening On gentle IV hydration with history of CHF Was recently on HD temporarily  * Chronic systolic heart failure with EF 35% No sign of fluid overload  * Severe Parkinson disease  * Diabetes mellitus type 2. Sliding scale insulin  Patient doesn't want HD  All the records are reviewed and case discussed with Care Management/Social Worker Management plans discussed with the patient, family and they are in agreement.  CODE STATUS: DNR  TOTAL TIME  TAKING CARE OF THIS PATIENT: 35 minutes.   POSSIBLE D/C IN 1-2 DAYS, DEPENDING ON CLINICAL CONDITION.  Molinda BailiffSrikar R Aniesha Haughn M.D on 09/28/2017 at 3:45 PM  Between 7am to 6pm - Pager - [phone]  After 6pm go to [email or web address] - password [email or web address] [facility]  [facility]  Office  [phone]  CC: Primary care physician; Lauro RegulusAnderson, Marshall W, MD   Note: This dictation was prepared with Dragon dictation along with smaller phrase technology. Any transcriptional errors that result from this process are  unintentional.

## 2017-09-29 LAB — BASIC METABOLIC PANEL
Anion gap: 7 (ref 5–15)
BUN: 60 mg/dL — AB (ref 6–20)
CHLORIDE: 112 mmol/L — AB (ref 101–111)
CO2: 19 mmol/L — AB (ref 22–32)
Calcium: 8.6 mg/dL — ABNORMAL LOW (ref 8.9–10.3)
Creatinine, Ser: 2.73 mg/dL — ABNORMAL HIGH (ref 0.44–1.00)
GFR calc Af Amer: 18 mL/min — ABNORMAL LOW (ref >60)
GFR calc non Af Amer: 16 mL/min — ABNORMAL LOW (ref >60)
GLUCOSE: 170 mg/dL — AB (ref 65–99)
POTASSIUM: 5.3 mmol/L — AB (ref 3.5–5.1)
Sodium: 138 mmol/L (ref 135–145)

## 2017-09-29 LAB — GLUCOSE, CAPILLARY
Glucose-Capillary: 138 mg/dL — ABNORMAL HIGH (ref 65–99)
Glucose-Capillary: 151 mg/dL — ABNORMAL HIGH (ref 65–99)
Glucose-Capillary: 153 mg/dL — ABNORMAL HIGH (ref 65–99)
Glucose-Capillary: 241 mg/dL — ABNORMAL HIGH (ref 65–99)

## 2017-09-29 MED ORDER — SODIUM POLYSTYRENE SULFONATE 15 GM/60ML PO SUSP
30.0000 g | Freq: Once | ORAL | Status: AC
  Administered 2017-09-29: 30 g via ORAL
  Filled 2017-09-29: qty 120

## 2017-09-29 MED ORDER — METOPROLOL TARTRATE 50 MG PO TABS
50.0000 mg | ORAL_TABLET | Freq: Two times a day (BID) | ORAL | Status: DC
  Administered 2017-09-29 – 2017-09-30 (×3): 50 mg via ORAL
  Filled 2017-09-29 (×3): qty 1

## 2017-09-29 MED ORDER — DILTIAZEM HCL 60 MG PO TABS
60.0000 mg | ORAL_TABLET | Freq: Once | ORAL | Status: AC
  Administered 2017-09-29: 60 mg via ORAL
  Filled 2017-09-29: qty 1

## 2017-09-29 NOTE — Progress Notes (Signed)
New referral for out patient Palliative to follow at home received from [facility] Stephanie Bowen. No discharge date at this time. [pateint information faxed to referral. Dayna BarkerKaren Robertson RN, BSN, [facility], hospital liaison [phone]

## 2017-09-29 NOTE — Care Management (Signed)
PatientsuffersfromCHFandhastroublebreathingatnightwhenheadiselevatedlessthan30degrees.Bedwedgesdonotprovideenoughelevationtoresolvebreathingissues.  SOB causepatienttorequirefrequentchangesinbodypositionwhichcannotbeachievedwithanormalbed 

## 2017-09-29 NOTE — Care Management (Signed)
Patient admitted from home with Acute hypoxic and hypercarbic respiratory failure.  Patient lives at home with grandson.  PCP Dareen PianoAnderson. Patient has "Copyjazzy scooter" in the home.  PT has assessed patient and recommended home health PT.  [facility].  [facility].  PT has requested hospital bed.  Patient will also need BSC at discharge.  Patient currently requiring acute O2.  Recommendation for outpatient palliative to follow.  Patient and family in agreement.  Clydie BraunKaren with [facility] notified of referral.  RNCM following

## 2017-09-29 NOTE — ACP (Advance Care Planning) (Signed)
MOST form was completed, [facility] and copy sent to scan- DNR, antibiotics and IV fluids if indicated, feeding tube for a defined trial period. Short term GOC is to stabilize patient for discharge home with home health. Pennie RushingSeth agrees to outpatient palliative followup for continued GOC.  We did discuss concern that patient will continue to decline given her history of multiple admissions and fact that with each admission patients tend to not return to their baseline, and recovery is complicated by difficulty taking in nutrition. Pennie RushingSeth understands that a transition to Hospice may be appropriate in the near future.  Referral for outpatient Palliative medicine to follow for continued GOC.  Ocie BobKasie Hilary Pundt, AGNP-C Palliative Medicine  Please call Palliative Medicine team phone with any questions [phone]. For individual providers please see AMION.

## 2017-09-29 NOTE — Progress Notes (Signed)
[facility] [place] [facility]   PATIENT NAME: Molly Wolf    MR#:  [id number]  DATE OF BIRTH:  11/10/1940    CHIEF COMPLAINT:   Chief Complaint  Patient presents with  . Respiratory Arrest   Awake and alert. Eating when fed. On 1 L o2  REVIEW OF SYSTEMS:   Review of Systems  Unable to perform ROS: Mental status change   DRUG ALLERGIES:   Allergies  Allergen Reactions  . Prednisone Other (See Comments)    halluciantions    VITALS:  Blood pressure 139/80, pulse (!) 113, temperature 97.8 F (36.6 C), resp. rate 18, height 5\' 6"  (1.676 m), weight 59 kg (130 lb), SpO2 97 %.  PHYSICAL EXAMINATION:  GENERAL:  76 y.o.-year-old patient lying in the bed with no acute distress.  Patient has continued shaking of hands and head secondary to Parkinson disease. EYES: Pupils equal, round, reactive to light and accommodation. No scleral icterus. Extraocular muscles intact.  HEENT: Head atraumatic, normocephalic. Oropharynx and nasopharynx clear.  NECK:  Supple, no jugular venous distention. No thyroid enlargement, no tenderness.  LUNGS: Decreased breath sound bilaterally.  CARDIOVASCULAR: S1, S2 normal. No murmurs, rubs, or gallops.  ABDOMEN: Soft, nontender, nondistended. Bowel sounds present. No organomegaly or mass.  EXTREMITIES: No pedal edema, cyanosis, or clubbing.  NEUROLOGIC:   decreased muscle strength overall but it is not new.   PSYCHIATRIC: The patient is awake and alert. SKIN: No obvious rash, lesion, or ulcer.   LABORATORY PANEL:   CBC Recent Labs  Lab 09/27/17 0439  WBC 5.6  HGB 11.8*  HCT 38.3  PLT 105*   ------------------------------------------------------------------------------------------------------------------  Chemistries  Recent Labs  Lab 09/25/17 1545  09/28/17 0429 09/29/17 0613  NA 137   < > 140 138  K 5.2*   < > 5.0 5.3*  CL 110   < > 113* 112*  CO2 22   < > 20* 19*  GLUCOSE 343*   < > 115* 170*  BUN  50*   < > 55* 60*  CREATININE 2.06*   < > 2.58* 2.73*  CALCIUM 8.8*   < > 8.3* 8.6*  MG  --    < > 1.8  --   AST 19  --   --   --   ALT <5*  --   --   --   ALKPHOS 63  --   --   --   BILITOT 1.0  --   --   --    < > = values in this interval not displayed.   ------------------------------------------------------------------------------------------------------------------  Cardiac Enzymes Recent Labs  Lab 09/26/17 0212  TROPONINI <0.03   ------------------------------------------------------------------------------------------------------------------  RADIOLOGY:  No results found.  EKG:   Orders placed or performed during the hospital encounter of 09/25/17  . ED EKG  . ED EKG  . EKG 12-Lead  . EKG 12-Lead    ASSESSMENT AND PLAN:  *Acute hypoxic and hypercarbic respiratory failure.  Was on BiPAP.  Now on nasal cannula. Wean off oxygen as tolerated. Treated today on 1 L oxygen Nebulizers.  * Left-sided aspiration pneumonia. On augmentin. Improving slowly  * AKI over CKD stage IV - worsening today On gentle IV hydration with history of CHF Was recently on HD temporarily  * Chronic systolic heart failure with EF 35% No sign of fluid overload  * Severe Parkinson disease  * Diabetes mellitus type 2. Sliding scale insulin  Patient doesn't want HD  All the  records are reviewed and case discussed with Care Management/Social Worker Management plans discussed with the patient, family and they are in agreement.  CODE STATUS: DNR  TOTAL TIME TAKING CARE OF THIS PATIENT: 35 minutes.   POSSIBLE D/C IN 1-2 DAYS, DEPENDING ON CLINICAL CONDITION.  Molinda BailiffSrikar R Ugo Thoma M.D on 09/29/2017 at 11:00 AM  Between 7am to 6pm - Pager - [phone]  After 6pm go to [email or web address] - password [email or web address] [facility]  [facility]  Office  [phone]  CC: Primary care physician; Lauro RegulusAnderson, Marshall W, MD   Note: This dictation was prepared with Dragon dictation along with  smaller phrase technology. Any transcriptional errors that result from this process are unintentional.

## 2017-09-29 NOTE — Progress Notes (Signed)
No family at bedside.  U[pdated grand daughter at bedside yesterday.  Called Seth(HCPOA) and left voicemail today

## 2017-09-29 NOTE — Progress Notes (Signed)
[facility]  ROUNDING NOTE   Subjective:   Family at bedside.  Seen earlier today. Family meeting today.   Creatinine 2.73 (2.58) (2.39)  Objective:  Vital signs in last 24 hours:  Temp:  [97.5 F (36.4 C)-97.8 F (36.6 C)] 97.5 F (36.4 C) (12/19 1426) Pulse Rate:  [113-173] 115 (12/19 1426) Resp:  [18-19] 18 (12/19 0511) BP: (112-148)/(80-110) 112/83 (12/19 1426) SpO2:  [91 %-99 %] 95 % (12/19 1426)  Weight change:  Filed Weights   09/25/17 1549  Weight: 59 kg (130 lb)    Intake/Output: I/O last 3 completed shifts: In: 600 [P.O.:360; NG/GT:240] Out: 1050 [Urine:1050]   Intake/Output this shift:  Total I/O In: 480 [P.O.:480] Out: -   Physical Exam: General: NAD, laying in bed  Head: Normocephalic, atraumatic. Moist oral mucosal membranes  Eyes: Anicteric, PERRL  Neck: Supple, trachea midline  Lungs:  Clear to auscultation  Heart: irregular  Abdomen:  Soft, nontender  Extremities: no peripheral edema.  Neurologic: Alert to self only, +tremor  Skin: No lesions  Access: none    Basic Metabolic Panel: Recent Labs  Lab 09/25/17 2133 09/26/17 0212 09/27/17 0439 09/28/17 0429 09/29/17 0613  NA 139 140 139 140 138  K 4.5 4.8 5.2* 5.0 5.3*  CL 111 113* 113* 113* 112*  CO2 21* 20* 20* 20* 19*  GLUCOSE 346* 207* 195* 115* 170*  BUN 50* 47* 52* 55* 60*  CREATININE 1.94* 1.96* 2.39* 2.58* 2.73*  CALCIUM 8.0* 8.1* 8.2* 8.3* 8.6*  MG 1.7  --   --  1.8  --   PHOS 4.4  --   --  4.3  --     Liver Function Tests: Recent Labs  Lab 09/25/17 1545  AST 19  ALT <5*  ALKPHOS 63  BILITOT 1.0  PROT 6.2*  ALBUMIN 3.3*   No results for input(s): LIPASE, AMYLASE in the last 168 hours. No results for input(s): AMMONIA in the last 168 hours.  CBC: Recent Labs  Lab 09/25/17 1545 09/25/17 2133 09/26/17 0212 09/27/17 0439  WBC 9.4  --  7.2 5.6  NEUTROABS 7.2*  --   --   --   HGB 13.1 12.4 12.2 11.8*  HCT 43.0 40.7 39.6 38.3  MCV 103.0*  --   103.0* 103.0*  PLT 169  --  106* 105*    Cardiac Enzymes: Recent Labs  Lab 09/25/17 1545 09/25/17 2133 09/26/17 0212  TROPONINI <0.03 <0.03 <0.03    BNP: Invalid input(s): POCBNP  CBG: Recent Labs  Lab 09/28/17 1201 09/28/17 1816 09/29/17 0030 09/29/17 0608 09/29/17 1233  GLUCAP 155* 207* 241* 153* 151*    Microbiology: Results for orders placed or performed during the hospital encounter of 09/25/17  Culture, blood (routine x 2)     Status: None (Preliminary result)   Collection Time: 09/25/17  3:45 PM  Result Value Ref Range Status   Specimen Description BLOOD RIGHT ANTECUBITAL  Final   Special Requests   Final    BOTTLES DRAWN AEROBIC AND ANAEROBIC Blood Culture adequate volume   Culture NO GROWTH 4 DAYS  Final   Report Status PENDING  Incomplete  Culture, blood (routine x 2)     Status: None (Preliminary result)   Collection Time: 09/25/17  4:03 PM  Result Value Ref Range Status   Specimen Description BLOOD BLOOD LEFT FOREARM  Final   Special Requests   Final    BOTTLES DRAWN AEROBIC AND ANAEROBIC Blood Culture results may not be optimal due  to an inadequate volume of blood received in culture bottles   Culture NO GROWTH 4 DAYS  Final   Report Status PENDING  Incomplete  CULTURE, BLOOD (ROUTINE X 2) w Reflex to ID Panel     Status: None (Preliminary result)   Collection Time: 09/25/17  7:26 PM  Result Value Ref Range Status   Specimen Description BLOOD RIGHT HAND  Final   Special Requests   Final    BOTTLES DRAWN AEROBIC AND ANAEROBIC Blood Culture adequate volume   Culture NO GROWTH 4 DAYS  Final   Report Status PENDING  Incomplete  CULTURE, BLOOD (ROUTINE X 2) w Reflex to ID Panel     Status: None (Preliminary result)   Collection Time: 09/25/17  7:39 PM  Result Value Ref Range Status   Specimen Description BLOOD LEFT ANTECUBITAL  Final   Special Requests   Final    BOTTLES DRAWN AEROBIC AND ANAEROBIC Blood Culture adequate volume   Culture NO GROWTH 4  DAYS  Final   Report Status PENDING  Incomplete  Urine culture     Status: None   Collection Time: 09/25/17  7:41 PM  Result Value Ref Range Status   Specimen Description URINE, RANDOM  Final   Special Requests NONE  Final   Culture   Final    NO GROWTH Performed at [facility], [address] [place], [place] [address]    Report Status 09/27/2017 FINAL  Final  MRSA PCR Screening     Status: None   Collection Time: 09/25/17  7:41 PM  Result Value Ref Range Status   MRSA by PCR NEGATIVE NEGATIVE Final    Comment:        The GeneXpert MRSA Assay (FDA approved for NASAL specimens only), is one component of a comprehensive MRSA colonization surveillance program. It is not intended to diagnose MRSA infection nor to guide or monitor treatment for MRSA infections.   Gastrointestinal Panel by PCR , Stool     Status: None   Collection Time: 09/25/17  7:41 PM  Result Value Ref Range Status   Campylobacter species NOT DETECTED NOT DETECTED Final   Plesimonas shigelloides NOT DETECTED NOT DETECTED Final   Salmonella species NOT DETECTED NOT DETECTED Final   Yersinia enterocolitica NOT DETECTED NOT DETECTED Final   Vibrio species NOT DETECTED NOT DETECTED Final   Vibrio cholerae NOT DETECTED NOT DETECTED Final   Enteroaggregative E coli (EAEC) NOT DETECTED NOT DETECTED Final   Enteropathogenic E coli (EPEC) NOT DETECTED NOT DETECTED Final   Enterotoxigenic E coli (ETEC) NOT DETECTED NOT DETECTED Final   Shiga like toxin producing E coli (STEC) NOT DETECTED NOT DETECTED Final   Shigella/Enteroinvasive E coli (EIEC) NOT DETECTED NOT DETECTED Final   Cryptosporidium NOT DETECTED NOT DETECTED Final   Cyclospora cayetanensis NOT DETECTED NOT DETECTED Final   Entamoeba histolytica NOT DETECTED NOT DETECTED Final   Giardia lamblia NOT DETECTED NOT DETECTED Final   Adenovirus F40/41 NOT DETECTED NOT DETECTED Final   Astrovirus NOT DETECTED NOT DETECTED Final   Norovirus GI/GII NOT  DETECTED NOT DETECTED Final   Rotavirus A NOT DETECTED NOT DETECTED Final   Sapovirus (I, II, IV, and V) NOT DETECTED NOT DETECTED Final  C difficile quick scan w PCR reflex     Status: None   Collection Time: 09/25/17  7:41 PM  Result Value Ref Range Status   C Diff antigen NEGATIVE NEGATIVE Final   C Diff toxin NEGATIVE NEGATIVE Final  C Diff interpretation No C. difficile detected.  Final    Coagulation Studies: No results for input(s): LABPROT, INR in the last 72 hours.  Urinalysis: No results for input(s): COLORURINE, LABSPEC, PHURINE, GLUCOSEU, HGBUR, BILIRUBINUR, KETONESUR, PROTEINUR, UROBILINOGEN, NITRITE, LEUKOCYTESUR in the last 72 hours.  Invalid input(s): APPERANCEUR    Imaging: No results found.   Medications:    . amoxicillin-clavulanate  500 mg of amoxicillin Oral Q12H  . feeding supplement (NEPRO CARB STEADY)  237 mL Oral BID BM  . insulin aspart  0-15 Units Subcutaneous Q6H  . LORazepam  0.5 mg Oral QHS  . metoprolol tartrate  50 mg Oral BID  . multivitamin  1 tablet Oral QHS  . multivitamin with minerals  1 tablet Oral Once per day on Mon Thu  . pantoprazole (PROTONIX) IV  40 mg Intravenous Q24H  . sulfacetamide  1 drop Left Eye Q4H  . vitamin C  500 mg Oral BID   ipratropium-albuterol, morphine injection, nitroGLYCERIN, [DISCONTINUED] ondansetron **OR** ondansetron (ZOFRAN) IV, polyethylene glycol  Assessment/ Plan:  Ms. Yenesis Even is a 76 y.o. white female with hypertension, hyperlipidemia, Parkinson's, DVT, Diabetes mellitus type II, Depression, Coronary artery disease, COPD, Anxiety, Osteoarthritis   1.  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 GFR of 17-25. Creatinine does fluctuate with volume status.  Required hemodialysis in the past.  Monitor volume status closely.  - low potassium diet.   2.  Acute respiratory failure secondary to pneumonia. On 2 L [place] Oxygen.  Afebrile, no leukocytosis -  PO Augmentin - Supportive care.   3. Hypertension with systolic congestive heart failure: EF of 30% on echocardiogram. Currently off diuretics. Blood pressure at goal.   4.  Diabetes mellitus type 2 with chronic kidney disease: Continue glucose control.   Appreciate Palliative care input.    LOS: 4 Molly Wolf 12/19/20183:17 PM

## 2017-09-29 NOTE — Progress Notes (Addendum)
RN removed foley per order. New orders to bladder scan pt Q12 hour if she has not voided due to patients kidney failure, if bladder scan is >500 then notified MD. Will continue to monitor   Bernece Gall [facility]

## 2017-09-29 NOTE — Progress Notes (Signed)
Central telemetry notified RN that pt was in Afib with HR of 118. VSS, pt states she "feels fine." Prime doctor aware, new orders placed by prime doctor. Will continue to monitor pt.   Kamylle Axelson [facility]

## 2017-09-29 NOTE — Progress Notes (Addendum)
Daily Progress Note   Patient Name: Molly Wolf       Date: 09/29/2017 DOB: 1941/02/16  Age: 76 y.o. MRN#: [id number] Attending Physician: Hillary Bow, MD Primary Care Physician: Kirk Ruths, MD Admit Date: 09/25/2017  Reason for Consultation/Follow-up: Establishing goals of care  Subjective: Patient sitting up- eating biscuits and gravy, being fed by family. Met with HCPOA Forrest "[name]" Linton Rump and patient's daughter, [name] for follow up of [place] meeting.  Discussed patient's quality of life at home, illness and disease trajectory, comorbidities and GOC.  [name] states patient is wheelchair bound at baseline. Requires assistance with all ADL's, however, has good quality of life- enjoys spending time with her dog and grandchildren and great grandchildren. She is cared for by him and his girlfriend who are available around the clock. He says that being home and being comfortable are important to her and him, however, prolonging time also continue to be a priority. Time spent in the hospital receiving IV fluids and antibiotics is acceptable, however, would not want further aggressive care. They would consider temporary dialysis as this helped improve her function in the past.  MOST form was completed, original was given to [facility] and copy sent to scan- DNR, antibiotics and IV fluids if indicated, feeding tube for a defined trial period. Short term GOC is to stabilize patient for discharge home with home health. [name] agrees to outpatient palliative followup for continued GOC.  We did discuss concern that patient will continue to decline given her history of multiple admissions and fact that with each admission patients tend to not return to their baseline, and recovery is complicated by  difficulty taking in nutrition. [name] understands that a transition to Hospice may be appropriate in the near future.   Review of Systems  Constitutional: Negative for malaise/fatigue.  Respiratory: Negative for shortness of breath.   Gastrointestinal: Positive for nausea.  Psychiatric/Behavioral: The patient does not have insomnia.     Length of Stay: 4  Current Medications: Scheduled Meds:  . amoxicillin-clavulanate  500 mg of amoxicillin Oral Q12H  . feeding supplement (NEPRO CARB STEADY)  237 mL Oral BID BM  . insulin aspart  0-15 Units Subcutaneous Q6H  . LORazepam  0.5 mg Oral QHS  . metoprolol tartrate  50 mg Oral BID  . multivitamin  1 tablet  Oral QHS  . multivitamin with minerals  1 tablet Oral Once per day on Mon Thu  . pantoprazole (PROTONIX) IV  40 mg Intravenous Q24H  . sulfacetamide  1 drop Left Eye Q4H  . vitamin C  500 mg Oral BID    Continuous Infusions:   PRN Meds: ipratropium-albuterol, morphine injection, nitroGLYCERIN, [DISCONTINUED] ondansetron **OR** ondansetron (ZOFRAN) IV, polyethylene glycol  Physical Exam  Constitutional:  Frail, elderly  Cardiovascular:  tachycardic  Pulmonary/Chest: Effort normal.  Neurological: She is alert.  Nursing note and vitals reviewed.           Vital Signs: BP 120/89   Pulse (!) 113   Temp 97.8 F (36.6 C)   Resp 18   Ht 5' 6"  (1.676 m)   Wt 59 kg (130 lb)   SpO2 97%   BMI 20.98 kg/m  SpO2: SpO2: 97 % O2 Device: O2 Device: Not Delivered O2 Flow Rate: O2 Flow Rate (L/min): 2 L/min  Intake/output summary:   Intake/Output Summary (Last 24 hours) at 09/29/2017 1154 Last data filed at 09/29/2017 1046 Gross per 24 hour  Intake 480 ml  Output 525 ml  Net -45 ml   LBM: Last BM Date: (unsure) Baseline Weight: Weight: 59 kg (130 lb) Most recent weight: Weight: 59 kg (130 lb)       Palliative Assessment/Data: PPS: 30%     Patient Active Problem List   Diagnosis Date Noted  . Parkinson's disease  ([place])   . Advance care planning   . Palliative care by specialist   . Acute respiratory failure ([place]) 09/25/2017  . Acute kidney injury ([place]) 08/05/2017  . PNA (pneumonia) 07/02/2017  . DVT (deep venous thrombosis) ([place]) 05/21/2017  . Confusion   . Goals of care, counseling/discussion   . Acute renal failure with acute tubular necrosis superimposed on stage 4 chronic kidney disease ([place])   . Palliative care encounter   . Coronary artery disease of native artery of native heart with stable angina pectoris ([place]) 03/31/2017  . Persistent atrial fibrillation ([place]) 03/31/2017  . Chronic combined systolic and diastolic heart failure ([place]) 02/26/2017  . HTN (hypertension) 02/26/2017  . Parkinson disease ([place]) 02/26/2017  . Intractable pain 12/27/2016    Palliative Care Assessment & Plan   Patient Profile: 76 y.o. female  with past medical history of Parkinson's dementia, CKD IV, CHF (35%), COPD, A.Fib, depression admitted on 09/25/2017 with respiratory distress post aspiration event. During admission she required Bipap, but vomited into the bipap. She was made DNR per attending MD.  Decision was made to proceed with IV antibiotics and fluids over the weekend and then readdress [place]. Palliative medicine consulted for continued GOC.     Assessment/Recommendations/Plan   Continue current level of care- no escalation  D/C home with outpatient palliative  Code Status:  DNR  Prognosis:   < 6 months due to progressing Parkinson's, dysphagia with recurrent aspiration pneumonia admissions  Discharge Planning:  Home with Palliative Services  Care plan was discussed with [place].  Thank you for allowing the Palliative Medicine Team to assist in the care of this patient.   Time In: 1100 Time Out: 1210 Total Time 70 minutes Prolonged Time Billed Yes      Greater than 50%  of this time was spent counseling and coordinating care related to the above assessment and  plan.  Mariana Kaufman, AGNP-C Palliative Medicine   Please contact Palliative Medicine Team phone at [phone] for questions and concerns.

## 2017-09-30 ENCOUNTER — Inpatient Hospital Stay: Payer: Medicare Other

## 2017-09-30 DIAGNOSIS — J90 Pleural effusion, not elsewhere classified: Secondary | ICD-10-CM

## 2017-09-30 DIAGNOSIS — E43 Unspecified severe protein-calorie malnutrition: Secondary | ICD-10-CM

## 2017-09-30 DIAGNOSIS — R0602 Shortness of breath: Secondary | ICD-10-CM

## 2017-09-30 LAB — BASIC METABOLIC PANEL
Anion gap: 7 (ref 5–15)
BUN: 64 mg/dL — ABNORMAL HIGH (ref 6–20)
CALCIUM: 8.5 mg/dL — AB (ref 8.9–10.3)
CHLORIDE: 113 mmol/L — AB (ref 101–111)
CO2: 19 mmol/L — ABNORMAL LOW (ref 22–32)
CREATININE: 2.45 mg/dL — AB (ref 0.44–1.00)
GFR calc non Af Amer: 18 mL/min — ABNORMAL LOW (ref >60)
GFR, EST AFRICAN AMERICAN: 21 mL/min — AB (ref >60)
Glucose, Bld: 143 mg/dL — ABNORMAL HIGH (ref 65–99)
Potassium: 5 mmol/L (ref 3.5–5.1)
SODIUM: 139 mmol/L (ref 135–145)

## 2017-09-30 LAB — CULTURE, BLOOD (ROUTINE X 2)
CULTURE: NO GROWTH
CULTURE: NO GROWTH
Culture: NO GROWTH
Culture: NO GROWTH
SPECIAL REQUESTS: ADEQUATE
Special Requests: ADEQUATE
Special Requests: ADEQUATE

## 2017-09-30 LAB — GLUCOSE, CAPILLARY
GLUCOSE-CAPILLARY: 110 mg/dL — AB (ref 65–99)
GLUCOSE-CAPILLARY: 131 mg/dL — AB (ref 65–99)
GLUCOSE-CAPILLARY: 150 mg/dL — AB (ref 65–99)
Glucose-Capillary: 126 mg/dL — ABNORMAL HIGH (ref 65–99)

## 2017-09-30 MED ORDER — MORPHINE SULFATE (CONCENTRATE) 10 MG/0.5ML PO SOLN: 2.5000 mg | ORAL | Status: DC | PRN

## 2017-09-30 MED ORDER — FUROSEMIDE 10 MG/ML IJ SOLN
60.0000 mg | Freq: Once | INTRAMUSCULAR | Status: AC
  Administered 2017-09-30: 60 mg via INTRAVENOUS
  Filled 2017-09-30: qty 8

## 2017-09-30 MED ORDER — DILTIAZEM HCL 30 MG PO TABS
30.0000 mg | ORAL_TABLET | Freq: Three times a day (TID) | ORAL | Status: DC
  Administered 2017-09-30 – 2017-10-01 (×3): 30 mg via ORAL
  Filled 2017-09-30 (×7): qty 1

## 2017-09-30 MED ORDER — METOPROLOL TARTRATE 25 MG PO TABS
25.0000 mg | ORAL_TABLET | Freq: Two times a day (BID) | ORAL | Status: DC
  Administered 2017-09-30 – 2017-10-04 (×8): 25 mg via ORAL
  Filled 2017-09-30 (×8): qty 1

## 2017-09-30 MED ORDER — IPRATROPIUM-ALBUTEROL 0.5-2.5 (3) MG/3ML IN SOLN
3.0000 mL | Freq: Four times a day (QID) | RESPIRATORY_TRACT | Status: DC
  Administered 2017-09-30 – 2017-10-01 (×4): 3 mL via RESPIRATORY_TRACT
  Filled 2017-09-30: qty 30
  Filled 2017-09-30 (×2): qty 3

## 2017-09-30 MED ORDER — SODIUM POLYSTYRENE SULFONATE 15 GM/60ML PO SUSP
30.0000 g | Freq: Once | ORAL | Status: DC
Start: 2017-09-30 — End: 2017-09-30
  Filled 2017-09-30: qty 120

## 2017-09-30 MED ORDER — METHYLPREDNISOLONE SODIUM SUCC 125 MG IJ SOLR
60.0000 mg | Freq: Once | INTRAMUSCULAR | Status: AC
  Administered 2017-09-30: 60 mg via INTRAVENOUS
  Filled 2017-09-30: qty 2

## 2017-09-30 NOTE — Progress Notes (Signed)
[facility]  ROUNDING NOTE   Subjective:   Granddaughter at bedside.   Creatinine 2.45 (2.73) (2.58) (2.39)  Objective:  Vital signs in last 24 hours:  Temp:  [97.5 F (36.4 C)-98.1 F (36.7 C)] 97.9 F (36.6 C) (12/20 1221) Pulse Rate:  [92-122] 92 (12/20 1411) Resp:  [18] 18 (12/20 1221) BP: (105-126)/(66-95) 106/66 (12/20 1411) SpO2:  [96 %-99 %] 99 % (12/20 1411)  Weight change:  Filed Weights   09/25/17 1549  Weight: 59 kg (130 lb)    Intake/Output: I/O last 3 completed shifts: In: 740 [P.O.:740] Out: 525 [Urine:525]   Intake/Output this shift:  No intake/output data recorded.  Physical Exam: General: NAD, laying in bed  Head: Normocephalic, atraumatic. Moist oral mucosal membranes  Eyes: Anicteric, PERRL  Neck: Supple, trachea midline  Lungs:  Clear to auscultation  Heart: irregular  Abdomen:  Soft, nontender  Extremities: no peripheral edema.  Neurologic: Alert to self only, +tremor  Skin: No lesions  Access: none    Basic Metabolic Panel: Recent Labs  Lab 09/25/17 2133 09/26/17 0212 09/27/17 0439 09/28/17 0429 09/29/17 0613 09/30/17 0419  NA 139 140 139 140 138 139  K 4.5 4.8 5.2* 5.0 5.3* 5.0  CL 111 113* 113* 113* 112* 113*  CO2 21* 20* 20* 20* 19* 19*  GLUCOSE 346* 207* 195* 115* 170* 143*  BUN 50* 47* 52* 55* 60* 64*  CREATININE 1.94* 1.96* 2.39* 2.58* 2.73* 2.45*  CALCIUM 8.0* 8.1* 8.2* 8.3* 8.6* 8.5*  MG 1.7  --   --  1.8  --   --   PHOS 4.4  --   --  4.3  --   --     Liver Function Tests: Recent Labs  Lab 09/25/17 1545  AST 19  ALT <5*  ALKPHOS 63  BILITOT 1.0  PROT 6.2*  ALBUMIN 3.3*   No results for input(s): LIPASE, AMYLASE in the last 168 hours. No results for input(s): AMMONIA in the last 168 hours.  CBC: Recent Labs  Lab 09/25/17 1545 09/25/17 2133 09/26/17 0212 09/27/17 0439  WBC 9.4  --  7.2 5.6  NEUTROABS 7.2*  --   --   --   HGB 13.1 12.4 12.2 11.8*  HCT 43.0 40.7 39.6 38.3  MCV 103.0*   --  103.0* 103.0*  PLT 169  --  106* 105*    Cardiac Enzymes: Recent Labs  Lab 09/25/17 1545 09/25/17 2133 09/26/17 0212  TROPONINI <0.03 <0.03 <0.03    BNP: Invalid input(s): POCBNP  CBG: Recent Labs  Lab 09/29/17 1233 09/29/17 1725 09/30/17 0003 09/30/17 0502 09/30/17 1141  GLUCAP 151* 138* 150* 131* 126*    Microbiology: Results for orders placed or performed during the hospital encounter of 09/25/17  Culture, blood (routine x 2)     Status: None   Collection Time: 09/25/17  3:45 PM  Result Value Ref Range Status   Specimen Description BLOOD RIGHT ANTECUBITAL  Final   Special Requests   Final    BOTTLES DRAWN AEROBIC AND ANAEROBIC Blood Culture adequate volume   Culture NO GROWTH 5 DAYS  Final   Report Status 09/30/2017 FINAL  Final  Culture, blood (routine x 2)     Status: None   Collection Time: 09/25/17  4:03 PM  Result Value Ref Range Status   Specimen Description BLOOD BLOOD LEFT FOREARM  Final   Special Requests   Final    BOTTLES DRAWN AEROBIC AND ANAEROBIC Blood Culture results may not be  optimal due to an inadequate volume of blood received in culture bottles   Culture NO GROWTH 5 DAYS  Final   Report Status 09/30/2017 FINAL  Final  CULTURE, BLOOD (ROUTINE X 2) w Reflex to ID Panel     Status: None   Collection Time: 09/25/17  7:26 PM  Result Value Ref Range Status   Specimen Description BLOOD RIGHT HAND  Final   Special Requests   Final    BOTTLES DRAWN AEROBIC AND ANAEROBIC Blood Culture adequate volume   Culture NO GROWTH 5 DAYS  Final   Report Status 09/30/2017 FINAL  Final  CULTURE, BLOOD (ROUTINE X 2) w Reflex to ID Panel     Status: None   Collection Time: 09/25/17  7:39 PM  Result Value Ref Range Status   Specimen Description BLOOD LEFT ANTECUBITAL  Final   Special Requests   Final    BOTTLES DRAWN AEROBIC AND ANAEROBIC Blood Culture adequate volume   Culture NO GROWTH 5 DAYS  Final   Report Status 09/30/2017 FINAL  Final  Urine  culture     Status: None   Collection Time: 09/25/17  7:41 PM  Result Value Ref Range Status   Specimen Description URINE, RANDOM  Final   Special Requests NONE  Final   Culture   Final    NO GROWTH Performed at [facility], [address] [place], [place] [address]    Report Status 09/27/2017 FINAL  Final  MRSA PCR Screening     Status: None   Collection Time: 09/25/17  7:41 PM  Result Value Ref Range Status   MRSA by PCR NEGATIVE NEGATIVE Final    Comment:        The GeneXpert MRSA Assay (FDA approved for NASAL specimens only), is one component of a comprehensive MRSA colonization surveillance program. It is not intended to diagnose MRSA infection nor to guide or monitor treatment for MRSA infections.   Gastrointestinal Panel by PCR , Stool     Status: None   Collection Time: 09/25/17  7:41 PM  Result Value Ref Range Status   Campylobacter species NOT DETECTED NOT DETECTED Final   Plesimonas shigelloides NOT DETECTED NOT DETECTED Final   Salmonella species NOT DETECTED NOT DETECTED Final   Yersinia enterocolitica NOT DETECTED NOT DETECTED Final   Vibrio species NOT DETECTED NOT DETECTED Final   Vibrio cholerae NOT DETECTED NOT DETECTED Final   Enteroaggregative E coli (EAEC) NOT DETECTED NOT DETECTED Final   Enteropathogenic E coli (EPEC) NOT DETECTED NOT DETECTED Final   Enterotoxigenic E coli (ETEC) NOT DETECTED NOT DETECTED Final   Shiga like toxin producing E coli (STEC) NOT DETECTED NOT DETECTED Final   Shigella/Enteroinvasive E coli (EIEC) NOT DETECTED NOT DETECTED Final   Cryptosporidium NOT DETECTED NOT DETECTED Final   Cyclospora cayetanensis NOT DETECTED NOT DETECTED Final   Entamoeba histolytica NOT DETECTED NOT DETECTED Final   Giardia lamblia NOT DETECTED NOT DETECTED Final   Adenovirus F40/41 NOT DETECTED NOT DETECTED Final   Astrovirus NOT DETECTED NOT DETECTED Final   Norovirus GI/GII NOT DETECTED NOT DETECTED Final   Rotavirus A NOT DETECTED  NOT DETECTED Final   Sapovirus (I, II, IV, and V) NOT DETECTED NOT DETECTED Final  C difficile quick scan w PCR reflex     Status: None   Collection Time: 09/25/17  7:41 PM  Result Value Ref Range Status   C Diff antigen NEGATIVE NEGATIVE Final   C Diff toxin NEGATIVE NEGATIVE  Final   C Diff interpretation No C. difficile detected.  Final    Coagulation Studies: No results for input(s): LABPROT, INR in the last 72 hours.  Urinalysis: No results for input(s): COLORURINE, LABSPEC, PHURINE, GLUCOSEU, HGBUR, BILIRUBINUR, KETONESUR, PROTEINUR, UROBILINOGEN, NITRITE, LEUKOCYTESUR in the last 72 hours.  Invalid input(s): APPERANCEUR    Imaging: No results found.   Medications:    . amoxicillin-clavulanate  500 mg of amoxicillin Oral Q12H  . diltiazem  30 mg Oral Q8H  . feeding supplement (NEPRO CARB STEADY)  237 mL Oral BID BM  . insulin aspart  0-15 Units Subcutaneous Q6H  . ipratropium-albuterol  3 mL Nebulization Q6H  . LORazepam  0.5 mg Oral QHS  . methylPREDNISolone (SOLU-MEDROL) injection  60 mg Intravenous Once  . metoprolol tartrate  25 mg Oral BID  . multivitamin  1 tablet Oral QHS  . multivitamin with minerals  1 tablet Oral Once per day on Mon Thu  . pantoprazole (PROTONIX) IV  40 mg Intravenous Q24H  . sulfacetamide  1 drop Left Eye Q4H  . vitamin C  500 mg Oral BID   morphine injection, nitroGLYCERIN, [DISCONTINUED] ondansetron **OR** ondansetron (ZOFRAN) IV, polyethylene glycol  Assessment/ Plan:  [name] is a 76 y.o. white female with hypertension, hyperlipidemia, Parkinson's, DVT, Diabetes mellitus type II, Depression, Coronary artery disease, COPD, Anxiety, Osteoarthritis   1.  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 GFR of 17-25. Creatinine does fluctuate with volume status.  Required hemodialysis in the past.  Monitor volume status closely.  - low potassium diet.   2.   Acute respiratory failure secondary to pneumonia. On 2 L [place] Oxygen. Afebrile, no leukocytosis -  PO Augmentin - Supportive care.   3. Hypertension with systolic congestive heart failure: EF of 30% on echocardiogram. Currently off diuretics. Blood pressure at goal.   4.  Diabetes mellitus type 2 with chronic kidney disease: Continue glucose control.   Appreciate Palliative care input.    LOS: 5 Molly Wolf 12/20/20182:37 PM

## 2017-09-30 NOTE — Progress Notes (Signed)
MD notified that pt is very lethargic, labor and purse lip breathing, very poor appetite, she is only responding to voice, and only waking up to take medications. Vitals were taken and were BP 106/66, P- 92, O2- 99. RN listened to her lungs again and heard bilateral wheezing. MD placed new orders. MD notified family and POA of pt.'s current status. Will continue to monitor pt closely.   Shanice Poznanski [facility]

## 2017-09-30 NOTE — Consult Note (Signed)
Reason for Consult: Shortness of breath congestive heart failure atrial fibrillation Referring Physician: Dr. Frazier Richards primary Dr. Tressia Miners hospitalist  Molly Wolf is an 76 y.o. female.  HPI: Patient 76 year old female severe Parkinson's disease severe renal insufficiency COPD respiratory failure evidence of possible aspiration severe tremors respiratory failure now here for evaluation.  Patient was found to have atrial fibrillation but not a good anticoagulation candidate denies any chest pain.  Patient also has trouble with altered mental status.  Patient was treated with BiPAP for respiratory failure has had significant improvement since  Past Medical History:  Diagnosis Date  . Anxiety   . Arthritis   . Chronic combined systolic and diastolic CHF (congestive heart failure) ([place])    a. 01/2008 Echo ([facility]): EF > 55%, mod-sev LVH w/ grade 1 DD, biatrial enlargement, mild AS, trace MR/TR; b. EF 45-50%, GR2DD, mild to moderate aortic stenosis, mild aortic insufficieny, mild to moderate mitral regurgitation, mild tricuspid regurgitation, mild biatrial enlargement, and a small pericardial effusion  . CKD (chronic kidney disease), stage IV ([place])   . Closed patellar sleeve fracture of right knee    a. 01/2017 - conservatively managed.  Marland Kitchen COPD (chronic obstructive pulmonary disease) ([place])   . Coronary artery disease    a. s/p remote stenting of unknown vessel @ [facility].  . Depression   . Diabetes mellitus with complication ([place])   . DVT (deep venous thrombosis) (H[facility])    a. in the setting of right patellar fracture on Coumadin  . Hyperlipidemia   . Hypertension   . Parkinson disease ([place])   . Renal insufficiency     Past Surgical History:  Procedure Laterality Date  . BACK SURGERY    . CORONARY ANGIOPLASTY WITH STENT PLACEMENT    . DIALYSIS/PERMA CATHETER INSERTION N/A 04/22/2017   Procedure: Dialysis/Perma Catheter Insertion;  Surgeon: Algernon Huxley, MD;  Location: [place] [facility];  Service: Cardiovascular;  Laterality: N/A;  . DIALYSIS/PERMA CATHETER REMOVAL N/A 09/23/2017   Procedure: DIALYSIS/PERMA CATHETER REMOVAL;  Surgeon: Algernon Huxley, MD;  Location: [place] [facility];  Service: Cardiovascular;  Laterality: N/A;  . ESOPHAGOGASTRODUODENOSCOPY N/A 04/09/2017   Procedure: ESOPHAGOGASTRODUODENOSCOPY (EGD);  Surgeon: Jonathon Bellows, MD;  Location: [facility] ENDOSCOPY;  Service: Endoscopy;  Laterality: N/A;  . IVC FILTER INSERTION N/A 04/23/2017   Procedure: IVC Filter Insertion;  Surgeon: Katha Cabal, MD;  Location: [place] [facility];  Service: Cardiovascular;  Laterality: N/A;  . NECK SURGERY      Family History  Problem Relation Age of Onset  . Cervical cancer Mother   . Kidney failure Father   . CAD Father   . CAD Brother   . Prostate cancer Neg Hx   . Kidney cancer Neg Hx   . Bladder Cancer Neg Hx     Social History:  reports that  has never smoked. she has never used smokeless tobacco. She reports that she does not drink alcohol or use drugs.  Allergies:  Allergies  Allergen Reactions  . Prednisone Other (See Comments)    halluciantions    Medications: Refer to previous medication list  Results for orders placed or performed during the hospital encounter of 09/25/17 (from the past 48 hour(s))  Glucose, capillary     Status: Abnormal   Collection Time: 09/29/17 12:30 AM  Result Value Ref Range   Glucose-Capillary 241 (H) 65 - 99 mg/dL  Glucose, capillary     Status: Abnormal   Collection Time: 09/29/17  6:08 AM  Result Value Ref Range   Glucose-Capillary 153 (H) 65 - 99 mg/dL  Basic metabolic panel     Status: Abnormal   Collection Time: 09/29/17  6:13 AM  Result Value Ref Range   Sodium 138 135 - 145 mmol/L   Potassium 5.3 (H) 3.5 - 5.1 mmol/L   Chloride 112 (H) 101 - 111 mmol/L   CO2 19 (L) 22 - 32 mmol/L   Glucose, Bld 170 (H) 65 - 99 mg/dL   BUN 60 (H) 6 - 20 mg/dL   Creatinine, Ser 2.73 (H) 0.44 - 1.00 mg/dL   Calcium 8.6  (L) 8.9 - 10.3 mg/dL   GFR calc non Af Amer 16 (L) >60 mL/min   GFR calc Af Amer 18 (L) >60 mL/min    Comment: (NOTE) The eGFR has been calculated using the CKD EPI equation. This calculation has not been validated in all clinical situations. eGFR's persistently <60 mL/min signify possible Chronic Kidney Disease.    Anion gap 7 5 - 15  Glucose, capillary     Status: Abnormal   Collection Time: 09/29/17 12:33 PM  Result Value Ref Range   Glucose-Capillary 151 (H) 65 - 99 mg/dL   Comment 1 Notify RN   Glucose, capillary     Status: Abnormal   Collection Time: 09/29/17  5:25 PM  Result Value Ref Range   Glucose-Capillary 138 (H) 65 - 99 mg/dL   Comment 1 Notify RN   Glucose, capillary     Status: Abnormal   Collection Time: 09/30/17 12:03 AM  Result Value Ref Range   Glucose-Capillary 150 (H) 65 - 99 mg/dL  Basic metabolic panel     Status: Abnormal   Collection Time: 09/30/17  4:19 AM  Result Value Ref Range   Sodium 139 135 - 145 mmol/L   Potassium 5.0 3.5 - 5.1 mmol/L   Chloride 113 (H) 101 - 111 mmol/L   CO2 19 (L) 22 - 32 mmol/L   Glucose, Bld 143 (H) 65 - 99 mg/dL   BUN 64 (H) 6 - 20 mg/dL   Creatinine, Ser 2.45 (H) 0.44 - 1.00 mg/dL   Calcium 8.5 (L) 8.9 - 10.3 mg/dL   GFR calc non Af Amer 18 (L) >60 mL/min   GFR calc Af Amer 21 (L) >60 mL/min    Comment: (NOTE) The eGFR has been calculated using the CKD EPI equation. This calculation has not been validated in all clinical situations. eGFR's persistently <60 mL/min signify possible Chronic Kidney Disease.    Anion gap 7 5 - 15  Glucose, capillary     Status: Abnormal   Collection Time: 09/30/17  5:02 AM  Result Value Ref Range   Glucose-Capillary 131 (H) 65 - 99 mg/dL  Glucose, capillary     Status: Abnormal   Collection Time: 09/30/17 11:41 AM  Result Value Ref Range   Glucose-Capillary 126 (H) 65 - 99 mg/dL   Comment 1 Notify RN   Glucose, capillary     Status: Abnormal   Collection Time: 09/30/17  5:30 PM   Result Value Ref Range   Glucose-Capillary 110 (H) 65 - 99 mg/dL    Dg Chest 2 View  Result Date: 09/30/2017 CLINICAL DATA:  Pleural effusion EXAM: CHEST  2 VIEW COMPARISON:  09/25/2017, 10/27/ 2018 FINDINGS: Small right pleural effusion. Moderate left pleural effusion, increased compared to prior radiograph. Enlarged cardiomediastinal silhouette with aortic atherosclerosis. Progressive consolidation in the left lower lung. No pneumothorax. IMPRESSION: 1. Bilateral pleural effusion, small on  the right and moderate on the left, findings are increased on the left side compared to prior radiograph 2. Persistent consolidation in the lingula and left lung base 3. Cardiomegaly with vascular congestion Electronically Signed   By: Donavan Foil M.D.   On: 09/30/2017 14:40    Review of Systems  Constitutional: Positive for malaise/fatigue.  HENT: Positive for congestion.   Eyes: Negative.   Respiratory: Positive for cough and shortness of breath.   Cardiovascular: Positive for palpitations and PND.  Gastrointestinal: Negative.   Genitourinary: Negative.   Musculoskeletal: Positive for joint pain and myalgias.  Skin: Negative.   Neurological: Positive for tremors, focal weakness and weakness.  Endo/Heme/Allergies: Negative.   Psychiatric/Behavioral: Positive for depression.   Blood pressure 116/84, pulse (!) 109, temperature 97.7 F (36.5 C), temperature source Oral, resp. rate 20, height 5' 6"  (1.676 m), weight 130 lb (59 kg), SpO2 100 %. Physical Exam  Nursing note and vitals reviewed. Constitutional: She appears well-developed and well-nourished. She appears listless.  HENT:  Head: Normocephalic and atraumatic.  Eyes: Conjunctivae and EOM are normal. Pupils are equal, round, and reactive to light.  Neck: Normal range of motion. Neck supple.  Cardiovascular: Normal rate, regular rhythm and normal heart sounds.  Respiratory: Breath sounds normal. She is in respiratory distress.  GI: Soft.  Bowel sounds are normal.  Musculoskeletal: Normal range of motion.  Neurological: She appears listless. She is disoriented. She displays tremor.  Skin: Skin is warm and dry.  Psychiatric: She has a normal mood and affect.    Assessment/Plan: Respiratory arrest Acute on chronic renal failure COPD Coronary artery disease Hypoxemia Diabetes Atrial fibrillation Acute on chronic congestive heart failure Parkinson's disease Possible aspiration pneumonia Tremors . PLAN Acute hypoxic respiratory failure recommend antibiotic therapy supplemental oxygen Recommend inhalers as necessary Agree with broad-spectrum antibiotic therapy for possible pneumonia Stage IV renal insufficiency follow-up with nephrology Continue Parkinson's disease management care Agree with DVT prophylaxis Consider rate control for A. fib if necessary Do not recommend anticoagulation at this stage Continue diabetes management  Lettie Czarnecki D Md Smola 09/30/2017, 10:35 PM

## 2017-09-30 NOTE — Progress Notes (Signed)
PT Cancellation Note  Patient Details Name: Molly Wolf MRN: [id number] DOB: Feb 01, 1941   Cancelled Treatment:    Reason Eval/Treat Not Completed: Fatigue/lethargy limiting ability to participate. Treatment attempted; pt sleeping soundly and unable to fully awaken for participation. Re attempt at a later time.   Scot DockHeidi E Leydy Worthey, PTA 09/30/2017, 4:07 PM

## 2017-09-30 NOTE — Progress Notes (Signed)
Nutrition Follow Up Note   DOCUMENTATION CODES:   Severe malnutrition in context of chronic illness  INTERVENTION:  Recommend recheck Mg and P labs as pt at high refeeding risk  Nepro Shake po BID, each supplement provides 425 kcal and 19 grams protein  Dysphagia 1 diet  Vitamin C 500 mg BID as pt noted to have Scurvy in October   MVI twice weekly  Renal MV daily   Bowel regimen as needed   NUTRITION DIAGNOSIS:   Severe Malnutrition related to chronic illness(CKD IV, parkinsons, CHF, Scurvy ) as evidenced by energy intake < or equal to 75% for > or equal to 1 month, mild fat depletion, moderate fat depletion.  GOAL:   Patient will meet greater than or equal to 90% of their needs  -not meeting   MONITOR:   PO intake, Supplement acceptance, Labs, Weight trends, Skin, I & O's  ASSESSMENT:   76 y.o. white female with h/o scurvy, hypertension, hyperlipidemia, Parkinson's, DVT, Diabetes mellitus type II, Depression, Coronary artery disease, COPD, Anxiety, Osteoarthritis admitted for PNA and CHF   Pt initially eating well for the past two days but appetite and oral intake decreased today. Per MD note, pt with increased respiratory distress. Recommend check P and Mg labs as pt at high risk for refeeding and pt with increased needs from cellular respiration. No new weight since 12/15; recommend obtain new weight. Palliative following.   Medications reviewed and include: Augmentin, lasix, insulin, solu- medrol, MVI, protonix, vitamin C  Labs reviewed: K 5.0 wnl, Cl 113(H), BUN 64(H), creat 2.45(H), Ca 8.5(L) P 4.3 wnl, Mg 1.8 wnl- 12/18 BNP 945(H)- 12/15 cbgs- 170, 142 x 24 hrs AIC 6.8(H)- 10/25 Ascorbic acid- 0.1mg /dL- 96-0410-27 iPTH- 54(U91(H)- 9/819/22  Diet Order:  DIET - DYS 1 Room service appropriate? Yes with Assist; Fluid consistency: Thin  EDUCATION NEEDS:   Education needs have been addressed  Skin:  Reviewed RN Assessment  Last BM:  12/16- type 7  Height:   Ht  Readings from Last 1 Encounters:  09/25/17 5\' 6"  (1.676 m)    Weight:   Wt Readings from Last 1 Encounters:  09/25/17 130 lb (59 kg)    Ideal Body Weight:  59 kg  BMI:  Body mass index is 20.98 kg/m.  Estimated Nutritional Needs:   Kcal:  1400-1600kca/day   Protein:  76-88g/day   Fluid:  >1.4L/day   Betsey Holidayasey Kanav Kazmierczak MS, RD, LDN Pager #[phone]- [phone] After Hours Pager: [phone]

## 2017-09-30 NOTE — Progress Notes (Signed)
[facility] [place] [facility]   PATIENT NAME: Molly CunasJudy Wolf    MR#:  [id number]  DATE OF BIRTH:  [birth year]    CHIEF COMPLAINT:   Chief Complaint  Patient presents with  . Respiratory Arrest   Awake and alert. Less alert today. Mild resp distress  REVIEW OF SYSTEMS:   Review of Systems  Unable to perform ROS: Mental status change   DRUG ALLERGIES:   Allergies  Allergen Reactions  . Prednisone Other (See Comments)    halluciantions    VITALS:  Blood pressure 106/66, pulse 92, temperature 97.9 F (36.6 C), temperature source Oral, resp. rate 18, height 5\' 6"  (1.676 m), weight 59 kg (130 lb), SpO2 99 %.  PHYSICAL EXAMINATION:  GENERAL:  76 y.o.-year-old patient lying in the bed with no acute distress.  Patient has continued shaking of hands and head secondary to Parkinson disease. EYES: Pupils equal, round, reactive to light and accommodation. No scleral icterus. Extraocular muscles intact. HEENT: Head atraumatic, normocephalic. Oropharynx and nasopharynx clear.  NECK:  Supple, no jugular venous distention. No thyroid enlargement, no tenderness.  LUNGS: Decreased breath sound bilaterally. rales CARDIOVASCULAR: S1, S2 normal. No murmurs, rubs, or gallops.  ABDOMEN: Soft, nontender, nondistended. Bowel sounds present. No organomegaly or mass. EXTREMITIES: No pedal edema, cyanosis, or clubbing. NEUROLOGIC:  Decreased muscle strength overall . tremors PSYCHIATRIC: The patient is drowzy. SKIN: No obvious rash, lesion, or ulcer.   LABORATORY PANEL:   CBC Recent Labs  Lab 09/27/17 0439  WBC 5.6  HGB 11.8*  HCT 38.3  PLT 105*   ------------------------------------------------------------------------------------------------------------------  Chemistries  Recent Labs  Lab 09/25/17 1545  09/28/17 0429  09/30/17 0419  NA 137   < > 140   < > 139  K 5.2*   < > 5.0   < > 5.0  CL 110   < > 113*   < > 113*  CO2 22   < > 20*   < > 19*   GLUCOSE 343*   < > 115*   < > 143*  BUN 50*   < > 55*   < > 64*  CREATININE 2.06*   < > 2.58*   < > 2.45*  CALCIUM 8.8*   < > 8.3*   < > 8.5*  MG  --    < > 1.8  --   --   AST 19  --   --   --   --   ALT <5*  --   --   --   --   ALKPHOS 63  --   --   --   --   BILITOT 1.0  --   --   --   --    < > = values in this interval not displayed.   ------------------------------------------------------------------------------------------------------------------  Cardiac Enzymes Recent Labs  Lab 09/26/17 0212  TROPONINI <0.03   ------------------------------------------------------------------------------------------------------------------  RADIOLOGY:  No results found.  EKG:   Orders placed or performed during the hospital encounter of 09/25/17  . ED EKG  . ED EKG  . EKG 12-Lead  . EKG 12-Lead    ASSESSMENT AND PLAN:  *Acute hypoxic and hypercarbic respiratory failure.  Was on BiPAP.  Now on nasal cannula. Worsening respiration Check STAT cxr. Scheduled nebs Solumedrol 60mg  IV x 1  * Left-sided aspiration pneumonia. On augmentin. afebrile  * AKI over CKD stage IV - worsening today On gentle IV hydration with history of CHF Was recently on HD  temporarily  * Chronic systolic heart failure with EF 35% Some crackles on exam Will wait for CXR  * Severe Parkinson disease  * Diabetes mellitus type 2. Sliding scale insulin  All the records are reviewed and case discussed with Care Management/Social Worker. Management plans discussed with the patient, family and they are in agreement.  CODE STATUS: DNR  TOTAL TIME TAKING CARE OF THIS PATIENT: 35 minutes.   POSSIBLE D/C IN 1-2 DAYS, DEPENDING ON CLINICAL CONDITION.  Molinda BailiffSrikar R Kaziyah Parkison M.D on 09/30/2017 at 2:17 PM  Between 7am to 6pm - Pager - [phone]  After 6pm go to [email or web address] - password [email or web address] [email or web address]  Office  [phone]  CC: Primary care physician; Lauro RegulusAnderson, Marshall W,  MD   Note: This dictation was prepared with Dragon dictation along with smaller phrase technology. Any transcriptional errors that result from this process are unintentional.

## 2017-09-30 NOTE — Progress Notes (Signed)
Prime doctor notified that pt.'s bladder scan was 319 ml after lasix was given at 1456 09/30/2017 and pt has not voided since 1400 . No orders to place foley in at this time. New orders, If next bladder scan shows >500 ml then foley needs to be placed for pt. MD aware that pt is still lethargic, alert to self and responds to voice, and VSS. Will continue to monitor pt.  Molly Wolf [place]

## 2017-09-30 NOTE — Progress Notes (Signed)
Daily Progress Note   Patient Name: Molly CunasJudy Wisniewski       Date: 09/30/2017 DOB: Nov 06, 1940  Age: 76 y.o. MRN#: [id number] Attending Physician: Milagros LollSudini, Srikar, MD Primary Care Physician: Lauro RegulusAnderson, Marshall W, MD Admit Date: 09/25/2017  Reason for Consultation/Follow-up: Establishing goals of care  Subjective:  Pt noted to have worsening respiratory and mental status changes today. Chest xray shows bilateral pleural effusions and persistent consolidation in the left lung. Planned for thoracentesis today. Cr improving. Again discussed GOC with HCPOA- Seth. He notes that after sitting with her yesterday, he realizes she is getting worse. He would not want her to have dialysis. We discussed that continuing aggressive medical care would make it more difficult to reach goal of bringing her home to spend time with family and her dog.  He agreed. Would like palliative thoracentesis and then proceed home with Hospice.   Review of Systems  Unable to perform ROS: Mental status change    Length of Stay: 5  Current Medications: Scheduled Meds:  . amoxicillin-clavulanate  500 mg of amoxicillin Oral Q12H  . diltiazem  30 mg Oral Q8H  . feeding supplement (NEPRO CARB STEADY)  237 mL Oral BID BM  . insulin aspart  0-15 Units Subcutaneous Q6H  . ipratropium-albuterol  3 mL Nebulization Q6H  . LORazepam  0.5 mg Oral QHS  . metoprolol tartrate  25 mg Oral BID  . multivitamin  1 tablet Oral QHS  . multivitamin with minerals  1 tablet Oral Once per day on Mon Thu  . pantoprazole (PROTONIX) IV  40 mg Intravenous Q24H  . sulfacetamide  1 drop Left Eye Q4H  . vitamin C  500 mg Oral BID    Continuous Infusions:   PRN Meds: morphine injection, nitroGLYCERIN, [DISCONTINUED] ondansetron **OR** ondansetron  (ZOFRAN) IV, polyethylene glycol  Physical Exam  Constitutional: No distress.  Cardiovascular: Normal rate.  Pulmonary/Chest: Effort normal.  Abdominal: Soft.  Neurological:  Lethargic- does not wake to voice or stimuli  Skin: There is pallor.  Nursing note and vitals reviewed.           Vital Signs: BP 106/66 (BP Location: Right Arm)   Pulse 92   Temp 97.9 F (36.6 C) (Oral)   Resp 18   Ht 5\' 6"  (1.676 m)   Wt 59 kg (130 lb)  SpO2 99%   BMI 20.98 kg/m  SpO2: SpO2: 99 % O2 Device: O2 Device: Nasal Cannula O2 Flow Rate: O2 Flow Rate (L/min): 2 L/min  Intake/output summary:   Intake/Output Summary (Last 24 hours) at 09/30/2017 1630 Last data filed at 09/30/2017 1400 Gross per 24 hour  Intake 260 ml  Output 200 ml  Net 60 ml   LBM: Last BM Date: (pt does not remember ) Baseline Weight: Weight: 59 kg (130 lb) Most recent weight: Weight: 59 kg (130 lb)       Palliative Assessment/Data: PPS: 20%    Flowsheet Rows     Most Recent Value  Intake Tab  Referral Department  Hospitalist  Unit at Time of Referral  Med/Surg Unit  Palliative Care Primary Diagnosis  Nephrology  Date Notified  09/26/17  Palliative Care Type  Return patient Palliative Care  Reason for referral  Clarify Goals of Care  Date of Admission  09/25/17  Date first seen by Palliative Care  09/28/17  # of days Palliative referral response time  2 Day(s)  # of days IP prior to Palliative referral  1  Clinical Assessment  Psychosocial & Spiritual Assessment  Palliative Care Outcomes      Patient Active Problem List   Diagnosis Date Noted  . Protein-calorie malnutrition, severe 09/30/2017  . Parkinson's disease (HCC)   . Advance care planning   . Palliative care by specialist   . Acute respiratory failure (HCC) 09/25/2017  . Acute kidney injury (HCC) 08/05/2017  . PNA (pneumonia) 07/02/2017  . DVT (deep venous thrombosis) (HCC) 05/21/2017  . Confusion   . Goals of care,  counseling/discussion   . Acute renal failure with acute tubular necrosis superimposed on stage 4 chronic kidney disease (HCC)   . Palliative care encounter   . Coronary artery disease of native artery of native heart with stable angina pectoris (HCC) 03/31/2017  . Persistent atrial fibrillation (HCC) 03/31/2017  . Chronic combined systolic and diastolic heart failure (HCC) 02/26/2017  . HTN (hypertension) 02/26/2017  . Parkinson disease (HCC) 02/26/2017  . Intractable pain 12/27/2016    Palliative Care Assessment & Plan   Patient Profile: 76 y.o. female  with past medical history of Parkinson's dementia, CKD IV, CHF (35%), COPD, A.Fib, depression admitted on 09/25/2017 with respiratory distress post aspiration event. During admission she required Bipap, but vomited into the bipap. She was made DNR per attending MD.  Decision was made to proceed with IV antibiotics and fluids over the weekend and then readdress GOC. Palliative medicine consulted for continued GOC.   Assessment/Recommendations/Plan   Palliative thoracentesis  No dialysis  D/C home with Hospice  Morphine 2.5mg  q1hr prn SOB  Goals of Care and Additional Recommendations:  Limitations on Scope of Treatment: Avoid Hospitalization  Code Status:  DNR  Prognosis:   < 4 weeks due to worsening CHF, pleural effusions, Parkinson's, dysphagia with recurrent aspiration pneumonias  Discharge Planning:  Home with Hospice  [facility].  Thank you for allowing the Palliative Medicine Team to assist in the care of this patient.   Time In: 1600 Time Out: 1635 Total Time 35 mins Prolonged Time Billed no      Greater than 50%  of this time was spent counseling and coordinating care related to the above assessment and plan.  Ocie BobKasie Janaysha Depaulo, AGNP-C Palliative Medicine   Please contact Palliative Medicine Team phone at [phone] for questions and concerns.

## 2017-09-30 NOTE — Progress Notes (Signed)
RN notified ultrasound to see if thoracentesis will be performed tonight, nurse stated "that thoracentesis will not be done tonight but in the morning." Prime doctor of thoracentesis being done tomorrow morning 10/01/2017.   Molly Wolf [place]

## 2017-10-01 ENCOUNTER — Inpatient Hospital Stay: Payer: Medicare Other

## 2017-10-01 LAB — BODY FLUID CELL COUNT WITH DIFFERENTIAL
Eos, Fluid: 0 %
Lymphs, Fluid: 16 %
Monocyte-Macrophage-Serous Fluid: 8 %
Neutrophil Count, Fluid: 76 %
WBC FLUID: 218 uL

## 2017-10-01 LAB — CBC WITH DIFFERENTIAL/PLATELET
Basophils Absolute: 0 10*3/uL (ref 0–0.1)
Basophils Relative: 0 %
Eosinophils Absolute: 0 10*3/uL (ref 0–0.7)
Eosinophils Relative: 0 %
HEMATOCRIT: 43.8 % (ref 35.0–47.0)
HEMOGLOBIN: 13.9 g/dL (ref 12.0–16.0)
LYMPHS ABS: 0.3 10*3/uL — AB (ref 1.0–3.6)
Lymphocytes Relative: 12 %
MCH: 31.8 pg (ref 26.0–34.0)
MCHC: 31.7 g/dL — AB (ref 32.0–36.0)
MCV: 100.6 fL — ABNORMAL HIGH (ref 80.0–100.0)
MONOS PCT: 3 %
Monocytes Absolute: 0.1 10*3/uL — ABNORMAL LOW (ref 0.2–0.9)
NEUTROS ABS: 2.1 10*3/uL (ref 1.4–6.5)
NEUTROS PCT: 85 %
Platelets: 143 10*3/uL — ABNORMAL LOW (ref 150–440)
RBC: 4.35 MIL/uL (ref 3.80–5.20)
RDW: 18.2 % — ABNORMAL HIGH (ref 11.5–14.5)
WBC: 2.5 10*3/uL — ABNORMAL LOW (ref 3.6–11.0)

## 2017-10-01 LAB — BASIC METABOLIC PANEL
ANION GAP: 10 (ref 5–15)
BUN: 65 mg/dL — ABNORMAL HIGH (ref 6–20)
CHLORIDE: 111 mmol/L (ref 101–111)
CO2: 17 mmol/L — AB (ref 22–32)
CREATININE: 2.57 mg/dL — AB (ref 0.44–1.00)
Calcium: 8.3 mg/dL — ABNORMAL LOW (ref 8.9–10.3)
GFR calc non Af Amer: 17 mL/min — ABNORMAL LOW (ref >60)
GFR, EST AFRICAN AMERICAN: 20 mL/min — AB (ref >60)
Glucose, Bld: 163 mg/dL — ABNORMAL HIGH (ref 65–99)
Potassium: 5.5 mmol/L — ABNORMAL HIGH (ref 3.5–5.1)
Sodium: 138 mmol/L (ref 135–145)

## 2017-10-01 LAB — PROTEIN, PLEURAL OR PERITONEAL FLUID

## 2017-10-01 LAB — MAGNESIUM: Magnesium: 1.8 mg/dL (ref 1.7–2.4)

## 2017-10-01 LAB — GLUCOSE, CAPILLARY
GLUCOSE-CAPILLARY: 147 mg/dL — AB (ref 65–99)
Glucose-Capillary: 162 mg/dL — ABNORMAL HIGH (ref 65–99)
Glucose-Capillary: 204 mg/dL — ABNORMAL HIGH (ref 65–99)
Glucose-Capillary: 234 mg/dL — ABNORMAL HIGH (ref 65–99)

## 2017-10-01 LAB — PHOSPHORUS: Phosphorus: 4.8 mg/dL — ABNORMAL HIGH (ref 2.5–4.6)

## 2017-10-01 LAB — GLUCOSE, PLEURAL OR PERITONEAL FLUID: Glucose, Fluid: 175 mg/dL

## 2017-10-01 MED ORDER — MORPHINE SULFATE (PF) 2 MG/ML IV SOLN
2.0000 mg | INTRAVENOUS | Status: DC | PRN
  Administered 2017-10-02 – 2017-10-03 (×2): 2 mg via INTRAVENOUS
  Filled 2017-10-01 (×2): qty 1

## 2017-10-01 MED ORDER — FUROSEMIDE 10 MG/ML IJ SOLN
60.0000 mg | Freq: Once | INTRAMUSCULAR | Status: AC
  Administered 2017-10-01: 60 mg via INTRAVENOUS
  Filled 2017-10-01: qty 8

## 2017-10-01 MED ORDER — TRAMADOL HCL 50 MG PO TABS: 50.0000 mg | ORAL_TABLET | Freq: Four times a day (QID) | ORAL | Status: DC | PRN

## 2017-10-01 MED ORDER — PANTOPRAZOLE SODIUM 40 MG PO TBEC: 40.0000 mg | DELAYED_RELEASE_TABLET | Freq: Every day | ORAL | Status: DC

## 2017-10-01 MED ORDER — ACETAMINOPHEN 325 MG PO TABS
650.0000 mg | ORAL_TABLET | Freq: Four times a day (QID) | ORAL | Status: DC | PRN
  Administered 2017-10-01: 650 mg via ORAL
  Filled 2017-10-01: qty 2

## 2017-10-01 MED ORDER — GABAPENTIN 100 MG PO CAPS
200.0000 mg | ORAL_CAPSULE | Freq: Two times a day (BID) | ORAL | Status: DC
  Administered 2017-10-01: 200 mg via ORAL
  Filled 2017-10-01: qty 2

## 2017-10-01 MED ORDER — IPRATROPIUM-ALBUTEROL 0.5-2.5 (3) MG/3ML IN SOLN
3.0000 mL | Freq: Four times a day (QID) | RESPIRATORY_TRACT | Status: DC | PRN
  Administered 2017-10-03: 3 mL via RESPIRATORY_TRACT
  Filled 2017-10-01: qty 3

## 2017-10-01 MED ORDER — IPRATROPIUM-ALBUTEROL 0.5-2.5 (3) MG/3ML IN SOLN
3.0000 mL | Freq: Three times a day (TID) | RESPIRATORY_TRACT | Status: DC
  Administered 2017-10-01 – 2017-10-04 (×7): 3 mL via RESPIRATORY_TRACT
  Filled 2017-10-01 (×5): qty 3
  Filled 2017-10-01: qty 39
  Filled 2017-10-01: qty 3

## 2017-10-01 MED ORDER — MORPHINE SULFATE (CONCENTRATE) 10 MG/0.5ML PO SOLN
2.5000 mg | ORAL | Status: DC | PRN
  Administered 2017-10-01 – 2017-10-03 (×2): 2.6 mg via SUBLINGUAL
  Filled 2017-10-01 (×2): qty 1

## 2017-10-01 MED ORDER — LORAZEPAM 0.5 MG PO TABS
0.5000 mg | ORAL_TABLET | Freq: Four times a day (QID) | ORAL | Status: DC | PRN
  Administered 2017-10-03 (×2): 0.5 mg via ORAL
  Filled 2017-10-01 (×2): qty 1

## 2017-10-01 MED ORDER — FUROSEMIDE 40 MG PO TABS
40.0000 mg | ORAL_TABLET | Freq: Every day | ORAL | Status: DC
  Administered 2017-10-02 – 2017-10-03 (×2): 40 mg via ORAL
  Filled 2017-10-01 (×2): qty 1

## 2017-10-01 NOTE — Procedures (Signed)
Left pleural eff  S/p US LEFT THORACENTESIS  No comp 825cc removed Labs sent Full report inpacs

## 2017-10-01 NOTE — Care Management Important Message (Signed)
Important Message  Patient Details  Name: Molly Wolf MRN: [id number] Date of Birth: 1941/08/12   Medicare Important Message Given:  Yes    Chapman FitchBOWEN, Ashiya Kinkead T, RN 10/01/2017, 3:07 PM

## 2017-10-01 NOTE — Progress Notes (Signed)
PT Cancellation Note  Patient Details Name: Molly Wolf MRN: [id number] DOB: [birth year]   Cancelled Treatment:    Reason Eval/Treat Not Completed: Patient declined, no reason specified Pt asleep easily awoken, pt shaking head vehemently and was able to verbalize "I want to sleep". Pt remained in bed with needs met.     Thessaly Mccullers  Delenn Ahn, PTA 10/01/2017, 2:58 PM

## 2017-10-01 NOTE — Progress Notes (Signed)
Follow up on new palliative medicine consult at discharge who now wishes to discharge home with hospice services.  Will need BSC, oxygen and hospital bed at discharge.  Updated information faxed to the office.  Pending discharge home tomorrow.    Norris CrossKara H. Marshall ,RN

## 2017-10-01 NOTE — Progress Notes (Signed)
Pt w/ reduced EF HF, not on an ACE inhibitor due to renal fx. Pt is to d/c w/ hospice.  Olene FlossMelissa D Destenie Ingber, Pharm.D, BCPS Clinical Pharmacist

## 2017-10-01 NOTE — Progress Notes (Signed)
[facility]  ROUNDING NOTE   Subjective:   Granddaughter at bedside.   Furosemide 60mg  IV x 1 given this morning.   Objective:  Vital signs in last 24 hours:  Temp:  [97.5 F (36.4 C)-98.5 F (36.9 C)] 97.7 F (36.5 C) (12/21 1316) Pulse Rate:  [88-114] 88 (12/21 1316) Resp:  [15-20] 20 (12/21 1316) BP: (100-131)/(65-84) 105/65 (12/21 1316) SpO2:  [97 %-100 %] 97 % (12/21 1316)  Weight change:  Filed Weights   09/25/17 1549  Weight: 59 kg (130 lb)    Intake/Output: I/O last 3 completed shifts: In: 60 [P.O.:60] Out: 150 [Urine:150]   Intake/Output this shift:  Total I/O In: -  Out: 200 [Urine:200]  Physical Exam: General: NAD, laying in bed  Head: Normocephalic, atraumatic. Moist oral mucosal membranes  Eyes: Anicteric, PERRL  Neck: Supple, trachea midline  Lungs:  Clear to auscultation  Heart: irregular  Abdomen:  Soft, nontender  Extremities: no peripheral edema.  Neurologic: Alert to self only, +tremor  Skin: No lesions  Access: none    Basic Metabolic Panel: Recent Labs  Lab 09/25/17 2133  09/27/17 0439 09/28/17 0429 09/29/17 0613 09/30/17 0419 10/01/17 0433  NA 139   < > 139 140 138 139 138  K 4.5   < > 5.2* 5.0 5.3* 5.0 5.5*  CL 111   < > 113* 113* 112* 113* 111  CO2 21*   < > 20* 20* 19* 19* 17*  GLUCOSE 346*   < > 195* 115* 170* 143* 163*  BUN 50*   < > 52* 55* 60* 64* 65*  CREATININE 1.94*   < > 2.39* 2.58* 2.73* 2.45* 2.57*  CALCIUM 8.0*   < > 8.2* 8.3* 8.6* 8.5* 8.3*  MG 1.7  --   --  1.8  --   --  1.8  PHOS 4.4  --   --  4.3  --   --  4.8*   < > = values in this interval not displayed.    Liver Function Tests: Recent Labs  Lab 09/25/17 1545  AST 19  ALT <5*  ALKPHOS 63  BILITOT 1.0  PROT 6.2*  ALBUMIN 3.3*   No results for input(s): LIPASE, AMYLASE in the last 168 hours. No results for input(s): AMMONIA in the last 168 hours.  CBC: Recent Labs  Lab 09/25/17 1545 09/25/17 2133 09/26/17 0212  09/27/17 0439 10/01/17 0433  WBC 9.4  --  7.2 5.6 2.5*  NEUTROABS 7.2*  --   --   --  2.1  HGB 13.1 12.4 12.2 11.8* 13.9  HCT 43.0 40.7 39.6 38.3 43.8  MCV 103.0*  --  103.0* 103.0* 100.6*  PLT 169  --  106* 105* 143*    Cardiac Enzymes: Recent Labs  Lab 09/25/17 1545 09/25/17 2133 09/26/17 0212  TROPONINI <0.03 <0.03 <0.03    BNP: Invalid input(s): POCBNP  CBG: Recent Labs  Lab 09/30/17 1141 09/30/17 1730 10/01/17 0000 10/01/17 0511 10/01/17 1158  GLUCAP 126* 110* 162* 147* 204*    Microbiology: Results for orders placed or performed during the hospital encounter of 09/25/17  Culture, blood (routine x 2)     Status: None   Collection Time: 09/25/17  3:45 PM  Result Value Ref Range Status   Specimen Description BLOOD RIGHT ANTECUBITAL  Final   Special Requests   Final    BOTTLES DRAWN AEROBIC AND ANAEROBIC Blood Culture adequate volume   Culture NO GROWTH 5 DAYS  Final   Report  Status 09/30/2017 FINAL  Final  Culture, blood (routine x 2)     Status: None   Collection Time: 09/25/17  4:03 PM  Result Value Ref Range Status   Specimen Description BLOOD BLOOD LEFT FOREARM  Final   Special Requests   Final    BOTTLES DRAWN AEROBIC AND ANAEROBIC Blood Culture results may not be optimal due to an inadequate volume of blood received in culture bottles   Culture NO GROWTH 5 DAYS  Final   Report Status 09/30/2017 FINAL  Final  CULTURE, BLOOD (ROUTINE X 2) w Reflex to ID Panel     Status: None   Collection Time: 09/25/17  7:26 PM  Result Value Ref Range Status   Specimen Description BLOOD RIGHT HAND  Final   Special Requests   Final    BOTTLES DRAWN AEROBIC AND ANAEROBIC Blood Culture adequate volume   Culture NO GROWTH 5 DAYS  Final   Report Status 09/30/2017 FINAL  Final  CULTURE, BLOOD (ROUTINE X 2) w Reflex to ID Panel     Status: None   Collection Time: 09/25/17  7:39 PM  Result Value Ref Range Status   Specimen Description BLOOD LEFT ANTECUBITAL  Final    Special Requests   Final    BOTTLES DRAWN AEROBIC AND ANAEROBIC Blood Culture adequate volume   Culture NO GROWTH 5 DAYS  Final   Report Status 09/30/2017 FINAL  Final  Urine culture     Status: None   Collection Time: 09/25/17  7:41 PM  Result Value Ref Range Status   Specimen Description URINE, RANDOM  Final   Special Requests NONE  Final   Culture   Final    NO GROWTH Performed at [facility], [address] [place], [place] [address]    Report Status 09/27/2017 FINAL  Final  MRSA PCR Screening     Status: None   Collection Time: 09/25/17  7:41 PM  Result Value Ref Range Status   MRSA by PCR NEGATIVE NEGATIVE Final    Comment:        The GeneXpert MRSA Assay (FDA approved for NASAL specimens only), is one component of a comprehensive MRSA colonization surveillance program. It is not intended to diagnose MRSA infection nor to guide or monitor treatment for MRSA infections.   Gastrointestinal Panel by PCR , Stool     Status: None   Collection Time: 09/25/17  7:41 PM  Result Value Ref Range Status   Campylobacter species NOT DETECTED NOT DETECTED Final   Plesimonas shigelloides NOT DETECTED NOT DETECTED Final   Salmonella species NOT DETECTED NOT DETECTED Final   Yersinia enterocolitica NOT DETECTED NOT DETECTED Final   Vibrio species NOT DETECTED NOT DETECTED Final   Vibrio cholerae NOT DETECTED NOT DETECTED Final   Enteroaggregative E coli (EAEC) NOT DETECTED NOT DETECTED Final   Enteropathogenic E coli (EPEC) NOT DETECTED NOT DETECTED Final   Enterotoxigenic E coli (ETEC) NOT DETECTED NOT DETECTED Final   Shiga like toxin producing E coli (STEC) NOT DETECTED NOT DETECTED Final   Shigella/Enteroinvasive E coli (EIEC) NOT DETECTED NOT DETECTED Final   Cryptosporidium NOT DETECTED NOT DETECTED Final   Cyclospora cayetanensis NOT DETECTED NOT DETECTED Final   Entamoeba histolytica NOT DETECTED NOT DETECTED Final   Giardia lamblia NOT DETECTED NOT DETECTED Final    Adenovirus F40/41 NOT DETECTED NOT DETECTED Final   Astrovirus NOT DETECTED NOT DETECTED Final   Norovirus GI/GII NOT DETECTED NOT DETECTED Final   Rotavirus  A NOT DETECTED NOT DETECTED Final   Sapovirus (I, II, IV, and V) NOT DETECTED NOT DETECTED Final  C difficile quick scan w PCR reflex     Status: None   Collection Time: 09/25/17  7:41 PM  Result Value Ref Range Status   C Diff antigen NEGATIVE NEGATIVE Final   C Diff toxin NEGATIVE NEGATIVE Final   C Diff interpretation No C. difficile detected.  Final    Coagulation Studies: No results for input(s): LABPROT, INR in the last 72 hours.  Urinalysis: No results for input(s): COLORURINE, LABSPEC, PHURINE, GLUCOSEU, HGBUR, BILIRUBINUR, KETONESUR, PROTEINUR, UROBILINOGEN, NITRITE, LEUKOCYTESUR in the last 72 hours.  Invalid input(s): APPERANCEUR    Imaging: Dg Chest 1 View  Result Date: 10/01/2017 CLINICAL DATA:  Post left thoracentesis. EXAM: CHEST 1 VIEW COMPARISON:  09/30/2017, 09/25/2017 and earlier. FINDINGS: Marked reduction in volume of the left pleural effusion post thoracentesis. No evidence of the pneumothorax. Improved aeration in the lingula and left lower lobe, with mild atelectasis persisting. Stable small right pleural effusion and associated mild passive atelectasis in the right lower lobe. No new pulmonary parenchymal abnormalities. Stable marked cardiomegaly. Pulmonary venous hypertension without overt edema. IMPRESSION: 1. No pneumothorax after left thoracentesis. 2. Marked reduction in volume of the left pleural effusion. Improved aeration in the lingula and left lower lobe with mild atelectasis persisting. 3. Stable small right pleural effusion and associated mild passive atelectasis in the right lower lobe. 4. No new pulmonary parenchymal abnormalities. Electronically Signed   By: Hulan Saashomas  Lawrence M.D.   On: 10/01/2017 09:55   Dg Chest 2 View  Result Date: 09/30/2017 CLINICAL DATA:  Pleural effusion EXAM: CHEST  2  VIEW COMPARISON:  09/25/2017, 10/27/ 2018 FINDINGS: Small right pleural effusion. Moderate left pleural effusion, increased compared to prior radiograph. Enlarged cardiomediastinal silhouette with aortic atherosclerosis. Progressive consolidation in the left lower lung. No pneumothorax. IMPRESSION: 1. Bilateral pleural effusion, small on the right and moderate on the left, findings are increased on the left side compared to prior radiograph 2. Persistent consolidation in the lingula and left lung base 3. Cardiomegaly with vascular congestion Electronically Signed   By: Jasmine PangKim  Fujinaga M.D.   On: 09/30/2017 14:40   [place] Thoracentesis Asp Pleural Space W/img Guide  Result Date: 10/01/2017 INDICATION: Left pleural effusion EXAM: ULTRASOUND GUIDED LEFT THORACENTESIS MEDICATIONS: 1% lidocaine local COMPLICATIONS: None immediate. PROCEDURE: An ultrasound guided thoracentesis was thoroughly discussed with the patient's grandson and questions answered. The benefits, risks, alternatives and complications were also discussed. The patient understands and wishes to proceed with the procedure. Written consent was obtained. Ultrasound was performed to localize and mark an adequate pocket of fluid in the left chest. The area was then prepped and draped in the normal sterile fashion. 1% Lidocaine was used for local anesthesia. Under ultrasound guidance a 6 Fr Safe-T-Centesis catheter was introduced. Thoracentesis was performed. The catheter was removed and a dressing applied. FINDINGS: A total of approximately 825 cc of clear pleural fluid was removed. Samples were sent to the laboratory as requested by the clinical team. IMPRESSION: Successful ultrasound guided left thoracentesis yielding 825 cc of pleural fluid. Electronically Signed   By: Judie PetitM.  Shick M.D.   On: 10/01/2017 09:52     Medications:    . amoxicillin-clavulanate  500 mg of amoxicillin Oral Q12H  . feeding supplement (NEPRO CARB STEADY)  237 mL Oral BID BM  .  insulin aspart  0-15 Units Subcutaneous Q6H  . ipratropium-albuterol  3 mL Nebulization Q6H  .  metoprolol tartrate  25 mg Oral BID  . sulfacetamide  1 drop Left Eye Q4H   acetaminophen, LORazepam, morphine injection, morphine CONCENTRATE, nitroGLYCERIN, [DISCONTINUED] ondansetron **OR** ondansetron (ZOFRAN) IV, polyethylene glycol  Assessment/ Plan:  Ms. Renella CunasJudy Sica is a 76 y.o. white female with hypertension, hyperlipidemia, Parkinson's, DVT, Diabetes mellitus type II, Depression, Coronary artery disease, COPD, Anxiety, Osteoarthritis   1.  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 GFR of 17-25. Creatinine does fluctuate with volume status.  Required hemodialysis in the past.  Monitor volume status closely.  - low potassium diet.   2.  Acute respiratory failure secondary to pneumonia. On 2 L [place] Oxygen. Afebrile, no leukocytosis -  PO Augmentin - Supportive care.   3. Hypertension with systolic congestive heart failure: EF of 30% on echocardiogram.   Blood pressure at goal.  - restart furosemide 40mg  PO daily  4.  Diabetes mellitus type 2 with chronic kidney disease: Continue glucose control.   Appreciate Palliative care input.    LOS: 6 Yamilka Lopiccolo 12/21/20183:27 PM

## 2017-10-01 NOTE — Care Management (Signed)
Plan for patient to now discharge home with hospice.  Confirmed with grandson he wishes for her to return home with him with hospice.  Provided hospice agency preference. [facility].  [facility]. Notified patient that anticipated discharge 12/22 and aptient would require hospital bed, bsc, and O2.  RNCM following.

## 2017-10-01 NOTE — Progress Notes (Signed)
PHARMACIST - PHYSICIAN COMMUNICATION  DR:   Elpidio AnisSudini  CONCERNING: IV to Oral Route Change Policy  RECOMMENDATION: This patient is receiving protonix by the intravenous route.  Based on criteria approved by the Pharmacy and Therapeutics Committee, the intravenous medication(s) is/are being converted to the equivalent oral dose form(s).   DESCRIPTION: These criteria include:  The patient is eating (either orally or via tube) and/or has been taking other orally administered medications for a least 24 hours  The patient has no evidence of active gastrointestinal bleeding or impaired GI absorption (gastrectomy, short bowel, patient on TNA or NPO).  If you have questions about this conversion, please contact the Pharmacy Department  []   [phone]( [phone] )  [facility] [x]   [phone]( [phone] )  [facility] []   [phone]( [phone] )  [facility] []   [phone]( [phone] )  [facility] []   [phone]( [phone] )  [facility]   Olene FlossMelissa D Ethlyn Alto, [facility] 10/01/2017 11:52 AM

## 2017-10-01 NOTE — Progress Notes (Signed)
[facility]   PATIENT NAME: Molly Wolf    MR#:  [id number]  DATE OF BIRTH:  01/30/1941    CHIEF COMPLAINT:   Chief Complaint  Patient presents with  . Respiratory Arrest   Awake and alert. On 2 l o2  Family at bedside  Confused  REVIEW OF SYSTEMS:   Review of Systems  Unable to perform ROS: Mental status change   DRUG ALLERGIES:   Allergies  Allergen Reactions  . Prednisone Other (See Comments)    halluciantions    VITALS:  Blood pressure 105/65, pulse 88, temperature 97.7 F (36.5 C), temperature source Oral, resp. rate 20, height 5\' 6"  (1.676 m), weight 59 kg (130 lb), SpO2 97 %.  PHYSICAL EXAMINATION:  GENERAL:  76 y.o.-year-old patient lying in the bed with no acute distress.  Patient has continued shaking of hands and head secondary to Parkinson disease. EYES: Pupils equal, round, reactive to light and accommodation. No scleral icterus. Extraocular muscles intact. HEENT: Head atraumatic, normocephalic. Oropharynx and nasopharynx clear.  NECK:  Supple, no jugular venous distention. No thyroid enlargement, no tenderness.  LUNGS: Decreased breath sound bilaterally. rales CARDIOVASCULAR: S1, S2 normal. No murmurs, rubs, or gallops.  ABDOMEN: Soft, nontender, nondistended. Bowel sounds present. No organomegaly or mass. EXTREMITIES: No pedal edema, cyanosis, or clubbing. NEUROLOGIC:  Decreased muscle strength overall . tremors PSYCHIATRIC: The patient is drowzy. SKIN: No obvious rash, lesion, or ulcer.   LABORATORY PANEL:   CBC Recent Labs  Lab 10/01/17 0433  WBC 2.5*  HGB 13.9  HCT 43.8  PLT 143*   ------------------------------------------------------------------------------------------------------------------  Chemistries  Recent Labs  Lab 09/25/17 1545  10/01/17 0433  NA 137   < > 138  K 5.2*   < > 5.5*  CL 110   < > 111  CO2 22   < > 17*  GLUCOSE 343*   < > 163*  BUN 50*   < > 65*   CREATININE 2.06*   < > 2.57*  CALCIUM 8.8*   < > 8.3*  MG  --    < > 1.8  AST 19  --   --   ALT <5*  --   --   ALKPHOS 63  --   --   BILITOT 1.0  --   --    < > = values in this interval not displayed.   ------------------------------------------------------------------------------------------------------------------  Cardiac Enzymes Recent Labs  Lab 09/26/17 0212  TROPONINI <0.03   ------------------------------------------------------------------------------------------------------------------  RADIOLOGY:  Dg Chest 1 View  Result Date: 10/01/2017 CLINICAL DATA:  Post left thoracentesis. EXAM: CHEST 1 VIEW COMPARISON:  09/30/2017, 09/25/2017 and earlier. FINDINGS: Marked reduction in volume of the left pleural effusion post thoracentesis. No evidence of the pneumothorax. Improved aeration in the lingula and left lower lobe, with mild atelectasis persisting. Stable small right pleural effusion and associated mild passive atelectasis in the right lower lobe. No new pulmonary parenchymal abnormalities. Stable marked cardiomegaly. Pulmonary venous hypertension without overt edema. IMPRESSION: 1. No pneumothorax after left thoracentesis. 2. Marked reduction in volume of the left pleural effusion. Improved aeration in the lingula and left lower lobe with mild atelectasis persisting. 3. Stable small right pleural effusion and associated mild passive atelectasis in the right lower lobe. 4. No new pulmonary parenchymal abnormalities. Electronically Signed   By: Hulan Saashomas  Lawrence M.D.   On: 10/01/2017 09:55   Dg Chest 2 View  Result Date: 09/30/2017 CLINICAL DATA:  Pleural effusion EXAM: CHEST  2 VIEW COMPARISON:  09/25/2017, 10/27/ 2018 FINDINGS: Small right pleural effusion. Moderate left pleural effusion, increased compared to prior radiograph. Enlarged cardiomediastinal silhouette with aortic atherosclerosis. Progressive consolidation in the left lower lung. No pneumothorax. IMPRESSION: 1.  Bilateral pleural effusion, small on the right and moderate on the left, findings are increased on the left side compared to prior radiograph 2. Persistent consolidation in the lingula and left lung base 3. Cardiomegaly with vascular congestion Electronically Signed   By: Jasmine PangKim  Fujinaga M.D.   On: 09/30/2017 14:40   [place] Thoracentesis Asp Pleural Space W/img Guide  Result Date: 10/01/2017 INDICATION: Left pleural effusion EXAM: ULTRASOUND GUIDED LEFT THORACENTESIS MEDICATIONS: 1% lidocaine local COMPLICATIONS: None immediate. PROCEDURE: An ultrasound guided thoracentesis was thoroughly discussed with the patient's grandson and questions answered. The benefits, risks, alternatives and complications were also discussed. The patient understands and wishes to proceed with the procedure. Written consent was obtained. Ultrasound was performed to localize and mark an adequate pocket of fluid in the left chest. The area was then prepped and draped in the normal sterile fashion. 1% Lidocaine was used for local anesthesia. Under ultrasound guidance a 6 Fr Safe-T-Centesis catheter was introduced. Thoracentesis was performed. The catheter was removed and a dressing applied. FINDINGS: A total of approximately 825 cc of clear pleural fluid was removed. Samples were sent to the laboratory as requested by the clinical team. IMPRESSION: Successful ultrasound guided left thoracentesis yielding 825 cc of pleural fluid. Electronically Signed   By: Judie PetitM.  Shick M.D.   On: 10/01/2017 09:52    EKG:   Orders placed or performed during the hospital encounter of 09/25/17  . ED EKG  . ED EKG  . EKG 12-Lead  . EKG 12-Lead    ASSESSMENT AND PLAN:  *Acute hypoxic and hypercarbic respiratory failure.  Was on BiPAP.  Now on nasal cannula. S/p left thoracentesis  * Left-sided aspiration pneumonia. On augmentin. Afebrile  * AKI over CKD stage IV  No HD as per discussion with HCPOA grandson Seth by palliative care  * Chronic  systolic heart failure with EF 35% Lasix given earlier today morning  * Severe Parkinson disease  * Diabetes mellitus type 2. Sliding scale insulin  D/C home tomorrow with hospice. Poor prognosis.  CODE STATUS: DNR  TOTAL TIME TAKING CARE OF THIS PATIENT: 35 minutes.   POSSIBLE D/C IN 1-2 DAYS, DEPENDING ON CLINICAL CONDITION.  Molinda BailiffSrikar R Carlin Mamone M.D on 10/01/2017 at 3:38 PM  Between 7am to 6pm - Pager - [phone]  After 6pm go to [email or web address] - password [email or web address] [email or web address]  [facility]  Office  [phone]  CC: Primary care physician; Lauro RegulusAnderson, Marshall W, MD   Note: This dictation was prepared with Dragon dictation along with smaller phrase technology. Any transcriptional errors that result from this process are unintentional.

## 2017-10-02 LAB — GLUCOSE, CAPILLARY
GLUCOSE-CAPILLARY: 191 mg/dL — AB (ref 65–99)
GLUCOSE-CAPILLARY: 259 mg/dL — AB (ref 65–99)
Glucose-Capillary: 186 mg/dL — ABNORMAL HIGH (ref 65–99)

## 2017-10-02 LAB — PROTEIN, BODY FLUID (OTHER): Total Protein, Body Fluid Other: 1.5 g/dL

## 2017-10-02 MED ORDER — MORPHINE SULFATE (CONCENTRATE) 10 MG/0.5ML PO SOLN: 5.0000 mg | ORAL | 0 refills | Status: AC | PRN

## 2017-10-02 MED ORDER — LORAZEPAM 0.5 MG PO TABS: 0.5000 mg | ORAL_TABLET | Freq: Three times a day (TID) | ORAL | 0 refills | Status: AC | PRN

## 2017-10-02 MED ORDER — FUROSEMIDE 40 MG PO TABS: 40.0000 mg | ORAL_TABLET | Freq: Every day | ORAL | 0 refills | Status: AC

## 2017-10-02 NOTE — Progress Notes (Signed)
PT Cancellation Note  Patient Details Name: Molly Wolf MRN: [id number] DOB: 10-Oct-1941   Cancelled Treatment:    Reason Eval/Treat Not Completed: Medical issues which prohibited therapy   Chart reviewed.  Due to lab values outside of therapy limits, including potassium 5.5, session held today.  Will continue as appropriate.   Danielle DessSarah Brittin Belnap 10/02/2017, 8:16 AM

## 2017-10-02 NOTE — Progress Notes (Signed)
Per Dr. Elpidio AnisSudini hold Metoprolol for now and recheck in 1 hour. If systolic greater than 100 give at this time.

## 2017-10-02 NOTE — Progress Notes (Signed)
Per Dr. Elpidio AnisSudini okay to discontinue order for CBGs q 6 hours and sliding scale coverage. As pt will be discharged home with hospice.

## 2017-10-02 NOTE — Progress Notes (Signed)
[facility] [place] [facility]   PATIENT NAME: Molly CunasJudy Marcotte    MR#:  [id number]  DATE OF BIRTH:  Oct 28, 1940    CHIEF COMPLAINT:   Chief Complaint  Patient presents with  . Respiratory Arrest   Awake and alert. On 2 l o2  Family at bedside  LE pain REVIEW OF SYSTEMS:   Review of Systems  Unable to perform ROS: Mental status change   DRUG ALLERGIES:   Allergies  Allergen Reactions  . Prednisone Other (See Comments)    halluciantions    VITALS:  Blood pressure 108/78, pulse 92, temperature (!) 97.3 F (36.3 C), temperature source Oral, resp. rate 16, height 5\' 6"  (1.676 m), weight 59 kg (130 lb), SpO2 100 %.  PHYSICAL EXAMINATION:  GENERAL:  76 y.o.-year-old patient lying in the bed with no acute distress.  Patient has continued shaking of hands and head secondary to Parkinson disease. EYES: Pupils equal, round, reactive to light and accommodation. No scleral icterus. Extraocular muscles intact. HEENT: Head atraumatic, normocephalic. Oropharynx and nasopharynx clear.  NECK:  Supple, no jugular venous distention. No thyroid enlargement, no tenderness.  LUNGS: Decreased breath sound bilaterally. rales CARDIOVASCULAR: S1, S2 normal. No murmurs, rubs, or gallops.  ABDOMEN: Soft, nontender, nondistended. Bowel sounds present. No organomegaly or mass. EXTREMITIES: No pedal edema, cyanosis, or clubbing. NEUROLOGIC:  Decreased muscle strength overall . tremors PSYCHIATRIC: The patient is drowzy. SKIN: No obvious rash, lesion, or ulcer.   LABORATORY PANEL:   CBC Recent Labs  Lab 10/01/17 0433  WBC 2.5*  HGB 13.9  HCT 43.8  PLT 143*   ------------------------------------------------------------------------------------------------------------------  Chemistries  Recent Labs  Lab 09/25/17 1545  10/01/17 0433  NA 137   < > 138  K 5.2*   < > 5.5*  CL 110   < > 111  CO2 22   < > 17*  GLUCOSE 343*   < > 163*  BUN 50*   < > 65*   CREATININE 2.06*   < > 2.57*  CALCIUM 8.8*   < > 8.3*  MG  --    < > 1.8  AST 19  --   --   ALT <5*  --   --   ALKPHOS 63  --   --   BILITOT 1.0  --   --    < > = values in this interval not displayed.   ------------------------------------------------------------------------------------------------------------------  Cardiac Enzymes Recent Labs  Lab 09/26/17 0212  TROPONINI <0.03   ------------------------------------------------------------------------------------------------------------------  RADIOLOGY:  Dg Chest 1 View  Result Date: 10/01/2017 CLINICAL DATA:  Post left thoracentesis. EXAM: CHEST 1 VIEW COMPARISON:  09/30/2017, 09/25/2017 and earlier. FINDINGS: Marked reduction in volume of the left pleural effusion post thoracentesis. No evidence of the pneumothorax. Improved aeration in the lingula and left lower lobe, with mild atelectasis persisting. Stable small right pleural effusion and associated mild passive atelectasis in the right lower lobe. No new pulmonary parenchymal abnormalities. Stable marked cardiomegaly. Pulmonary venous hypertension without overt edema. IMPRESSION: 1. No pneumothorax after left thoracentesis. 2. Marked reduction in volume of the left pleural effusion. Improved aeration in the lingula and left lower lobe with mild atelectasis persisting. 3. Stable small right pleural effusion and associated mild passive atelectasis in the right lower lobe. 4. No new pulmonary parenchymal abnormalities. Electronically Signed   By: Hulan Saashomas  Lawrence M.D.   On: 10/01/2017 09:55   Dg Chest 2 View  Result Date: 09/30/2017 CLINICAL DATA:  Pleural effusion EXAM:  CHEST  2 VIEW COMPARISON:  09/25/2017, 10/27/ 2018 FINDINGS: Small right pleural effusion. Moderate left pleural effusion, increased compared to prior radiograph. Enlarged cardiomediastinal silhouette with aortic atherosclerosis. Progressive consolidation in the left lower lung. No pneumothorax. IMPRESSION: 1.  Bilateral pleural effusion, small on the right and moderate on the left, findings are increased on the left side compared to prior radiograph 2. Persistent consolidation in the lingula and left lung base 3. Cardiomegaly with vascular congestion Electronically Signed   By: Jasmine PangKim  Fujinaga M.D.   On: 09/30/2017 14:40   [place] Thoracentesis Asp Pleural Space W/img Guide  Result Date: 10/01/2017 INDICATION: Left pleural effusion EXAM: ULTRASOUND GUIDED LEFT THORACENTESIS MEDICATIONS: 1% lidocaine local COMPLICATIONS: None immediate. PROCEDURE: An ultrasound guided thoracentesis was thoroughly discussed with the patient's grandson and questions answered. The benefits, risks, alternatives and complications were also discussed. The patient understands and wishes to proceed with the procedure. Written consent was obtained. Ultrasound was performed to localize and mark an adequate pocket of fluid in the left chest. The area was then prepped and draped in the normal sterile fashion. 1% Lidocaine was used for local anesthesia. Under ultrasound guidance a 6 Fr Safe-T-Centesis catheter was introduced. Thoracentesis was performed. The catheter was removed and a dressing applied. FINDINGS: A total of approximately 825 cc of clear pleural fluid was removed. Samples were sent to the laboratory as requested by the clinical team. IMPRESSION: Successful ultrasound guided left thoracentesis yielding 825 cc of pleural fluid. Electronically Signed   By: Judie PetitM.  Shick M.D.   On: 10/01/2017 09:52    EKG:   Orders placed or performed during the hospital encounter of 09/25/17  . ED EKG  . ED EKG  . EKG 12-Lead  . EKG 12-Lead    ASSESSMENT AND PLAN:  *Acute hypoxic and hypercarbic respiratory failure.  Was on BiPAP.  Now on nasal cannula. S/p left thoracentesis  * Left-sided aspiration pneumonia. On augmentin. Afebrile  * AKI over CKD stage IV  No HD as per discussion with HCPOA grandson Seth by palliative care  * Chronic  systolic heart failure with EF 35%  * Severe Parkinson disease  * Diabetes mellitus type 2. Sliding scale insulin  D/C home with hospice with medical equipement set up at home  CODE STATUS: DNR  TOTAL TIME TAKING CARE OF THIS PATIENT: 35 minutes.   POSSIBLE D/C IN 1-2 DAYS, DEPENDING ON CLINICAL CONDITION.  Molinda BailiffSrikar R Emerita Berkemeier M.D on 10/02/2017 at 1:32 PM  Between 7am to 6pm - Pager - [phone]  After 6pm go to [email or web address] - password [email or web address] [facility]  [facility]  Office  [phone]  CC: Primary care physician; Lauro RegulusAnderson, Marshall W, MD   Note: This dictation was prepared with Dragon dictation along with smaller phrase technology. Any transcriptional errors that result from this process are unintentional.

## 2017-10-03 LAB — BASIC METABOLIC PANEL
ANION GAP: 4 — AB (ref 5–15)
BUN: 81 mg/dL — ABNORMAL HIGH (ref 6–20)
CALCIUM: 8 mg/dL — AB (ref 8.9–10.3)
CO2: 23 mmol/L (ref 22–32)
Chloride: 107 mmol/L (ref 101–111)
Creatinine, Ser: 2.7 mg/dL — ABNORMAL HIGH (ref 0.44–1.00)
GFR, EST AFRICAN AMERICAN: 18 mL/min — AB (ref >60)
GFR, EST NON AFRICAN AMERICAN: 16 mL/min — AB (ref >60)
Glucose, Bld: 356 mg/dL — ABNORMAL HIGH (ref 65–99)
Potassium: 5.4 mmol/L — ABNORMAL HIGH (ref 3.5–5.1)
Sodium: 134 mmol/L — ABNORMAL LOW (ref 135–145)

## 2017-10-03 NOTE — Discharge Instructions (Signed)
Heart Failure Clinic appointment on October 13 2017 at 11:40 am with Clarisa Kindredina Angeni Chaudhuri, FNP. Please call [phone] to reschedule.

## 2017-10-03 NOTE — Progress Notes (Signed)
Patients family not comfortable with patient going home tonight. Per Dr Elpidio AnisSudini, discontinued discharge order. Patient will go home tomorrow morning instead.

## 2017-10-03 NOTE — Progress Notes (Signed)
Pt complaining of SOB; Restlessness. Given Breathing Treatment; Ativan PO; After review of pt labs pt was found to have potassium of 5.5 on the 10/01/17. Dr. Tobi BastosPyreddy notified and orders received for BMP and telemetry monitor.

## 2017-10-03 NOTE — Care Management Note (Addendum)
Case Management Note  Patient Details  Name: Molly Wolf MRN: [id number] Date of Birth: 04/08/1941  Subjective/Objective:  [facility] delivery of a Hospital bed, new home 02, and a BSC to the home today. [facility] to accept the D[facility]. Burna MortimerWanda will advise us when DME is in place in the home. [facility] will provide in home hospice services.  A Medical Necessity form is in the chart for EMS transport when the home equipment is delivered today.                   Action/Plan:   Expected Discharge Date:  10/03/17               Expected Discharge Plan:  Home w Hospice Care  In-House Referral:     Discharge planning Services  CM Consult  Post Acute Care Choice:  Hospice, Durable Medical Equipment Choice offered to:  Adult Children  DME Arranged:  Hospital bed, Oxygen, Bedside commode DME Agency:  [facility]  HH Arranged:  (Hospice services) HH [facility]  [facility]  Status of Service:     If discussed at Long Length of Stay Meetings, dates discussed:    Additional Comments:  Tahiry Spicer A, RN 10/03/2017, 11:20 AM

## 2017-10-04 LAB — BODY FLUID CULTURE: CULTURE: NO GROWTH

## 2017-10-04 LAB — CYTOLOGY - NON PAP

## 2017-10-04 NOTE — Care Management Important Message (Signed)
Important Message  Patient Details  Name: Molly Wolf MRN: [id number] Date of Birth: November 10, 1940   Medicare Important Message Given:  Yes    Chapman FitchBOWEN, Oliana Gowens T, RN 10/04/2017, 11:31 AM

## 2017-10-04 NOTE — Progress Notes (Addendum)
[facility] [place] [facility]   PATIENT NAME: Molly CunasJudy Wolf    MR#:  [id number]  DATE OF BIRTH:  October 22, 1940    CHIEF COMPLAINT:   Chief Complaint  Patient presents with  . Respiratory Arrest   Awake and alert. Family at bedside  LE pain on and off REVIEW OF SYSTEMS:   Review of Systems  Unable to perform ROS: Mental status change   DRUG ALLERGIES:   Allergies  Allergen Reactions  . Prednisone Other (See Comments)    halluciantions    VITALS:  Blood pressure 117/79, pulse 93, temperature 97.8 F (36.6 C), resp. rate 16, height 5\' 6"  (1.676 m), weight 59 kg (130 lb), SpO2 99 %.  PHYSICAL EXAMINATION:  GENERAL:  76 y.o.-year-old patient lying in the bed with no acute distress.  Patient has continued shaking of hands and head secondary to Parkinson disease. EYES: Pupils equal, round, reactive to light and accommodation. No scleral icterus. Extraocular muscles intact. HEENT: Head atraumatic, normocephalic. Oropharynx and nasopharynx clear.  NECK:  Supple, no jugular venous distention. No thyroid enlargement, no tenderness.  LUNGS: Decreased breath sound bilaterally. rales CARDIOVASCULAR: S1, S2 normal. No murmurs, rubs, or gallops.  ABDOMEN: Soft, nontender, nondistended. Bowel sounds present. No organomegaly or mass. EXTREMITIES: No pedal edema, cyanosis, or clubbing. NEUROLOGIC:  Decreased muscle strength overall . tremors PSYCHIATRIC: The patient is drowzy. SKIN: No obvious rash, lesion, or ulcer.   LABORATORY PANEL:   CBC Recent Labs  Lab 10/01/17 0433  WBC 2.5*  HGB 13.9  HCT 43.8  PLT 143*   ------------------------------------------------------------------------------------------------------------------  Chemistries  Recent Labs  Lab 10/01/17 0433 10/03/17 0325  NA 138 134*  K 5.5* 5.4*  CL 111 107  CO2 17* 23  GLUCOSE 163* 356*  BUN 65* 81*  CREATININE 2.57* 2.70*  CALCIUM 8.3* 8.0*  MG 1.8  --     ------------------------------------------------------------------------------------------------------------------  Cardiac Enzymes No results for input(s): TROPONINI in the last 168 hours. ------------------------------------------------------------------------------------------------------------------  RADIOLOGY:  No results found.  EKG:   Orders placed or performed during the hospital encounter of 09/25/17  . ED EKG  . ED EKG  . EKG 12-Lead  . EKG 12-Lead    ASSESSMENT AND PLAN:  *Acute hypoxic and hypercarbic respiratory failure.  Was on BiPAP.  Now on nasal cannula. S/p left thoracentesis improved  * Left-sided aspiration pneumonia. Finished augmentin. Afebrile  * AKI over CKD stage IV  No HD as per discussion with HCPOA grandson Seth by palliative care  * Chronic systolic heart failure with EF 35%  * Wolf Parkinson disease  * Diabetes mellitus type 2. Sliding scale insulin  D/C home with hospice tomorrow  CODE STATUS: DNR  TOTAL TIME TAKING CARE OF THIS PATIENT: 35 minutes.   POSSIBLE D/C IN 1-2 DAYS, DEPENDING ON CLINICAL CONDITION.  Molinda BailiffSrikar R Enes Wegener M.D on 10/04/2017 at 8:00 AM  Between 7am to 6pm - Pager - [phone]  After 6pm go to [email or web address] - password [email or web address] [facility]  [facility]  Office  [phone]  CC: Primary care physician; Lauro RegulusAnderson, Marshall W, MD   Note: This dictation was prepared with Dragon dictation along with smaller phrase technology. Any transcriptional errors that result from this process are unintentional.

## 2017-10-04 NOTE — Progress Notes (Signed)
Patient is ready for discharge. AVS printed. Instructions provided to the family and all questions answered. Patient belongings gathered and sent with the daughter home. IV removed. Gown changed. Vitals taken. EMS called for non-emergent transport to grandsons home.

## 2017-10-04 NOTE — Progress Notes (Signed)
[facility]  ROUNDING NOTE   Subjective:   To be discharged today.   Objective:  Vital signs in last 24 hours:  Temp:  [97.8 F (36.6 C)-98.6 F (37 C)] 97.8 F (36.6 C) (12/24 0444) Pulse Rate:  [85-93] 93 (12/24 0444) Resp:  [15-17] 16 (12/24 0444) BP: (113-120)/(76-81) 117/79 (12/24 0444) SpO2:  [96 %-100 %] 99 % (12/24 0444)  Weight change:  Filed Weights   09/25/17 1549  Weight: 59 kg (130 lb)    Intake/Output: I/O last 3 completed shifts: In: 600 [P.O.:600] Out: -    Intake/Output this shift:  No intake/output data recorded.  Physical Exam: General: NAD, laying in bed  Head: Normocephalic, atraumatic. Moist oral mucosal membranes  Eyes: Anicteric, PERRL  Neck: Supple, trachea midline  Lungs:  Clear to auscultation  Heart: irregular  Abdomen:  Soft, nontender  Extremities: no peripheral edema.  Neurologic: Alert to self only, +tremor  Skin: No lesions  Access: none    Basic Metabolic Panel: Recent Labs  Lab 09/28/17 0429 09/29/17 0613 09/30/17 0419 10/01/17 0433 10/03/17 0325  NA 140 138 139 138 134*  K 5.0 5.3* 5.0 5.5* 5.4*  CL 113* 112* 113* 111 107  CO2 20* 19* 19* 17* 23  GLUCOSE 115* 170* 143* 163* 356*  BUN 55* 60* 64* 65* 81*  CREATININE 2.58* 2.73* 2.45* 2.57* 2.70*  CALCIUM 8.3* 8.6* 8.5* 8.3* 8.0*  MG 1.8  --   --  1.8  --   PHOS 4.3  --   --  4.8*  --     Liver Function Tests: No results for input(s): AST, ALT, ALKPHOS, BILITOT, PROT, ALBUMIN in the last 168 hours. No results for input(s): LIPASE, AMYLASE in the last 168 hours. No results for input(s): AMMONIA in the last 168 hours.  CBC: Recent Labs  Lab 10/01/17 0433  WBC 2.5*  NEUTROABS 2.1  HGB 13.9  HCT 43.8  MCV 100.6*  PLT 143*    Cardiac Enzymes: No results for input(s): CKTOTAL, CKMB, CKMBINDEX, TROPONINI in the last 168 hours.  BNP: Invalid input(s): POCBNP  CBG: Recent Labs  Lab 10/01/17 1158 10/01/17 1725 10/02/17 0006  10/02/17 0544 10/02/17 1142  GLUCAP 204* 234* 259* 191* 186*    Microbiology: Results for orders placed or performed during the hospital encounter of 09/25/17  Culture, blood (routine x 2)     Status: None   Collection Time: 09/25/17  3:45 PM  Result Value Ref Range Status   Specimen Description BLOOD RIGHT ANTECUBITAL  Final   Special Requests   Final    BOTTLES DRAWN AEROBIC AND ANAEROBIC Blood Culture adequate volume   Culture NO GROWTH 5 DAYS  Final   Report Status 09/30/2017 FINAL  Final  Culture, blood (routine x 2)     Status: None   Collection Time: 09/25/17  4:03 PM  Result Value Ref Range Status   Specimen Description BLOOD BLOOD LEFT FOREARM  Final   Special Requests   Final    BOTTLES DRAWN AEROBIC AND ANAEROBIC Blood Culture results may not be optimal due to an inadequate volume of blood received in culture bottles   Culture NO GROWTH 5 DAYS  Final   Report Status 09/30/2017 FINAL  Final  CULTURE, BLOOD (ROUTINE X 2) w Reflex to ID Panel     Status: None   Collection Time: 09/25/17  7:26 PM  Result Value Ref Range Status   Specimen Description BLOOD RIGHT HAND  Final   Special  Requests   Final    BOTTLES DRAWN AEROBIC AND ANAEROBIC Blood Culture adequate volume   Culture NO GROWTH 5 DAYS  Final   Report Status 09/30/2017 FINAL  Final  CULTURE, BLOOD (ROUTINE X 2) w Reflex to ID Panel     Status: None   Collection Time: 09/25/17  7:39 PM  Result Value Ref Range Status   Specimen Description BLOOD LEFT ANTECUBITAL  Final   Special Requests   Final    BOTTLES DRAWN AEROBIC AND ANAEROBIC Blood Culture adequate volume   Culture NO GROWTH 5 DAYS  Final   Report Status 09/30/2017 FINAL  Final  Urine culture     Status: None   Collection Time: 09/25/17  7:41 PM  Result Value Ref Range Status   Specimen Description URINE, RANDOM  Final   Special Requests NONE  Final   Culture   Final    NO GROWTH Performed at [facility], [address], [place], [place]  [address]    Report Status 09/27/2017 FINAL  Final  MRSA PCR Screening     Status: None   Collection Time: 09/25/17  7:41 PM  Result Value Ref Range Status   MRSA by PCR NEGATIVE NEGATIVE Final    Comment:        The GeneXpert MRSA Assay (FDA approved for NASAL specimens only), is one component of a comprehensive MRSA colonization surveillance program. It is not intended to diagnose MRSA infection nor to guide or monitor treatment for MRSA infections.   Gastrointestinal Panel by PCR , Stool     Status: None   Collection Time: 09/25/17  7:41 PM  Result Value Ref Range Status   Campylobacter species NOT DETECTED NOT DETECTED Final   Plesimonas shigelloides NOT DETECTED NOT DETECTED Final   Salmonella species NOT DETECTED NOT DETECTED Final   Yersinia enterocolitica NOT DETECTED NOT DETECTED Final   Vibrio species NOT DETECTED NOT DETECTED Final   Vibrio cholerae NOT DETECTED NOT DETECTED Final   Enteroaggregative E coli (EAEC) NOT DETECTED NOT DETECTED Final   Enteropathogenic E coli (EPEC) NOT DETECTED NOT DETECTED Final   Enterotoxigenic E coli (ETEC) NOT DETECTED NOT DETECTED Final   Shiga like toxin producing E coli (STEC) NOT DETECTED NOT DETECTED Final   Shigella/Enteroinvasive E coli (EIEC) NOT DETECTED NOT DETECTED Final   Cryptosporidium NOT DETECTED NOT DETECTED Final   Cyclospora cayetanensis NOT DETECTED NOT DETECTED Final   Entamoeba histolytica NOT DETECTED NOT DETECTED Final   Giardia lamblia NOT DETECTED NOT DETECTED Final   Adenovirus F40/41 NOT DETECTED NOT DETECTED Final   Astrovirus NOT DETECTED NOT DETECTED Final   Norovirus GI/GII NOT DETECTED NOT DETECTED Final   Rotavirus A NOT DETECTED NOT DETECTED Final   Sapovirus (I, II, IV, and V) NOT DETECTED NOT DETECTED Final  C difficile quick scan w PCR reflex     Status: None   Collection Time: 09/25/17  7:41 PM  Result Value Ref Range Status   C Diff antigen NEGATIVE NEGATIVE Final   C Diff toxin NEGATIVE  NEGATIVE Final   C Diff interpretation No C. difficile detected.  Final  Body fluid culture     Status: None (Preliminary result)   Collection Time: 10/01/17  9:25 AM  Result Value Ref Range Status   Specimen Description   Final    PLEURAL Performed at [facility], [address], [place], [place] [address]    Special Requests   Final  NONE Performed at [facility], [address] [place], [place] [address]    Gram Stain   Final    WBC PRESENT,BOTH PMN AND MONONUCLEAR NO ORGANISMS SEEN CYTOSPIN SMEAR    Culture   Final    NO GROWTH 3 DAYS Performed at [facility], [address] [place], [place] [address]    Report Status PENDING  Incomplete    Coagulation Studies: No results for input(s): LABPROT, INR in the last 72 hours.  Urinalysis: No results for input(s): COLORURINE, LABSPEC, PHURINE, GLUCOSEU, HGBUR, BILIRUBINUR, KETONESUR, PROTEINUR, UROBILINOGEN, NITRITE, LEUKOCYTESUR in the last 72 hours.  Invalid input(s): APPERANCEUR    Imaging: No results found.   Medications:    . feeding supplement (NEPRO CARB STEADY)  237 mL Oral BID BM  . furosemide  40 mg Oral Daily  . ipratropium-albuterol  3 mL Nebulization TID  . metoprolol tartrate  25 mg Oral BID  . sulfacetamide  1 drop Left Eye Q4H   acetaminophen, ipratropium-albuterol, LORazepam, morphine injection, morphine CONCENTRATE, nitroGLYCERIN, [DISCONTINUED] ondansetron **OR** ondansetron (ZOFRAN) IV, polyethylene glycol  Assessment/ Plan:  [name] is a 76 y.o. white female with hypertension, hyperlipidemia, Parkinson's, DVT, Diabetes mellitus type II, Depression, Coronary artery disease, COPD, Anxiety, Osteoarthritis   1.  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 GFR of 17-25. Creatinine does fluctuate with volume status.  Required hemodialysis in the past.  Monitor volume status closely.  -  low potassium diet.   2.  Acute respiratory failure secondary to pneumonia. On 2 L [place] Oxygen. Afebrile, no leukocytosis -  PO Augmentin - Supportive care.   3. Hypertension with systolic congestive heart failure: EF of 30% on echocardiogram.   Blood pressure at goal.  - restart furosemide 40mg  PO daily  4.  Diabetes mellitus type 2 with chronic kidney disease: Continue glucose control.   Appreciate Palliative care input.    LOS: 9 Rehaan Viloria 12/24/201810:14 AM

## 2017-10-04 NOTE — Care Management Note (Signed)
Case Management Note    Patient Details  Name: Renella CunasJudy Capito MRN: [id number] Date of Birth: 29-Mar-1941   Patient to discharge today.  Hospice contacted at [phone].  Triage nurse notified.  Confirmed equipment was delivered yesterday. Contact information provided to daughter who is at bedside.  Family to contact triage nurse after patient gets home today.   RNCM signing off.   Subjective/Objective:                    Action/Plan:   Expected Discharge Date:  10/04/17               Expected Discharge Plan:  Home w Hospice Care  In-House Referral:     Discharge planning Services  CM Consult  Post Acute Care Choice:  Hospice, Durable Medical Equipment Choice offered to:  Adult Children  DME Arranged:  Hospital bed, Oxygen, Bedside commode DME [facility]  [facility]  HH Arranged:  (Hospice services) HH Agency:  [facility]  Status of Service:  Completed, signed off  If discussed at Long Length of Stay Meetings, dates discussed:    Additional Comments:  Chapman FitchBOWEN, Gibbs Naugle T, RN 10/04/2017, 11:33 AM

## 2017-10-13 ENCOUNTER — Ambulatory Visit: Payer: Medicare Other | Admitting: Family

## 2017-10-13 ENCOUNTER — Telehealth: Payer: Self-pay | Admitting: Family

## 2017-10-13 NOTE — Progress Notes (Deleted)
   Patient ID: Molly Wolf, female    DOB: 1941-02-22, 77 y.o.   MRN: [id number]  HPI  Molly Wolf is a 77 y/o female with a history of   Review of Systems    Physical Exam

## 2017-10-13 NOTE — Telephone Encounter (Signed)
Patient did not show for her Heart Failure Clinic appointment on 10/13/17. Will attempt to reschedule.

## 2017-10-18 NOTE — Discharge Summary (Signed)
[employer] - [place] [facility]   PATIENT NAME: Molly Wolf    MR#:  [id number]  DATE OF BIRTH:  1940-11-13  DATE OF ADMISSION:  09/25/2017 ADMITTING PHYSICIAN: Enid Baasadhika Kalisetti, MD  DATE OF DISCHARGE: 10/04/2017  4:07 PM  PRIMARY CARE PHYSICIAN: Lauro RegulusAnderson, Marshall W, MD   ADMISSION DIAGNOSIS:  SOB (shortness of breath) [R06.02] Acute respiratory failure with hypoxia and hypercapnia (HCC) [J96.01, J96.02] Aspiration pneumonia of left lung, unspecified aspiration pneumonia type, unspecified part of lung (HCC) [J69.0] Acute respiratory failure (HCC) [J96.00]  DISCHARGE DIAGNOSIS:  Active Problems:   Acute respiratory failure (HCC)   Parkinson's disease (HCC)   Advance care planning   Palliative care by specialist   Protein-calorie malnutrition, severe   Pleural effusion on left   SOB (shortness of breath)   SECONDARY DIAGNOSIS:   Past Medical History:  Diagnosis Date  . Anxiety   . Arthritis   . Chronic combined systolic and diastolic CHF (congestive heart failure) (HCC)    a. 01/2008 Echo ([facility]): EF > 55%, mod-sev LVH w/ grade 1 DD, biatrial enlargement, mild AS, trace MR/TR; b. EF 45-50%, GR2DD, mild to moderate aortic stenosis, mild aortic insufficieny, mild to moderate mitral regurgitation, mild tricuspid regurgitation, mild biatrial enlargement, and a small pericardial effusion  . CKD (chronic kidney disease), stage IV (HCC)   . Closed patellar sleeve fracture of right knee    a. 01/2017 - conservatively managed.  [facility] Kitchen. COPD (chronic obstructive pulmonary disease) (HCC)   . Coronary artery disease    a. s/p remote stenting of unknown vessel @ [facility].  . Depression   . Diabetes mellitus with complication (HCC)   . DVT (deep venous thrombosis) (HCC)    a. in the setting of right patellar fracture on Coumadin  . Hyperlipidemia   . Hypertension   . Parkinson disease (HCC)   . Renal insufficiency      ADMITTING HISTORY  HISTORY OF PRESENT ILLNESS:   Molly Wolf  is a [age] y.o. female with a known history of CK D stage IV who has recently come off of temporary dialysis that was initiated few months ago, COPD not on home oxygen, coronary artery disease, diabetes mellitus, atrial fibrillation not on anticoagulation due to risk of falls, combined congestive heart failure, hypertension, Parkinson's disease presents to the hospital secondary to acute respiratory distress, possible aspiration. Most of the history is obtained from her daughter at bedside. Patient at baseline is non-ambulated lead lately using a wheelchair. Lives at home with her grandson. She has some trouble swallowing, mostly slow swallowing. Family denies any history of overt aspiration episodes. She has been lately having trouble breathing when was started on inhalers. This afternoon patient had still food in her mouth while she tried to use her Spiriva inhaler and had a possible aspiration episode with hypoxia, cyanosis and difficulty breathing. EMS was called and patient was brought to the emergency room. Initiation of BiPAP was attempted however patient had another vomiting episode here. Initial ABG shows respiratory acidosis with pH of 7.17. Patient denies any nausea now. No fevers or chills. Heart rate is slightly elevated and respiratory rate is going up to the 30s with minimal exertion. Patient is being admitted to stepdown unit.  HOSPITAL COURSE:   *Acute hypoxic and hypercarbic respiratory failure.  Was on BiPAP. Transitioned to nasal cannula. S/p left thoracentesis with no significant improvement Due to poor response to aggressive treatment, worsening renal function and fluid overload family after discussing with palliative care  have decided to take patient home with hospice services for end of life care  * Left-sided aspiration pneumonia. Finished augmentin. Afebrile  * AKI over CKD stage IV  No HD as per discussion with HCPOA grandson Seth by palliative care  *  Chronic systolic heart failure with EF 35%  * Severe Parkinson disease  * Diabetes mellitus type 2. Sliding scale insulin   Patient has poor prognosis and high risk for deterioration and death. D/C home with hospice for end of life care.   CONSULTS OBTAINED:  Treatment Team:  Mady Haagensen, MD  DRUG ALLERGIES:   Allergies  Allergen Reactions  . Prednisone Other (See Comments)    halluciantions    DISCHARGE MEDICATIONS:   Allergies as of 10/04/2017      Reactions   Prednisone Other (See Comments)   halluciantions      Medication List    STOP taking these medications   aspirin EC 81 MG tablet   atorvastatin 20 MG tablet Commonly known as:  LIPITOR   CoQ10 50 MG Caps   Cranberry 450 MG Tabs   Fish Oil 1000 MG Caps   gabapentin 300 MG capsule Commonly known as:  NEURONTIN   loratadine 10 MG tablet Commonly known as:  CLARITIN   multivitamin Tabs tablet   nitroGLYCERIN 0.4 MG SL tablet Commonly known as:  NITROSTAT   saccharomyces boulardii 250 MG capsule Commonly known as:  FLORASTOR   traMADol 50 MG tablet Commonly known as:  ULTRAM     TAKE these medications   acetaminophen 325 MG tablet Commonly known as:  TYLENOL Take 650 mg by mouth daily.   Carbidopa-Levodopa ER 25-100 MG tablet controlled release Commonly known as:  SINEMET CR Take 2 tablets by mouth 4 (four) times daily. What changed:  when to take this   docusate sodium 100 MG capsule Commonly known as:  COLACE Take 1 capsule (100 mg total) by mouth 2 (two) times daily. What changed:  when to take this   escitalopram 20 MG tablet Commonly known as:  LEXAPRO Take 20 mg by mouth daily.   feeding supplement (NEPRO CARB STEADY) Liqd Take 237 mLs by mouth 3 (three) times daily between meals.   furosemide 40 MG tablet Commonly known as:  LASIX Take 1 tablet (40 mg total) by mouth daily.   LORazepam 0.5 MG tablet Commonly known as:  ATIVAN Take 1 tablet (0.5 mg total) by  mouth every 8 (eight) hours as needed for anxiety or sleep. What changed:    when to take this  reasons to take this   morphine CONCENTRATE 10 MG/0.5ML Soln concentrated solution Place 0.25 mLs (5 mg total) under the tongue every hour as needed for moderate pain, severe pain or shortness of breath.   MYRBETRIQ 25 MG Tb24 tablet Generic drug:  mirabegron ER Take 25 mg by mouth daily.   ondansetron 4 MG disintegrating tablet Commonly known as:  ZOFRAN ODT Take 1 tablet (4 mg total) by mouth every 8 (eight) hours as needed for nausea or vomiting.   polyethylene glycol packet Commonly known as:  MIRALAX / GLYCOLAX Take 17 g by mouth daily as needed for mild constipation.   promethazine 25 MG tablet Commonly known as:  PHENERGAN Take 25 mg by mouth every 6 (six) hours as needed for nausea or vomiting.       Today   VITAL SIGNS:  Blood pressure 124/88, pulse (!) 125, temperature (!) 97.5 F (36.4 C), temperature source Oral, resp.  rate 16, height 5\' 6"  (1.676 m), weight 59 kg (130 lb), SpO2 100 %.  I/O:  No intake or output data in the 24 hours ending 10/18/17 1538  PHYSICAL EXAMINATION:  Physical Exam  GENERAL:  78 y.o.-year-old patient lying in the bed with no acute distress.  LUNGS: Normal breath sounds bilaterally, no wheezing, rales,rhonchi or crepitation. No use of accessory muscles of respiration.  CARDIOVASCULAR: S1, S2 normal. No murmurs, rubs, or gallops.  ABDOMEN: Soft, non-tender, non-distended. Bowel sounds present. No organomegaly or mass.  NEUROLOGIC: Moves all 4 extremities. PSYCHIATRIC: The patient is alert and awake  DATA REVIEW:   CBC No results for input(s): WBC, HGB, HCT, PLT in the last 168 hours.  Chemistries  No results for input(s): NA, K, CL, CO2, GLUCOSE, BUN, CREATININE, CALCIUM, MG, AST, ALT, ALKPHOS, BILITOT in the last 168 hours.  Invalid input(s): GFRCGP  Cardiac Enzymes No results for input(s): TROPONINI in the last 168  hours.  Microbiology Results  Results for orders placed or performed during the hospital encounter of 09/25/17  Culture, blood (routine x 2)     Status: None   Collection Time: 09/25/17  3:45 PM  Result Value Ref Range Status   Specimen Description BLOOD RIGHT ANTECUBITAL  Final   Special Requests   Final    BOTTLES DRAWN AEROBIC AND ANAEROBIC Blood Culture adequate volume   Culture NO GROWTH 5 DAYS  Final   Report Status 09/30/2017 FINAL  Final  Culture, blood (routine x 2)     Status: None   Collection Time: 09/25/17  4:03 PM  Result Value Ref Range Status   Specimen Description BLOOD BLOOD LEFT FOREARM  Final   Special Requests   Final    BOTTLES DRAWN AEROBIC AND ANAEROBIC Blood Culture results may not be optimal due to an inadequate volume of blood received in culture bottles   Culture NO GROWTH 5 DAYS  Final   Report Status 09/30/2017 FINAL  Final  CULTURE, BLOOD (ROUTINE X 2) w Reflex to ID Panel     Status: None   Collection Time: 09/25/17  7:26 PM  Result Value Ref Range Status   Specimen Description BLOOD RIGHT HAND  Final   Special Requests   Final    BOTTLES DRAWN AEROBIC AND ANAEROBIC Blood Culture adequate volume   Culture NO GROWTH 5 DAYS  Final   Report Status 09/30/2017 FINAL  Final  CULTURE, BLOOD (ROUTINE X 2) w Reflex to ID Panel     Status: None   Collection Time: 09/25/17  7:39 PM  Result Value Ref Range Status   Specimen Description BLOOD LEFT ANTECUBITAL  Final   Special Requests   Final    BOTTLES DRAWN AEROBIC AND ANAEROBIC Blood Culture adequate volume   Culture NO GROWTH 5 DAYS  Final   Report Status 09/30/2017 FINAL  Final  Urine culture     Status: None   Collection Time: 09/25/17  7:41 PM  Result Value Ref Range Status   Specimen Description URINE, RANDOM  Final   Special Requests NONE  Final   Culture   Final    NO GROWTH Performed at [facility], [address], [place], [place] [address]    Report Status 09/27/2017 FINAL  Final   MRSA PCR Screening     Status: None   Collection Time: 09/25/17  7:41 PM  Result Value Ref Range Status   MRSA by PCR NEGATIVE NEGATIVE Final    Comment:  The GeneXpert MRSA Assay (FDA approved for NASAL specimens only), is one component of a comprehensive MRSA colonization surveillance program. It is not intended to diagnose MRSA infection nor to guide or monitor treatment for MRSA infections.   Gastrointestinal Panel by PCR , Stool     Status: None   Collection Time: 09/25/17  7:41 PM  Result Value Ref Range Status   Campylobacter species NOT DETECTED NOT DETECTED Final   Plesimonas shigelloides NOT DETECTED NOT DETECTED Final   Salmonella species NOT DETECTED NOT DETECTED Final   Yersinia enterocolitica NOT DETECTED NOT DETECTED Final   Vibrio species NOT DETECTED NOT DETECTED Final   Vibrio cholerae NOT DETECTED NOT DETECTED Final   Enteroaggregative E coli (EAEC) NOT DETECTED NOT DETECTED Final   Enteropathogenic E coli (EPEC) NOT DETECTED NOT DETECTED Final   Enterotoxigenic E coli (ETEC) NOT DETECTED NOT DETECTED Final   Shiga like toxin producing E coli (STEC) NOT DETECTED NOT DETECTED Final   Shigella/Enteroinvasive E coli (EIEC) NOT DETECTED NOT DETECTED Final   Cryptosporidium NOT DETECTED NOT DETECTED Final   Cyclospora cayetanensis NOT DETECTED NOT DETECTED Final   Entamoeba histolytica NOT DETECTED NOT DETECTED Final   Giardia lamblia NOT DETECTED NOT DETECTED Final   Adenovirus F40/41 NOT DETECTED NOT DETECTED Final   Astrovirus NOT DETECTED NOT DETECTED Final   Norovirus GI/GII NOT DETECTED NOT DETECTED Final   Rotavirus A NOT DETECTED NOT DETECTED Final   Sapovirus (I, II, IV, and V) NOT DETECTED NOT DETECTED Final  C difficile quick scan w PCR reflex     Status: None   Collection Time: 09/25/17  7:41 PM  Result Value Ref Range Status   C Diff antigen NEGATIVE NEGATIVE Final   C Diff toxin NEGATIVE NEGATIVE Final   C Diff interpretation No C.  difficile detected.  Final  Body fluid culture     Status: None   Collection Time: 10/01/17  9:25 AM  Result Value Ref Range Status   Specimen Description   Final    PLEURAL Performed at [facility], [address], [place], [place] [address]    Special Requests   Final    NONE Performed at [facility], [address] [place], [place] [address]    Gram Stain   Final    WBC PRESENT,BOTH PMN AND MONONUCLEAR NO ORGANISMS SEEN CYTOSPIN SMEAR    Culture   Final    No growth aerobically or anaerobically. Performed at [facility], [address], [place], [place] [address]    Report Status 10/04/2017 FINAL  Final    RADIOLOGY:  No results found.  Follow up with PCP in 1 week.  Management plans discussed with the patient, family and they are in agreement.  CODE STATUS:  Code Status History    Date Active Date Inactive Code Status Order ID Comments User Context   09/25/2017 22:21 10/04/2017 19:12 DNR [id number]  Cristy Hilts, MD Inpatient   09/25/2017 22:18 09/25/2017 22:21 DNR [id number]  Cristy Hilts, MD Inpatient   09/25/2017 21:37 09/25/2017 22:18 DNR [id number]  Cristy Hilts, MD Inpatient   09/25/2017 21:37 09/25/2017 21:37 DNR [id number]  Cristy Hilts, MD Inpatient   09/25/2017 19:45 09/25/2017 21:37 Full Code [id number]  Lewie Loron, NP Inpatient   09/25/2017 17:14 09/25/2017 19:45 Partial Code [id number]  Enid Baas, MD ED   08/05/2017 14:15 08/08/2017 21:04 DNR [id number]  Adrian Saran, MD Inpatient   07/02/2017 17:09 07/03/2017 23:13 Full Code [id number]  Gouru,  Deanna Artis, MD Inpatient   04/10/2017 03:27 04/27/2017 21:50 Full Code [id number]  Arnaldo Natal, MD ED   04/03/2017 23:30 04/09/2017 22:09 Full Code [id number]  Tonye Royalty, DO ED   02/15/2017 18:16 02/16/2017 18:18 Full Code [id number]  Altamese Dilling, MD Inpatient   01/26/2017 14:44 01/29/2017 21:47 Full Code [id number]  Alford Highland, MD ED   12/28/2016 07:44 12/29/2016  16:34 DNR [id number]  Delfino Lovett, MD Inpatient   12/27/2016 23:06 12/28/2016 07:44 Full Code [id number]  Hugelmeyer, Jon Gills, DO Inpatient    Questions for Most Recent Historical Code Status (Order [id number])    Question Answer Comment   In the event of cardiac or respiratory ARREST Do not call a "code blue"    In the event of cardiac or respiratory ARREST Do not perform Intubation, CPR, defibrillation or ACLS    In the event of cardiac or respiratory ARREST Use medication by any route, position, wound care, and other measures to relive pain and suffering. May use oxygen, suction and manual treatment of airway obstruction as needed for comfort.    Comments No BiPAP, No vasopressors, No escalation of care, No ABG, no NG tube, no invasive procedures or central lines.       TOTAL TIME TAKING CARE OF THIS PATIENT ON DAY OF DISCHARGE: more than 30 minutes.   Molinda Bailiff Tannia Contino M.D on 10/18/2017 at 3:38 PM  Between 7am to 6pm - Pager - [phone]  After 6pm go to [email or web address] - password [email or web address] [facility]  [facility] [place] [facility]  Office  [phone]  CC: Primary care physician; Lauro Regulus, MD  Note: This dictation was prepared with Dragon dictation along with smaller phrase technology. Any transcriptional errors that result from this process are unintentional.

## 2017-10-19 LAB — COMP PANEL: LEUKEMIA/LYMPHOMA

## 2017-11-12 DEATH — deceased

## 2018-12-12 IMAGING — CT CT HEAD W/O CM
3 of 4 series · 13 of 47 positions shown, 15 images · non-contrast
Comparison: None.

CLINICAL DATA: Status post fall. Hit back of head on floor. Large
laceration at the back of the head. Concern for cervical spine
injury. Initial encounter.

EXAM:
CT HEAD WITHOUT CONTRAST
CT CERVICAL SPINE WITHOUT CONTRAST
TECHNIQUE: Multidetector CT imaging of the head and cervical spine was
performed following the standard protocol without intravenous
contrast. Multiplanar CT image reconstructions of the cervical spine
were also generated.

[Series 3: head without · axial · non-contrast · 0.41mm/px · z∈[-166,-46]mm · 7 of 32 slices shown, 9 images]
[im 4/32  brain]
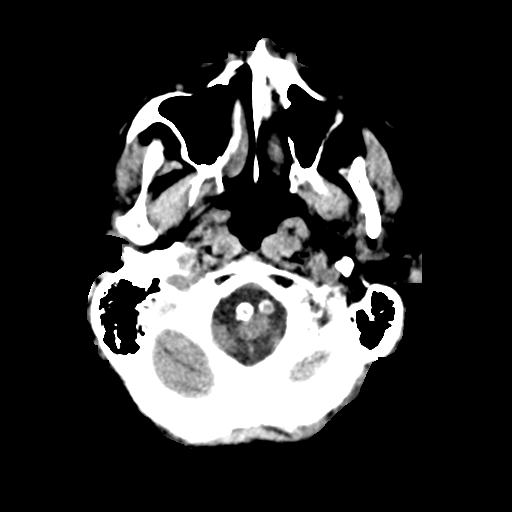
[im 4/32  bone]
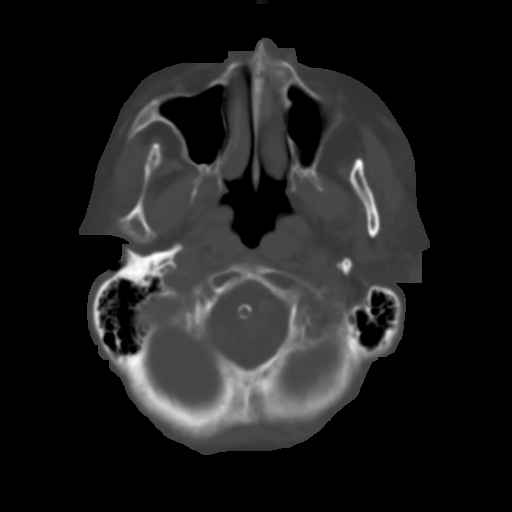
[im 8/32  brain]
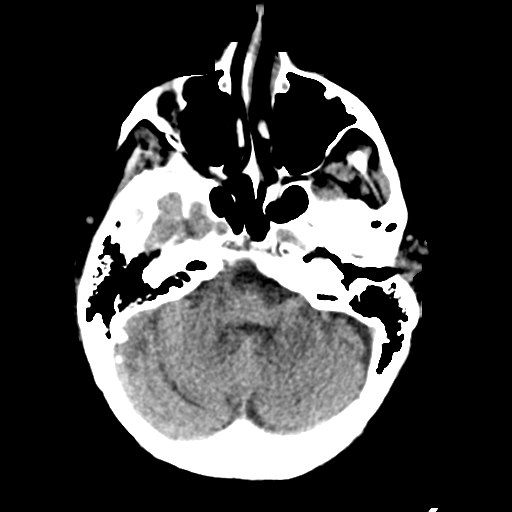
[im 12/32  brain]
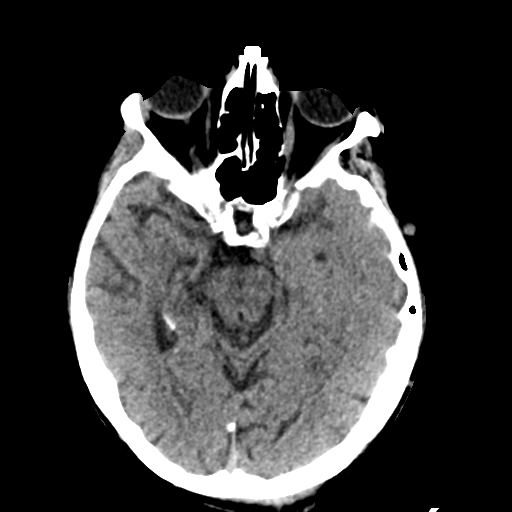
[im 16/32  brain]
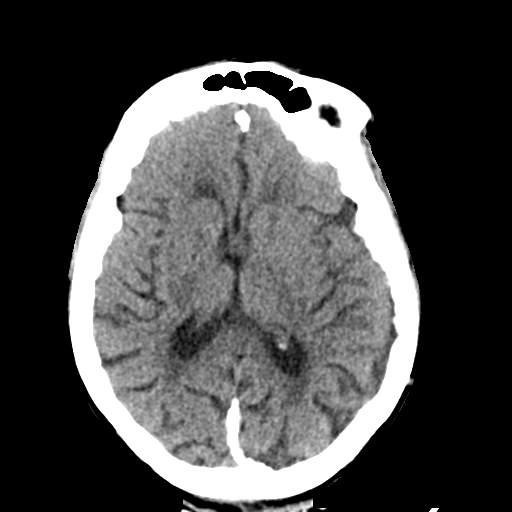
[im 20/32  brain]
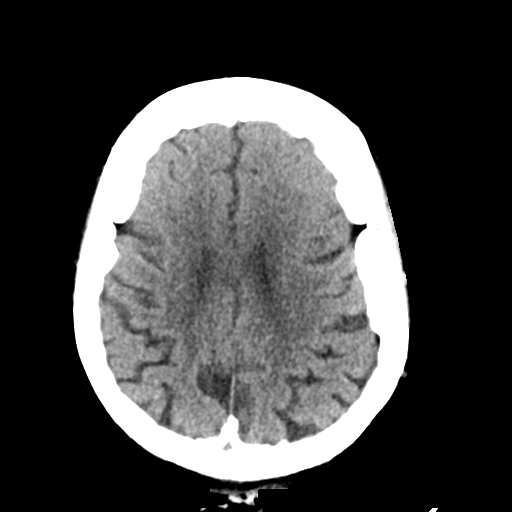
[im 20/32  bone]
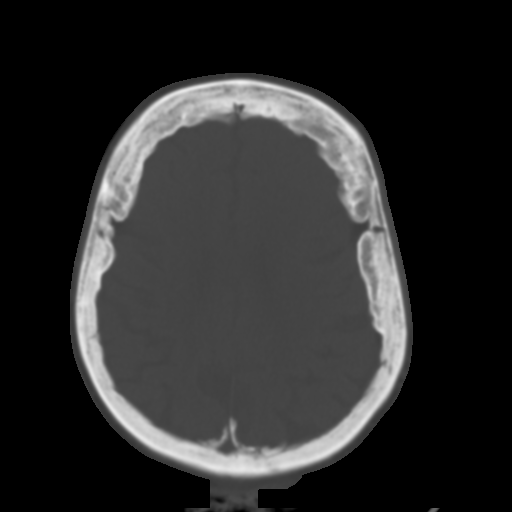
[im 24/32  brain]
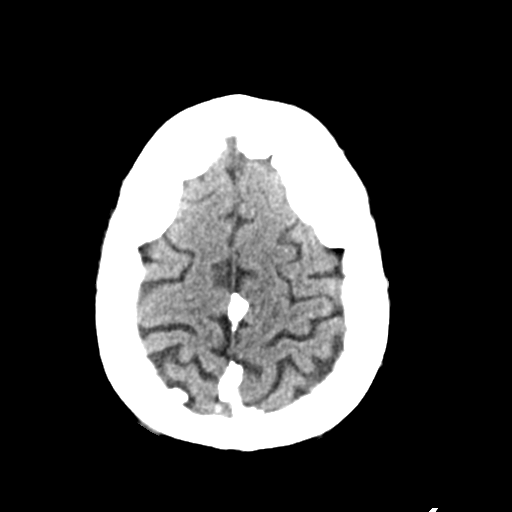
[im 28/32  brain]
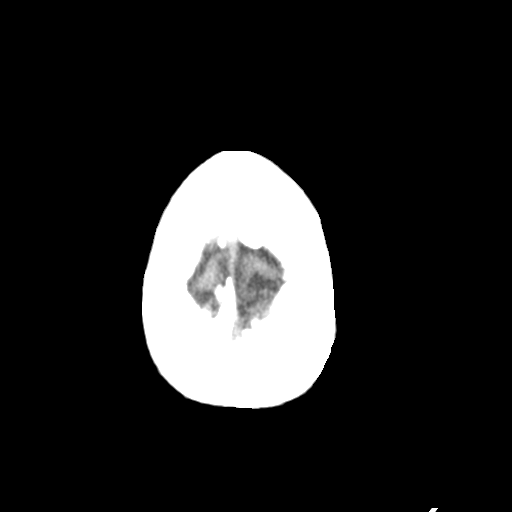

[Series 4: head without cor · coronal · non-contrast · 0.29mm/px · 3 of 68 slices shown]
[im 23/68  brain]
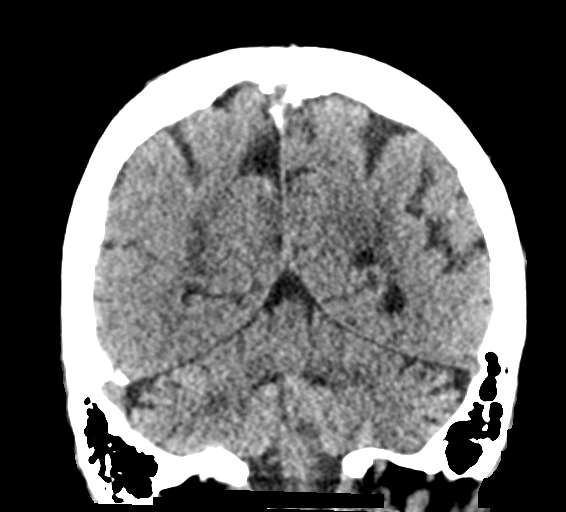
[im 30/68  brain]
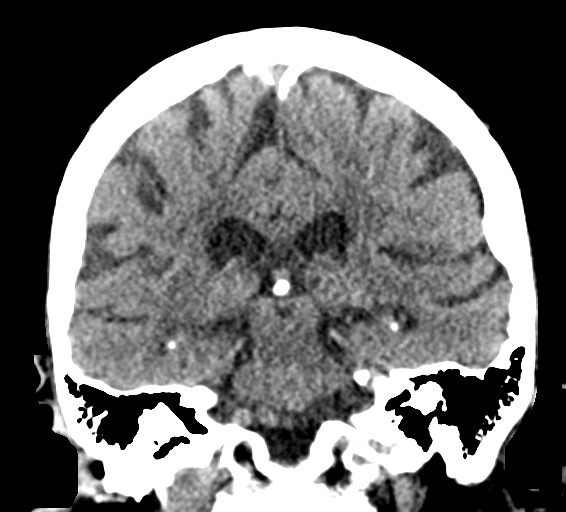
[im 38/68  brain]
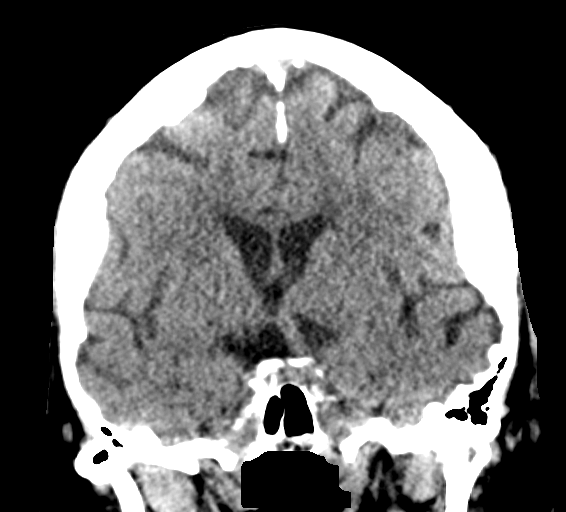

[Series 5: head without sag · sagittal · non-contrast · 0.30mm/px · 3 of 51 slices shown]
[im 17/51  brain]
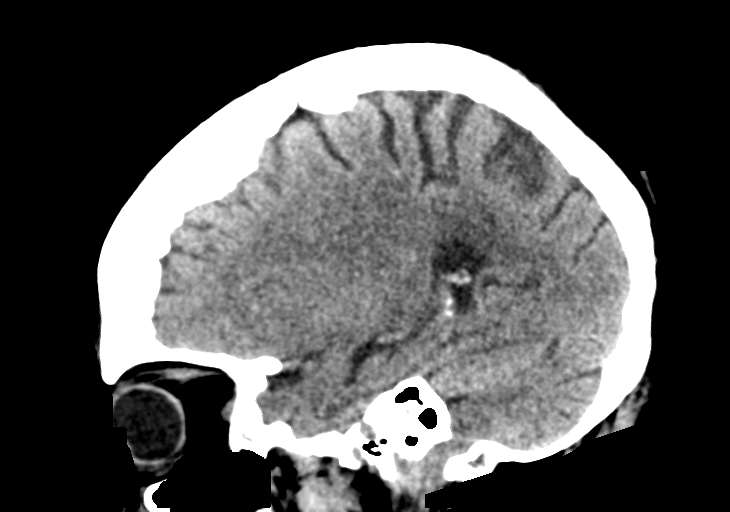
[im 26/51  brain]
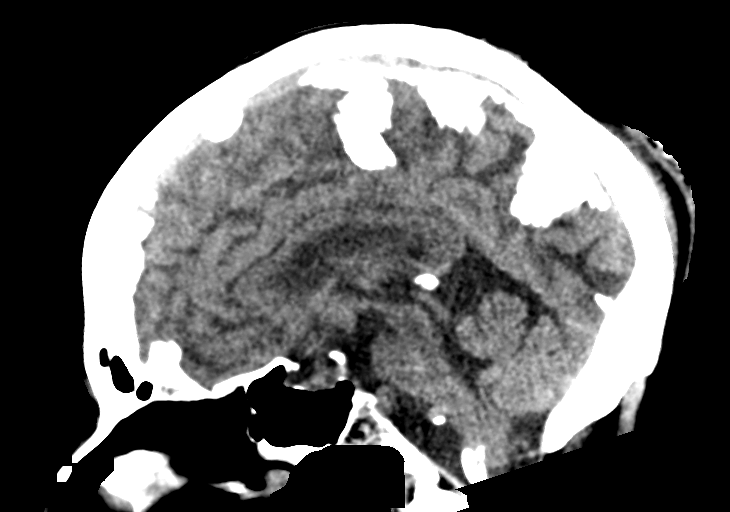
[im 34/51  brain]
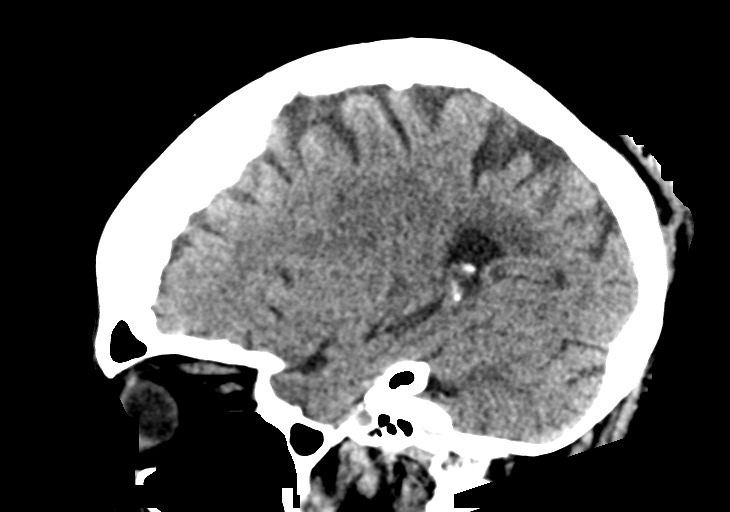

[13 of 47 positions shown; findings below may reference images not displayed]

FINDINGS: CT HEAD FINDINGS

Brain: No evidence of acute infarction, hemorrhage, hydrocephalus,
extra-axial collection or mass lesion/mass effect.

Prominence of the ventricles and sulci reflects mild cortical volume
loss. Scattered periventricular and subcortical white matter change
likely reflects small vessel ischemic microangiopathy.

The brainstem and fourth ventricle are within normal limits. The
basal ganglia are unremarkable in appearance. The cerebral
hemispheres demonstrate grossly normal gray-white differentiation.
No mass effect or midline shift is seen.

Vascular: No hyperdense vessel or unexpected calcification.

Skull: There is no evidence of fracture; visualized osseous
structures are unremarkable in appearance.

Sinuses/Orbits: The orbits are within normal limits. The paranasal
sinuses and mastoid air cells are well-aerated.

Other: A soft tissue laceration is noted at the occiput, with
associated soft tissue swelling.

CT CERVICAL SPINE FINDINGS

Alignment: Normal.

Skull base and vertebrae: No acute fracture. No primary bone lesion
or focal pathologic process.

Soft tissues and spinal canal: No prevertebral fluid or swelling. No
visible canal hematoma.

Disc levels: Multiple disc space narrowing is noted along the lower
cervical spine, with associated anterior and posterior disc
osteophyte complexes. Mild degenerative change is noted about the
dens. There is mild chronic deformity at vertebral body C4,
reflecting remote injury.

Upper chest: Calcification is seen at the carotid bifurcations
bilaterally. The visualized lung apices are grossly clear. The
thyroid gland is grossly unremarkable in appearance.

Other: No additional soft tissue abnormalities are seen.
IMPRESSION: 1. No evidence of traumatic intracranial injury or fracture.
2. No evidence of fracture or subluxation along the cervical spine.
3. Soft tissue laceration at the occiput, with associated soft
tissue swelling.
4. Mild cortical volume loss and scattered small vessel ischemic
microangiopathy.
5. Degenerative change along the lower cervical spine. Mild chronic
deformity of vertebral body C4, reflecting remote injury.
6. Calcification at the carotid bifurcations bilaterally. Carotid
ultrasound would be helpful for further evaluation, when and as
deemed clinically appropriate.
